# Patient Record
Sex: Male | Born: 1935
Health system: Southern US, Community
[De-identification: ages and names within clinical notes are randomized; demographics above are authoritative.]

## PROBLEM LIST (undated history)

## (undated) DIAGNOSIS — E785 Hyperlipidemia, unspecified: Secondary | ICD-10-CM

## (undated) DIAGNOSIS — Z973 Presence of spectacles and contact lenses: Secondary | ICD-10-CM

## (undated) DIAGNOSIS — M1990 Unspecified osteoarthritis, unspecified site: Secondary | ICD-10-CM

## (undated) DIAGNOSIS — C419 Malignant neoplasm of bone and articular cartilage, unspecified: Secondary | ICD-10-CM

## (undated) DIAGNOSIS — I499 Cardiac arrhythmia, unspecified: Secondary | ICD-10-CM

## (undated) DIAGNOSIS — I4891 Unspecified atrial fibrillation: Secondary | ICD-10-CM

## (undated) DIAGNOSIS — IMO0001 Reserved for inherently not codable concepts without codable children: Secondary | ICD-10-CM

## (undated) DIAGNOSIS — C801 Malignant (primary) neoplasm, unspecified: Secondary | ICD-10-CM

## (undated) DIAGNOSIS — I10 Essential (primary) hypertension: Secondary | ICD-10-CM

## (undated) HISTORY — PX: HERNIA REPAIR: SHX51

## (undated) HISTORY — PX: TONSILLECTOMY: SUR1361

## (undated) HISTORY — PX: COLONOSCOPY: SHX174

---

## 1997-07-21 ENCOUNTER — Ambulatory Visit (HOSPITAL_COMMUNITY): Admission: RE | Admit: 1997-07-21 | Discharge: 1997-07-21 | Payer: Self-pay | Admitting: Radiology

## 1997-11-23 ENCOUNTER — Ambulatory Visit (HOSPITAL_COMMUNITY): Admission: RE | Admit: 1997-11-23 | Discharge: 1997-11-23 | Payer: Self-pay | Admitting: Radiology

## 1997-11-23 ENCOUNTER — Encounter: Payer: Self-pay | Admitting: Radiology

## 1998-04-05 ENCOUNTER — Ambulatory Visit (HOSPITAL_COMMUNITY): Admission: RE | Admit: 1998-04-05 | Discharge: 1998-04-05 | Payer: Self-pay | Admitting: Radiology

## 1998-04-05 ENCOUNTER — Encounter: Payer: Self-pay | Admitting: Radiology

## 1998-08-23 ENCOUNTER — Ambulatory Visit (HOSPITAL_COMMUNITY): Admission: RE | Admit: 1998-08-23 | Discharge: 1998-08-23 | Payer: Self-pay | Admitting: Radiation Oncology

## 1998-08-23 ENCOUNTER — Encounter: Payer: Self-pay | Admitting: Radiation Oncology

## 1998-12-13 ENCOUNTER — Encounter: Payer: Self-pay | Admitting: Radiation Oncology

## 1998-12-13 ENCOUNTER — Ambulatory Visit (HOSPITAL_COMMUNITY): Admission: RE | Admit: 1998-12-13 | Discharge: 1998-12-13 | Payer: Self-pay | Admitting: Radiation Oncology

## 1999-04-18 ENCOUNTER — Ambulatory Visit (HOSPITAL_COMMUNITY): Admission: RE | Admit: 1999-04-18 | Discharge: 1999-04-18 | Payer: Self-pay | Admitting: Radiology

## 1999-04-18 ENCOUNTER — Encounter: Payer: Self-pay | Admitting: Radiation Oncology

## 1999-08-20 ENCOUNTER — Ambulatory Visit (HOSPITAL_COMMUNITY): Admission: RE | Admit: 1999-08-20 | Discharge: 1999-08-20 | Payer: Self-pay | Admitting: Radiology

## 1999-08-20 ENCOUNTER — Encounter: Payer: Self-pay | Admitting: Radiology

## 1999-12-06 ENCOUNTER — Encounter: Payer: Self-pay | Admitting: Radiation Oncology

## 1999-12-06 ENCOUNTER — Ambulatory Visit (HOSPITAL_COMMUNITY): Admission: RE | Admit: 1999-12-06 | Discharge: 1999-12-06 | Payer: Self-pay | Admitting: Radiation Oncology

## 2000-07-01 ENCOUNTER — Ambulatory Visit (HOSPITAL_COMMUNITY): Admission: RE | Admit: 2000-07-01 | Discharge: 2000-07-01 | Payer: Self-pay | Admitting: Gastroenterology

## 2000-07-16 ENCOUNTER — Ambulatory Visit (HOSPITAL_COMMUNITY): Admission: RE | Admit: 2000-07-16 | Discharge: 2000-07-16 | Payer: Self-pay | Admitting: Radiation Oncology

## 2001-02-04 ENCOUNTER — Ambulatory Visit (HOSPITAL_COMMUNITY): Admission: RE | Admit: 2001-02-04 | Discharge: 2001-02-04 | Payer: Self-pay | Admitting: Radiation Oncology

## 2001-02-04 ENCOUNTER — Encounter: Payer: Self-pay | Admitting: Radiation Oncology

## 2001-05-29 ENCOUNTER — Ambulatory Visit (HOSPITAL_COMMUNITY): Admission: RE | Admit: 2001-05-29 | Discharge: 2001-05-29 | Payer: Self-pay | Admitting: Radiation Oncology

## 2001-09-30 ENCOUNTER — Ambulatory Visit (HOSPITAL_COMMUNITY): Admission: RE | Admit: 2001-09-30 | Discharge: 2001-09-30 | Payer: Self-pay | Admitting: Radiation Oncology

## 2002-02-15 ENCOUNTER — Ambulatory Visit (HOSPITAL_COMMUNITY): Admission: RE | Admit: 2002-02-15 | Discharge: 2002-02-15 | Payer: Self-pay | Admitting: Radiation Oncology

## 2002-02-15 ENCOUNTER — Encounter: Payer: Self-pay | Admitting: Radiation Oncology

## 2002-02-17 ENCOUNTER — Encounter: Payer: Self-pay | Admitting: *Deleted

## 2002-02-17 ENCOUNTER — Encounter: Admission: RE | Admit: 2002-02-17 | Discharge: 2002-02-17 | Payer: Self-pay | Admitting: *Deleted

## 2002-02-19 ENCOUNTER — Encounter (INDEPENDENT_AMBULATORY_CARE_PROVIDER_SITE_OTHER): Payer: Self-pay | Admitting: Specialist

## 2002-02-19 ENCOUNTER — Ambulatory Visit (HOSPITAL_BASED_OUTPATIENT_CLINIC_OR_DEPARTMENT_OTHER): Admission: RE | Admit: 2002-02-19 | Discharge: 2002-02-19 | Payer: Self-pay | Admitting: *Deleted

## 2002-05-05 ENCOUNTER — Ambulatory Visit (HOSPITAL_COMMUNITY): Admission: RE | Admit: 2002-05-05 | Discharge: 2002-05-05 | Payer: Self-pay | Admitting: Radiation Oncology

## 2002-09-08 ENCOUNTER — Ambulatory Visit (HOSPITAL_COMMUNITY): Admission: RE | Admit: 2002-09-08 | Discharge: 2002-09-08 | Payer: Self-pay | Admitting: Radiation Oncology

## 2003-01-03 ENCOUNTER — Ambulatory Visit (HOSPITAL_COMMUNITY): Admission: RE | Admit: 2003-01-03 | Discharge: 2003-01-03 | Payer: Self-pay | Admitting: Radiation Oncology

## 2003-01-29 HISTORY — PX: KNEE ARTHROSCOPY: SHX127

## 2003-05-17 ENCOUNTER — Ambulatory Visit (HOSPITAL_COMMUNITY): Admission: RE | Admit: 2003-05-17 | Discharge: 2003-05-17 | Payer: Self-pay | Admitting: Radiation Oncology

## 2003-09-09 ENCOUNTER — Ambulatory Visit (HOSPITAL_COMMUNITY): Admission: RE | Admit: 2003-09-09 | Discharge: 2003-09-09 | Payer: Self-pay | Admitting: *Deleted

## 2004-01-11 ENCOUNTER — Ambulatory Visit (HOSPITAL_COMMUNITY): Admission: RE | Admit: 2004-01-11 | Discharge: 2004-01-11 | Payer: Self-pay | Admitting: Radiology

## 2004-05-15 ENCOUNTER — Ambulatory Visit (HOSPITAL_COMMUNITY): Admission: RE | Admit: 2004-05-15 | Discharge: 2004-05-15 | Payer: Self-pay | Admitting: Radiology

## 2004-09-03 ENCOUNTER — Encounter: Admission: RE | Admit: 2004-09-03 | Discharge: 2004-09-03 | Payer: Self-pay | Admitting: Orthopedic Surgery

## 2004-09-11 ENCOUNTER — Ambulatory Visit (HOSPITAL_COMMUNITY): Admission: RE | Admit: 2004-09-11 | Discharge: 2004-09-11 | Payer: Self-pay | Admitting: Radiology

## 2005-01-07 ENCOUNTER — Ambulatory Visit (HOSPITAL_COMMUNITY): Admission: RE | Admit: 2005-01-07 | Discharge: 2005-01-07 | Payer: Self-pay | Admitting: Radiology

## 2005-05-08 ENCOUNTER — Ambulatory Visit (HOSPITAL_COMMUNITY): Admission: RE | Admit: 2005-05-08 | Discharge: 2005-05-08 | Payer: Self-pay | Admitting: Radiology

## 2005-09-03 ENCOUNTER — Ambulatory Visit (HOSPITAL_COMMUNITY): Admission: RE | Admit: 2005-09-03 | Discharge: 2005-09-03 | Payer: Self-pay | Admitting: Radiology

## 2005-10-31 ENCOUNTER — Inpatient Hospital Stay (HOSPITAL_COMMUNITY): Admission: RE | Admit: 2005-10-31 | Discharge: 2005-11-01 | Payer: Self-pay | Admitting: Urology

## 2006-01-01 ENCOUNTER — Ambulatory Visit (HOSPITAL_COMMUNITY): Admission: RE | Admit: 2006-01-01 | Discharge: 2006-01-01 | Payer: Self-pay | Admitting: Radiology

## 2006-05-01 ENCOUNTER — Ambulatory Visit (HOSPITAL_COMMUNITY): Admission: RE | Admit: 2006-05-01 | Discharge: 2006-05-01 | Payer: Self-pay | Admitting: Radiology

## 2006-09-02 ENCOUNTER — Ambulatory Visit (HOSPITAL_COMMUNITY): Admission: RE | Admit: 2006-09-02 | Discharge: 2006-09-02 | Payer: Self-pay | Admitting: Radiology

## 2007-01-02 ENCOUNTER — Ambulatory Visit (HOSPITAL_COMMUNITY): Admission: RE | Admit: 2007-01-02 | Discharge: 2007-01-02 | Payer: Self-pay | Admitting: Radiology

## 2007-04-30 ENCOUNTER — Ambulatory Visit (HOSPITAL_COMMUNITY): Admission: RE | Admit: 2007-04-30 | Discharge: 2007-04-30 | Payer: Self-pay | Admitting: Internal Medicine

## 2007-05-13 ENCOUNTER — Ambulatory Visit: Payer: Self-pay | Admitting: Thoracic Surgery

## 2007-06-02 ENCOUNTER — Ambulatory Visit (HOSPITAL_COMMUNITY): Admission: RE | Admit: 2007-06-02 | Discharge: 2007-06-02 | Payer: Self-pay | Admitting: Thoracic Surgery

## 2007-06-23 ENCOUNTER — Ambulatory Visit: Payer: Self-pay | Admitting: Thoracic Surgery

## 2007-09-07 ENCOUNTER — Ambulatory Visit (HOSPITAL_COMMUNITY): Admission: RE | Admit: 2007-09-07 | Discharge: 2007-09-07 | Payer: Self-pay | Admitting: Internal Medicine

## 2007-09-16 ENCOUNTER — Ambulatory Visit: Payer: Self-pay | Admitting: Thoracic Surgery

## 2007-12-29 ENCOUNTER — Ambulatory Visit (HOSPITAL_COMMUNITY): Admission: RE | Admit: 2007-12-29 | Discharge: 2007-12-29 | Payer: Self-pay | Admitting: Internal Medicine

## 2008-05-05 ENCOUNTER — Ambulatory Visit (HOSPITAL_COMMUNITY): Admission: RE | Admit: 2008-05-05 | Discharge: 2008-05-05 | Payer: Self-pay | Admitting: Internal Medicine

## 2008-05-12 ENCOUNTER — Encounter (HOSPITAL_COMMUNITY): Admission: RE | Admit: 2008-05-12 | Discharge: 2008-08-10 | Payer: Self-pay | Admitting: Internal Medicine

## 2008-09-07 ENCOUNTER — Ambulatory Visit (HOSPITAL_COMMUNITY): Admission: RE | Admit: 2008-09-07 | Discharge: 2008-09-07 | Payer: Self-pay | Admitting: Internal Medicine

## 2009-01-03 ENCOUNTER — Ambulatory Visit (HOSPITAL_COMMUNITY): Admission: RE | Admit: 2009-01-03 | Discharge: 2009-01-03 | Payer: Self-pay | Admitting: Internal Medicine

## 2009-05-09 ENCOUNTER — Ambulatory Visit (HOSPITAL_COMMUNITY): Admission: RE | Admit: 2009-05-09 | Discharge: 2009-05-09 | Payer: Self-pay | Admitting: Internal Medicine

## 2009-09-06 ENCOUNTER — Ambulatory Visit (HOSPITAL_COMMUNITY): Admission: RE | Admit: 2009-09-06 | Discharge: 2009-09-06 | Payer: Self-pay | Admitting: Internal Medicine

## 2010-01-10 ENCOUNTER — Ambulatory Visit (HOSPITAL_COMMUNITY)
Admission: RE | Admit: 2010-01-10 | Discharge: 2010-01-10 | Payer: Self-pay | Source: Home / Self Care | Attending: Internal Medicine | Admitting: Internal Medicine

## 2010-01-10 ENCOUNTER — Encounter (HOSPITAL_COMMUNITY): Admission: RE | Admit: 2010-01-10 | Payer: Self-pay | Source: Home / Self Care | Admitting: Internal Medicine

## 2010-01-15 ENCOUNTER — Ambulatory Visit (HOSPITAL_COMMUNITY)
Admission: RE | Admit: 2010-01-15 | Discharge: 2010-01-15 | Payer: Self-pay | Source: Home / Self Care | Attending: Radiology | Admitting: Radiology

## 2010-01-28 HISTORY — PX: CYSTOSCOPY PROSTATE W/ LASER: SUR372

## 2010-02-17 ENCOUNTER — Encounter: Payer: Self-pay | Admitting: Radiology

## 2010-02-18 ENCOUNTER — Encounter: Payer: Self-pay | Admitting: Orthopedic Surgery

## 2010-02-19 ENCOUNTER — Encounter: Payer: Self-pay | Admitting: Thoracic Surgery

## 2010-04-18 ENCOUNTER — Other Ambulatory Visit (HOSPITAL_COMMUNITY): Payer: Self-pay | Admitting: Internal Medicine

## 2010-04-18 DIAGNOSIS — C61 Malignant neoplasm of prostate: Secondary | ICD-10-CM

## 2010-05-09 ENCOUNTER — Ambulatory Visit (HOSPITAL_COMMUNITY)
Admission: RE | Admit: 2010-05-09 | Discharge: 2010-05-09 | Disposition: A | Discharge status: Home / Self Care | Payer: Medicare Other | Source: Ambulatory Visit | Attending: Internal Medicine | Admitting: Internal Medicine

## 2010-05-09 ENCOUNTER — Encounter (HOSPITAL_COMMUNITY)
Admission: RE | Admit: 2010-05-09 | Discharge: 2010-05-09 | Disposition: A | Discharge status: Home / Self Care | Payer: Medicare Other | Source: Ambulatory Visit | Attending: Internal Medicine | Admitting: Internal Medicine

## 2010-05-09 ENCOUNTER — Ambulatory Visit (HOSPITAL_COMMUNITY): Payer: Self-pay

## 2010-05-09 DIAGNOSIS — C7951 Secondary malignant neoplasm of bone: Secondary | ICD-10-CM | POA: Insufficient documentation

## 2010-05-09 DIAGNOSIS — Z09 Encounter for follow-up examination after completed treatment for conditions other than malignant neoplasm: Secondary | ICD-10-CM | POA: Insufficient documentation

## 2010-05-09 DIAGNOSIS — C61 Malignant neoplasm of prostate: Secondary | ICD-10-CM | POA: Insufficient documentation

## 2010-05-09 MED ORDER — TECHNETIUM TC 99M MEDRONATE IV KIT
25.0000 | PACK | Freq: Once | INTRAVENOUS | Status: AC | PRN
  Administered 2010-05-09: 25 via INTRAVENOUS

## 2010-05-09 MED ORDER — IOHEXOL 300 MG/ML  SOLN
100.0000 mL | Freq: Once | INTRAMUSCULAR | Status: AC | PRN
  Administered 2010-05-09: 90 mL via INTRAVENOUS

## 2010-06-12 NOTE — Letter (Signed)
September 16, 2007   Hal T. Stoneking, MD  [address] [place], [place] [address]   Re:  HAMEED, KOLAR              DOB:  1929/07/27   Dear Ann Maki,   I saw the patient in the office today and reviewed her CT scan of the  chest and it is improved at least 50% on the right upper lobe lesion  indicative that this is probably inflammatory.  He has another CT scan  scheduled in December 2009 before he sees Dr. Kemper Durie.  We will review  that CT scan and if it is stable, then I will release him back to you  for long-term followup.  His blood pressure is 112/63, pulse 64,  respirations 18, sats were 94%.  I appreciate the opportunity of seeing  the patient.   Sincerely,   Ines Bloomer, M.D.  Electronically Signed   DPB/MEDQ  D:  09/16/2007  T:  09/16/2007  Job:  [id number]

## 2010-06-12 NOTE — Letter (Signed)
May 13, 2007   Hal T. Stoneking, M.D.  [address] [place], [place] [address]   Re:  Jacob, Becker              DOB:  04/09/1929   Dear Ann Maki:   I saw Jacob Becker today.  I appreciate the opportunity of seeing him.  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  Marisue Brooklyn.  He has had followup CT scans.  He does have stable bone  disease and is on hormonal therapy.  On his CT scan done in December  2008, there was approximately an 5- to 6-mm lesion in the anterior  segment of the right upper lobe.  Repeat CT scan done recently showed  that this had increased in size and density, and what was a 5- to 6-mm  nodule was now 9 x 11 x 12 mm with pleural extension and some  spiculation.  His pulmonary function test showed an FVC of 3.977 with an  FEV1 of 3.10, or 86% of predicted, and the patient is a nonsmoker,  having quit in 1980.   His past medical history is significant for hypercholesterolemia,  hypertension.  He had an angioplasty in 1988.   He has no allergies.   His medications include Fosamax 70 mg weekly, Nilandron 150 mg daily,  Maxzide 75/50 daily, selenium 200 mg daily, aspirin 81 mg daily, Flomax  0.4 mg daily, Welchol 625 mg 4 times daily, Lovaza 1000 mcg twice daily,  Toprol 50 mg daily, Crestor 40 mg daily, folic acid 400 mg daily, Altace  10 mg daily, and CQ10 two daily.   SOCIAL HISTORY:  He is married.  Works as an Pensions consultant.  Has 3 children.  Quit smoking in 1980.  Drinks maybe a glass of wine daily.   REVIEW OF SYSTEMS:  He is 6 foot, 1 inch,  and 180 pounds.  CARDIAC:  See past medical history.  No recent angina or atrial fibrillation.  PULMONARY:  No cough, bronchitis or hemoptysis.  GASTROINTESTINAL:  No  nausea, vomiting, constipation, diarrhea.  GENITOURINARY:  No kidney  disease, but see past medical history.  VASCULAR:  No claudication,  DVT,  TIAs.  NEUROLOGIC:  No dizziness, headaches, black-outs, seizure.  MUSCULOSKELETAL:  No arthralgias or joint pain, rash.  PSYCHIATRIC:  No  problems with depression or nervousness.  EYES/ENT:  No changes in  eyesight or hearing.  HEMATOLOGIC:  No problems with bleeding or  clotting disorders.   PHYSICAL EXAMINATION:  GENERAL:  He is a well-developed Caucasian male  in no acute distress.  VITAL SIGNS:  His blood pressure was 147/86, pulse 82, respirations 18,  sats were 95%.  HEENT:  Unremarkable.  NECK:  Supple without thyromegaly.  CHEST:  Clear to auscultation and percussion.  HEART:  Regular sinus rhythm.  No murmurs.  ABDOMEN:  Soft.  There is no hepatosplenomegaly.  EXTREMITIES:  Pulses are 2+.  There is no clubbing or edema.  NEUROLOGIC:  He is oriented x 3.  Sensory and motor intact.  Cranial  nerves intact.   I have discussed the situation with Jacob Becker, and I feel that he  needs to have a PET scan since this has increased in size in just 4  months.  I am very worried that this is a cancer.  It could be an  inflammatory process, but it is less  likely.  It also does not look like  metastatic prostate cancer given the irregularity.  He will be seeing  Dr. Kemper Durie in the next month or so and wants to wait until he sees her  before deciding on any possible surgery.  I did go into great detail  explaining both a VATS procedure on him and possibly a VATS lobectomy  depending on what is found.  I feel the options are either metastatic  prostate cancer, non-small cell lung cancer or possibly some type of  inflammatory condition such as atypical TB or a fungus infection.  We  will give him a call after he gets his PET scan, and then we will see  him back after he returns from [place].   Jacob Becker, M.D.  Electronically Signed   DPB/MEDQ  D:  05/13/2007  T:  05/13/2007  Job:  [id number]   cc:   Maryln Gottron, M.D.  Jonelle Sports. Frazier Richards, M.D.  Dr. Marisue Brooklyn

## 2010-06-12 NOTE — Letter (Signed)
Jun 23, 2007   Hal T. Stoneking, M.D.  [address] [place], [place] [address]   Re:  AZZAM, MEHRA              DOB:  26-Mar-1929   Dear Ann Maki:   I saw [name] back today.  He is doing well.  His blood pressure is  131/80, pulse 72, respirations 18, sats were 94%.  His prostate cancer  is stable and he saw Dr. Marisue Brooklyn at [facility].  He had a PET  scan with an SUV of 2.3 on this right upper lobe lesion which is just  very mildly metabolic.  Dr. Kemper Durie felt that this could be followed and  I agreed with this.   We will plan to see him back again in August with another CT scan.  He  does understand should this get any bigger, that we out to proceed with  a VATS wedge resection with or without seed implantation and node  sampling.  He and his wife agree with this plan.   I appreciate the opportunity to see [name].  He is really a  remarkable person.   Sincerely,   Ines Bloomer, M.D.  Electronically Signed   DPB/MEDQ  D:  06/23/2007  T:  06/23/2007  Job:  [id number]   cc:   Jonelle Sports. Frazier Richards, M.D.

## 2010-06-15 NOTE — Op Note (Signed)
NAME:  Jacob Becker, Jacob Becker                        ACCOUNT NO.:  [id number]   MEDICAL RECORD NO.:  [id number]                   PATIENT TYPE:  AMB   LOCATION:  DSC                                  FACILITY:  [facility]   PHYSICIAN:  Maisie Fus B. Samuella Cota, M.D.               DATE OF BIRTH:  08-Jun-1929   DATE OF PROCEDURE:  02/19/2002  DATE OF DISCHARGE:                                 OPERATIVE REPORT   CCS [phone].   PREOPERATIVE DIAGNOSIS:  Right inguinal hernia.   POSTOPERATIVE DIAGNOSIS:  Right inguinal hernia.   PROCEDURE:  Right inguinal herniorrhaphy with mesh.   SURGEON:  Maisie Fus B. Samuella Cota, M.D.   ANESTHESIA:  Local (1% Xylocaine without epinephrine, 0.25% Marcaine without  epinephrine, and sodium bicarbonate) with anesthesia monitoring,  anesthesiologist Janetta Hora. Frederick, M.D., and C.R.N.A.   DESCRIPTION OF PROCEDURE:  The patient was taken to the operating room and  placed on the table in supine position.  The right lower quadrant was  prepped and draped as a sterile field.  The patient had had a previous left  inguinal hernia repaired through a transverse incision.  A matching  transverse incision was outlined with the skin marker.  A local anesthetic  was used to block the area of the ilioinguinal nerve in the area for the  incision.  Once the ilioinguinal nerve was exposed, it was blocked directly.  Local anesthetic was used throughout as necessary.  The incision was made  through skin and subcutaneous tissue with subcutaneous bleeders being  cauterized with the Bovie or ligated with 3-0 Vicryl.  The external oblique  aponeurosis was divided in the line of its fibers to include the external  ring.  The ilioinguinal nerve was seen and protected laterally.  The cord  structures were isolated with a Penrose drain.  The patient was found to  have a fatty hernia sac coming off medially on the cord structures.  This  was dissected back to the internal ring, and it was coming out  above the  recurrent epigastric vessels, so it was an indirect hernia sac.  The  indirect hernia sac was opened and the fatty tissue coming off medial was  dissected from it.  The hernia sac contained no evidence of a sliding  hernia.  The excess hernia sac was excised and the peritoneum was closed  with a running suture of 0 chromic catgut with the two ends tied to each  other.  The redundant fatty tissue medial was not removed but was reduced.  The inguinal floor was then imbricated with a running suture of 0 chromic  catgut, which also snugged up the internal ring so that only a Kelly clamp  could be inserted along the cord structures at the internal ring.  A piece  of 3 x 6 inch Atrium mesh was fashioned to cover the entire inguinal floor  and extend around the cord structures  superiorly and laterally.  The mesh  was anchored inferiorly with a running suture of 2-0 Novofil with the first  suture placed in the pubic tubercle, the other sutures placed in the  shelving edge of Poupart's ligament.  The mesh was anchored superiorly and  medially with interrupted sutures of 0 Novofil.  A slit was made into the  mesh to accommodate the cord with the internal structures.  The mesh was  then sutured to itself with a 3-0 Vicryl suture.  The internal ring would  admit a Kelly clamp.  The mesh seemed to be covering the inguinal floor  nicely.  The wound was irrigated and no bleeding was noted.  The cord  structures and ilioinguinal nerve were then returned to their normal  anatomical position and the  external oblique aponeurosis was reapproximated  with interrupted sutures of 3-0 Vicryl.  The tip of the little finger could  be inserted along the cord structures at the external ring.  Subcutaneous  tissue irrigated, closed with 3-0 Vicryl, and the skin was closed with a  running subcuticular suture of 4-0 Novofil.  Benzoin and half-inch Steri-  Strips were used to reinforce the skin closure.  A dry  sterile dressing was  applied.  The patient seemed to tolerate the procedure well, was taken to  the PACU in satisfactory condition.                                               Thomas B. Samuella Cota, M.D.    TBP/MEDQ  D:  02/19/2002  T:  02/19/2002  Job:  [id number]   cc:   Lyn Records III, M.D.  [address]  [place]  [place] [address]  Fax: [phone]

## 2010-06-15 NOTE — Assessment & Plan Note (Signed)
OFFICE VISIT   LEGION, DISCHER  DOB:  [birth year]                                        Jun 03, 2007  CHART #:  [id number]   PHONE CALL:  I called Mr. Russ after his PET scan and it showed that  his right upper lobe lesion had an SUV of 2.2, which is borderline  between inflammatory and malignant.  I let him know that this was the  case.   He is going to see Dr. Sheron Nightingale at [facility] next week, and then he  will see me back for further discussion when he returns from seeing her.  My initial impression is that we should wait and see.  I will look  forward to seeing Dr. Catalina Antigua input.   Ines Bloomer, M.D.  Electronically Signed   DPB/MEDQ  D:  06/03/2007  T:  06/03/2007  Job:  [id number]

## 2010-06-15 NOTE — Op Note (Signed)
NAME:  Jacob Becker, Jacob Becker NO.:  [id number]   MEDICAL RECORD NO.:  [id number]          PATIENT TYPE:  INP   LOCATION:  1405                         FACILITY[facility]  [facility]   PHYSICIAN:  Lucrezia Starch. Earlene Plater, M.D.  DATE OF BIRTH:  [birth year]   DATE OF PROCEDURE:  10/30/2005  DATE OF DISCHARGE:                                 OPERATIVE REPORT   PREOPERATIVE DIAGNOSES:  1. Adenocarcinoma of the prostate, status post multiple therapies.  2. Urinary retention.  3. Probable neurogenic bladder.  4. Bilateral hydronephrosis.   POSTOPERATIVE DIAGNOSES:  1. Adenocarcinoma of the prostate, status post multiple therapies.  2. Urinary retention.  3. Probable neurogenic bladder.  4. Bilateral hydronephrosis.   OPERATIVE PROCEDURE:  Cysto transurethral incision of prostate.   SURGEON:  Lucrezia Starch. Earlene Plater, M.D.   ANESTHESIA:  Spinal.   ESTIMATED BLOOD LOSS:  50 mL.   TUBES:  A 24-French, 30 mL balloon, 3-way Foley.   COMPLICATIONS:  None.   INDICATIONS FOR PROCEDURE:  Canyon is a very nice 77 year old white male with  a long history of prostate cancer.  He has undergone multiple treatments.  He had radiation prostate cancer in the mid-90s.  He subsequently is found  to have metastatic disease.  He has undergone experimental protocol that he  felt that was __________.  He has been maintained on hormone ablation and is  also being treated with Lupron and Casodex; and when his PSAs are above 10  he treats with Casodex.  He has subsequently developed obstructive symptoms  despite being on Flomax; and was found on the CT scan to have bilateral  hydronephrosis.  He subsequently underwent urodynamics which revealed a  hyposensitive bladder with loss of appliance and bilateral hydronephrosis.  After understanding the risks, benefits, and alternatives he would like to  proceed with a TUIP.   PROCEDURE IN DETAIL:  The patient was placed in the supine position after  proper spinal anesthesia  and was placed in the dorsal lithotomy position and  was prepped and draped in a sterile fashion.  The urethra was calibrated to  30 [place] with [employer] sounds.  He was noted to have a very firm bladder  neck region.  The 28 French Olympus continuous resectoscope and sheath was  placed over a Timberlake obturator and inspection of the bladder revealed  that it was quite thickened a large capacity, but no specific lesions were  noted.  He did have some diffuse inflammation.  The trigone was noted to be  without significant problems.  The [employer] was placed with the 12-  degree lens and a TUIP was performed in the 12 o'clock position from the  bladder neck to the apex of the prostate, just proximal to the verumontanum;  and the lobes were noted to open well and make the passage of the scope much  easier.  Good hemostasis was obtained with Bovie coagulation cautery; and,  again, the scope was noted to go in easily and from the apex.   The lobes appeared to be well separated.  The bladder was drained.  The  panendoscope was removed.  A 24-French 30 mL balloon 3-way Foley catheter  was passed and the bladder was irrigated clear; and the patient was taken to  the recovery room stable.      Ronald L. Earlene Plater, M.D.  Electronically Signed     RLD/MEDQ  D:  10/30/2005  T:  10/31/2005  Job:  [id number]

## 2010-08-20 ENCOUNTER — Other Ambulatory Visit (HOSPITAL_COMMUNITY): Payer: Self-pay | Admitting: Internal Medicine

## 2010-08-20 DIAGNOSIS — C61 Malignant neoplasm of prostate: Secondary | ICD-10-CM

## 2010-09-17 ENCOUNTER — Ambulatory Visit (HOSPITAL_COMMUNITY)
Admission: RE | Admit: 2010-09-17 | Discharge: 2010-09-17 | Disposition: A | Discharge status: Home / Self Care | Payer: Medicare Other | Source: Ambulatory Visit | Attending: Internal Medicine | Admitting: Internal Medicine

## 2010-09-17 ENCOUNTER — Encounter (HOSPITAL_COMMUNITY): Payer: Self-pay

## 2010-09-17 ENCOUNTER — Other Ambulatory Visit (HOSPITAL_COMMUNITY): Payer: Medicare Other

## 2010-09-17 DIAGNOSIS — N62 Hypertrophy of breast: Secondary | ICD-10-CM | POA: Insufficient documentation

## 2010-09-17 DIAGNOSIS — N281 Cyst of kidney, acquired: Secondary | ICD-10-CM | POA: Insufficient documentation

## 2010-09-17 DIAGNOSIS — I700 Atherosclerosis of aorta: Secondary | ICD-10-CM | POA: Insufficient documentation

## 2010-09-17 DIAGNOSIS — J984 Other disorders of lung: Secondary | ICD-10-CM | POA: Insufficient documentation

## 2010-09-17 DIAGNOSIS — C61 Malignant neoplasm of prostate: Secondary | ICD-10-CM | POA: Insufficient documentation

## 2010-09-17 DIAGNOSIS — C7952 Secondary malignant neoplasm of bone marrow: Secondary | ICD-10-CM | POA: Insufficient documentation

## 2010-09-17 DIAGNOSIS — C7951 Secondary malignant neoplasm of bone: Secondary | ICD-10-CM | POA: Insufficient documentation

## 2010-09-17 HISTORY — DX: Malignant (primary) neoplasm, unspecified: C80.1

## 2010-09-17 MED ORDER — IOHEXOL 300 MG/ML  SOLN
100.0000 mL | Freq: Once | INTRAMUSCULAR | Status: AC | PRN
  Administered 2010-09-17: 100 mL via INTRAVENOUS

## 2010-09-17 MED ORDER — TECHNETIUM TC 99M MEDRONATE IV KIT
25.0000 | PACK | Freq: Once | INTRAVENOUS | Status: AC | PRN
  Administered 2010-09-17: 25 via INTRAVENOUS

## 2010-12-24 ENCOUNTER — Other Ambulatory Visit (HOSPITAL_COMMUNITY): Payer: Self-pay | Admitting: Internal Medicine

## 2010-12-24 DIAGNOSIS — C61 Malignant neoplasm of prostate: Secondary | ICD-10-CM

## 2011-01-03 ENCOUNTER — Ambulatory Visit (HOSPITAL_COMMUNITY)
Admission: RE | Admit: 2011-01-03 | Discharge: 2011-01-03 | Disposition: A | Discharge status: Home / Self Care | Payer: Medicare Other | Source: Ambulatory Visit | Attending: Internal Medicine | Admitting: Internal Medicine

## 2011-01-03 DIAGNOSIS — C7952 Secondary malignant neoplasm of bone marrow: Secondary | ICD-10-CM | POA: Insufficient documentation

## 2011-01-03 DIAGNOSIS — C7951 Secondary malignant neoplasm of bone: Secondary | ICD-10-CM | POA: Insufficient documentation

## 2011-01-03 DIAGNOSIS — C61 Malignant neoplasm of prostate: Secondary | ICD-10-CM

## 2011-01-03 DIAGNOSIS — J984 Other disorders of lung: Secondary | ICD-10-CM | POA: Insufficient documentation

## 2011-01-03 MED ORDER — TECHNETIUM TC 99M MEDRONATE IV KIT
25.0000 | PACK | Freq: Once | INTRAVENOUS | Status: AC | PRN
  Administered 2011-01-03: 25 via INTRAVENOUS

## 2011-01-03 MED ORDER — IOHEXOL 300 MG/ML  SOLN
100.0000 mL | Freq: Once | INTRAMUSCULAR | Status: AC | PRN
  Administered 2011-01-03: 100 mL via INTRAVENOUS

## 2011-02-12 DIAGNOSIS — Z7901 Long term (current) use of anticoagulants: Secondary | ICD-10-CM | POA: Diagnosis not present

## 2011-02-12 DIAGNOSIS — I4891 Unspecified atrial fibrillation: Secondary | ICD-10-CM | POA: Diagnosis not present

## 2011-02-12 DIAGNOSIS — I10 Essential (primary) hypertension: Secondary | ICD-10-CM | POA: Diagnosis not present

## 2011-02-12 DIAGNOSIS — I2510 Atherosclerotic heart disease of native coronary artery without angina pectoris: Secondary | ICD-10-CM | POA: Diagnosis not present

## 2011-02-20 DIAGNOSIS — C4441 Basal cell carcinoma of skin of scalp and neck: Secondary | ICD-10-CM | POA: Diagnosis not present

## 2011-03-04 DIAGNOSIS — C44320 Squamous cell carcinoma of skin of unspecified parts of face: Secondary | ICD-10-CM | POA: Diagnosis not present

## 2011-03-04 DIAGNOSIS — D0439 Carcinoma in situ of skin of other parts of face: Secondary | ICD-10-CM | POA: Diagnosis not present

## 2011-03-06 DIAGNOSIS — B351 Tinea unguium: Secondary | ICD-10-CM | POA: Diagnosis not present

## 2011-03-06 DIAGNOSIS — L84 Corns and callosities: Secondary | ICD-10-CM | POA: Diagnosis not present

## 2011-03-06 DIAGNOSIS — M79609 Pain in unspecified limb: Secondary | ICD-10-CM | POA: Diagnosis not present

## 2011-03-08 DIAGNOSIS — Z8546 Personal history of malignant neoplasm of prostate: Secondary | ICD-10-CM | POA: Diagnosis not present

## 2011-03-11 DIAGNOSIS — C7952 Secondary malignant neoplasm of bone marrow: Secondary | ICD-10-CM | POA: Diagnosis not present

## 2011-03-11 DIAGNOSIS — Z8546 Personal history of malignant neoplasm of prostate: Secondary | ICD-10-CM | POA: Diagnosis not present

## 2011-03-11 DIAGNOSIS — C61 Malignant neoplasm of prostate: Secondary | ICD-10-CM | POA: Diagnosis not present

## 2011-03-11 DIAGNOSIS — C7951 Secondary malignant neoplasm of bone: Secondary | ICD-10-CM | POA: Diagnosis not present

## 2011-03-11 DIAGNOSIS — R972 Elevated prostate specific antigen [PSA]: Secondary | ICD-10-CM | POA: Diagnosis not present

## 2011-03-20 DIAGNOSIS — C44320 Squamous cell carcinoma of skin of unspecified parts of face: Secondary | ICD-10-CM | POA: Diagnosis not present

## 2011-04-01 DIAGNOSIS — C44320 Squamous cell carcinoma of skin of unspecified parts of face: Secondary | ICD-10-CM | POA: Diagnosis not present

## 2011-04-11 ENCOUNTER — Other Ambulatory Visit (HOSPITAL_COMMUNITY): Payer: Self-pay | Admitting: Internal Medicine

## 2011-04-11 DIAGNOSIS — C61 Malignant neoplasm of prostate: Secondary | ICD-10-CM

## 2011-05-07 ENCOUNTER — Ambulatory Visit (HOSPITAL_COMMUNITY)
Admission: RE | Admit: 2011-05-07 | Discharge: 2011-05-07 | Disposition: A | Discharge status: Home / Self Care | Payer: Medicare Other | Source: Ambulatory Visit | Attending: Internal Medicine | Admitting: Internal Medicine

## 2011-05-07 ENCOUNTER — Ambulatory Visit (HOSPITAL_COMMUNITY): Payer: Medicare Other

## 2011-05-07 DIAGNOSIS — C7952 Secondary malignant neoplasm of bone marrow: Secondary | ICD-10-CM | POA: Insufficient documentation

## 2011-05-07 DIAGNOSIS — C7951 Secondary malignant neoplasm of bone: Secondary | ICD-10-CM | POA: Insufficient documentation

## 2011-05-07 DIAGNOSIS — C61 Malignant neoplasm of prostate: Secondary | ICD-10-CM | POA: Diagnosis not present

## 2011-05-07 DIAGNOSIS — R918 Other nonspecific abnormal finding of lung field: Secondary | ICD-10-CM | POA: Diagnosis not present

## 2011-05-07 DIAGNOSIS — M949 Disorder of cartilage, unspecified: Secondary | ICD-10-CM | POA: Diagnosis not present

## 2011-05-07 MED ORDER — IOHEXOL 300 MG/ML  SOLN
80.0000 mL | Freq: Once | INTRAMUSCULAR | Status: AC | PRN
  Administered 2011-05-07: 75 mL via INTRAVENOUS

## 2011-05-07 MED ORDER — TECHNETIUM TC 99M MEDRONATE IV KIT
25.0000 | PACK | Freq: Once | INTRAVENOUS | Status: AC | PRN
  Administered 2011-05-07: 6 via INTRAVENOUS

## 2011-05-10 DIAGNOSIS — Z8546 Personal history of malignant neoplasm of prostate: Secondary | ICD-10-CM | POA: Diagnosis not present

## 2011-05-13 DIAGNOSIS — C7951 Secondary malignant neoplasm of bone: Secondary | ICD-10-CM | POA: Diagnosis not present

## 2011-05-13 DIAGNOSIS — C7952 Secondary malignant neoplasm of bone marrow: Secondary | ICD-10-CM | POA: Diagnosis not present

## 2011-05-13 DIAGNOSIS — Z8546 Personal history of malignant neoplasm of prostate: Secondary | ICD-10-CM | POA: Diagnosis not present

## 2011-05-13 DIAGNOSIS — C4441 Basal cell carcinoma of skin of scalp and neck: Secondary | ICD-10-CM | POA: Diagnosis not present

## 2011-05-13 DIAGNOSIS — E7800 Pure hypercholesterolemia, unspecified: Secondary | ICD-10-CM | POA: Diagnosis not present

## 2011-05-13 DIAGNOSIS — C61 Malignant neoplasm of prostate: Secondary | ICD-10-CM | POA: Diagnosis not present

## 2011-05-13 DIAGNOSIS — N9989 Other postprocedural complications and disorders of genitourinary system: Secondary | ICD-10-CM | POA: Diagnosis not present

## 2011-05-13 DIAGNOSIS — C44319 Basal cell carcinoma of skin of other parts of face: Secondary | ICD-10-CM | POA: Diagnosis not present

## 2011-05-13 DIAGNOSIS — R32 Unspecified urinary incontinence: Secondary | ICD-10-CM | POA: Diagnosis not present

## 2011-05-13 DIAGNOSIS — I499 Cardiac arrhythmia, unspecified: Secondary | ICD-10-CM | POA: Diagnosis not present

## 2011-05-13 DIAGNOSIS — R972 Elevated prostate specific antigen [PSA]: Secondary | ICD-10-CM | POA: Diagnosis not present

## 2011-06-12 DIAGNOSIS — C61 Malignant neoplasm of prostate: Secondary | ICD-10-CM | POA: Diagnosis not present

## 2011-07-03 DIAGNOSIS — C779 Secondary and unspecified malignant neoplasm of lymph node, unspecified: Secondary | ICD-10-CM | POA: Diagnosis not present

## 2011-07-03 DIAGNOSIS — C7951 Secondary malignant neoplasm of bone: Secondary | ICD-10-CM | POA: Diagnosis not present

## 2011-07-03 DIAGNOSIS — C7952 Secondary malignant neoplasm of bone marrow: Secondary | ICD-10-CM | POA: Diagnosis not present

## 2011-07-03 DIAGNOSIS — Z8546 Personal history of malignant neoplasm of prostate: Secondary | ICD-10-CM | POA: Diagnosis not present

## 2011-07-03 DIAGNOSIS — C61 Malignant neoplasm of prostate: Secondary | ICD-10-CM | POA: Diagnosis not present

## 2011-07-03 DIAGNOSIS — R972 Elevated prostate specific antigen [PSA]: Secondary | ICD-10-CM | POA: Diagnosis not present

## 2011-08-08 DIAGNOSIS — H35369 Drusen (degenerative) of macula, unspecified eye: Secondary | ICD-10-CM | POA: Diagnosis not present

## 2011-08-08 DIAGNOSIS — H259 Unspecified age-related cataract: Secondary | ICD-10-CM | POA: Diagnosis not present

## 2011-08-08 DIAGNOSIS — H04129 Dry eye syndrome of unspecified lacrimal gland: Secondary | ICD-10-CM | POA: Diagnosis not present

## 2011-08-13 DIAGNOSIS — Z85828 Personal history of other malignant neoplasm of skin: Secondary | ICD-10-CM | POA: Diagnosis not present

## 2011-08-13 DIAGNOSIS — C44320 Squamous cell carcinoma of skin of unspecified parts of face: Secondary | ICD-10-CM | POA: Diagnosis not present

## 2011-08-13 DIAGNOSIS — L570 Actinic keratosis: Secondary | ICD-10-CM | POA: Diagnosis not present

## 2011-09-04 DIAGNOSIS — Z85828 Personal history of other malignant neoplasm of skin: Secondary | ICD-10-CM | POA: Diagnosis not present

## 2011-09-04 DIAGNOSIS — R972 Elevated prostate specific antigen [PSA]: Secondary | ICD-10-CM | POA: Diagnosis not present

## 2011-09-04 DIAGNOSIS — C61 Malignant neoplasm of prostate: Secondary | ICD-10-CM | POA: Diagnosis not present

## 2011-09-04 DIAGNOSIS — I499 Cardiac arrhythmia, unspecified: Secondary | ICD-10-CM | POA: Diagnosis not present

## 2011-09-04 DIAGNOSIS — C7951 Secondary malignant neoplasm of bone: Secondary | ICD-10-CM | POA: Diagnosis not present

## 2011-09-04 DIAGNOSIS — I10 Essential (primary) hypertension: Secondary | ICD-10-CM | POA: Diagnosis not present

## 2011-09-04 DIAGNOSIS — Z8546 Personal history of malignant neoplasm of prostate: Secondary | ICD-10-CM | POA: Diagnosis not present

## 2011-09-04 DIAGNOSIS — R32 Unspecified urinary incontinence: Secondary | ICD-10-CM | POA: Diagnosis not present

## 2011-09-04 DIAGNOSIS — E7800 Pure hypercholesterolemia, unspecified: Secondary | ICD-10-CM | POA: Diagnosis not present

## 2011-09-04 DIAGNOSIS — R351 Nocturia: Secondary | ICD-10-CM | POA: Diagnosis not present

## 2011-10-02 ENCOUNTER — Other Ambulatory Visit (HOSPITAL_COMMUNITY): Payer: Self-pay | Admitting: Internal Medicine

## 2011-10-02 DIAGNOSIS — C61 Malignant neoplasm of prostate: Secondary | ICD-10-CM

## 2011-10-09 DIAGNOSIS — R310 Gross hematuria: Secondary | ICD-10-CM | POA: Diagnosis not present

## 2011-10-09 DIAGNOSIS — Z792 Long term (current) use of antibiotics: Secondary | ICD-10-CM | POA: Diagnosis not present

## 2011-10-09 DIAGNOSIS — R339 Retention of urine, unspecified: Secondary | ICD-10-CM | POA: Insufficient documentation

## 2011-10-09 DIAGNOSIS — C61 Malignant neoplasm of prostate: Secondary | ICD-10-CM | POA: Diagnosis not present

## 2011-10-10 DIAGNOSIS — R109 Unspecified abdominal pain: Secondary | ICD-10-CM | POA: Diagnosis not present

## 2011-10-10 DIAGNOSIS — T8389XA Other specified complication of genitourinary prosthetic devices, implants and grafts, initial encounter: Secondary | ICD-10-CM | POA: Diagnosis not present

## 2011-10-10 DIAGNOSIS — T83091A Other mechanical complication of indwelling urethral catheter, initial encounter: Secondary | ICD-10-CM | POA: Diagnosis not present

## 2011-10-10 DIAGNOSIS — Z8546 Personal history of malignant neoplasm of prostate: Secondary | ICD-10-CM | POA: Diagnosis not present

## 2011-10-11 DIAGNOSIS — N323 Diverticulum of bladder: Secondary | ICD-10-CM | POA: Diagnosis not present

## 2011-10-11 DIAGNOSIS — E785 Hyperlipidemia, unspecified: Secondary | ICD-10-CM | POA: Diagnosis not present

## 2011-10-11 DIAGNOSIS — I10 Essential (primary) hypertension: Secondary | ICD-10-CM | POA: Diagnosis not present

## 2011-10-11 DIAGNOSIS — I4891 Unspecified atrial fibrillation: Secondary | ICD-10-CM | POA: Diagnosis not present

## 2011-10-11 DIAGNOSIS — R339 Retention of urine, unspecified: Secondary | ICD-10-CM | POA: Diagnosis not present

## 2011-10-11 DIAGNOSIS — C61 Malignant neoplasm of prostate: Secondary | ICD-10-CM | POA: Diagnosis not present

## 2011-10-11 DIAGNOSIS — N3289 Other specified disorders of bladder: Secondary | ICD-10-CM | POA: Diagnosis not present

## 2011-10-11 DIAGNOSIS — N35919 Unspecified urethral stricture, male, unspecified site: Secondary | ICD-10-CM | POA: Diagnosis not present

## 2011-10-17 ENCOUNTER — Ambulatory Visit (HOSPITAL_COMMUNITY): Payer: Medicare Other

## 2011-10-17 ENCOUNTER — Ambulatory Visit (HOSPITAL_COMMUNITY)
Admission: RE | Admit: 2011-10-17 | Discharge: 2011-10-17 | Disposition: A | Discharge status: Home / Self Care | Payer: Medicare Other | Source: Ambulatory Visit | Attending: Internal Medicine | Admitting: Internal Medicine

## 2011-10-17 DIAGNOSIS — R918 Other nonspecific abnormal finding of lung field: Secondary | ICD-10-CM | POA: Insufficient documentation

## 2011-10-17 DIAGNOSIS — J9819 Other pulmonary collapse: Secondary | ICD-10-CM | POA: Insufficient documentation

## 2011-10-17 DIAGNOSIS — C61 Malignant neoplasm of prostate: Secondary | ICD-10-CM | POA: Insufficient documentation

## 2011-10-17 DIAGNOSIS — N62 Hypertrophy of breast: Secondary | ICD-10-CM | POA: Diagnosis not present

## 2011-10-17 DIAGNOSIS — C7951 Secondary malignant neoplasm of bone: Secondary | ICD-10-CM | POA: Diagnosis not present

## 2011-10-17 DIAGNOSIS — K7689 Other specified diseases of liver: Secondary | ICD-10-CM | POA: Insufficient documentation

## 2011-10-17 DIAGNOSIS — M949 Disorder of cartilage, unspecified: Secondary | ICD-10-CM | POA: Diagnosis not present

## 2011-10-17 DIAGNOSIS — I2510 Atherosclerotic heart disease of native coronary artery without angina pectoris: Secondary | ICD-10-CM | POA: Diagnosis not present

## 2011-10-17 DIAGNOSIS — C7952 Secondary malignant neoplasm of bone marrow: Secondary | ICD-10-CM | POA: Diagnosis not present

## 2011-10-17 MED ORDER — IOHEXOL 300 MG/ML  SOLN
80.0000 mL | Freq: Once | INTRAMUSCULAR | Status: AC | PRN
  Administered 2011-10-17: 100 mL via INTRAVENOUS

## 2011-10-17 MED ORDER — TECHNETIUM TC 99M MEDRONATE IV KIT
25.0000 | PACK | Freq: Once | INTRAVENOUS | Status: AC | PRN
  Administered 2011-10-17: 25 via INTRAVENOUS

## 2011-10-28 DIAGNOSIS — N35014 Post-traumatic urethral stricture, male, unspecified: Secondary | ICD-10-CM | POA: Insufficient documentation

## 2011-10-28 DIAGNOSIS — N319 Neuromuscular dysfunction of bladder, unspecified: Secondary | ICD-10-CM | POA: Insufficient documentation

## 2011-10-29 DIAGNOSIS — C7951 Secondary malignant neoplasm of bone: Secondary | ICD-10-CM | POA: Diagnosis not present

## 2011-10-29 DIAGNOSIS — I10 Essential (primary) hypertension: Secondary | ICD-10-CM | POA: Diagnosis not present

## 2011-10-29 DIAGNOSIS — C7952 Secondary malignant neoplasm of bone marrow: Secondary | ICD-10-CM | POA: Diagnosis not present

## 2011-10-29 DIAGNOSIS — Z8546 Personal history of malignant neoplasm of prostate: Secondary | ICD-10-CM | POA: Diagnosis not present

## 2011-10-29 DIAGNOSIS — Z85828 Personal history of other malignant neoplasm of skin: Secondary | ICD-10-CM | POA: Diagnosis not present

## 2011-10-30 DIAGNOSIS — R972 Elevated prostate specific antigen [PSA]: Secondary | ICD-10-CM | POA: Diagnosis not present

## 2011-10-30 DIAGNOSIS — C61 Malignant neoplasm of prostate: Secondary | ICD-10-CM | POA: Diagnosis not present

## 2011-10-30 DIAGNOSIS — Z85828 Personal history of other malignant neoplasm of skin: Secondary | ICD-10-CM | POA: Diagnosis not present

## 2011-10-30 DIAGNOSIS — R5381 Other malaise: Secondary | ICD-10-CM | POA: Diagnosis not present

## 2011-10-30 DIAGNOSIS — C7952 Secondary malignant neoplasm of bone marrow: Secondary | ICD-10-CM | POA: Diagnosis not present

## 2011-10-30 DIAGNOSIS — C7951 Secondary malignant neoplasm of bone: Secondary | ICD-10-CM | POA: Diagnosis not present

## 2011-10-30 DIAGNOSIS — R5383 Other fatigue: Secondary | ICD-10-CM | POA: Diagnosis not present

## 2011-10-30 DIAGNOSIS — Z8546 Personal history of malignant neoplasm of prostate: Secondary | ICD-10-CM | POA: Diagnosis not present

## 2011-11-12 DIAGNOSIS — Z85828 Personal history of other malignant neoplasm of skin: Secondary | ICD-10-CM | POA: Diagnosis not present

## 2011-11-12 DIAGNOSIS — D485 Neoplasm of uncertain behavior of skin: Secondary | ICD-10-CM | POA: Diagnosis not present

## 2011-11-12 DIAGNOSIS — C44320 Squamous cell carcinoma of skin of unspecified parts of face: Secondary | ICD-10-CM | POA: Diagnosis not present

## 2011-11-12 DIAGNOSIS — L570 Actinic keratosis: Secondary | ICD-10-CM | POA: Diagnosis not present

## 2011-11-12 DIAGNOSIS — D044 Carcinoma in situ of skin of scalp and neck: Secondary | ICD-10-CM | POA: Diagnosis not present

## 2012-01-03 DIAGNOSIS — Z85828 Personal history of other malignant neoplasm of skin: Secondary | ICD-10-CM | POA: Diagnosis not present

## 2012-01-03 DIAGNOSIS — Z8546 Personal history of malignant neoplasm of prostate: Secondary | ICD-10-CM | POA: Diagnosis not present

## 2012-01-06 DIAGNOSIS — R5383 Other fatigue: Secondary | ICD-10-CM | POA: Diagnosis not present

## 2012-01-06 DIAGNOSIS — I10 Essential (primary) hypertension: Secondary | ICD-10-CM | POA: Diagnosis not present

## 2012-01-06 DIAGNOSIS — C7952 Secondary malignant neoplasm of bone marrow: Secondary | ICD-10-CM | POA: Diagnosis not present

## 2012-01-06 DIAGNOSIS — Z8546 Personal history of malignant neoplasm of prostate: Secondary | ICD-10-CM | POA: Diagnosis not present

## 2012-01-06 DIAGNOSIS — Z87440 Personal history of urinary (tract) infections: Secondary | ICD-10-CM | POA: Diagnosis not present

## 2012-01-06 DIAGNOSIS — R351 Nocturia: Secondary | ICD-10-CM | POA: Diagnosis not present

## 2012-01-06 DIAGNOSIS — I499 Cardiac arrhythmia, unspecified: Secondary | ICD-10-CM | POA: Diagnosis not present

## 2012-01-06 DIAGNOSIS — N39498 Other specified urinary incontinence: Secondary | ICD-10-CM | POA: Diagnosis not present

## 2012-01-06 DIAGNOSIS — R972 Elevated prostate specific antigen [PSA]: Secondary | ICD-10-CM | POA: Diagnosis not present

## 2012-01-06 DIAGNOSIS — C7951 Secondary malignant neoplasm of bone: Secondary | ICD-10-CM | POA: Diagnosis not present

## 2012-01-06 DIAGNOSIS — N529 Male erectile dysfunction, unspecified: Secondary | ICD-10-CM | POA: Diagnosis not present

## 2012-01-06 DIAGNOSIS — C779 Secondary and unspecified malignant neoplasm of lymph node, unspecified: Secondary | ICD-10-CM | POA: Diagnosis not present

## 2012-01-06 DIAGNOSIS — Z85828 Personal history of other malignant neoplasm of skin: Secondary | ICD-10-CM | POA: Diagnosis not present

## 2012-01-06 DIAGNOSIS — C61 Malignant neoplasm of prostate: Secondary | ICD-10-CM | POA: Diagnosis not present

## 2012-01-31 DIAGNOSIS — R5383 Other fatigue: Secondary | ICD-10-CM | POA: Diagnosis not present

## 2012-01-31 DIAGNOSIS — E782 Mixed hyperlipidemia: Secondary | ICD-10-CM | POA: Diagnosis not present

## 2012-01-31 DIAGNOSIS — I4891 Unspecified atrial fibrillation: Secondary | ICD-10-CM | POA: Diagnosis not present

## 2012-01-31 DIAGNOSIS — I10 Essential (primary) hypertension: Secondary | ICD-10-CM | POA: Diagnosis not present

## 2012-01-31 DIAGNOSIS — Z7901 Long term (current) use of anticoagulants: Secondary | ICD-10-CM | POA: Diagnosis not present

## 2012-01-31 DIAGNOSIS — I2510 Atherosclerotic heart disease of native coronary artery without angina pectoris: Secondary | ICD-10-CM | POA: Diagnosis not present

## 2012-02-19 DIAGNOSIS — C44320 Squamous cell carcinoma of skin of unspecified parts of face: Secondary | ICD-10-CM | POA: Diagnosis not present

## 2012-02-25 ENCOUNTER — Other Ambulatory Visit (HOSPITAL_COMMUNITY): Payer: Self-pay | Admitting: Internal Medicine

## 2012-02-25 DIAGNOSIS — C61 Malignant neoplasm of prostate: Secondary | ICD-10-CM

## 2012-02-26 DIAGNOSIS — C4442 Squamous cell carcinoma of skin of scalp and neck: Secondary | ICD-10-CM | POA: Diagnosis not present

## 2012-02-26 DIAGNOSIS — D044 Carcinoma in situ of skin of scalp and neck: Secondary | ICD-10-CM | POA: Diagnosis not present

## 2012-02-26 DIAGNOSIS — L570 Actinic keratosis: Secondary | ICD-10-CM | POA: Diagnosis not present

## 2012-03-10 ENCOUNTER — Ambulatory Visit (HOSPITAL_COMMUNITY): Payer: Medicare Other

## 2012-03-10 ENCOUNTER — Encounter (HOSPITAL_COMMUNITY): Payer: Medicare Other

## 2012-03-11 ENCOUNTER — Encounter (HOSPITAL_COMMUNITY)
Admission: RE | Admit: 2012-03-11 | Discharge: 2012-03-11 | Disposition: A | Discharge status: Home / Self Care | Payer: Medicare Other | Source: Ambulatory Visit | Attending: Internal Medicine | Admitting: Internal Medicine

## 2012-03-11 ENCOUNTER — Ambulatory Visit (HOSPITAL_COMMUNITY)
Admission: RE | Admit: 2012-03-11 | Discharge: 2012-03-11 | Disposition: A | Discharge status: Home / Self Care | Payer: Medicare Other | Source: Ambulatory Visit | Attending: Internal Medicine | Admitting: Internal Medicine

## 2012-03-11 ENCOUNTER — Encounter (HOSPITAL_COMMUNITY): Payer: Self-pay

## 2012-03-11 DIAGNOSIS — C7951 Secondary malignant neoplasm of bone: Secondary | ICD-10-CM | POA: Insufficient documentation

## 2012-03-11 DIAGNOSIS — C61 Malignant neoplasm of prostate: Secondary | ICD-10-CM

## 2012-03-11 DIAGNOSIS — J9819 Other pulmonary collapse: Secondary | ICD-10-CM | POA: Diagnosis not present

## 2012-03-11 DIAGNOSIS — C7952 Secondary malignant neoplasm of bone marrow: Secondary | ICD-10-CM | POA: Insufficient documentation

## 2012-03-11 DIAGNOSIS — R918 Other nonspecific abnormal finding of lung field: Secondary | ICD-10-CM | POA: Diagnosis not present

## 2012-03-11 DIAGNOSIS — R911 Solitary pulmonary nodule: Secondary | ICD-10-CM | POA: Diagnosis not present

## 2012-03-11 DIAGNOSIS — K7689 Other specified diseases of liver: Secondary | ICD-10-CM | POA: Diagnosis not present

## 2012-03-11 MED ORDER — IOHEXOL 300 MG/ML  SOLN
100.0000 mL | Freq: Once | INTRAMUSCULAR | Status: AC | PRN
  Administered 2012-03-11: 100 mL via INTRAVENOUS

## 2012-03-11 MED ORDER — TECHNETIUM TC 99M MEDRONATE IV KIT
25.0000 | PACK | Freq: Once | INTRAVENOUS | Status: AC | PRN
  Administered 2012-03-11: 25 via INTRAVENOUS

## 2012-03-17 DIAGNOSIS — L570 Actinic keratosis: Secondary | ICD-10-CM | POA: Diagnosis not present

## 2012-03-17 DIAGNOSIS — Z85828 Personal history of other malignant neoplasm of skin: Secondary | ICD-10-CM | POA: Diagnosis not present

## 2012-03-23 DIAGNOSIS — R972 Elevated prostate specific antigen [PSA]: Secondary | ICD-10-CM | POA: Diagnosis not present

## 2012-03-23 DIAGNOSIS — Z85828 Personal history of other malignant neoplasm of skin: Secondary | ICD-10-CM | POA: Diagnosis not present

## 2012-03-23 DIAGNOSIS — C61 Malignant neoplasm of prostate: Secondary | ICD-10-CM | POA: Diagnosis not present

## 2012-03-23 DIAGNOSIS — Z8546 Personal history of malignant neoplasm of prostate: Secondary | ICD-10-CM | POA: Diagnosis not present

## 2012-03-23 DIAGNOSIS — C779 Secondary and unspecified malignant neoplasm of lymph node, unspecified: Secondary | ICD-10-CM | POA: Diagnosis not present

## 2012-03-23 DIAGNOSIS — C7952 Secondary malignant neoplasm of bone marrow: Secondary | ICD-10-CM | POA: Diagnosis not present

## 2012-03-23 DIAGNOSIS — C7951 Secondary malignant neoplasm of bone: Secondary | ICD-10-CM | POA: Diagnosis not present

## 2012-05-18 DIAGNOSIS — C61 Malignant neoplasm of prostate: Secondary | ICD-10-CM | POA: Diagnosis not present

## 2012-05-18 DIAGNOSIS — I10 Essential (primary) hypertension: Secondary | ICD-10-CM | POA: Diagnosis not present

## 2012-05-18 DIAGNOSIS — C7951 Secondary malignant neoplasm of bone: Secondary | ICD-10-CM | POA: Diagnosis not present

## 2012-05-18 DIAGNOSIS — E7800 Pure hypercholesterolemia, unspecified: Secondary | ICD-10-CM | POA: Diagnosis not present

## 2012-05-18 DIAGNOSIS — C779 Secondary and unspecified malignant neoplasm of lymph node, unspecified: Secondary | ICD-10-CM | POA: Diagnosis not present

## 2012-05-18 DIAGNOSIS — I4891 Unspecified atrial fibrillation: Secondary | ICD-10-CM | POA: Diagnosis not present

## 2012-05-18 DIAGNOSIS — Z85828 Personal history of other malignant neoplasm of skin: Secondary | ICD-10-CM | POA: Diagnosis not present

## 2012-05-18 DIAGNOSIS — R972 Elevated prostate specific antigen [PSA]: Secondary | ICD-10-CM | POA: Diagnosis not present

## 2012-05-18 DIAGNOSIS — R351 Nocturia: Secondary | ICD-10-CM | POA: Diagnosis not present

## 2012-05-18 DIAGNOSIS — Z8546 Personal history of malignant neoplasm of prostate: Secondary | ICD-10-CM | POA: Diagnosis not present

## 2012-05-18 DIAGNOSIS — C7952 Secondary malignant neoplasm of bone marrow: Secondary | ICD-10-CM | POA: Diagnosis not present

## 2012-06-02 DIAGNOSIS — L570 Actinic keratosis: Secondary | ICD-10-CM | POA: Diagnosis not present

## 2012-06-02 DIAGNOSIS — L821 Other seborrheic keratosis: Secondary | ICD-10-CM | POA: Diagnosis not present

## 2012-06-02 DIAGNOSIS — Z85828 Personal history of other malignant neoplasm of skin: Secondary | ICD-10-CM | POA: Diagnosis not present

## 2012-06-02 DIAGNOSIS — D1801 Hemangioma of skin and subcutaneous tissue: Secondary | ICD-10-CM | POA: Diagnosis not present

## 2012-06-25 ENCOUNTER — Other Ambulatory Visit (HOSPITAL_COMMUNITY): Payer: Self-pay | Admitting: Internal Medicine

## 2012-06-25 DIAGNOSIS — C61 Malignant neoplasm of prostate: Secondary | ICD-10-CM

## 2012-07-08 ENCOUNTER — Encounter (HOSPITAL_COMMUNITY): Payer: Self-pay

## 2012-07-08 ENCOUNTER — Ambulatory Visit (HOSPITAL_COMMUNITY)
Admission: RE | Admit: 2012-07-08 | Discharge: 2012-07-08 | Disposition: A | Discharge status: Home / Self Care | Payer: Medicare Other | Source: Ambulatory Visit | Attending: Internal Medicine | Admitting: Internal Medicine

## 2012-07-08 ENCOUNTER — Encounter (HOSPITAL_COMMUNITY)
Admission: RE | Admit: 2012-07-08 | Discharge: 2012-07-08 | Disposition: A | Discharge status: Home / Self Care | Payer: Medicare Other | Source: Ambulatory Visit | Attending: Internal Medicine | Admitting: Internal Medicine

## 2012-07-08 DIAGNOSIS — R911 Solitary pulmonary nodule: Secondary | ICD-10-CM | POA: Diagnosis not present

## 2012-07-08 DIAGNOSIS — C61 Malignant neoplasm of prostate: Secondary | ICD-10-CM | POA: Diagnosis not present

## 2012-07-08 DIAGNOSIS — C7951 Secondary malignant neoplasm of bone: Secondary | ICD-10-CM | POA: Diagnosis not present

## 2012-07-08 DIAGNOSIS — K7689 Other specified diseases of liver: Secondary | ICD-10-CM | POA: Diagnosis not present

## 2012-07-08 DIAGNOSIS — C7952 Secondary malignant neoplasm of bone marrow: Secondary | ICD-10-CM | POA: Diagnosis not present

## 2012-07-08 MED ORDER — IOHEXOL 300 MG/ML  SOLN
100.0000 mL | Freq: Once | INTRAMUSCULAR | Status: AC | PRN
  Administered 2012-07-08: 100 mL via INTRAVENOUS

## 2012-07-15 DIAGNOSIS — Z85828 Personal history of other malignant neoplasm of skin: Secondary | ICD-10-CM | POA: Diagnosis not present

## 2012-07-15 DIAGNOSIS — R351 Nocturia: Secondary | ICD-10-CM | POA: Diagnosis not present

## 2012-07-15 DIAGNOSIS — R5381 Other malaise: Secondary | ICD-10-CM | POA: Diagnosis not present

## 2012-07-15 DIAGNOSIS — R5383 Other fatigue: Secondary | ICD-10-CM | POA: Diagnosis not present

## 2012-07-15 DIAGNOSIS — R972 Elevated prostate specific antigen [PSA]: Secondary | ICD-10-CM | POA: Diagnosis not present

## 2012-07-15 DIAGNOSIS — C61 Malignant neoplasm of prostate: Secondary | ICD-10-CM | POA: Diagnosis not present

## 2012-07-15 DIAGNOSIS — Z8546 Personal history of malignant neoplasm of prostate: Secondary | ICD-10-CM | POA: Diagnosis not present

## 2012-07-15 DIAGNOSIS — C7951 Secondary malignant neoplasm of bone: Secondary | ICD-10-CM | POA: Diagnosis not present

## 2012-07-15 DIAGNOSIS — C7952 Secondary malignant neoplasm of bone marrow: Secondary | ICD-10-CM | POA: Diagnosis not present

## 2012-07-21 DIAGNOSIS — Z0000 Encounter for general adult medical examination without abnormal findings: Secondary | ICD-10-CM | POA: Diagnosis not present

## 2012-07-21 DIAGNOSIS — Z1331 Encounter for screening for depression: Secondary | ICD-10-CM | POA: Diagnosis not present

## 2012-08-26 DIAGNOSIS — E782 Mixed hyperlipidemia: Secondary | ICD-10-CM | POA: Diagnosis not present

## 2012-08-26 DIAGNOSIS — Z79899 Other long term (current) drug therapy: Secondary | ICD-10-CM | POA: Diagnosis not present

## 2012-09-14 DIAGNOSIS — C61 Malignant neoplasm of prostate: Secondary | ICD-10-CM | POA: Diagnosis not present

## 2012-09-14 DIAGNOSIS — R972 Elevated prostate specific antigen [PSA]: Secondary | ICD-10-CM | POA: Diagnosis not present

## 2012-09-14 DIAGNOSIS — C779 Secondary and unspecified malignant neoplasm of lymph node, unspecified: Secondary | ICD-10-CM | POA: Diagnosis not present

## 2012-09-14 DIAGNOSIS — C7951 Secondary malignant neoplasm of bone: Secondary | ICD-10-CM | POA: Diagnosis not present

## 2012-09-14 DIAGNOSIS — Z85828 Personal history of other malignant neoplasm of skin: Secondary | ICD-10-CM | POA: Diagnosis not present

## 2012-09-14 DIAGNOSIS — R5381 Other malaise: Secondary | ICD-10-CM | POA: Diagnosis not present

## 2012-09-14 DIAGNOSIS — Z8546 Personal history of malignant neoplasm of prostate: Secondary | ICD-10-CM | POA: Diagnosis not present

## 2012-09-30 ENCOUNTER — Other Ambulatory Visit (HOSPITAL_COMMUNITY): Payer: Self-pay | Admitting: Internal Medicine

## 2012-09-30 DIAGNOSIS — N412 Abscess of prostate: Secondary | ICD-10-CM

## 2012-09-30 DIAGNOSIS — C61 Malignant neoplasm of prostate: Secondary | ICD-10-CM

## 2012-09-30 DIAGNOSIS — C801 Malignant (primary) neoplasm, unspecified: Secondary | ICD-10-CM

## 2012-10-23 DIAGNOSIS — C44320 Squamous cell carcinoma of skin of unspecified parts of face: Secondary | ICD-10-CM | POA: Diagnosis not present

## 2012-10-23 DIAGNOSIS — C44211 Basal cell carcinoma of skin of unspecified ear and external auricular canal: Secondary | ICD-10-CM | POA: Diagnosis not present

## 2012-10-23 DIAGNOSIS — C44111 Basal cell carcinoma of skin of unspecified eyelid, including canthus: Secondary | ICD-10-CM | POA: Diagnosis not present

## 2012-10-23 DIAGNOSIS — D0420 Carcinoma in situ of skin of unspecified ear and external auricular canal: Secondary | ICD-10-CM | POA: Diagnosis not present

## 2012-10-23 DIAGNOSIS — D485 Neoplasm of uncertain behavior of skin: Secondary | ICD-10-CM | POA: Diagnosis not present

## 2012-10-23 DIAGNOSIS — Z85828 Personal history of other malignant neoplasm of skin: Secondary | ICD-10-CM | POA: Diagnosis not present

## 2012-10-23 DIAGNOSIS — L570 Actinic keratosis: Secondary | ICD-10-CM | POA: Diagnosis not present

## 2012-10-27 ENCOUNTER — Encounter (HOSPITAL_COMMUNITY)
Admission: RE | Admit: 2012-10-27 | Discharge: 2012-10-27 | Disposition: A | Discharge status: Home / Self Care | Payer: Medicare Other | Source: Ambulatory Visit | Attending: Internal Medicine | Admitting: Internal Medicine

## 2012-10-27 ENCOUNTER — Ambulatory Visit (HOSPITAL_COMMUNITY)
Admission: RE | Admit: 2012-10-27 | Discharge: 2012-10-27 | Disposition: A | Discharge status: Home / Self Care | Payer: Medicare Other | Source: Ambulatory Visit | Attending: Internal Medicine | Admitting: Internal Medicine

## 2012-10-27 DIAGNOSIS — K7689 Other specified diseases of liver: Secondary | ICD-10-CM | POA: Diagnosis not present

## 2012-10-27 DIAGNOSIS — J984 Other disorders of lung: Secondary | ICD-10-CM | POA: Diagnosis not present

## 2012-10-27 DIAGNOSIS — C61 Malignant neoplasm of prostate: Secondary | ICD-10-CM | POA: Diagnosis not present

## 2012-10-27 DIAGNOSIS — C7951 Secondary malignant neoplasm of bone: Secondary | ICD-10-CM | POA: Diagnosis not present

## 2012-10-27 DIAGNOSIS — R911 Solitary pulmonary nodule: Secondary | ICD-10-CM | POA: Diagnosis not present

## 2012-10-27 DIAGNOSIS — N62 Hypertrophy of breast: Secondary | ICD-10-CM | POA: Diagnosis not present

## 2012-10-27 MED ORDER — IOHEXOL 300 MG/ML  SOLN
75.0000 mL | Freq: Once | INTRAMUSCULAR | Status: AC | PRN
  Administered 2012-10-27: 75 mL via INTRAVENOUS

## 2012-10-27 MED ORDER — TECHNETIUM TC 99M MEDRONATE IV KIT
26.0000 | PACK | Freq: Once | INTRAVENOUS | Status: AC | PRN
  Administered 2012-10-27: 26 via INTRAVENOUS

## 2012-11-02 DIAGNOSIS — E7800 Pure hypercholesterolemia, unspecified: Secondary | ICD-10-CM | POA: Diagnosis not present

## 2012-11-02 DIAGNOSIS — R32 Unspecified urinary incontinence: Secondary | ICD-10-CM | POA: Diagnosis not present

## 2012-11-02 DIAGNOSIS — R351 Nocturia: Secondary | ICD-10-CM | POA: Diagnosis not present

## 2012-11-02 DIAGNOSIS — R972 Elevated prostate specific antigen [PSA]: Secondary | ICD-10-CM | POA: Diagnosis not present

## 2012-11-02 DIAGNOSIS — I10 Essential (primary) hypertension: Secondary | ICD-10-CM | POA: Diagnosis not present

## 2012-11-02 DIAGNOSIS — Z85828 Personal history of other malignant neoplasm of skin: Secondary | ICD-10-CM | POA: Diagnosis not present

## 2012-11-02 DIAGNOSIS — Z23 Encounter for immunization: Secondary | ICD-10-CM | POA: Diagnosis not present

## 2012-11-02 DIAGNOSIS — Z8546 Personal history of malignant neoplasm of prostate: Secondary | ICD-10-CM | POA: Diagnosis not present

## 2012-11-02 DIAGNOSIS — C7951 Secondary malignant neoplasm of bone: Secondary | ICD-10-CM | POA: Diagnosis not present

## 2012-11-02 DIAGNOSIS — I499 Cardiac arrhythmia, unspecified: Secondary | ICD-10-CM | POA: Diagnosis not present

## 2012-11-02 DIAGNOSIS — C61 Malignant neoplasm of prostate: Secondary | ICD-10-CM | POA: Diagnosis not present

## 2012-11-11 ENCOUNTER — Telehealth: Payer: Self-pay

## 2012-11-11 ENCOUNTER — Other Ambulatory Visit: Payer: Self-pay | Admitting: Interventional Cardiology

## 2012-11-11 MED ORDER — NITROGLYCERIN 0.4 MG SL SUBL: 0.4000 mg | SUBLINGUAL_TABLET | SUBLINGUAL | Status: DC | PRN

## 2012-11-11 NOTE — Telephone Encounter (Signed)
done

## 2013-01-04 DIAGNOSIS — I10 Essential (primary) hypertension: Secondary | ICD-10-CM | POA: Diagnosis not present

## 2013-01-04 DIAGNOSIS — Z85828 Personal history of other malignant neoplasm of skin: Secondary | ICD-10-CM | POA: Diagnosis not present

## 2013-01-04 DIAGNOSIS — E7800 Pure hypercholesterolemia, unspecified: Secondary | ICD-10-CM | POA: Diagnosis not present

## 2013-01-04 DIAGNOSIS — R32 Unspecified urinary incontinence: Secondary | ICD-10-CM | POA: Diagnosis not present

## 2013-01-04 DIAGNOSIS — R351 Nocturia: Secondary | ICD-10-CM | POA: Diagnosis not present

## 2013-01-04 DIAGNOSIS — C61 Malignant neoplasm of prostate: Secondary | ICD-10-CM | POA: Diagnosis not present

## 2013-01-04 DIAGNOSIS — I4891 Unspecified atrial fibrillation: Secondary | ICD-10-CM | POA: Diagnosis not present

## 2013-01-04 DIAGNOSIS — I499 Cardiac arrhythmia, unspecified: Secondary | ICD-10-CM | POA: Diagnosis not present

## 2013-01-04 DIAGNOSIS — Z8546 Personal history of malignant neoplasm of prostate: Secondary | ICD-10-CM | POA: Diagnosis not present

## 2013-01-04 DIAGNOSIS — R972 Elevated prostate specific antigen [PSA]: Secondary | ICD-10-CM | POA: Diagnosis not present

## 2013-01-04 DIAGNOSIS — C7951 Secondary malignant neoplasm of bone: Secondary | ICD-10-CM | POA: Diagnosis not present

## 2013-01-05 ENCOUNTER — Telehealth: Payer: Self-pay | Admitting: Interventional Cardiology

## 2013-01-05 MED ORDER — AMLODIPINE BESYLATE 5 MG PO TABS: 5.0000 mg | ORAL_TABLET | Freq: Every day | ORAL | Status: DC

## 2013-01-05 MED ORDER — TRIAMTERENE-HCTZ 37.5-25 MG PO CAPS: ORAL_CAPSULE | ORAL | Status: DC

## 2013-01-05 NOTE — Telephone Encounter (Signed)
pt wife sts that he is not out of medication. just that there are no more refills left on medications.adv her that refills have been sent to [facility] and pt needs to schedule an appt for futher refills.she rqst I call pt directly at work [phone] for pt to schedule an appt

## 2013-01-05 NOTE — Telephone Encounter (Signed)
New problem    Pt's wife called about a letter from [employer]	[place]. To refill AMLODIPINE 5 mg 90days & TRIAMT/HCTV 35.5-25 mg 180 tabs.  Pt is out.  I you have any questions give pt a call.

## 2013-01-06 DIAGNOSIS — C44320 Squamous cell carcinoma of skin of unspecified parts of face: Secondary | ICD-10-CM | POA: Diagnosis not present

## 2013-01-06 DIAGNOSIS — Z85828 Personal history of other malignant neoplasm of skin: Secondary | ICD-10-CM | POA: Diagnosis not present

## 2013-01-06 NOTE — Telephone Encounter (Signed)
lmom for pt to return the call.pt needs to schedule an appt

## 2013-01-13 DIAGNOSIS — C44211 Basal cell carcinoma of skin of unspecified ear and external auricular canal: Secondary | ICD-10-CM | POA: Diagnosis not present

## 2013-01-13 DIAGNOSIS — Z85828 Personal history of other malignant neoplasm of skin: Secondary | ICD-10-CM | POA: Diagnosis not present

## 2013-01-14 ENCOUNTER — Ambulatory Visit (INDEPENDENT_AMBULATORY_CARE_PROVIDER_SITE_OTHER): Payer: Medicare Other | Admitting: Podiatry

## 2013-01-14 DIAGNOSIS — B351 Tinea unguium: Secondary | ICD-10-CM | POA: Diagnosis not present

## 2013-01-14 DIAGNOSIS — L84 Corns and callosities: Secondary | ICD-10-CM

## 2013-01-14 NOTE — Progress Notes (Signed)
Subjective:     Patient ID: Jacob Becker, male   DOB: 05/10/1929, 79 y.o.   MRN: [id number]  HPI patient presents with nail disease and distal keratotic lesions of the third toe   Review of Systems     Objective:   Physical Exam Neurovascular status intact with thick nails and keratotic tissue formation    Assessment:     Chronic mycotic nail infections and lesion    Plan:     Debridement of nails and lesions accomplished today

## 2013-01-18 ENCOUNTER — Other Ambulatory Visit: Payer: Self-pay | Admitting: Interventional Cardiology

## 2013-02-01 DIAGNOSIS — N319 Neuromuscular dysfunction of bladder, unspecified: Secondary | ICD-10-CM | POA: Diagnosis not present

## 2013-02-01 DIAGNOSIS — N35919 Unspecified urethral stricture, male, unspecified site: Secondary | ICD-10-CM | POA: Diagnosis not present

## 2013-02-01 DIAGNOSIS — C61 Malignant neoplasm of prostate: Secondary | ICD-10-CM | POA: Diagnosis not present

## 2013-02-05 ENCOUNTER — Other Ambulatory Visit (HOSPITAL_COMMUNITY): Payer: Self-pay | Admitting: Internal Medicine

## 2013-02-05 ENCOUNTER — Other Ambulatory Visit (HOSPITAL_COMMUNITY): Payer: Self-pay | Admitting: *Deleted

## 2013-02-05 DIAGNOSIS — C61 Malignant neoplasm of prostate: Secondary | ICD-10-CM

## 2013-02-05 DIAGNOSIS — C801 Malignant (primary) neoplasm, unspecified: Secondary | ICD-10-CM

## 2013-02-10 ENCOUNTER — Other Ambulatory Visit (HOSPITAL_COMMUNITY): Payer: Self-pay | Admitting: Internal Medicine

## 2013-02-10 DIAGNOSIS — C61 Malignant neoplasm of prostate: Secondary | ICD-10-CM

## 2013-02-11 ENCOUNTER — Ambulatory Visit (INDEPENDENT_AMBULATORY_CARE_PROVIDER_SITE_OTHER): Payer: Medicare Other | Admitting: Interventional Cardiology

## 2013-02-11 ENCOUNTER — Encounter: Payer: Self-pay | Admitting: Interventional Cardiology

## 2013-02-11 VITALS — BP 122/78 | HR 86 | Ht 72.0 in | Wt 197.0 lb

## 2013-02-11 DIAGNOSIS — I4891 Unspecified atrial fibrillation: Secondary | ICD-10-CM | POA: Diagnosis not present

## 2013-02-11 MED ORDER — AMLODIPINE BESYLATE 5 MG PO TABS: 5.0000 mg | ORAL_TABLET | Freq: Every day | ORAL | Status: DC

## 2013-02-11 MED ORDER — METOPROLOL SUCCINATE ER 100 MG PO TB24: 100.0000 mg | ORAL_TABLET | Freq: Two times a day (BID) | ORAL | Status: DC

## 2013-02-11 MED ORDER — TRIAMTERENE-HCTZ 37.5-25 MG PO CAPS: 1.0000 | ORAL_CAPSULE | Freq: Every day | ORAL | Status: DC

## 2013-02-11 MED ORDER — RAMIPRIL 10 MG PO CAPS: 10.0000 mg | ORAL_CAPSULE | Freq: Two times a day (BID) | ORAL | Status: DC

## 2013-02-11 NOTE — Patient Instructions (Signed)
Your physician recommends that you continue on your current medications as directed. Please refer to the Current Medication list given to you today.  Refills for all your cardiac related medications have been sent to your pharmacy  Your physician wants you to follow-up in: 6 months You will receive a reminder letter in the mail two months in advance. If you don't receive a letter, please call our office to schedule the follow-up appointment.

## 2013-02-11 NOTE — Progress Notes (Signed)
Patient ID: Jacob Becker, male   DOB: [birth year], 80 y.o.   MRN: [id number] Medical History: Hypertension, prostate cancer diagnosed 1994 [facility] also Dr. Lawerance Bach, coronary artery disease LAD PTCA 1988, hypercholesterolemia goal LDL less than 70, Colon polyps, renal disease stage II, atrial fibrillation-Dr. Tamala Julian, Bladder dysfunction - self cath, On fosamax since 2006, derm Dr Jarome Matin, opthal Dr Kathrin Penner, Thrombocytopenia platelet count 133,000 on 12/20 11 repeat in one month.     [facility]. [address], [place]  [address] Phone: [phone] Fax:  [phone]  Date:  02/11/2013   ID:  Jacob Becker, DOB 07/21/1929, MRN [id number]  PCP:  Mathews Argyle, MD   ASSESSMENT:  1. Atrial fibrillation, chronic with good rate control 2. History of chronic coronary artery disease but without angina. Remote history of PCI without stenting in the LAD territory 3. Hypertension, controlled 4. Chronic anticoagulation therapy on Xarelto  PLAN:  1. clinical followup in 6 months  2. No change in therapy but will refill all cardiovascular medicines 4 year  3. Continue aerobic exercise and cautioned to call if angina, dyspnea, swelling, or syncope.   SUBJECTIVE: Jacob Becker is a 80 y.o. male doing well without cardiopulmonary complaints. No palpitations, bleeding, transient neurological symptoms.   Wt Readings from Last 3 Encounters:  02/11/13 197 lb (89.359 kg)     Past Medical History  Diagnosis Date  . Cancer     Current Outpatient Prescriptions  Medication Sig Dispense Refill  . amLODipine (NORVASC) 5 MG tablet Take 1 tablet (5 mg total) by mouth daily.  90 tablet  0  . Calcium-Magnesium-Vitamin D (CALCIUM 500 PO) Take 500 mg by mouth 2 (two) times daily.      . Coenzyme Q10 (CO Q 10) 100 MG CAPS Take 100 mg by mouth daily.      . colesevelam (WELCHOL) 625 MG tablet Take 625 mg by mouth 2 (two) times daily with a meal.      . enzalutamide  (XTANDI) 40 MG capsule Take 160 mg by mouth daily.      . metoprolol succinate (TOPROL-XL) 100 MG 24 hr tablet Take 100 mg by mouth 2 (two) times daily. Take with or immediately following a meal.      . Multiple Vitamin (MULTIVITAMIN) capsule Take 1 capsule by mouth daily.      . nitroGLYCERIN (NITROSTAT) 0.4 MG SL tablet Place 1 tablet (0.4 mg total) under the tongue every 5 (five) minutes as needed for chest pain.  25 tablet  0  . Omega-3 Fatty Acids (FISH OIL PO) Take 1,000 mg by mouth daily.      . ramipril (ALTACE) 10 MG capsule Take 10 mg by mouth 2 (two) times daily.       . rosuvastatin (CRESTOR) 40 MG tablet Take 40 mg by mouth daily.      Marland Kitchen triamterene-hydrochlorothiazide (DYAZIDE) 37.5-25 MG per capsule Take 1 capsule by mouth daily. Take 2 tablets once daily      . XARELTO 20 MG TABS tablet TAKE ONE TABLET EACH DAY  30 tablet  2   No current facility-administered medications for this visit.    Allergies:   No Known Allergies  Social History:  The patient  reports that he has quit smoking. He does not have any smokeless tobacco history on file.   ROS:  Please see the history of present illness.    All other systems reviewed and negative.   OBJECTIVE: VS:  BP  122/78  Pulse 86  Ht 6' (1.829 m)  Wt 197 lb (89.359 kg)  BMI 26.71 kg/m2 Well nourished, well developed, in no acute distress, appearing younger than stated age 58: normal Neck: JVD flat. Carotid bruit absent  Cardiac:  normal S1, S2; IIRR; no murmur Lungs:  clear to auscultation bilaterally, no wheezing, rhonchi or rales Abd: soft, nontender, no hepatomegaly Ext: Edema absent. Pulses 2+  Skin: warm and dry Neuro:  CNs 2-12 intact, no focal abnormalities noted  EKG:     atrial fib with incomplete right bundle branch block and controlled ventricular response at 86 beats per minute.    Signed, Illene Labrador III, MD 02/11/2013 10:51 AM

## 2013-02-27 ENCOUNTER — Other Ambulatory Visit: Payer: Self-pay | Admitting: Interventional Cardiology

## 2013-03-01 ENCOUNTER — Ambulatory Visit (HOSPITAL_COMMUNITY)
Admission: RE | Admit: 2013-03-01 | Discharge: 2013-03-01 | Disposition: A | Discharge status: Home / Self Care | Payer: Medicare Other | Source: Ambulatory Visit | Attending: Internal Medicine | Admitting: Internal Medicine

## 2013-03-01 ENCOUNTER — Encounter (HOSPITAL_COMMUNITY)
Admission: RE | Admit: 2013-03-01 | Discharge: 2013-03-01 | Disposition: A | Discharge status: Home / Self Care | Payer: Medicare Other | Source: Ambulatory Visit | Attending: *Deleted | Admitting: *Deleted

## 2013-03-01 ENCOUNTER — Encounter (HOSPITAL_COMMUNITY)
Admission: RE | Admit: 2013-03-01 | Discharge: 2013-03-01 | Disposition: A | Discharge status: Home / Self Care | Payer: Medicare Other | Source: Ambulatory Visit | Attending: Internal Medicine | Admitting: Internal Medicine

## 2013-03-01 DIAGNOSIS — R918 Other nonspecific abnormal finding of lung field: Secondary | ICD-10-CM | POA: Diagnosis not present

## 2013-03-01 DIAGNOSIS — C61 Malignant neoplasm of prostate: Secondary | ICD-10-CM | POA: Insufficient documentation

## 2013-03-01 DIAGNOSIS — C7952 Secondary malignant neoplasm of bone marrow: Secondary | ICD-10-CM | POA: Diagnosis not present

## 2013-03-01 DIAGNOSIS — C7951 Secondary malignant neoplasm of bone: Secondary | ICD-10-CM | POA: Insufficient documentation

## 2013-03-01 MED ORDER — IOHEXOL 300 MG/ML  SOLN
80.0000 mL | Freq: Once | INTRAMUSCULAR | Status: AC | PRN
  Administered 2013-03-01: 80 mL via INTRAVENOUS

## 2013-03-01 MED ORDER — TECHNETIUM TC 99M MEDRONATE IV KIT
25.0000 | PACK | Freq: Once | INTRAVENOUS | Status: AC | PRN
  Administered 2013-03-01: 25 via INTRAVENOUS

## 2013-03-03 DIAGNOSIS — Z85828 Personal history of other malignant neoplasm of skin: Secondary | ICD-10-CM | POA: Diagnosis not present

## 2013-03-03 DIAGNOSIS — C61 Malignant neoplasm of prostate: Secondary | ICD-10-CM | POA: Diagnosis not present

## 2013-03-03 DIAGNOSIS — Z8546 Personal history of malignant neoplasm of prostate: Secondary | ICD-10-CM | POA: Diagnosis not present

## 2013-03-03 DIAGNOSIS — C7951 Secondary malignant neoplasm of bone: Secondary | ICD-10-CM | POA: Diagnosis not present

## 2013-03-03 DIAGNOSIS — R972 Elevated prostate specific antigen [PSA]: Secondary | ICD-10-CM | POA: Diagnosis not present

## 2013-04-02 DIAGNOSIS — H35369 Drusen (degenerative) of macula, unspecified eye: Secondary | ICD-10-CM | POA: Diagnosis not present

## 2013-04-02 DIAGNOSIS — H43819 Vitreous degeneration, unspecified eye: Secondary | ICD-10-CM | POA: Diagnosis not present

## 2013-04-02 DIAGNOSIS — H52209 Unspecified astigmatism, unspecified eye: Secondary | ICD-10-CM | POA: Diagnosis not present

## 2013-04-02 DIAGNOSIS — H259 Unspecified age-related cataract: Secondary | ICD-10-CM | POA: Diagnosis not present

## 2013-04-15 ENCOUNTER — Ambulatory Visit (INDEPENDENT_AMBULATORY_CARE_PROVIDER_SITE_OTHER): Payer: Medicare Other

## 2013-04-15 ENCOUNTER — Encounter: Payer: Self-pay | Admitting: Podiatry

## 2013-04-15 ENCOUNTER — Ambulatory Visit (INDEPENDENT_AMBULATORY_CARE_PROVIDER_SITE_OTHER): Payer: Medicare Other | Admitting: Podiatry

## 2013-04-15 VITALS — BP 154/110 | HR 80 | Resp 18 | Ht 72.0 in | Wt 195.0 lb

## 2013-04-15 DIAGNOSIS — B351 Tinea unguium: Secondary | ICD-10-CM

## 2013-04-15 DIAGNOSIS — M779 Enthesopathy, unspecified: Secondary | ICD-10-CM | POA: Diagnosis not present

## 2013-04-15 DIAGNOSIS — M79609 Pain in unspecified limb: Secondary | ICD-10-CM

## 2013-04-15 DIAGNOSIS — M2020 Hallux rigidus, unspecified foot: Secondary | ICD-10-CM | POA: Diagnosis not present

## 2013-04-15 DIAGNOSIS — M79606 Pain in leg, unspecified: Secondary | ICD-10-CM

## 2013-04-15 MED ORDER — TRIAMCINOLONE ACETONIDE 10 MG/ML IJ SUSP
10.0000 mg | Freq: Once | INTRAMUSCULAR | Status: AC
  Administered 2013-04-15: 10 mg

## 2013-04-15 NOTE — Progress Notes (Signed)
Subjective:     Patient ID: Jacob Becker, male   DOB: 10-15-1929, 80 y.o.   MRN: [id number]  HPI patient states he has pain around his big toe joint right with spur formation and has nail disease that he can not take care of himself but they're nonpainful both feet   Review of Systems     Objective:   Physical Exam Neurovascular status intact with no changes in health history of well oriented x3 with inflammation pain and bone spur prominence in formation around first metatarsal head right with reduced range of motion. Thick toenail disease 1-5 both feet    Assessment:     Hallux limitus with capsulitis first MPJ right along with structural bone issues and nail disease 1-5 both feet    Plan:     X-rays reviewed and injection around the first MPJ 3 mg Kenalog 5 of Xylocaine Marcaine mixture along with padding around the inflamed area and discuss possibility for surgical spur removal. Debrided nailbeds 1-5 both feet no bleeding noted

## 2013-04-15 NOTE — Progress Notes (Signed)
   Subjective:    Patient ID: Jacob Becker, male    DOB: Jun 27, 1929, 80 y.o.   MRN: [id number]  HPI N wound         L right dorsal 1st MPJ        D 1 week        O        C red, enlarged 1st MPJ with open wound        A rubbing in shoe        T Neosporin bandaid, and removing shoes    Review of Systems  Constitutional: Negative.   HENT: Negative.   Eyes: Negative.   Respiratory: Positive for cough.   Cardiovascular: Negative.   Gastrointestinal: Negative.   Endocrine: Negative.   Genitourinary: Negative.   Musculoskeletal: Negative.   Skin: Negative.   Allergic/Immunologic: Negative.   Neurological: Negative.   Hematological: Negative.   Psychiatric/Behavioral: Negative.        Objective:   Physical Exam        Assessment & Plan:

## 2013-04-16 DIAGNOSIS — Z85828 Personal history of other malignant neoplasm of skin: Secondary | ICD-10-CM | POA: Diagnosis not present

## 2013-04-16 DIAGNOSIS — L570 Actinic keratosis: Secondary | ICD-10-CM | POA: Diagnosis not present

## 2013-04-21 ENCOUNTER — Other Ambulatory Visit: Payer: Self-pay | Admitting: Interventional Cardiology

## 2013-05-17 DIAGNOSIS — C61 Malignant neoplasm of prostate: Secondary | ICD-10-CM | POA: Diagnosis not present

## 2013-05-17 DIAGNOSIS — I10 Essential (primary) hypertension: Secondary | ICD-10-CM | POA: Diagnosis not present

## 2013-05-17 DIAGNOSIS — E7800 Pure hypercholesterolemia, unspecified: Secondary | ICD-10-CM | POA: Diagnosis not present

## 2013-05-17 DIAGNOSIS — Z85828 Personal history of other malignant neoplasm of skin: Secondary | ICD-10-CM | POA: Diagnosis not present

## 2013-05-17 DIAGNOSIS — R972 Elevated prostate specific antigen [PSA]: Secondary | ICD-10-CM | POA: Diagnosis not present

## 2013-05-17 DIAGNOSIS — Z8546 Personal history of malignant neoplasm of prostate: Secondary | ICD-10-CM | POA: Diagnosis not present

## 2013-05-17 DIAGNOSIS — C7952 Secondary malignant neoplasm of bone marrow: Secondary | ICD-10-CM | POA: Diagnosis not present

## 2013-05-17 DIAGNOSIS — C7951 Secondary malignant neoplasm of bone: Secondary | ICD-10-CM | POA: Diagnosis not present

## 2013-05-17 DIAGNOSIS — C779 Secondary and unspecified malignant neoplasm of lymph node, unspecified: Secondary | ICD-10-CM | POA: Diagnosis not present

## 2013-05-17 DIAGNOSIS — I4891 Unspecified atrial fibrillation: Secondary | ICD-10-CM | POA: Diagnosis not present

## 2013-05-24 ENCOUNTER — Other Ambulatory Visit: Payer: Self-pay

## 2013-05-24 MED ORDER — RAMIPRIL 10 MG PO CAPS: 10.0000 mg | ORAL_CAPSULE | Freq: Two times a day (BID) | ORAL | Status: DC

## 2013-06-14 ENCOUNTER — Ambulatory Visit (INDEPENDENT_AMBULATORY_CARE_PROVIDER_SITE_OTHER): Payer: Medicare Other | Admitting: Podiatry

## 2013-06-14 ENCOUNTER — Encounter: Payer: Self-pay | Admitting: Podiatry

## 2013-06-14 VITALS — BP 164/92 | HR 85 | Resp 17 | Ht 72.0 in | Wt 190.0 lb

## 2013-06-14 DIAGNOSIS — M2020 Hallux rigidus, unspecified foot: Secondary | ICD-10-CM | POA: Diagnosis not present

## 2013-06-14 DIAGNOSIS — L97509 Non-pressure chronic ulcer of other part of unspecified foot with unspecified severity: Secondary | ICD-10-CM | POA: Diagnosis not present

## 2013-06-14 NOTE — Patient Instructions (Signed)
Soak in cup of epson salts in qt. Warm water each day and apply medicine to the open area with bandaid

## 2013-06-14 NOTE — Progress Notes (Signed)
   Subjective:    Patient ID: Jacob Becker, male    DOB: [birth year], 80 y.o.   MRN: [id number]  HPI Comments: Pt states his right 1st dorsal MPJ has a hole in it.  Pt states he has been dressing the area with a Neosporin bandaid and then padding area around the wound.     Review of Systems     Objective:   Physical Exam        Assessment & Plan:

## 2013-06-14 NOTE — Progress Notes (Signed)
Subjective:     Patient ID: Jacob Becker, male   DOB: 11-Jan-1930, 80 y.o.   MRN: [id number]  HPI patient presents with irritation on top of his right big toe joint were has a large bone spur admitting that he's been wearing tight hard shoes on this for the most part of his day   Review of Systems     Objective:   Physical Exam Neurovascular status was intact with no change in health history and a small 3 x 3 mm superficial ulceration noted on top of the first metatarsal secondary bone spur formation creating inflammation again shoe gear    Assessment:     ulcer right first metatarsal does localize with no proximal edema erythema or drainage noted    Plan:     Reviewed condition and he'll let me know immediately if any redness swelling or drainage were to occur and at this time applied Iodosorb to this instructed on soaks and dispense Darco shoe so there is no pressure against the first metatarsal head. Discussed hallux limitus and ultimately may need to correct this condition and what would entail

## 2013-06-17 ENCOUNTER — Telehealth: Payer: Self-pay | Admitting: *Deleted

## 2013-06-17 NOTE — Telephone Encounter (Signed)
Please call me concerning my husband.  I have a couple of questions pertaining to my husband.  She came by the office and asked the questions with Dr. Paulla Dolly.

## 2013-06-29 ENCOUNTER — Other Ambulatory Visit: Payer: Self-pay | Admitting: *Deleted

## 2013-06-29 ENCOUNTER — Telehealth: Payer: Self-pay | Admitting: *Deleted

## 2013-06-29 MED ORDER — DOXYCYCLINE HYCLATE 100 MG PO TABS: 100.0000 mg | ORAL_TABLET | Freq: Two times a day (BID) | ORAL | Status: DC

## 2013-06-29 NOTE — Telephone Encounter (Signed)
My husband spoke to Dr. Paulla Dolly last night.  He said for [place] to call today.  My husband needs a refill on his Doxycycline Hyclate 100mg .  He only had two pills left, he took one last night and the other this morning.  He was taking one capsule twice a day.  I told her I would check with Dr. Paulla Dolly this afternoon and call her back with a response.  She asked that it be called into [facility].

## 2013-06-29 NOTE — Telephone Encounter (Signed)
Patient's prescription printed up for some reason when attempted to e-scribe.  I just called the prescription in to [facility].

## 2013-06-29 NOTE — Telephone Encounter (Signed)
I called an informed her that the prescription was sent to the pharmacy.  Doxycycline 100mg , Qty. 20, take one tablet twice a day per Dr. Paulla Dolly.

## 2013-07-15 ENCOUNTER — Encounter: Payer: Self-pay | Admitting: Podiatry

## 2013-07-15 ENCOUNTER — Ambulatory Visit (INDEPENDENT_AMBULATORY_CARE_PROVIDER_SITE_OTHER): Payer: Medicare Other | Admitting: Podiatry

## 2013-07-15 ENCOUNTER — Ambulatory Visit (INDEPENDENT_AMBULATORY_CARE_PROVIDER_SITE_OTHER): Payer: Medicare Other

## 2013-07-15 VITALS — BP 160/97 | HR 66 | Resp 15 | Ht 72.0 in | Wt 195.0 lb

## 2013-07-15 DIAGNOSIS — M2020 Hallux rigidus, unspecified foot: Secondary | ICD-10-CM

## 2013-07-15 DIAGNOSIS — L97509 Non-pressure chronic ulcer of other part of unspecified foot with unspecified severity: Secondary | ICD-10-CM

## 2013-07-15 NOTE — Progress Notes (Signed)
Subjective:     Patient ID: Jacob Becker, male   DOB: 03-02-1929, 80 y.o.   MRN: [id number]  HPI patient states is doing much better on my right foot but am still concerned about wearing shoes   Review of Systems     Objective:   Physical Exam Neurovascular status intact with first metatarsal head inflammation and ulcer which is resolving with small amount of crusted tissue noted    Assessment:     Healing well from previous infection dorsum first metatarsal and is wearing his surgical shoe as instructed    Plan:     Took x-rays to rule out any bony ostial lysis and reviewed bone spur formation with him. He will try to moderate he shoe gear and if this were to recur we discussed excision of bone at one point in the future

## 2013-07-26 ENCOUNTER — Telehealth: Payer: Self-pay | Admitting: *Deleted

## 2013-07-26 MED ORDER — TRIAMTERENE-HCTZ 37.5-25 MG PO CAPS: 2.0000 | ORAL_CAPSULE | Freq: Every day | ORAL | Status: DC

## 2013-07-26 NOTE — Telephone Encounter (Signed)
Verified in [facility] pt should be taking Dyazide 37.5-25mg  2 capsule daily. Corrected Rx sent to [facility]

## 2013-07-26 NOTE — Telephone Encounter (Signed)
[facility] called needing to clarify if the patient should be taking maxide tablet or dyazide capsule. Also should this be one or two daily? Please advise. Thanks, MI

## 2013-07-28 DIAGNOSIS — Z0000 Encounter for general adult medical examination without abnormal findings: Secondary | ICD-10-CM | POA: Diagnosis not present

## 2013-07-28 DIAGNOSIS — I129 Hypertensive chronic kidney disease with stage 1 through stage 4 chronic kidney disease, or unspecified chronic kidney disease: Secondary | ICD-10-CM | POA: Diagnosis not present

## 2013-07-28 DIAGNOSIS — Z23 Encounter for immunization: Secondary | ICD-10-CM | POA: Diagnosis not present

## 2013-07-28 DIAGNOSIS — E782 Mixed hyperlipidemia: Secondary | ICD-10-CM | POA: Diagnosis not present

## 2013-07-28 DIAGNOSIS — N183 Chronic kidney disease, stage 3 unspecified: Secondary | ICD-10-CM | POA: Diagnosis not present

## 2013-07-28 DIAGNOSIS — Z1331 Encounter for screening for depression: Secondary | ICD-10-CM | POA: Diagnosis not present

## 2013-08-03 ENCOUNTER — Other Ambulatory Visit (HOSPITAL_COMMUNITY): Payer: Self-pay | Admitting: Internal Medicine

## 2013-08-03 DIAGNOSIS — C61 Malignant neoplasm of prostate: Secondary | ICD-10-CM

## 2013-08-16 ENCOUNTER — Encounter (HOSPITAL_COMMUNITY): Payer: Medicare Other

## 2013-08-16 ENCOUNTER — Ambulatory Visit (HOSPITAL_COMMUNITY)
Admission: RE | Admit: 2013-08-16 | Discharge: 2013-08-16 | Disposition: A | Discharge status: Home / Self Care | Payer: Medicare Other | Source: Ambulatory Visit | Attending: Internal Medicine | Admitting: Internal Medicine

## 2013-08-16 ENCOUNTER — Encounter (HOSPITAL_COMMUNITY)
Admission: RE | Admit: 2013-08-16 | Discharge: 2013-08-16 | Disposition: A | Discharge status: Home / Self Care | Payer: Medicare Other | Source: Ambulatory Visit | Attending: Internal Medicine | Admitting: Internal Medicine

## 2013-08-16 DIAGNOSIS — C61 Malignant neoplasm of prostate: Secondary | ICD-10-CM

## 2013-08-16 DIAGNOSIS — I2510 Atherosclerotic heart disease of native coronary artery without angina pectoris: Secondary | ICD-10-CM | POA: Diagnosis not present

## 2013-08-16 DIAGNOSIS — C7951 Secondary malignant neoplasm of bone: Secondary | ICD-10-CM | POA: Diagnosis not present

## 2013-08-16 DIAGNOSIS — C7952 Secondary malignant neoplasm of bone marrow: Secondary | ICD-10-CM

## 2013-08-16 MED ORDER — TECHNETIUM TC 99M MEDRONATE IV KIT
25.0000 | PACK | Freq: Once | INTRAVENOUS | Status: AC | PRN
  Administered 2013-08-16: 25 via INTRAVENOUS

## 2013-08-16 MED ORDER — IOHEXOL 300 MG/ML  SOLN
80.0000 mL | Freq: Once | INTRAMUSCULAR | Status: AC | PRN
  Administered 2013-08-16: 80 mL via INTRAVENOUS

## 2013-08-17 ENCOUNTER — Other Ambulatory Visit: Payer: Self-pay | Admitting: Interventional Cardiology

## 2013-08-18 NOTE — Telephone Encounter (Signed)
Sinclair Grooms, MD at 02/11/2013 10:50 AM   XARELTO 20 MG TABS tablet  TAKE ONE TABLET [place]    Your physician recommends that you continue on your current medications as directed. Please refer to the Current Medication list given to you today.

## 2013-08-18 NOTE — Telephone Encounter (Signed)
Follow up:     Pt's wife calling for the status of this refill please

## 2013-08-30 DIAGNOSIS — R5383 Other fatigue: Secondary | ICD-10-CM | POA: Diagnosis not present

## 2013-08-30 DIAGNOSIS — N529 Male erectile dysfunction, unspecified: Secondary | ICD-10-CM | POA: Diagnosis not present

## 2013-08-30 DIAGNOSIS — E7800 Pure hypercholesterolemia, unspecified: Secondary | ICD-10-CM | POA: Diagnosis not present

## 2013-08-30 DIAGNOSIS — I10 Essential (primary) hypertension: Secondary | ICD-10-CM | POA: Diagnosis not present

## 2013-08-30 DIAGNOSIS — Z79899 Other long term (current) drug therapy: Secondary | ICD-10-CM | POA: Diagnosis not present

## 2013-08-30 DIAGNOSIS — C61 Malignant neoplasm of prostate: Secondary | ICD-10-CM | POA: Diagnosis not present

## 2013-08-30 DIAGNOSIS — Z7901 Long term (current) use of anticoagulants: Secondary | ICD-10-CM | POA: Diagnosis not present

## 2013-08-30 DIAGNOSIS — M7730 Calcaneal spur, unspecified foot: Secondary | ICD-10-CM | POA: Diagnosis not present

## 2013-08-30 DIAGNOSIS — IMO0002 Reserved for concepts with insufficient information to code with codable children: Secondary | ICD-10-CM | POA: Diagnosis not present

## 2013-08-30 DIAGNOSIS — Z8546 Personal history of malignant neoplasm of prostate: Secondary | ICD-10-CM | POA: Diagnosis not present

## 2013-08-30 DIAGNOSIS — R5381 Other malaise: Secondary | ICD-10-CM | POA: Diagnosis not present

## 2013-08-30 DIAGNOSIS — C779 Secondary and unspecified malignant neoplasm of lymph node, unspecified: Secondary | ICD-10-CM | POA: Diagnosis not present

## 2013-08-30 DIAGNOSIS — I499 Cardiac arrhythmia, unspecified: Secondary | ICD-10-CM | POA: Diagnosis not present

## 2013-08-30 DIAGNOSIS — Z85828 Personal history of other malignant neoplasm of skin: Secondary | ICD-10-CM | POA: Diagnosis not present

## 2013-08-30 DIAGNOSIS — C7951 Secondary malignant neoplasm of bone: Secondary | ICD-10-CM | POA: Diagnosis not present

## 2013-08-30 DIAGNOSIS — R972 Elevated prostate specific antigen [PSA]: Secondary | ICD-10-CM | POA: Diagnosis not present

## 2013-08-30 DIAGNOSIS — C7952 Secondary malignant neoplasm of bone marrow: Secondary | ICD-10-CM | POA: Diagnosis not present

## 2013-08-30 DIAGNOSIS — R351 Nocturia: Secondary | ICD-10-CM | POA: Diagnosis not present

## 2013-08-30 DIAGNOSIS — N9989 Other postprocedural complications and disorders of genitourinary system: Secondary | ICD-10-CM | POA: Diagnosis not present

## 2013-09-03 ENCOUNTER — Ambulatory Visit (INDEPENDENT_AMBULATORY_CARE_PROVIDER_SITE_OTHER): Payer: Medicare Other | Admitting: *Deleted

## 2013-09-03 DIAGNOSIS — E7800 Pure hypercholesterolemia, unspecified: Secondary | ICD-10-CM

## 2013-09-03 LAB — ALT: ALT: 14 U/L (ref 0–53)

## 2013-09-03 LAB — LIPID PANEL
CHOL/HDL RATIO: 3
Cholesterol: 162 mg/dL (ref 0–200)
HDL: 56.1 mg/dL (ref >39.00)
LDL Cholesterol: 89 mg/dL (ref 0–99)
NONHDL: 105.9
TRIGLYCERIDES: 87 mg/dL (ref 0.0–149.0)
VLDL: 17.4 mg/dL (ref 0.0–40.0)

## 2013-09-13 DIAGNOSIS — M2020 Hallux rigidus, unspecified foot: Secondary | ICD-10-CM | POA: Diagnosis not present

## 2013-09-24 ENCOUNTER — Telehealth: Payer: Self-pay | Admitting: Interventional Cardiology

## 2013-09-24 NOTE — Telephone Encounter (Signed)
Received request from Nurse fax box, documents faxed for surgical clearance. To: [facility] [employer] Fax number: [phone] Attention: [phone]/km

## 2013-09-28 DIAGNOSIS — C44320 Squamous cell carcinoma of skin of unspecified parts of face: Secondary | ICD-10-CM | POA: Diagnosis not present

## 2013-09-28 DIAGNOSIS — Z85828 Personal history of other malignant neoplasm of skin: Secondary | ICD-10-CM | POA: Diagnosis not present

## 2013-09-28 DIAGNOSIS — D485 Neoplasm of uncertain behavior of skin: Secondary | ICD-10-CM | POA: Diagnosis not present

## 2013-09-28 DIAGNOSIS — L570 Actinic keratosis: Secondary | ICD-10-CM | POA: Diagnosis not present

## 2013-10-01 ENCOUNTER — Encounter (HOSPITAL_BASED_OUTPATIENT_CLINIC_OR_DEPARTMENT_OTHER): Payer: Self-pay | Admitting: *Deleted

## 2013-10-01 NOTE — Progress Notes (Signed)
Had recent labs sloan-k[place]ing-will bring-treated for bone mets from prostate cancer

## 2013-10-01 NOTE — Progress Notes (Signed)
10/01/13 1135  OBSTRUCTIVE SLEEP APNEA  Have you ever been diagnosed with sleep apnea through a sleep study? No  Do you snore loudly (loud enough to be heard through closed doors)?  0  Do you often feel tired, fatigued, or sleepy during the daytime? 0  Has anyone observed you stop breathing during your sleep? 0  Do you have, or are you being treated for high blood pressure? 1  BMI more than 35 kg/m2? 0  Age over 78 years old? 1  Neck circumference greater than 40 cm/16 inches? 1  Gender: 1  Obstructive Sleep Apnea Score 4  Score 4 or greater  Results sent to PCP

## 2013-10-06 ENCOUNTER — Other Ambulatory Visit: Payer: Self-pay | Admitting: Physician Assistant

## 2013-10-07 ENCOUNTER — Encounter (HOSPITAL_BASED_OUTPATIENT_CLINIC_OR_DEPARTMENT_OTHER): Payer: Medicare Other | Admitting: Anesthesiology

## 2013-10-07 ENCOUNTER — Encounter (HOSPITAL_BASED_OUTPATIENT_CLINIC_OR_DEPARTMENT_OTHER)
Admission: RE | Disposition: A | Discharge status: Home / Self Care | Payer: Self-pay | Source: Ambulatory Visit | Attending: Orthopedic Surgery

## 2013-10-07 ENCOUNTER — Ambulatory Visit (HOSPITAL_BASED_OUTPATIENT_CLINIC_OR_DEPARTMENT_OTHER): Payer: Medicare Other | Admitting: Anesthesiology

## 2013-10-07 ENCOUNTER — Encounter (HOSPITAL_BASED_OUTPATIENT_CLINIC_OR_DEPARTMENT_OTHER): Payer: Self-pay | Admitting: Certified Registered"

## 2013-10-07 ENCOUNTER — Ambulatory Visit (HOSPITAL_BASED_OUTPATIENT_CLINIC_OR_DEPARTMENT_OTHER)
Admission: RE | Admit: 2013-10-07 | Discharge: 2013-10-07 | Disposition: A | Discharge status: Home / Self Care | Payer: Medicare Other | Source: Ambulatory Visit | Attending: Orthopedic Surgery | Admitting: Orthopedic Surgery

## 2013-10-07 DIAGNOSIS — M2020 Hallux rigidus, unspecified foot: Secondary | ICD-10-CM | POA: Diagnosis not present

## 2013-10-07 DIAGNOSIS — C61 Malignant neoplasm of prostate: Secondary | ICD-10-CM | POA: Insufficient documentation

## 2013-10-07 DIAGNOSIS — Z7901 Long term (current) use of anticoagulants: Secondary | ICD-10-CM | POA: Insufficient documentation

## 2013-10-07 DIAGNOSIS — I10 Essential (primary) hypertension: Secondary | ICD-10-CM | POA: Diagnosis not present

## 2013-10-07 DIAGNOSIS — L97509 Non-pressure chronic ulcer of other part of unspecified foot with unspecified severity: Secondary | ICD-10-CM | POA: Diagnosis not present

## 2013-10-07 DIAGNOSIS — C7951 Secondary malignant neoplasm of bone: Secondary | ICD-10-CM | POA: Diagnosis not present

## 2013-10-07 DIAGNOSIS — Z9089 Acquired absence of other organs: Secondary | ICD-10-CM | POA: Insufficient documentation

## 2013-10-07 DIAGNOSIS — Z79899 Other long term (current) drug therapy: Secondary | ICD-10-CM | POA: Diagnosis not present

## 2013-10-07 DIAGNOSIS — I4891 Unspecified atrial fibrillation: Secondary | ICD-10-CM | POA: Insufficient documentation

## 2013-10-07 DIAGNOSIS — E785 Hyperlipidemia, unspecified: Secondary | ICD-10-CM | POA: Insufficient documentation

## 2013-10-07 DIAGNOSIS — C7952 Secondary malignant neoplasm of bone marrow: Secondary | ICD-10-CM

## 2013-10-07 DIAGNOSIS — M2021 Hallux rigidus, right foot: Secondary | ICD-10-CM

## 2013-10-07 HISTORY — DX: Cardiac arrhythmia, unspecified: I49.9

## 2013-10-07 HISTORY — DX: Essential (primary) hypertension: I10

## 2013-10-07 HISTORY — DX: Unspecified osteoarthritis, unspecified site: M19.90

## 2013-10-07 HISTORY — DX: Presence of spectacles and contact lenses: Z97.3

## 2013-10-07 HISTORY — DX: Malignant neoplasm of bone and articular cartilage, unspecified: C41.9

## 2013-10-07 HISTORY — DX: Unspecified atrial fibrillation: I48.91

## 2013-10-07 HISTORY — DX: Hyperlipidemia, unspecified: E78.5

## 2013-10-07 HISTORY — PX: CHEILECTOMY: SHX1336

## 2013-10-07 HISTORY — DX: Reserved for inherently not codable concepts without codable children: IMO0001

## 2013-10-07 LAB — POCT I-STAT, CHEM 8
BUN: 29 mg/dL — ABNORMAL HIGH (ref 6–23)
CREATININE: 1.2 mg/dL (ref 0.50–1.35)
Calcium, Ion: 1.14 mmol/L (ref 1.13–1.30)
Chloride: 107 mEq/L (ref 96–112)
Glucose, Bld: 136 mg/dL — ABNORMAL HIGH (ref 70–99)
HCT: 43 % (ref 39.0–52.0)
Hemoglobin: 14.6 g/dL (ref 13.0–17.0)
Potassium: 3.5 mEq/L — ABNORMAL LOW (ref 3.7–5.3)
SODIUM: 137 meq/L (ref 137–147)
TCO2: 25 mmol/L (ref 0–100)

## 2013-10-07 SURGERY — CHEILECTOMY
Anesthesia: Monitor Anesthesia Care | Site: Foot | Laterality: Right

## 2013-10-07 MED ORDER — MIDAZOLAM HCL 2 MG/2ML IJ SOLN: 1.0000 mg | INTRAMUSCULAR | Status: DC | PRN

## 2013-10-07 MED ORDER — ONDANSETRON HCL 4 MG/2ML IJ SOLN
INTRAMUSCULAR | Status: DC | PRN
  Administered 2013-10-07: 4 mg via INTRAVENOUS

## 2013-10-07 MED ORDER — BACITRACIN ZINC 500 UNIT/GM EX OINT
TOPICAL_OINTMENT | CUTANEOUS | Status: DC | PRN
  Administered 2013-10-07: 1 via TOPICAL

## 2013-10-07 MED ORDER — LIDOCAINE HCL 2 % IJ SOLN
INTRAMUSCULAR | Status: AC
  Filled 2013-10-07: qty 20

## 2013-10-07 MED ORDER — BUPIVACAINE HCL (PF) 0.5 % IJ SOLN
INTRAMUSCULAR | Status: AC
  Filled 2013-10-07: qty 30

## 2013-10-07 MED ORDER — BUPIVACAINE-EPINEPHRINE (PF) 0.5% -1:200000 IJ SOLN
INTRAMUSCULAR | Status: AC
  Filled 2013-10-07: qty 30

## 2013-10-07 MED ORDER — 0.9 % SODIUM CHLORIDE (POUR BTL) OPTIME
TOPICAL | Status: DC | PRN
  Administered 2013-10-07: 200 mL

## 2013-10-07 MED ORDER — CEFAZOLIN SODIUM-DEXTROSE 2-3 GM-% IV SOLR
INTRAVENOUS | Status: AC
  Filled 2013-10-07: qty 50

## 2013-10-07 MED ORDER — TRAMADOL HCL 50 MG PO TABS: 50.0000 mg | ORAL_TABLET | Freq: Four times a day (QID) | ORAL | Status: DC | PRN

## 2013-10-07 MED ORDER — SODIUM CHLORIDE 0.9 % IV SOLN: INTRAVENOUS | Status: DC

## 2013-10-07 MED ORDER — BUPIVACAINE-EPINEPHRINE (PF) 0.5% -1:200000 IJ SOLN
INTRAMUSCULAR | Status: DC | PRN
  Administered 2013-10-07: 30 mL via PERINEURAL

## 2013-10-07 MED ORDER — FENTANYL CITRATE 0.05 MG/ML IJ SOLN: 25.0000 ug | INTRAMUSCULAR | Status: DC | PRN

## 2013-10-07 MED ORDER — LIDOCAINE HCL (PF) 1 % IJ SOLN
INTRAMUSCULAR | Status: AC
  Filled 2013-10-07: qty 30

## 2013-10-07 MED ORDER — PROPOFOL 10 MG/ML IV EMUL
INTRAVENOUS | Status: AC
  Filled 2013-10-07: qty 50

## 2013-10-07 MED ORDER — SUCCINYLCHOLINE CHLORIDE 20 MG/ML IJ SOLN
INTRAMUSCULAR | Status: AC
  Filled 2013-10-07: qty 1

## 2013-10-07 MED ORDER — FENTANYL CITRATE 0.05 MG/ML IJ SOLN: 50.0000 ug | INTRAMUSCULAR | Status: DC | PRN

## 2013-10-07 MED ORDER — ACETAMINOPHEN 500 MG PO TABS
1000.0000 mg | ORAL_TABLET | Freq: Once | ORAL | Status: DC
Start: 2013-10-07 — End: 2013-10-07

## 2013-10-07 MED ORDER — ONDANSETRON HCL 4 MG/2ML IJ SOLN: 4.0000 mg | Freq: Once | INTRAMUSCULAR | Status: DC | PRN

## 2013-10-07 MED ORDER — CEFAZOLIN SODIUM-DEXTROSE 2-3 GM-% IV SOLR
2.0000 g | INTRAVENOUS | Status: AC
  Administered 2013-10-07: 2 g via INTRAVENOUS

## 2013-10-07 MED ORDER — FENTANYL CITRATE 0.05 MG/ML IJ SOLN
INTRAMUSCULAR | Status: AC
  Filled 2013-10-07: qty 6

## 2013-10-07 MED ORDER — CHLORHEXIDINE GLUCONATE 4 % EX LIQD: 60.0000 mL | Freq: Once | CUTANEOUS | Status: DC

## 2013-10-07 MED ORDER — LACTATED RINGERS IV SOLN
INTRAVENOUS | Status: DC
  Administered 2013-10-07: 07:00:00 via INTRAVENOUS

## 2013-10-07 MED ORDER — FENTANYL CITRATE 0.05 MG/ML IJ SOLN
INTRAMUSCULAR | Status: DC | PRN
  Administered 2013-10-07: 50 ug via INTRAVENOUS

## 2013-10-07 MED ORDER — BACITRACIN ZINC 500 UNIT/GM EX OINT
TOPICAL_OINTMENT | CUTANEOUS | Status: AC
  Filled 2013-10-07: qty 28.35

## 2013-10-07 SURGICAL SUPPLY — 70 items
BANDAGE ESMARK 6X9 LF (GAUZE/BANDAGES/DRESSINGS) IMPLANT
BLADE AVERAGE 25MMX9MM (BLADE) ×1
BLADE AVERAGE 25X9 (BLADE) ×2 IMPLANT
BLADE MINI RND TIP GREEN BEAV (BLADE) IMPLANT
BLADE OSC/SAG .038X5.5 CUT EDG (BLADE) IMPLANT
BLADE SURG 15 STRL LF DISP TIS (BLADE) ×2 IMPLANT
BLADE SURG 15 STRL SS (BLADE) ×3
BNDG CMPR 9X4 STRL LF SNTH (GAUZE/BANDAGES/DRESSINGS) ×1
BNDG CMPR 9X6 STRL LF SNTH (GAUZE/BANDAGES/DRESSINGS)
BNDG COHESIVE 4X5 TAN STRL (GAUZE/BANDAGES/DRESSINGS) ×3 IMPLANT
BNDG CONFORM 2 STRL LF (GAUZE/BANDAGES/DRESSINGS) IMPLANT
BNDG CONFORM 3 STRL LF (GAUZE/BANDAGES/DRESSINGS) ×3 IMPLANT
BNDG ESMARK 4X9 LF (GAUZE/BANDAGES/DRESSINGS) ×2 IMPLANT
BNDG ESMARK 6X9 LF (GAUZE/BANDAGES/DRESSINGS)
CHLORAPREP W/TINT 26ML (MISCELLANEOUS) ×3 IMPLANT
COVER TABLE BACK 60X90 (DRAPES) ×3 IMPLANT
CUFF TOURNIQUET SINGLE 18IN (TOURNIQUET CUFF) ×2 IMPLANT
CUFF TOURNIQUET SINGLE 34IN LL (TOURNIQUET CUFF) IMPLANT
DRAPE EXTREMITY T 121X128X90 (DRAPE) ×3 IMPLANT
DRAPE OEC MINIVIEW 54X84 (DRAPES) IMPLANT
DRAPE U-SHAPE 47X51 STRL (DRAPES) ×3 IMPLANT
DRSG EMULSION OIL 3X3 NADH (GAUZE/BANDAGES/DRESSINGS) ×3 IMPLANT
DRSG PAD ABDOMINAL 8X10 ST (GAUZE/BANDAGES/DRESSINGS) ×3 IMPLANT
ELECT REM PT RETURN 9FT ADLT (ELECTROSURGICAL) ×3
ELECTRODE REM PT RTRN 9FT ADLT (ELECTROSURGICAL) ×1 IMPLANT
GAUZE SPONGE 4X4 12PLY STRL (GAUZE/BANDAGES/DRESSINGS) ×6 IMPLANT
GLOVE BIO SURGEON STRL SZ7 (GLOVE) ×3 IMPLANT
GLOVE BIO SURGEON STRL SZ8 (GLOVE) ×3 IMPLANT
GLOVE BIOGEL PI IND STRL 7.0 (GLOVE) ×1 IMPLANT
GLOVE BIOGEL PI IND STRL 8 (GLOVE) ×1 IMPLANT
GLOVE BIOGEL PI INDICATOR 7.0 (GLOVE) ×4
GLOVE BIOGEL PI INDICATOR 8 (GLOVE) ×2
GLOVE ECLIPSE 6.5 STRL STRAW (GLOVE) ×2 IMPLANT
GLOVE EXAM NITRILE MD LF STRL (GLOVE) ×2 IMPLANT
GOWN STRL REUS W/ TWL LRG LVL3 (GOWN DISPOSABLE) ×2 IMPLANT
GOWN STRL REUS W/ TWL XL LVL3 (GOWN DISPOSABLE) ×1 IMPLANT
GOWN STRL REUS W/TWL LRG LVL3 (GOWN DISPOSABLE) ×6
GOWN STRL REUS W/TWL XL LVL3 (GOWN DISPOSABLE) ×3
NDL HYPO 25X1 1.5 SAFETY (NEEDLE) IMPLANT
NEEDLE HYPO 25X1 1.5 SAFETY (NEEDLE) ×3 IMPLANT
NS IRRIG 1000ML POUR BTL (IV SOLUTION) ×3 IMPLANT
PACK BASIN DAY SURGERY FS (CUSTOM PROCEDURE TRAY) ×3 IMPLANT
PAD CAST 4YDX4 CTTN HI CHSV (CAST SUPPLIES) ×1 IMPLANT
PADDING CAST ABS 4INX4YD NS (CAST SUPPLIES) ×2
PADDING CAST ABS COTTON 4X4 ST (CAST SUPPLIES) ×1 IMPLANT
PADDING CAST COTTON 4X4 STRL (CAST SUPPLIES) ×3
PENCIL BUTTON HOLSTER BLD 10FT (ELECTRODE) ×3 IMPLANT
SANITIZER HAND PURELL 535ML FO (MISCELLANEOUS) ×3 IMPLANT
SHEET MEDIUM DRAPE 40X70 STRL (DRAPES) ×3 IMPLANT
SLEEVE SCD COMPRESS KNEE MED (MISCELLANEOUS) ×1 IMPLANT
SPONGE LAP 18X18 X RAY DECT (DISPOSABLE) ×3 IMPLANT
STOCKINETTE 6  STRL (DRAPES) ×2
STOCKINETTE 6 STRL (DRAPES) ×1 IMPLANT
SUCTION FRAZIER TIP 10 FR DISP (SUCTIONS) IMPLANT
SUT ETHILON 3 0 PS 1 (SUTURE) ×3 IMPLANT
SUT ETHILON 4 0 PS 2 18 (SUTURE) IMPLANT
SUT MNCRL AB 3-0 PS2 18 (SUTURE) ×3 IMPLANT
SUT MNCRL AB 4-0 PS2 18 (SUTURE) IMPLANT
SUT VIC AB 2-0 SH 27 (SUTURE)
SUT VIC AB 2-0 SH 27XBRD (SUTURE) IMPLANT
SUT VIC AB 3-0 PS1 18 (SUTURE)
SUT VIC AB 3-0 PS1 18XBRD (SUTURE) IMPLANT
SYR BULB 3OZ (MISCELLANEOUS) ×3 IMPLANT
SYR CONTROL 10ML LL (SYRINGE) ×2 IMPLANT
TOWEL OR 17X24 6PK STRL BLUE (TOWEL DISPOSABLE) ×5 IMPLANT
TOWEL OR NON WOVEN STRL DISP B (DISPOSABLE) IMPLANT
TUBE CONNECTING 20'X1/4 (TUBING)
TUBE CONNECTING 20X1/4 (TUBING) IMPLANT
UNDERPAD 30X30 INCONTINENT (UNDERPADS AND DIAPERS) ×3 IMPLANT
YANKAUER SUCT BULB TIP NO VENT (SUCTIONS) IMPLANT

## 2013-10-07 NOTE — Anesthesia Preprocedure Evaluation (Addendum)
Anesthesia Evaluation  Patient identified by MRN, date of birth, ID band Patient awake    Reviewed: Allergy & Precautions, H&P , NPO status , Patient's Chart, lab work & pertinent test results  Airway Mallampati: II  Neck ROM: full    Dental   Pulmonary former smoker,  breath sounds clear to auscultation        Cardiovascular hypertension, Pt. on medications and Pt. on home beta blockers + CAD + dysrhythmias Atrial Fibrillation Rhythm:irregular Rate:Normal  S/p PTCA   Neuro/Psych    GI/Hepatic   Endo/Other    Renal/GU      Musculoskeletal  (+) Arthritis -,   Abdominal   Peds  Hematology   Anesthesia Other Findings   Reproductive/Obstetrics                          Anesthesia Physical Anesthesia Plan  ASA: III  Anesthesia Plan: MAC and Regional   Post-op Pain Management:    Induction: Intravenous  Airway Management Planned: Simple Face Mask  Additional Equipment:   Intra-op Plan:   Post-operative Plan:   Informed Consent: I have reviewed the patients History and Physical, chart, labs and discussed the procedure including the risks, benefits and alternatives for the proposed anesthesia with the patient or authorized representative who has indicated his/her understanding and acceptance.     Plan Discussed with: CRNA, Anesthesiologist and Surgeon  Anesthesia Plan Comments:         Anesthesia Quick Evaluation

## 2013-10-07 NOTE — H&P (Signed)
Jacob Becker is an 80 y.o. male.   Chief Complaint: right foot pain HPI: 80 y/o male with right hallux rigidus and ulcer on the dorsum of the medial forefoot.  He presents now for right hallux MPJ cheilectomy.  Past Medical History  Diagnosis Date  . Wears glasses   . Cancer     prostate  . Metastatic bone cancer     from prostate  . Dysrhythmia     af  . AF (atrial fibrillation)   . Hypertension   . Hyperlipemia   . Arthritis     Past Surgical History  Procedure Laterality Date  . Hernia repair      rt and lt ing  . Cystoscopy prostate w/ laser  2012    turp  . Tonsillectomy    . Knee arthroscopy  2005    right  . Colonoscopy      Family History  Problem Relation Age of Onset  . Heart failure Father    Social History:  reports that he quit smoking about 42 years ago. He does not have any smokeless tobacco history on file. He reports that he drinks alcohol. He reports that he does not use illicit drugs.  Allergies: No Known Allergies  Medications Prior to Admission  Medication Sig Dispense Refill  . amLODipine (NORVASC) 5 MG tablet Take 1 tablet (5 mg total) by mouth daily.  90 tablet  3  . Calcium-Magnesium-Vitamin D (CALCIUM 500 PO) Take 500 mg by mouth 2 (two) times daily.      . Coenzyme Q10 (CO Q 10) 100 MG CAPS Take 100 mg by mouth daily.      . colesevelam (WELCHOL) 625 MG tablet Take 625 mg by mouth 2 (two) times daily with a meal.      . doxycycline (VIBRA-TABS) 100 MG tablet Take 1 tablet (100 mg total) by mouth 2 (two) times daily. Patient requests that you let him know when the prescription is ready to be picked up.  20 tablet  0  . enzalutamide (XTANDI) 40 MG capsule Take 160 mg by mouth daily.      . metoprolol succinate (TOPROL-XL) 100 MG 24 hr tablet Take 1 tablet (100 mg total) by mouth 2 (two) times daily. Take with or immediately following a meal.  180 tablet  3  . Multiple Vitamin (MULTIVITAMIN) capsule Take 1 capsule by mouth daily.      Marland Kitchen  NITROSTAT 0.4 MG SL tablet ONE TABLET UNDER TONGUE EVERY 5 MINUTES AS NEEDED FOR CHEST PAIN  25 tablet  3  . Omega-3 Fatty Acids (FISH OIL PO) Take 1,000 mg by mouth daily.      . ramipril (ALTACE) 10 MG capsule Take 1 capsule (10 mg total) by mouth 2 (two) times daily.  10 capsule  0  . rosuvastatin (CRESTOR) 40 MG tablet Take 40 mg by mouth daily.      Marland Kitchen triamterene-hydrochlorothiazide (DYAZIDE) 37.5-25 MG per capsule Take 2 each (2 capsules total) by mouth daily.  180 capsule  3  . XARELTO 20 MG TABS tablet TAKE ONE TABLET EACH DAY  30 tablet  6    No results found for this or any previous visit (from the past 48 hour(s)). No results found.  ROS  No recent f/c/n/v/wt loss  Blood pressure 184/106, pulse 90, temperature 97.8 F (36.6 C), temperature source Oral, resp. rate 20, height 6' (1.829 m), weight 90.538 kg (199 lb 9.6 oz), SpO2 100.00%. Physical Exam  wn wd  male in nad.  A and O x 4.  Mood and affect normal.  EOMI.  Resp unlabored.  R foot with healthy skin except small ulcer over MPJ.  1+ dp and pt pulses.  Feels LT in the forefoot normally.  5/5 strength in PF ad DF of the ankle and toes.  No lymphadenopathy.  Assessment/Plan R hallux rigidus.  To OR for right hallux metatarsophalangeal joint cheilectomy.  The risks and benefits of the alternative treatment options have been discussed in detail.  The patient wishes to proceed with surgery and specifically understands risks of bleeding, infection, nerve damage, blood clots, need for additional surgery, amputation and death.   [name] 10/08/13, 7:22 AM

## 2013-10-07 NOTE — Discharge Instructions (Signed)
Jacob Simmer, MD [place]  Please read the following information regarding your care after surgery.  When your block wears off you may begin moving your big toe.  Flex and extend it at least 10 times twice a day.  Medications  You only need a prescription for the narcotic pain medicine (ex. oxycodone, Percocet, Norco).  All of the other medicines listed below are available over the counter. X acetominophen (Tylenol) 650 mg every 4-6 hours as you need for minor pain X tramadol as prescribed for more sever pain   Narcotic pain medicine (ex. oxycodone, Percocet, Vicodin) will cause constipation.  To prevent this problem, take the following medicines while you are taking any pain medicine. X docusate sodium (Colace) 100 mg twice a day X senna (Senokot) 2 tablets twice a day  You may resume Xarelto tomorrow.  Weight Bearing X Bear weight when you are able on your operated leg or foot in the post-op shoe.  Cast / Splint / Dressing X Keep your dressing clean and dry.  Dont put anything (coat hanger, pencil, etc) down inside of it.  If it gets damp, use a hair dryer on the cool setting to dry it.  If it gets soaked, call the office to schedule an appointment for a cast change.  After your dressing, cast or splint is removed; you may shower, but do not soak or scrub the wound.  Allow the water to run over it, and then gently pat it dry.  Swelling It is normal for you to have swelling where you had surgery.  To reduce swelling and pain, keep your toes above your nose for at least 3 days after surgery.  It may be necessary to keep your foot or leg elevated for several weeks.  If it hurts, it should be elevated.  Follow Up Call my office at [phone] when you are discharged from the hospital or surgery center to schedule an appointment to be seen two weeks after surgery.  Call my office at [phone] if you develop a fever >101.5 F, nausea, vomiting, bleeding from the surgical  site or severe pain.     Regional Anesthesia Blocks  1. Numbness or the inability to move the "blocked" extremity may last from 3-48 hours after placement. The length of time depends on the medication injected and your individual response to the medication. If the numbness is not going away after 48 hours, call your surgeon.  2. The extremity that is blocked will need to be protected until the numbness is gone and the  Strength has returned. Because you cannot feel it, you will need to take extra care to avoid injury. Because it may be weak, you may have difficulty moving it or using it. You may not know what position it is in without looking at it while the block is in effect.  3. For blocks in the legs and feet, returning to weight bearing and walking needs to be done carefully. You will need to wait until the numbness is entirely gone and the strength has returned. You should be able to move your leg and foot normally before you try and bear weight or walk. You will need someone to be with you when you first try to ensure you do not fall and possibly risk injury.  4. Bruising and tenderness at the needle site are common side effects and will resolve in a few days.  5. Persistent numbness or new problems with movement should be communicated to the  surgeon or the [facility] [phone] [place] [phone]).   Post Anesthesia Home Care Instructions  Activity: Get plenty of rest for the remainder of the day. A responsible adult should stay with you for 24 hours following the procedure.  For the next 24 hours, DO NOT: -Drive a car -Paediatric nurse -Drink alcoholic beverages -Take any medication unless instructed by your physician -Make any legal decisions or sign important papers.  Meals: Start with liquid foods such as gelatin or soup. Progress to regular foods as tolerated. Avoid greasy, spicy, heavy foods. If nausea and/or vomiting occur, drink only  clear liquids until the nausea and/or vomiting subsides. Call your physician if vomiting continues.  Special Instructions/Symptoms: Your throat may feel dry or sore from the anesthesia or the breathing tube placed in your throat during surgery. If this causes discomfort, gargle with warm salt water. The discomfort should disappear within 24 hours.

## 2013-10-07 NOTE — Brief Op Note (Signed)
10/07/2013  8:18 AM  PATIENT:  Jacob Becker  80 y.o. male  PRE-OPERATIVE DIAGNOSIS:  RIGHT HALLUX RIGIDUS  POST-OPERATIVE DIAGNOSIS:  RIGHT HALLUX RIGIDUS  Procedure(s): RIGHT HALLUX metatarsophalangeal joint CHEILECTOMY  SURGEON:  [name], MD  ASSISTANT:  Lorin Mercy, PA-C  ANESTHESIA:   Popliteal block  EBL:  minimal   TOURNIQUET:   Total Tourniquet Time Documented: Calf (Right) - 24 minutes Total: Calf (Right) - 24 minutes   COMPLICATIONS:  None apparent  DISPOSITION:  Extubated, awake and stable to recovery.  DICTATION ID:  [id number]

## 2013-10-07 NOTE — Progress Notes (Signed)
Popliteal block by Dr Marcie Bal, 43mcg Fentanyl given IVP by CRNA for comfort and charted on OP record

## 2013-10-07 NOTE — Transfer of Care (Signed)
Immediate Anesthesia Transfer of Care Note  Patient: Jacob Becker  Procedure(s) Performed: Procedure(s): RIGHT HALLUX CHEILECTOMY (Right)  Patient Location: PACU  Anesthesia Type:MAC  Level of Consciousness: awake, alert , oriented and patient cooperative  Airway & Oxygen Therapy: Patient Spontanous Breathing  Post-op Assessment: Report given to PACU RN and Post -op Vital signs reviewed and stable  Post vital signs: Reviewed and stable  Complications: No apparent anesthesia complications

## 2013-10-07 NOTE — Op Note (Signed)
NAME:  MEHKI, KLUMPP NO.:  [id number]  MEDICAL RECORD NO.:  [id number]  LOCATION:                                 FACILITY:  PHYSICIAN:  [name], MD        DATE OF BIRTH:  [birth year]  DATE OF PROCEDURE:  10/07/2013 DATE OF DISCHARGE:                              OPERATIVE REPORT   PREOPERATIVE DIAGNOSIS:  Right hallux rigidus.  POSTOPERATIVE DIAGNOSIS:  Right hallux rigidus.  PROCEDURE:  Right hallux metatarsophalangeal joint condylectomy.  SURGEON:  [name], MD.  ASSISTANT:  Lorin Mercy, PA-C.  ANESTHESIA:  Popliteal block.  ESTIMATED BLOOD LOSS:  Minimal.  TOURNIQUET TIME:  24 minutes at 250 mmHg.  COMPLICATIONS:  None apparent.  DISPOSITION:  Extubated, awake, and stable to recovery.  INDICATIONS FOR PROCEDURE:  The patient is an 80 year old male with past medical history significant for atrial fibrillation on Xarelto.  He complains of a recurrent ulcer at the dorsal aspect of his forefoot. This has had significant difficulty healing due to underlying osteophytes.  He has significant hallux rigidus changes on x-rays with a large dorsal osteophytes.  He presents now for operative treatment of this painful condition.  He understands the risks and benefits, the alternative treatment options, and elects surgical treatment.  He specifically understands risks of bleeding, infection, nerve damage, blood clots, need for additional surgery, continued pain, recurrent ulcer, amputation, and death.  PROCEDURE IN DETAIL:  After preoperative consent was obtained, the correct operative site was identified.  The patient was brought to the operating room and placed supine on the operating table.  Popliteal block had been administered by Dr. Marcie Bal.  The right lower extremity was prepped and draped in standard sterile fashion with tourniquet around the calf.  The extremity was exsanguinated and the tourniquet was inflated to 250 mmHg.  A  longitudinal incision was made over the MTP joint and the dorsal ulcer was excised in its entirety.  Sharp dissection was carried down through the skin and subcutaneous tissue. The joint capsule was incised longitudinally and elevated medially and laterally.  A rongeur was then used to remove osteophytes from the dorsal aspect of the proximal phalanx base.  There was a large fragment of bone there that was loose.  This was excised in its entirety.  The joint was noted to have full-thickness cartilage loss on both sides. The dorsal osteophytes at the head of the metatarsal were resected with an oscillating saw.  The cut surfaces of bone were smoothed with a rongeur.  The wound was irrigated copiously.  A Joker elevator was passed into the articulation between the sesamoid to the metatarsal head.  Dorsiflexion at that point was 75 degrees and plantar flexion was about 45 degrees.  The wound was again irrigated copiously.  The dorsal joint capsule was repaired with simple sutures of 3-0 Monocryl. Subcutaneous tissue was approximated with inverted simple sutures of 3-0 Monocryl.  Running 3-0 nylon was used to close the skin incision. Sterile dressings were applied followed by compression wrap.  Tourniquet was released at 24 minutes.  The patient was then transported to the recovery room in stable condition.  Lorin Mercy, PA-C, was  present, scrubbed for the duration of the case. Her assistance was essentially gaining and maintaining exposure, performing the operation, closing and dressing the wounds.     [name], MD     JH/MEDQ  D:  10/07/2013  T:  10/07/2013  Job:  [id number]

## 2013-10-07 NOTE — Anesthesia Postprocedure Evaluation (Signed)
  Anesthesia Post-op Note  Patient: Jacob Becker  Procedure(s) Performed: Procedure(s): RIGHT HALLUX CHEILECTOMY (Right)  Patient Location: PACU  Anesthesia Type: MAC and block  Level of Consciousness: awake and alert   Airway and Oxygen Therapy: Patient Spontanous Breathing  Post-op Pain: none  Post-op Assessment: Post-op Vital signs reviewed, Patient's Cardiovascular Status Stable and Respiratory Function Stable  Post-op Vital Signs: Reviewed  Filed Vitals:   10/07/13 0900  BP: 145/58  Pulse: 76  Temp: 37 C  Resp: 18    Complications: No apparent anesthesia complications

## 2013-10-07 NOTE — Progress Notes (Signed)
Assisted Dr. Hodierne with right, ultrasound guided, popliteal block. Side rails up, monitors on throughout procedure. See vital signs in flow sheet. Tolerated Procedure well. 

## 2013-10-07 NOTE — Anesthesia Procedure Notes (Addendum)
Anesthesia Regional Block:  Popliteal block  Pre-Anesthetic Checklist: ,, timeout performed, Correct Patient, Correct Site, Correct Laterality, Correct Procedure, Correct Position, site marked, Risks and benefits discussed,  Surgical consent,  Pre-op evaluation,  At surgeon's request and post-op pain management  Laterality: Right  Prep: chloraprep       Needles:  Injection technique: Single-shot  Needle Type: Echogenic Stimulator Needle     Needle Length: 9cm 9 cm Needle Gauge: 21 and 21 G    Additional Needles:  Procedures: ultrasound guided (picture in chart) and nerve stimulator Popliteal block  Nerve Stimulator or Paresthesia:  Response: plantar flexion of foot, 0.45 mA,   Additional Responses:   Narrative:  Start time: 10/07/2013 7:12 AM End time: 10/07/2013 7:27 AM Injection made incrementally with aspirations every 5 mL.  Performed by: Personally  Anesthesiologist: Dr Marcie Bal  Additional Notes: Functioning IV was confirmed and monitors were applied.  A 5mm 21ga Arrow echogenic stimulator needle was used. Sterile prep and drape,hand hygiene and sterile gloves were used.  Negative aspiration and negative test dose prior to incremental administration of local anesthetic. The patient tolerated the procedure well.  Ultrasound guidance: relevent anatomy identified, needle position confirmed, local anesthetic spread visualized around nerve(s), vascular puncture avoided.  Image printed for medical record.    Procedure Name: MAC Date/Time: 10/07/2013 7:40 AM Performed by: Jonita Hirota Pre-anesthesia Checklist: Patient identified, Emergency Drugs available, Suction available, Patient being monitored and Timeout performed Patient Re-evaluated:Patient Re-evaluated prior to inductionOxygen Delivery Method: Simple face mask

## 2013-10-08 ENCOUNTER — Encounter (HOSPITAL_BASED_OUTPATIENT_CLINIC_OR_DEPARTMENT_OTHER): Payer: Self-pay | Admitting: Orthopedic Surgery

## 2013-10-12 DIAGNOSIS — Z23 Encounter for immunization: Secondary | ICD-10-CM | POA: Diagnosis not present

## 2013-11-01 DIAGNOSIS — Z8546 Personal history of malignant neoplasm of prostate: Secondary | ICD-10-CM | POA: Diagnosis not present

## 2013-11-01 DIAGNOSIS — R5383 Other fatigue: Secondary | ICD-10-CM | POA: Diagnosis not present

## 2013-11-01 DIAGNOSIS — N529 Male erectile dysfunction, unspecified: Secondary | ICD-10-CM | POA: Diagnosis not present

## 2013-11-01 DIAGNOSIS — R972 Elevated prostate specific antigen [PSA]: Secondary | ICD-10-CM | POA: Diagnosis not present

## 2013-11-01 DIAGNOSIS — C7951 Secondary malignant neoplasm of bone: Secondary | ICD-10-CM | POA: Diagnosis not present

## 2013-11-01 DIAGNOSIS — Z936 Other artificial openings of urinary tract status: Secondary | ICD-10-CM | POA: Diagnosis not present

## 2013-11-01 DIAGNOSIS — R351 Nocturia: Secondary | ICD-10-CM | POA: Diagnosis not present

## 2013-11-01 DIAGNOSIS — Z85828 Personal history of other malignant neoplasm of skin: Secondary | ICD-10-CM | POA: Diagnosis not present

## 2013-11-01 DIAGNOSIS — C61 Malignant neoplasm of prostate: Secondary | ICD-10-CM | POA: Diagnosis not present

## 2013-11-01 DIAGNOSIS — M7730 Calcaneal spur, unspecified foot: Secondary | ICD-10-CM | POA: Diagnosis not present

## 2013-12-06 ENCOUNTER — Telehealth: Payer: Self-pay | Admitting: Interventional Cardiology

## 2013-12-06 NOTE — Telephone Encounter (Signed)
New problem   Pt is having tooth extraction on 12/29/13\, pt need to know if he should stop taking Xarelto and if so how far in advance.  Please leave message if no one is there, Please advise.

## 2013-12-06 NOTE — Telephone Encounter (Signed)
Will route to Dr.Smith for adv

## 2013-12-06 NOTE — Telephone Encounter (Signed)
Stops her Xarelto 48 hours prior to extraction resume, approximately  48 hours later

## 2013-12-07 NOTE — Telephone Encounter (Signed)
   Pt wife given Dr.Smith instructions. Stops her Xarelto 48 hours prior to extraction resume, approximately 48 hours later    Pt wife verbalized understanding

## 2013-12-07 NOTE — Telephone Encounter (Signed)
called to give pt Dr.Smith recommendations on holding Xarelto.lmtcb

## 2013-12-07 NOTE — Telephone Encounter (Signed)
Follow up     Returning Lisa's call.  Please call after 4pm

## 2013-12-22 ENCOUNTER — Other Ambulatory Visit (HOSPITAL_COMMUNITY): Payer: Self-pay | Admitting: Internal Medicine

## 2013-12-22 DIAGNOSIS — C61 Malignant neoplasm of prostate: Secondary | ICD-10-CM

## 2014-01-03 ENCOUNTER — Encounter (HOSPITAL_COMMUNITY)
Admission: RE | Admit: 2014-01-03 | Discharge: 2014-01-03 | Disposition: A | Discharge status: Home / Self Care | Payer: Medicare Other | Source: Ambulatory Visit | Attending: Internal Medicine | Admitting: Internal Medicine

## 2014-01-03 ENCOUNTER — Ambulatory Visit (HOSPITAL_COMMUNITY)
Admission: RE | Admit: 2014-01-03 | Discharge: 2014-01-03 | Disposition: A | Discharge status: Home / Self Care | Payer: Medicare Other | Source: Ambulatory Visit | Attending: Internal Medicine | Admitting: Internal Medicine

## 2014-01-03 DIAGNOSIS — C61 Malignant neoplasm of prostate: Secondary | ICD-10-CM | POA: Diagnosis not present

## 2014-01-03 DIAGNOSIS — C7951 Secondary malignant neoplasm of bone: Secondary | ICD-10-CM | POA: Insufficient documentation

## 2014-01-03 MED ORDER — IOHEXOL 300 MG/ML  SOLN
80.0000 mL | Freq: Once | INTRAMUSCULAR | Status: AC | PRN
  Administered 2014-01-03: 80 mL via INTRAVENOUS

## 2014-01-03 MED ORDER — TECHNETIUM TC 99M MEDRONATE IV KIT
25.0000 | PACK | Freq: Once | INTRAVENOUS | Status: AC | PRN
  Administered 2014-01-03: 25 via INTRAVENOUS

## 2014-01-06 DIAGNOSIS — C44321 Squamous cell carcinoma of skin of nose: Secondary | ICD-10-CM | POA: Diagnosis not present

## 2014-01-06 DIAGNOSIS — C44329 Squamous cell carcinoma of skin of other parts of face: Secondary | ICD-10-CM | POA: Diagnosis not present

## 2014-01-06 DIAGNOSIS — D485 Neoplasm of uncertain behavior of skin: Secondary | ICD-10-CM | POA: Diagnosis not present

## 2014-01-06 DIAGNOSIS — Z85828 Personal history of other malignant neoplasm of skin: Secondary | ICD-10-CM | POA: Diagnosis not present

## 2014-01-06 DIAGNOSIS — L570 Actinic keratosis: Secondary | ICD-10-CM | POA: Diagnosis not present

## 2014-01-10 DIAGNOSIS — N529 Male erectile dysfunction, unspecified: Secondary | ICD-10-CM | POA: Diagnosis not present

## 2014-01-10 DIAGNOSIS — R972 Elevated prostate specific antigen [PSA]: Secondary | ICD-10-CM | POA: Diagnosis not present

## 2014-01-10 DIAGNOSIS — R5383 Other fatigue: Secondary | ICD-10-CM | POA: Diagnosis not present

## 2014-01-10 DIAGNOSIS — Z8546 Personal history of malignant neoplasm of prostate: Secondary | ICD-10-CM | POA: Diagnosis not present

## 2014-01-10 DIAGNOSIS — R351 Nocturia: Secondary | ICD-10-CM | POA: Diagnosis not present

## 2014-01-10 DIAGNOSIS — Z85828 Personal history of other malignant neoplasm of skin: Secondary | ICD-10-CM | POA: Diagnosis not present

## 2014-01-10 DIAGNOSIS — C7951 Secondary malignant neoplasm of bone: Secondary | ICD-10-CM | POA: Diagnosis not present

## 2014-01-10 DIAGNOSIS — Z936 Other artificial openings of urinary tract status: Secondary | ICD-10-CM | POA: Diagnosis not present

## 2014-01-10 DIAGNOSIS — I4891 Unspecified atrial fibrillation: Secondary | ICD-10-CM | POA: Diagnosis not present

## 2014-01-10 DIAGNOSIS — C61 Malignant neoplasm of prostate: Secondary | ICD-10-CM | POA: Diagnosis not present

## 2014-02-14 ENCOUNTER — Other Ambulatory Visit: Payer: Self-pay | Admitting: Interventional Cardiology

## 2014-03-04 DIAGNOSIS — Z85828 Personal history of other malignant neoplasm of skin: Secondary | ICD-10-CM | POA: Diagnosis not present

## 2014-03-04 DIAGNOSIS — C7951 Secondary malignant neoplasm of bone: Secondary | ICD-10-CM | POA: Diagnosis not present

## 2014-03-04 DIAGNOSIS — Z8546 Personal history of malignant neoplasm of prostate: Secondary | ICD-10-CM | POA: Diagnosis not present

## 2014-03-07 DIAGNOSIS — I4891 Unspecified atrial fibrillation: Secondary | ICD-10-CM | POA: Diagnosis not present

## 2014-03-07 DIAGNOSIS — Z936 Other artificial openings of urinary tract status: Secondary | ICD-10-CM | POA: Diagnosis not present

## 2014-03-07 DIAGNOSIS — R5383 Other fatigue: Secondary | ICD-10-CM | POA: Diagnosis not present

## 2014-03-07 DIAGNOSIS — Z85828 Personal history of other malignant neoplasm of skin: Secondary | ICD-10-CM | POA: Diagnosis not present

## 2014-03-07 DIAGNOSIS — C7951 Secondary malignant neoplasm of bone: Secondary | ICD-10-CM | POA: Diagnosis not present

## 2014-03-07 DIAGNOSIS — C61 Malignant neoplasm of prostate: Secondary | ICD-10-CM | POA: Diagnosis not present

## 2014-03-07 DIAGNOSIS — Z8546 Personal history of malignant neoplasm of prostate: Secondary | ICD-10-CM | POA: Diagnosis not present

## 2014-03-07 DIAGNOSIS — M545 Low back pain: Secondary | ICD-10-CM | POA: Diagnosis not present

## 2014-03-07 DIAGNOSIS — Z79818 Long term (current) use of other agents affecting estrogen receptors and estrogen levels: Secondary | ICD-10-CM | POA: Diagnosis not present

## 2014-03-07 DIAGNOSIS — N529 Male erectile dysfunction, unspecified: Secondary | ICD-10-CM | POA: Diagnosis not present

## 2014-03-07 DIAGNOSIS — R972 Elevated prostate specific antigen [PSA]: Secondary | ICD-10-CM | POA: Diagnosis not present

## 2014-03-07 DIAGNOSIS — R351 Nocturia: Secondary | ICD-10-CM | POA: Diagnosis not present

## 2014-03-21 ENCOUNTER — Other Ambulatory Visit: Payer: Self-pay | Admitting: Interventional Cardiology

## 2014-03-21 ENCOUNTER — Other Ambulatory Visit: Payer: Self-pay

## 2014-03-21 MED ORDER — RIVAROXABAN 20 MG PO TABS: ORAL_TABLET | ORAL | Status: DC

## 2014-04-04 ENCOUNTER — Other Ambulatory Visit (HOSPITAL_COMMUNITY): Payer: Self-pay | Admitting: Internal Medicine

## 2014-04-04 DIAGNOSIS — C61 Malignant neoplasm of prostate: Secondary | ICD-10-CM

## 2014-04-06 DIAGNOSIS — C44329 Squamous cell carcinoma of skin of other parts of face: Secondary | ICD-10-CM | POA: Diagnosis not present

## 2014-04-06 DIAGNOSIS — Z85828 Personal history of other malignant neoplasm of skin: Secondary | ICD-10-CM | POA: Diagnosis not present

## 2014-04-15 ENCOUNTER — Other Ambulatory Visit: Payer: Self-pay | Admitting: Interventional Cardiology

## 2014-04-25 ENCOUNTER — Other Ambulatory Visit: Payer: Self-pay

## 2014-04-25 ENCOUNTER — Other Ambulatory Visit: Payer: Self-pay | Admitting: Interventional Cardiology

## 2014-04-25 MED ORDER — TRIAMTERENE-HCTZ 37.5-25 MG PO CAPS: 2.0000 | ORAL_CAPSULE | Freq: Every day | ORAL | Status: DC

## 2014-04-25 MED ORDER — RAMIPRIL 10 MG PO CAPS: ORAL_CAPSULE | ORAL | Status: DC

## 2014-05-03 ENCOUNTER — Encounter (HOSPITAL_COMMUNITY): Payer: Medicare Other

## 2014-05-03 ENCOUNTER — Ambulatory Visit (HOSPITAL_COMMUNITY)
Admission: RE | Admit: 2014-05-03 | Discharge: 2014-05-03 | Disposition: A | Discharge status: Home / Self Care | Payer: Medicare Other | Source: Ambulatory Visit | Attending: Internal Medicine | Admitting: Internal Medicine

## 2014-05-03 ENCOUNTER — Encounter (HOSPITAL_COMMUNITY)
Admission: RE | Admit: 2014-05-03 | Discharge: 2014-05-03 | Disposition: A | Discharge status: Home / Self Care | Payer: Medicare Other | Source: Ambulatory Visit | Attending: Internal Medicine | Admitting: Internal Medicine

## 2014-05-03 ENCOUNTER — Encounter (HOSPITAL_COMMUNITY): Payer: Self-pay

## 2014-05-03 DIAGNOSIS — C61 Malignant neoplasm of prostate: Secondary | ICD-10-CM

## 2014-05-03 DIAGNOSIS — C7951 Secondary malignant neoplasm of bone: Secondary | ICD-10-CM | POA: Diagnosis not present

## 2014-05-03 MED ORDER — TECHNETIUM TC 99M MEDRONATE IV KIT
25.0000 | PACK | Freq: Once | INTRAVENOUS | Status: AC | PRN
  Administered 2014-05-03: 25 via INTRAVENOUS

## 2014-05-03 MED ORDER — IOHEXOL 300 MG/ML  SOLN
80.0000 mL | Freq: Once | INTRAMUSCULAR | Status: AC | PRN
  Administered 2014-05-03: 80 mL via INTRAVENOUS

## 2014-05-05 ENCOUNTER — Encounter (HOSPITAL_COMMUNITY): Payer: Medicare Other

## 2014-05-05 ENCOUNTER — Ambulatory Visit (HOSPITAL_COMMUNITY): Payer: Medicare Other

## 2014-05-13 DIAGNOSIS — Z8546 Personal history of malignant neoplasm of prostate: Secondary | ICD-10-CM | POA: Diagnosis not present

## 2014-05-13 DIAGNOSIS — C7951 Secondary malignant neoplasm of bone: Secondary | ICD-10-CM | POA: Diagnosis not present

## 2014-05-13 DIAGNOSIS — Z85828 Personal history of other malignant neoplasm of skin: Secondary | ICD-10-CM | POA: Diagnosis not present

## 2014-05-16 DIAGNOSIS — Z8546 Personal history of malignant neoplasm of prostate: Secondary | ICD-10-CM | POA: Diagnosis not present

## 2014-05-16 DIAGNOSIS — Z79818 Long term (current) use of other agents affecting estrogen receptors and estrogen levels: Secondary | ICD-10-CM | POA: Diagnosis not present

## 2014-05-16 DIAGNOSIS — M545 Low back pain: Secondary | ICD-10-CM | POA: Diagnosis not present

## 2014-05-16 DIAGNOSIS — I4891 Unspecified atrial fibrillation: Secondary | ICD-10-CM | POA: Diagnosis not present

## 2014-05-16 DIAGNOSIS — C7951 Secondary malignant neoplasm of bone: Secondary | ICD-10-CM | POA: Diagnosis not present

## 2014-05-16 DIAGNOSIS — Z936 Other artificial openings of urinary tract status: Secondary | ICD-10-CM | POA: Diagnosis not present

## 2014-05-16 DIAGNOSIS — R5383 Other fatigue: Secondary | ICD-10-CM | POA: Diagnosis not present

## 2014-05-16 DIAGNOSIS — R972 Elevated prostate specific antigen [PSA]: Secondary | ICD-10-CM | POA: Diagnosis not present

## 2014-05-16 DIAGNOSIS — Z85828 Personal history of other malignant neoplasm of skin: Secondary | ICD-10-CM | POA: Diagnosis not present

## 2014-05-16 DIAGNOSIS — C61 Malignant neoplasm of prostate: Secondary | ICD-10-CM | POA: Diagnosis not present

## 2014-05-16 DIAGNOSIS — N529 Male erectile dysfunction, unspecified: Secondary | ICD-10-CM | POA: Diagnosis not present

## 2014-05-16 DIAGNOSIS — R351 Nocturia: Secondary | ICD-10-CM | POA: Diagnosis not present

## 2014-05-23 DIAGNOSIS — H524 Presbyopia: Secondary | ICD-10-CM | POA: Diagnosis not present

## 2014-05-23 DIAGNOSIS — H01001 Unspecified blepharitis right upper eyelid: Secondary | ICD-10-CM | POA: Diagnosis not present

## 2014-05-23 DIAGNOSIS — H25813 Combined forms of age-related cataract, bilateral: Secondary | ICD-10-CM | POA: Diagnosis not present

## 2014-05-23 DIAGNOSIS — H01004 Unspecified blepharitis left upper eyelid: Secondary | ICD-10-CM | POA: Diagnosis not present

## 2014-06-02 DIAGNOSIS — L570 Actinic keratosis: Secondary | ICD-10-CM | POA: Diagnosis not present

## 2014-06-02 DIAGNOSIS — Z85828 Personal history of other malignant neoplasm of skin: Secondary | ICD-10-CM | POA: Diagnosis not present

## 2014-06-07 DIAGNOSIS — H2181 Floppy iris syndrome: Secondary | ICD-10-CM | POA: Diagnosis not present

## 2014-06-07 DIAGNOSIS — H2511 Age-related nuclear cataract, right eye: Secondary | ICD-10-CM | POA: Diagnosis not present

## 2014-06-07 DIAGNOSIS — H25041 Posterior subcapsular polar age-related cataract, right eye: Secondary | ICD-10-CM | POA: Diagnosis not present

## 2014-06-07 DIAGNOSIS — H52202 Unspecified astigmatism, left eye: Secondary | ICD-10-CM | POA: Diagnosis not present

## 2014-06-07 DIAGNOSIS — H25011 Cortical age-related cataract, right eye: Secondary | ICD-10-CM | POA: Diagnosis not present

## 2014-06-07 DIAGNOSIS — H25812 Combined forms of age-related cataract, left eye: Secondary | ICD-10-CM | POA: Diagnosis not present

## 2014-06-21 DIAGNOSIS — H25011 Cortical age-related cataract, right eye: Secondary | ICD-10-CM | POA: Diagnosis not present

## 2014-06-21 DIAGNOSIS — Z79899 Other long term (current) drug therapy: Secondary | ICD-10-CM | POA: Diagnosis not present

## 2014-06-21 DIAGNOSIS — H25811 Combined forms of age-related cataract, right eye: Secondary | ICD-10-CM | POA: Diagnosis not present

## 2014-06-21 DIAGNOSIS — H25041 Posterior subcapsular polar age-related cataract, right eye: Secondary | ICD-10-CM | POA: Diagnosis not present

## 2014-06-21 DIAGNOSIS — H2511 Age-related nuclear cataract, right eye: Secondary | ICD-10-CM | POA: Diagnosis not present

## 2014-07-07 DIAGNOSIS — I10 Essential (primary) hypertension: Secondary | ICD-10-CM | POA: Insufficient documentation

## 2014-07-07 DIAGNOSIS — C61 Malignant neoplasm of prostate: Secondary | ICD-10-CM | POA: Insufficient documentation

## 2014-07-07 DIAGNOSIS — Z7901 Long term (current) use of anticoagulants: Secondary | ICD-10-CM | POA: Insufficient documentation

## 2014-07-07 DIAGNOSIS — E785 Hyperlipidemia, unspecified: Secondary | ICD-10-CM | POA: Insufficient documentation

## 2014-07-07 DIAGNOSIS — I4891 Unspecified atrial fibrillation: Secondary | ICD-10-CM | POA: Insufficient documentation

## 2014-07-07 DIAGNOSIS — I2510 Atherosclerotic heart disease of native coronary artery without angina pectoris: Secondary | ICD-10-CM | POA: Insufficient documentation

## 2014-07-08 ENCOUNTER — Encounter: Payer: Self-pay | Admitting: Interventional Cardiology

## 2014-07-08 ENCOUNTER — Ambulatory Visit (INDEPENDENT_AMBULATORY_CARE_PROVIDER_SITE_OTHER): Payer: Medicare Other | Admitting: Interventional Cardiology

## 2014-07-08 VITALS — BP 152/78 | HR 83 | Ht 72.0 in | Wt 199.0 lb

## 2014-07-08 DIAGNOSIS — I10 Essential (primary) hypertension: Secondary | ICD-10-CM | POA: Diagnosis not present

## 2014-07-08 DIAGNOSIS — I2510 Atherosclerotic heart disease of native coronary artery without angina pectoris: Secondary | ICD-10-CM

## 2014-07-08 DIAGNOSIS — I480 Paroxysmal atrial fibrillation: Secondary | ICD-10-CM | POA: Diagnosis not present

## 2014-07-08 DIAGNOSIS — Z7901 Long term (current) use of anticoagulants: Secondary | ICD-10-CM | POA: Diagnosis not present

## 2014-07-08 DIAGNOSIS — E785 Hyperlipidemia, unspecified: Secondary | ICD-10-CM

## 2014-07-08 DIAGNOSIS — I4891 Unspecified atrial fibrillation: Secondary | ICD-10-CM | POA: Diagnosis not present

## 2014-07-08 DIAGNOSIS — C61 Malignant neoplasm of prostate: Secondary | ICD-10-CM

## 2014-07-08 MED ORDER — NITROGLYCERIN 0.4 MG SL SUBL: SUBLINGUAL_TABLET | SUBLINGUAL | Status: DC

## 2014-07-08 NOTE — Progress Notes (Signed)
Cardiology Office Note   Date:  07/08/2014   ID:  Jacob Becker, DOB 1929-08-28, MRN [id number]  PCP:  Mathews Argyle, MD  Cardiologist:  Sinclair Grooms, MD   Chief Complaint  Patient presents with  . Coronary Artery Disease      History of Present Illness: Jacob Becker is a 81 y.o. male who presents for essential hypertension, hyperlipidemia, coronary artery disease, chronic atrial fibrillation and chronic anticoagulation therapy.  History is doing well. He has remained active. 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 He denies orthopnea, PND, angina, syncope, and edema. He is somewhat concerned about his blood pressure because he has had some recordings above 150.  He denies edema. There's been no blood in urine or stool.    Past Medical History  Diagnosis Date  . Wears glasses   . Cancer     prostate  . Metastatic bone cancer     from prostate  . Dysrhythmia     af  . AF (atrial fibrillation)   . Hypertension   . Hyperlipemia   . Arthritis     Past Surgical History  Procedure Laterality Date  . Hernia repair      rt and lt ing  . Cystoscopy prostate w/ laser  2012    turp  . Tonsillectomy    . Knee arthroscopy  2005    right  . Colonoscopy    . Cheilectomy Right 10/07/2013    Procedure: RIGHT HALLUX CHEILECTOMY;  Surgeon: [name], MD;  Location: [place];  Service: Orthopedics;  Laterality: Right;     Current Outpatient Prescriptions  Medication Sig Dispense Refill  . amLODipine (NORVASC) 5 MG tablet TAKE 1 BY MOUTH DAILY 90 tablet 0  . Calcium-Magnesium-Vitamin D (CALCIUM 500 PO) Take 500 mg by mouth 2 (two) times daily.    . Coenzyme Q10 (CO Q 10) 100 MG CAPS Take 100 mg by mouth daily.    . colesevelam (WELCHOL) 625 MG tablet Take 625 mg by mouth 2 (two) times daily with a meal.    . enzalutamide (XTANDI) 40 MG capsule Take 160 mg by mouth daily.    . metoprolol  succinate (TOPROL-XL) 100 MG 24 hr tablet TAKE 1 BY MOUTH TWICE DAILY WITH OR IMMEDIATELY FOLLOWING A MEAL (PT NEEDS TO CONTACT OFFICE TO SCHEDULE APPOINTMENT) 60 tablet 0  . Multiple Vitamin (MULTIVITAMIN) capsule Take 1 capsule by mouth daily.    . nitroGLYCERIN (NITROSTAT) 0.4 MG SL tablet ONE TABLET UNDER TONGUE EVERY 5 MINUTES AS NEEDED FOR CHEST PAIN 25 tablet 3  . Omega-3 Fatty Acids (FISH OIL PO) Take 1,000 mg by mouth daily.    . ramipril (ALTACE) 10 MG capsule TAKE 1 BY MOUTH TWICE DAILY 180 capsule 1  . rivaroxaban (XARELTO) 20 MG TABS tablet TAKE ONE TABLET EACH DAY 30 tablet 6  . rosuvastatin (CRESTOR) 40 MG tablet Take 40 mg by mouth daily.    . traMADol (ULTRAM) 50 MG tablet Take 1 tablet (50 mg total) by mouth every 6 (six) hours as needed. 30 tablet 0  . triamterene-hydrochlorothiazide (DYAZIDE) 37.5-25 MG per capsule Take 2 each (2 capsules total) by mouth daily. 180 capsule 1   No current facility-administered medications for this visit.    Allergies:   Review of patient's allergies indicates no known allergies.    Social History:  The patient  reports that he quit smoking about 42 years  ago. He does not have any smokeless tobacco history on file. He reports that he drinks alcohol. He reports that he does not use illicit drugs.   Family History:  The patient's family history includes Heart failure in his father.    ROS:  Please see the history of present illness.   Otherwise, review of systems are positive for status post bilateral cataracts which is markedly improved his vision. He has occasional palpitations..   All other systems are reviewed and negative.    PHYSICAL EXAM: VS:  BP 152/78 mmHg  Pulse 83  Ht 6' (1.829 m)  Wt 90.266 kg (199 lb)  BMI 26.98 kg/m2 , BMI Body mass index is 26.98 kg/(m^2). GEN: Well nourished, well developed, in no acute distress HEENT: normal Neck: no JVD, carotid bruits, or masses Cardiac: IIRR; no murmurs, rubs, or gallops,no edema    Respiratory:  clear to auscultation bilaterally, normal work of breathing GI: soft, nontender, nondistended, + BS MS: no deformity or atrophy Skin: warm and dry, no rash Neuro:  Strength and sensation are intact Psych: euthymic mood, full affect   EKG:  EKG is ordered today. The ekg ordered today demonstrates atrial fibrillation with incomplete right bundle branch block and controlled ventricular response.   Recent Labs: 09/03/2013: ALT 14 10/07/2013: BUN 29*; Creatinine, Ser 1.20; Hemoglobin 14.6; Potassium 3.5*; Sodium 137    Lipid Panel    Component Value Date/Time   CHOL 162 09/03/2013 0749   TRIG 87.0 09/03/2013 0749   HDL 56.10 09/03/2013 0749   CHOLHDL 3 09/03/2013 0749   VLDL 17.4 09/03/2013 0749   LDLCALC 89 09/03/2013 0749      Wt Readings from Last 3 Encounters:  07/08/14 90.266 kg (199 lb)  10/07/13 90.538 kg (199 lb 9.6 oz)  07/15/13 88.451 kg (195 lb)      Other studies Reviewed: Additional studies/ records that were reviewed today include: Reviewed laboratory data obtained at [facility] and metabolic panel reveals a BUN of 34 creatinine of 1.3 PSA of 0.47 hemoglobin of 12.7. Review of the above records demonstrates: Noted above   ASSESSMENT AND PLAN:  Paroxysmal atrial fibrillation - there is controlled rate. EKG documents atrial fib   Coronary artery disease involving native coronary artery of native heart without angina pectoris - asymptomatic   Essential hypertension - controlled  Chronic anticoagulation-no bleeding complications  Hyperlipidemia  Prostate cancer-managed at [facility] with a PSA now less than 0.5     Current medicines are reviewed at length with the patient today.  The patient  has no concerns regarding medicines.  The following changes have been made:  no change  Labs/ tests ordered today include:   Orders Placed This Encounter  Procedures  . EKG 12-Lead     Disposition:   FU with HS in  1 year  Signed, Sinclair Grooms, MD  07/08/2014 8:28 AM    [facility] [place], [place], [place]  [address] Phone: [phone]; Fax: [phone]

## 2014-07-08 NOTE — Patient Instructions (Signed)

## 2014-07-18 ENCOUNTER — Other Ambulatory Visit: Payer: Self-pay

## 2014-07-18 MED ORDER — METOPROLOL SUCCINATE ER 100 MG PO TB24: 100.0000 mg | ORAL_TABLET | Freq: Every day | ORAL | Status: DC

## 2014-07-19 ENCOUNTER — Telehealth: Payer: Self-pay | Admitting: Interventional Cardiology

## 2014-07-19 MED ORDER — METOPROLOL SUCCINATE ER 100 MG PO TB24: 100.0000 mg | ORAL_TABLET | Freq: Two times a day (BID) | ORAL | Status: DC

## 2014-07-19 NOTE — Telephone Encounter (Signed)
New message      Need clarification on metoprolol succinate 100mg ---take 1 tablet daily was what was sent in but wife said 1 tablet bid

## 2014-07-19 NOTE — Telephone Encounter (Signed)
If the prior and standard therapy was Metoprolol Succ 100 mg BID, we should continue that. We must have made a refill mistake. Prior notes confirm the wife's contention.

## 2014-07-19 NOTE — Telephone Encounter (Signed)
Need clarification on metoprolol succinate 100mg -- Script prior to 6/10/ OV was for metoprolol succinate twice a day.  RX re-order after 6/10 OV was for once a day.  No change reason documented.

## 2014-07-19 NOTE — Telephone Encounter (Signed)
I called wife and let her know that Dr. Tamala Julian concurs and can return to taking metoprolol Succ 100 mg twice a day

## 2014-07-22 DIAGNOSIS — Z85828 Personal history of other malignant neoplasm of skin: Secondary | ICD-10-CM | POA: Diagnosis not present

## 2014-07-22 DIAGNOSIS — Z8546 Personal history of malignant neoplasm of prostate: Secondary | ICD-10-CM | POA: Diagnosis not present

## 2014-07-22 DIAGNOSIS — C7951 Secondary malignant neoplasm of bone: Secondary | ICD-10-CM | POA: Diagnosis not present

## 2014-07-25 DIAGNOSIS — R5383 Other fatigue: Secondary | ICD-10-CM | POA: Diagnosis not present

## 2014-07-25 DIAGNOSIS — C7951 Secondary malignant neoplasm of bone: Secondary | ICD-10-CM | POA: Diagnosis not present

## 2014-07-25 DIAGNOSIS — C61 Malignant neoplasm of prostate: Secondary | ICD-10-CM | POA: Diagnosis not present

## 2014-07-25 DIAGNOSIS — I4891 Unspecified atrial fibrillation: Secondary | ICD-10-CM | POA: Diagnosis not present

## 2014-07-25 DIAGNOSIS — Z85828 Personal history of other malignant neoplasm of skin: Secondary | ICD-10-CM | POA: Diagnosis not present

## 2014-07-25 DIAGNOSIS — N529 Male erectile dysfunction, unspecified: Secondary | ICD-10-CM | POA: Diagnosis not present

## 2014-07-25 DIAGNOSIS — R351 Nocturia: Secondary | ICD-10-CM | POA: Diagnosis not present

## 2014-07-25 DIAGNOSIS — M545 Low back pain: Secondary | ICD-10-CM | POA: Diagnosis not present

## 2014-07-25 DIAGNOSIS — R972 Elevated prostate specific antigen [PSA]: Secondary | ICD-10-CM | POA: Diagnosis not present

## 2014-07-25 DIAGNOSIS — Z8546 Personal history of malignant neoplasm of prostate: Secondary | ICD-10-CM | POA: Diagnosis not present

## 2014-07-25 DIAGNOSIS — Z936 Other artificial openings of urinary tract status: Secondary | ICD-10-CM | POA: Diagnosis not present

## 2014-07-25 DIAGNOSIS — Z79818 Long term (current) use of other agents affecting estrogen receptors and estrogen levels: Secondary | ICD-10-CM | POA: Diagnosis not present

## 2014-08-02 ENCOUNTER — Other Ambulatory Visit: Payer: Self-pay | Admitting: Interventional Cardiology

## 2014-08-02 ENCOUNTER — Other Ambulatory Visit: Payer: Self-pay | Admitting: *Deleted

## 2014-08-02 MED ORDER — AMLODIPINE BESYLATE 5 MG PO TABS: ORAL_TABLET | ORAL | Status: DC

## 2014-09-01 DIAGNOSIS — Z85828 Personal history of other malignant neoplasm of skin: Secondary | ICD-10-CM | POA: Diagnosis not present

## 2014-09-01 DIAGNOSIS — L570 Actinic keratosis: Secondary | ICD-10-CM | POA: Diagnosis not present

## 2014-09-28 ENCOUNTER — Other Ambulatory Visit (HOSPITAL_COMMUNITY): Payer: Self-pay | Admitting: Internal Medicine

## 2014-09-28 DIAGNOSIS — Z0000 Encounter for general adult medical examination without abnormal findings: Secondary | ICD-10-CM | POA: Diagnosis not present

## 2014-09-28 DIAGNOSIS — Z79899 Other long term (current) drug therapy: Secondary | ICD-10-CM | POA: Diagnosis not present

## 2014-09-28 DIAGNOSIS — C61 Malignant neoplasm of prostate: Secondary | ICD-10-CM

## 2014-09-28 DIAGNOSIS — I129 Hypertensive chronic kidney disease with stage 1 through stage 4 chronic kidney disease, or unspecified chronic kidney disease: Secondary | ICD-10-CM | POA: Diagnosis not present

## 2014-09-28 DIAGNOSIS — I482 Chronic atrial fibrillation: Secondary | ICD-10-CM | POA: Diagnosis not present

## 2014-09-28 DIAGNOSIS — D696 Thrombocytopenia, unspecified: Secondary | ICD-10-CM | POA: Diagnosis not present

## 2014-09-28 DIAGNOSIS — N183 Chronic kidney disease, stage 3 (moderate): Secondary | ICD-10-CM | POA: Diagnosis not present

## 2014-09-28 DIAGNOSIS — Z1389 Encounter for screening for other disorder: Secondary | ICD-10-CM | POA: Diagnosis not present

## 2014-09-29 DIAGNOSIS — Z79899 Other long term (current) drug therapy: Secondary | ICD-10-CM | POA: Diagnosis not present

## 2014-09-29 DIAGNOSIS — I129 Hypertensive chronic kidney disease with stage 1 through stage 4 chronic kidney disease, or unspecified chronic kidney disease: Secondary | ICD-10-CM | POA: Diagnosis not present

## 2014-10-10 DIAGNOSIS — L570 Actinic keratosis: Secondary | ICD-10-CM | POA: Diagnosis not present

## 2014-10-10 DIAGNOSIS — Z85828 Personal history of other malignant neoplasm of skin: Secondary | ICD-10-CM | POA: Diagnosis not present

## 2014-10-11 ENCOUNTER — Encounter (HOSPITAL_COMMUNITY): Payer: Medicare Other

## 2014-10-11 ENCOUNTER — Encounter (HOSPITAL_COMMUNITY)
Admission: RE | Admit: 2014-10-11 | Discharge: 2014-10-11 | Disposition: A | Discharge status: Home / Self Care | Payer: Medicare Other | Source: Ambulatory Visit | Attending: Internal Medicine | Admitting: Internal Medicine

## 2014-10-11 ENCOUNTER — Ambulatory Visit (HOSPITAL_COMMUNITY): Payer: Medicare Other

## 2014-10-11 ENCOUNTER — Ambulatory Visit (HOSPITAL_COMMUNITY)
Admission: RE | Admit: 2014-10-11 | Discharge: 2014-10-11 | Disposition: A | Discharge status: Home / Self Care | Payer: Medicare Other | Source: Ambulatory Visit | Attending: Internal Medicine | Admitting: Internal Medicine

## 2014-10-11 DIAGNOSIS — I700 Atherosclerosis of aorta: Secondary | ICD-10-CM | POA: Diagnosis not present

## 2014-10-11 DIAGNOSIS — C7951 Secondary malignant neoplasm of bone: Secondary | ICD-10-CM | POA: Diagnosis not present

## 2014-10-11 DIAGNOSIS — C61 Malignant neoplasm of prostate: Secondary | ICD-10-CM

## 2014-10-11 DIAGNOSIS — N62 Hypertrophy of breast: Secondary | ICD-10-CM | POA: Insufficient documentation

## 2014-10-11 DIAGNOSIS — I2510 Atherosclerotic heart disease of native coronary artery without angina pectoris: Secondary | ICD-10-CM | POA: Diagnosis not present

## 2014-10-11 DIAGNOSIS — K7689 Other specified diseases of liver: Secondary | ICD-10-CM | POA: Diagnosis not present

## 2014-10-11 DIAGNOSIS — R918 Other nonspecific abnormal finding of lung field: Secondary | ICD-10-CM | POA: Diagnosis not present

## 2014-10-11 DIAGNOSIS — Z8546 Personal history of malignant neoplasm of prostate: Secondary | ICD-10-CM | POA: Diagnosis not present

## 2014-10-11 MED ORDER — TECHNETIUM TC 99M MEDRONATE IV KIT
25.0000 | PACK | Freq: Once | INTRAVENOUS | Status: AC | PRN
  Administered 2014-10-11: 25 via INTRAVENOUS

## 2014-10-11 MED ORDER — IOHEXOL 300 MG/ML  SOLN
75.0000 mL | Freq: Once | INTRAMUSCULAR | Status: AC | PRN
  Administered 2014-10-11: 75 mL via INTRAVENOUS

## 2014-10-12 DIAGNOSIS — C44329 Squamous cell carcinoma of skin of other parts of face: Secondary | ICD-10-CM | POA: Diagnosis not present

## 2014-10-12 DIAGNOSIS — Z85828 Personal history of other malignant neoplasm of skin: Secondary | ICD-10-CM | POA: Diagnosis not present

## 2014-10-17 ENCOUNTER — Other Ambulatory Visit: Payer: Self-pay | Admitting: Interventional Cardiology

## 2014-11-01 ENCOUNTER — Other Ambulatory Visit: Payer: Self-pay

## 2014-11-01 MED ORDER — AMLODIPINE BESYLATE 5 MG PO TABS: ORAL_TABLET | ORAL | Status: DC

## 2014-11-04 DIAGNOSIS — Z85828 Personal history of other malignant neoplasm of skin: Secondary | ICD-10-CM | POA: Diagnosis not present

## 2014-11-04 DIAGNOSIS — Z8546 Personal history of malignant neoplasm of prostate: Secondary | ICD-10-CM | POA: Diagnosis not present

## 2014-11-04 DIAGNOSIS — C7951 Secondary malignant neoplasm of bone: Secondary | ICD-10-CM | POA: Diagnosis not present

## 2014-11-07 DIAGNOSIS — N3944 Nocturnal enuresis: Secondary | ICD-10-CM | POA: Diagnosis not present

## 2014-11-07 DIAGNOSIS — Z936 Other artificial openings of urinary tract status: Secondary | ICD-10-CM | POA: Diagnosis not present

## 2014-11-07 DIAGNOSIS — M545 Low back pain: Secondary | ICD-10-CM | POA: Diagnosis not present

## 2014-11-07 DIAGNOSIS — Z7901 Long term (current) use of anticoagulants: Secondary | ICD-10-CM | POA: Diagnosis not present

## 2014-11-07 DIAGNOSIS — C61 Malignant neoplasm of prostate: Secondary | ICD-10-CM | POA: Diagnosis not present

## 2014-11-07 DIAGNOSIS — Z79899 Other long term (current) drug therapy: Secondary | ICD-10-CM | POA: Diagnosis not present

## 2014-11-07 DIAGNOSIS — Z8546 Personal history of malignant neoplasm of prostate: Secondary | ICD-10-CM | POA: Diagnosis not present

## 2014-11-07 DIAGNOSIS — Z23 Encounter for immunization: Secondary | ICD-10-CM | POA: Diagnosis not present

## 2014-11-07 DIAGNOSIS — R351 Nocturia: Secondary | ICD-10-CM | POA: Diagnosis not present

## 2014-11-07 DIAGNOSIS — E7800 Pure hypercholesterolemia, unspecified: Secondary | ICD-10-CM | POA: Diagnosis not present

## 2014-11-07 DIAGNOSIS — C779 Secondary and unspecified malignant neoplasm of lymph node, unspecified: Secondary | ICD-10-CM | POA: Diagnosis not present

## 2014-11-07 DIAGNOSIS — C7951 Secondary malignant neoplasm of bone: Secondary | ICD-10-CM | POA: Diagnosis not present

## 2014-11-07 DIAGNOSIS — Z85828 Personal history of other malignant neoplasm of skin: Secondary | ICD-10-CM | POA: Diagnosis not present

## 2014-11-07 DIAGNOSIS — Z87440 Personal history of urinary (tract) infections: Secondary | ICD-10-CM | POA: Diagnosis not present

## 2014-11-07 DIAGNOSIS — I4891 Unspecified atrial fibrillation: Secondary | ICD-10-CM | POA: Diagnosis not present

## 2014-11-07 DIAGNOSIS — R972 Elevated prostate specific antigen [PSA]: Secondary | ICD-10-CM | POA: Diagnosis not present

## 2014-11-07 DIAGNOSIS — I10 Essential (primary) hypertension: Secondary | ICD-10-CM | POA: Diagnosis not present

## 2014-11-07 DIAGNOSIS — M5430 Sciatica, unspecified side: Secondary | ICD-10-CM | POA: Diagnosis not present

## 2014-11-07 DIAGNOSIS — Z79818 Long term (current) use of other agents affecting estrogen receptors and estrogen levels: Secondary | ICD-10-CM | POA: Diagnosis not present

## 2014-11-07 DIAGNOSIS — N529 Male erectile dysfunction, unspecified: Secondary | ICD-10-CM | POA: Diagnosis not present

## 2014-11-07 DIAGNOSIS — R5383 Other fatigue: Secondary | ICD-10-CM | POA: Diagnosis not present

## 2014-11-11 ENCOUNTER — Other Ambulatory Visit: Payer: Self-pay | Admitting: Interventional Cardiology

## 2014-11-11 ENCOUNTER — Telehealth: Payer: Self-pay | Admitting: Interventional Cardiology

## 2014-11-11 NOTE — Telephone Encounter (Signed)
   STAT if patient is at the pharmacy , call can be transferred to refill team.   1. Which medications need to be refilled? Ramipril   2. Which pharmacy/location is medication to be sent to? [facility]- [phone]  3. Do they need a 30 day or 90 day supply? [place]

## 2014-11-11 NOTE — Telephone Encounter (Signed)
Already sent in per e-rx request from pharmacy

## 2014-12-09 ENCOUNTER — Other Ambulatory Visit: Payer: Self-pay | Admitting: Interventional Cardiology

## 2015-01-17 DIAGNOSIS — Z85828 Personal history of other malignant neoplasm of skin: Secondary | ICD-10-CM | POA: Diagnosis not present

## 2015-01-17 DIAGNOSIS — L570 Actinic keratosis: Secondary | ICD-10-CM | POA: Diagnosis not present

## 2015-02-09 ENCOUNTER — Other Ambulatory Visit (HOSPITAL_COMMUNITY): Payer: Self-pay | Admitting: Internal Medicine

## 2015-02-09 DIAGNOSIS — C61 Malignant neoplasm of prostate: Secondary | ICD-10-CM

## 2015-02-22 ENCOUNTER — Ambulatory Visit (HOSPITAL_COMMUNITY)
Admission: RE | Admit: 2015-02-22 | Discharge: 2015-02-22 | Disposition: A | Discharge status: Home / Self Care | Payer: Medicare Other | Source: Ambulatory Visit | Attending: Internal Medicine | Admitting: Internal Medicine

## 2015-02-22 ENCOUNTER — Encounter (HOSPITAL_COMMUNITY)
Admission: RE | Admit: 2015-02-22 | Discharge: 2015-02-22 | Disposition: A | Discharge status: Home / Self Care | Payer: Medicare Other | Source: Ambulatory Visit | Attending: Internal Medicine | Admitting: Internal Medicine

## 2015-02-22 DIAGNOSIS — N62 Hypertrophy of breast: Secondary | ICD-10-CM | POA: Insufficient documentation

## 2015-02-22 DIAGNOSIS — R918 Other nonspecific abnormal finding of lung field: Secondary | ICD-10-CM | POA: Diagnosis not present

## 2015-02-22 DIAGNOSIS — I2510 Atherosclerotic heart disease of native coronary artery without angina pectoris: Secondary | ICD-10-CM | POA: Diagnosis not present

## 2015-02-22 DIAGNOSIS — C61 Malignant neoplasm of prostate: Secondary | ICD-10-CM | POA: Insufficient documentation

## 2015-02-22 DIAGNOSIS — K7689 Other specified diseases of liver: Secondary | ICD-10-CM | POA: Insufficient documentation

## 2015-02-22 DIAGNOSIS — I700 Atherosclerosis of aorta: Secondary | ICD-10-CM | POA: Diagnosis not present

## 2015-02-22 DIAGNOSIS — C7951 Secondary malignant neoplasm of bone: Secondary | ICD-10-CM | POA: Insufficient documentation

## 2015-02-22 MED ORDER — TECHNETIUM TC 99M MEDRONATE IV KIT
25.0000 | PACK | Freq: Once | INTRAVENOUS | Status: AC | PRN
  Administered 2015-02-22: 25 via INTRAVENOUS

## 2015-02-22 MED ORDER — IOHEXOL 300 MG/ML  SOLN
75.0000 mL | Freq: Once | INTRAMUSCULAR | Status: AC | PRN
  Administered 2015-02-22: 75 mL via INTRAVENOUS

## 2015-03-10 DIAGNOSIS — C779 Secondary and unspecified malignant neoplasm of lymph node, unspecified: Secondary | ICD-10-CM | POA: Diagnosis not present

## 2015-03-10 DIAGNOSIS — C7951 Secondary malignant neoplasm of bone: Secondary | ICD-10-CM | POA: Diagnosis not present

## 2015-03-10 DIAGNOSIS — Z8546 Personal history of malignant neoplasm of prostate: Secondary | ICD-10-CM | POA: Diagnosis not present

## 2015-03-10 DIAGNOSIS — Z85828 Personal history of other malignant neoplasm of skin: Secondary | ICD-10-CM | POA: Diagnosis not present

## 2015-03-13 DIAGNOSIS — R5383 Other fatigue: Secondary | ICD-10-CM | POA: Diagnosis not present

## 2015-03-13 DIAGNOSIS — Z79818 Long term (current) use of other agents affecting estrogen receptors and estrogen levels: Secondary | ICD-10-CM | POA: Diagnosis not present

## 2015-03-13 DIAGNOSIS — Z8546 Personal history of malignant neoplasm of prostate: Secondary | ICD-10-CM | POA: Diagnosis not present

## 2015-03-13 DIAGNOSIS — R351 Nocturia: Secondary | ICD-10-CM | POA: Diagnosis not present

## 2015-03-13 DIAGNOSIS — C61 Malignant neoplasm of prostate: Secondary | ICD-10-CM | POA: Diagnosis not present

## 2015-03-13 DIAGNOSIS — N529 Male erectile dysfunction, unspecified: Secondary | ICD-10-CM | POA: Diagnosis not present

## 2015-03-13 DIAGNOSIS — I4891 Unspecified atrial fibrillation: Secondary | ICD-10-CM | POA: Diagnosis not present

## 2015-03-13 DIAGNOSIS — Z85828 Personal history of other malignant neoplasm of skin: Secondary | ICD-10-CM | POA: Diagnosis not present

## 2015-03-13 DIAGNOSIS — M545 Low back pain: Secondary | ICD-10-CM | POA: Diagnosis not present

## 2015-03-13 DIAGNOSIS — Z789 Other specified health status: Secondary | ICD-10-CM | POA: Diagnosis not present

## 2015-03-13 DIAGNOSIS — R972 Elevated prostate specific antigen [PSA]: Secondary | ICD-10-CM | POA: Diagnosis not present

## 2015-03-13 DIAGNOSIS — C779 Secondary and unspecified malignant neoplasm of lymph node, unspecified: Secondary | ICD-10-CM | POA: Diagnosis not present

## 2015-03-13 DIAGNOSIS — C7951 Secondary malignant neoplasm of bone: Secondary | ICD-10-CM | POA: Diagnosis not present

## 2015-03-13 DIAGNOSIS — Z923 Personal history of irradiation: Secondary | ICD-10-CM | POA: Diagnosis not present

## 2015-03-16 ENCOUNTER — Other Ambulatory Visit: Payer: Self-pay | Admitting: Interventional Cardiology

## 2015-04-28 DIAGNOSIS — D485 Neoplasm of uncertain behavior of skin: Secondary | ICD-10-CM | POA: Diagnosis not present

## 2015-04-28 DIAGNOSIS — L570 Actinic keratosis: Secondary | ICD-10-CM | POA: Diagnosis not present

## 2015-04-28 DIAGNOSIS — Z85828 Personal history of other malignant neoplasm of skin: Secondary | ICD-10-CM | POA: Diagnosis not present

## 2015-05-02 DIAGNOSIS — D485 Neoplasm of uncertain behavior of skin: Secondary | ICD-10-CM | POA: Diagnosis not present

## 2015-05-02 DIAGNOSIS — D044 Carcinoma in situ of skin of scalp and neck: Secondary | ICD-10-CM | POA: Diagnosis not present

## 2015-05-02 DIAGNOSIS — L859 Epidermal thickening, unspecified: Secondary | ICD-10-CM | POA: Diagnosis not present

## 2015-05-02 DIAGNOSIS — Z85828 Personal history of other malignant neoplasm of skin: Secondary | ICD-10-CM | POA: Diagnosis not present

## 2015-05-24 ENCOUNTER — Other Ambulatory Visit (HOSPITAL_COMMUNITY): Payer: Self-pay | Admitting: Internal Medicine

## 2015-05-24 DIAGNOSIS — C61 Malignant neoplasm of prostate: Secondary | ICD-10-CM

## 2015-05-25 ENCOUNTER — Other Ambulatory Visit (HOSPITAL_COMMUNITY): Payer: Self-pay | Admitting: Internal Medicine

## 2015-05-25 DIAGNOSIS — C61 Malignant neoplasm of prostate: Secondary | ICD-10-CM

## 2015-06-13 ENCOUNTER — Ambulatory Visit (HOSPITAL_COMMUNITY)
Admission: RE | Admit: 2015-06-13 | Discharge: 2015-06-13 | Disposition: A | Discharge status: Home / Self Care | Payer: Medicare Other | Source: Ambulatory Visit | Attending: Internal Medicine | Admitting: Internal Medicine

## 2015-06-13 ENCOUNTER — Ambulatory Visit (HOSPITAL_COMMUNITY): Payer: Medicare Other

## 2015-06-13 ENCOUNTER — Other Ambulatory Visit (HOSPITAL_COMMUNITY): Payer: Medicare Other

## 2015-06-13 DIAGNOSIS — C61 Malignant neoplasm of prostate: Secondary | ICD-10-CM

## 2015-06-13 DIAGNOSIS — C7951 Secondary malignant neoplasm of bone: Secondary | ICD-10-CM | POA: Insufficient documentation

## 2015-06-13 DIAGNOSIS — I4891 Unspecified atrial fibrillation: Secondary | ICD-10-CM | POA: Diagnosis not present

## 2015-06-13 MED ORDER — IOPAMIDOL (ISOVUE-300) INJECTION 61%
INTRAVENOUS | Status: AC
  Administered 2015-06-13: 100 mL via INTRAVENOUS
  Filled 2015-06-13: qty 100

## 2015-06-13 MED ORDER — TECHNETIUM TC 99M MEDRONATE IV KIT
25.8000 | PACK | Freq: Once | INTRAVENOUS | Status: AC | PRN
  Administered 2015-06-13: 25.8 via INTRAVENOUS

## 2015-06-20 DIAGNOSIS — Z85828 Personal history of other malignant neoplasm of skin: Secondary | ICD-10-CM | POA: Diagnosis not present

## 2015-06-20 DIAGNOSIS — C779 Secondary and unspecified malignant neoplasm of lymph node, unspecified: Secondary | ICD-10-CM | POA: Diagnosis not present

## 2015-06-20 DIAGNOSIS — C7951 Secondary malignant neoplasm of bone: Secondary | ICD-10-CM | POA: Diagnosis not present

## 2015-06-20 DIAGNOSIS — Z8546 Personal history of malignant neoplasm of prostate: Secondary | ICD-10-CM | POA: Diagnosis not present

## 2015-06-21 DIAGNOSIS — R5383 Other fatigue: Secondary | ICD-10-CM | POA: Diagnosis not present

## 2015-06-21 DIAGNOSIS — I4891 Unspecified atrial fibrillation: Secondary | ICD-10-CM | POA: Diagnosis not present

## 2015-06-21 DIAGNOSIS — R351 Nocturia: Secondary | ICD-10-CM | POA: Diagnosis not present

## 2015-06-21 DIAGNOSIS — M545 Low back pain: Secondary | ICD-10-CM | POA: Diagnosis not present

## 2015-06-21 DIAGNOSIS — C7951 Secondary malignant neoplasm of bone: Secondary | ICD-10-CM | POA: Diagnosis not present

## 2015-06-21 DIAGNOSIS — Z79818 Long term (current) use of other agents affecting estrogen receptors and estrogen levels: Secondary | ICD-10-CM | POA: Diagnosis not present

## 2015-06-21 DIAGNOSIS — R972 Elevated prostate specific antigen [PSA]: Secondary | ICD-10-CM | POA: Diagnosis not present

## 2015-06-21 DIAGNOSIS — N529 Male erectile dysfunction, unspecified: Secondary | ICD-10-CM | POA: Diagnosis not present

## 2015-06-21 DIAGNOSIS — Z789 Other specified health status: Secondary | ICD-10-CM | POA: Diagnosis not present

## 2015-06-21 DIAGNOSIS — Z85828 Personal history of other malignant neoplasm of skin: Secondary | ICD-10-CM | POA: Diagnosis not present

## 2015-06-21 DIAGNOSIS — Z923 Personal history of irradiation: Secondary | ICD-10-CM | POA: Diagnosis not present

## 2015-06-21 DIAGNOSIS — C61 Malignant neoplasm of prostate: Secondary | ICD-10-CM | POA: Diagnosis not present

## 2015-06-21 DIAGNOSIS — Z8546 Personal history of malignant neoplasm of prostate: Secondary | ICD-10-CM | POA: Diagnosis not present

## 2015-06-21 DIAGNOSIS — C779 Secondary and unspecified malignant neoplasm of lymph node, unspecified: Secondary | ICD-10-CM | POA: Diagnosis not present

## 2015-07-06 DIAGNOSIS — L858 Other specified epidermal thickening: Secondary | ICD-10-CM | POA: Diagnosis not present

## 2015-07-06 DIAGNOSIS — L570 Actinic keratosis: Secondary | ICD-10-CM | POA: Diagnosis not present

## 2015-07-06 DIAGNOSIS — Z85828 Personal history of other malignant neoplasm of skin: Secondary | ICD-10-CM | POA: Diagnosis not present

## 2015-07-06 DIAGNOSIS — L929 Granulomatous disorder of the skin and subcutaneous tissue, unspecified: Secondary | ICD-10-CM | POA: Diagnosis not present

## 2015-07-06 DIAGNOSIS — D485 Neoplasm of uncertain behavior of skin: Secondary | ICD-10-CM | POA: Diagnosis not present

## 2015-07-17 ENCOUNTER — Other Ambulatory Visit: Payer: Self-pay | Admitting: Interventional Cardiology

## 2015-07-20 ENCOUNTER — Ambulatory Visit (INDEPENDENT_AMBULATORY_CARE_PROVIDER_SITE_OTHER): Payer: Medicare Other | Admitting: Podiatry

## 2015-07-20 ENCOUNTER — Encounter: Payer: Self-pay | Admitting: Podiatry

## 2015-07-20 VITALS — BP 138/88 | HR 69 | Resp 12

## 2015-07-20 DIAGNOSIS — M79674 Pain in right toe(s): Secondary | ICD-10-CM

## 2015-07-20 DIAGNOSIS — M79675 Pain in left toe(s): Secondary | ICD-10-CM | POA: Diagnosis not present

## 2015-07-20 DIAGNOSIS — M79605 Pain in left leg: Secondary | ICD-10-CM

## 2015-07-20 DIAGNOSIS — B351 Tinea unguium: Secondary | ICD-10-CM

## 2015-07-20 DIAGNOSIS — M79604 Pain in right leg: Secondary | ICD-10-CM

## 2015-07-21 NOTE — Progress Notes (Signed)
Subjective:     Patient ID: Jacob Becker, male   DOB: [birth year], 82 y.o.   MRN: [id number]  HPI patient's found to have pain in the nailbeds 1-5 both feet with thick yellow brittle debris   Review of Systems     Objective:   Physical Exam Neurovascular status unchanged with thick yellow brittle nailbeds 1-5 both feet    Assessment:     Mycotic nail infection with pain 1-5 both feet    Plan:     Debride nailbeds 1-5 both feet with no iatrogenic bleeding noted

## 2015-07-25 ENCOUNTER — Other Ambulatory Visit: Payer: Self-pay | Admitting: *Deleted

## 2015-07-25 MED ORDER — TRIAMTERENE-HCTZ 37.5-25 MG PO CAPS: 2.0000 | ORAL_CAPSULE | Freq: Every day | ORAL | Status: DC

## 2015-08-11 DIAGNOSIS — H5213 Myopia, bilateral: Secondary | ICD-10-CM | POA: Diagnosis not present

## 2015-08-11 DIAGNOSIS — H43813 Vitreous degeneration, bilateral: Secondary | ICD-10-CM | POA: Diagnosis not present

## 2015-08-11 DIAGNOSIS — H35361 Drusen (degenerative) of macula, right eye: Secondary | ICD-10-CM | POA: Diagnosis not present

## 2015-08-11 DIAGNOSIS — Z961 Presence of intraocular lens: Secondary | ICD-10-CM | POA: Diagnosis not present

## 2015-08-14 ENCOUNTER — Other Ambulatory Visit: Payer: Self-pay | Admitting: Interventional Cardiology

## 2015-08-22 DIAGNOSIS — L858 Other specified epidermal thickening: Secondary | ICD-10-CM | POA: Diagnosis not present

## 2015-08-22 DIAGNOSIS — L570 Actinic keratosis: Secondary | ICD-10-CM | POA: Diagnosis not present

## 2015-08-22 DIAGNOSIS — Z85828 Personal history of other malignant neoplasm of skin: Secondary | ICD-10-CM | POA: Diagnosis not present

## 2015-08-28 ENCOUNTER — Other Ambulatory Visit: Payer: Self-pay | Admitting: Interventional Cardiology

## 2015-08-28 NOTE — Telephone Encounter (Signed)
metoprolol succinate (TOPROL-XL) 100 MG 24 hr tablet  Medication  Date: 08/15/2015 Department: [facility] Office Ordering/Authorizing: Belva Crome, MD  Order Providers   Prescribing Provider Encounter Provider  Belva Crome, MD Belva Crome, MD  Medication Detail    Disp Refills Start End   metoprolol succinate (TOPROL-XL) 100 MG 24 hr tablet 30 tablet 0 08/15/2015    Sig: TAKE ONE TABLET TWICE DAILY   Notes to Pharmacy: Please call our office to schedule an yearly appointment before anymore refills. [phone]. Thank you 2nd attempt   E-Prescribing Status: Receipt confirmed by pharmacy (08/15/2015 11:13 AM EDT)   Pharmacy   [facility], [place] - [address]

## 2015-08-29 ENCOUNTER — Other Ambulatory Visit: Payer: Self-pay | Admitting: Interventional Cardiology

## 2015-08-29 ENCOUNTER — Telehealth: Payer: Self-pay | Admitting: Interventional Cardiology

## 2015-08-29 MED ORDER — METOPROLOL SUCCINATE ER 100 MG PO TB24: 100.0000 mg | ORAL_TABLET | Freq: Two times a day (BID) | ORAL | 2 refills | Status: DC

## 2015-08-29 NOTE — Telephone Encounter (Signed)
New message    Pt wife is on the phone stating that the pt has an appt the week of 09/11/15. She is adamant the her husband scheduled an appt b/c it is on his calendar. She refuses to see a PA. Can you work her in that week? Please call.

## 2015-08-29 NOTE — Telephone Encounter (Signed)
Got refill request for Metoprolol from [facility] stating patient made appointment. I saw no appt made and called pharmacy and spoke with pharmacist and explained pt needs appt before refill for 30 day supply can be made. Patient was approved for 15 day supply 08/28/2015 so he is not out of medication right now. Pharmacy will let pt know to call and schedule follow up appointment. Gave phone number to make sure they had correct number.

## 2015-08-29 NOTE — Telephone Encounter (Signed)
Error

## 2015-09-01 NOTE — Telephone Encounter (Signed)
Spoke with pt's wife. Pt is requesting an earlier appt with Dr.Smith. appt scheduled for 9/8 @ 9am. Pt's wife voiced appreciation for the call back.

## 2015-09-01 NOTE — Telephone Encounter (Signed)
Returned pt's wife call. lmtcb if assistance is still needed.

## 2015-09-11 ENCOUNTER — Telehealth: Payer: Self-pay | Admitting: Interventional Cardiology

## 2015-09-11 NOTE — Telephone Encounter (Signed)
New message        What dental office are you calling from? Dr Essie Hart 1. What is your office phone and fax number? Fax [phone]  2. What type of procedure is the patient having performed? extraction  3. What date is procedure scheduled? 09-27-15  4. What is your question (ex. Antibiotics prior to procedure, holding medication-we need to know how long dentist wants pt to hold med)? How many days prior to hold xarelto and when to restart medication

## 2015-09-11 NOTE — Telephone Encounter (Signed)
Clearance request forwarded to Dr.Smith to advise

## 2015-09-12 NOTE — Telephone Encounter (Signed)
To Whom It May Concern,  Mr. Jacob Becker is cleared for the upcoming dental extraction. Xarelto should be held for at least 48 hours prior to extraction. The medication can be resumed 24 hours after the procedure. Antibiotics are not required for his particular cardiac problem.

## 2015-09-13 NOTE — Telephone Encounter (Signed)
Cardiac clearance placed in MR nurse fax box to be faxed

## 2015-09-22 DIAGNOSIS — C7951 Secondary malignant neoplasm of bone: Secondary | ICD-10-CM | POA: Diagnosis not present

## 2015-09-22 DIAGNOSIS — C779 Secondary and unspecified malignant neoplasm of lymph node, unspecified: Secondary | ICD-10-CM | POA: Diagnosis not present

## 2015-09-22 DIAGNOSIS — Z85828 Personal history of other malignant neoplasm of skin: Secondary | ICD-10-CM | POA: Diagnosis not present

## 2015-09-22 DIAGNOSIS — Z8546 Personal history of malignant neoplasm of prostate: Secondary | ICD-10-CM | POA: Diagnosis not present

## 2015-09-25 DIAGNOSIS — Z85828 Personal history of other malignant neoplasm of skin: Secondary | ICD-10-CM | POA: Diagnosis not present

## 2015-09-25 DIAGNOSIS — C61 Malignant neoplasm of prostate: Secondary | ICD-10-CM | POA: Diagnosis not present

## 2015-09-25 DIAGNOSIS — Z8546 Personal history of malignant neoplasm of prostate: Secondary | ICD-10-CM | POA: Diagnosis not present

## 2015-09-25 DIAGNOSIS — C7951 Secondary malignant neoplasm of bone: Secondary | ICD-10-CM | POA: Diagnosis not present

## 2015-09-25 DIAGNOSIS — C779 Secondary and unspecified malignant neoplasm of lymph node, unspecified: Secondary | ICD-10-CM | POA: Diagnosis not present

## 2015-10-06 ENCOUNTER — Ambulatory Visit: Payer: Medicare Other | Admitting: Interventional Cardiology

## 2015-10-09 ENCOUNTER — Other Ambulatory Visit (HOSPITAL_COMMUNITY): Payer: Self-pay | Admitting: Internal Medicine

## 2015-10-09 DIAGNOSIS — C61 Malignant neoplasm of prostate: Secondary | ICD-10-CM

## 2015-10-10 ENCOUNTER — Other Ambulatory Visit: Payer: Self-pay | Admitting: *Deleted

## 2015-10-10 MED ORDER — RAMIPRIL 10 MG PO CAPS: 10.0000 mg | ORAL_CAPSULE | Freq: Two times a day (BID) | ORAL | 0 refills | Status: DC

## 2015-10-10 MED ORDER — TRIAMTERENE-HCTZ 37.5-25 MG PO CAPS: ORAL_CAPSULE | ORAL | 0 refills | Status: DC

## 2015-10-11 ENCOUNTER — Encounter: Payer: Self-pay | Admitting: Interventional Cardiology

## 2015-10-11 DIAGNOSIS — Z1389 Encounter for screening for other disorder: Secondary | ICD-10-CM | POA: Diagnosis not present

## 2015-10-11 DIAGNOSIS — Z79899 Other long term (current) drug therapy: Secondary | ICD-10-CM | POA: Diagnosis not present

## 2015-10-11 DIAGNOSIS — I482 Chronic atrial fibrillation: Secondary | ICD-10-CM | POA: Diagnosis not present

## 2015-10-11 DIAGNOSIS — Z23 Encounter for immunization: Secondary | ICD-10-CM | POA: Diagnosis not present

## 2015-10-11 DIAGNOSIS — C61 Malignant neoplasm of prostate: Secondary | ICD-10-CM | POA: Diagnosis not present

## 2015-10-11 DIAGNOSIS — D649 Anemia, unspecified: Secondary | ICD-10-CM | POA: Diagnosis not present

## 2015-10-11 DIAGNOSIS — I129 Hypertensive chronic kidney disease with stage 1 through stage 4 chronic kidney disease, or unspecified chronic kidney disease: Secondary | ICD-10-CM | POA: Diagnosis not present

## 2015-10-11 DIAGNOSIS — D696 Thrombocytopenia, unspecified: Secondary | ICD-10-CM | POA: Diagnosis not present

## 2015-10-11 DIAGNOSIS — E7800 Pure hypercholesterolemia, unspecified: Secondary | ICD-10-CM | POA: Diagnosis not present

## 2015-10-11 DIAGNOSIS — Z0000 Encounter for general adult medical examination without abnormal findings: Secondary | ICD-10-CM | POA: Diagnosis not present

## 2015-10-11 DIAGNOSIS — N183 Chronic kidney disease, stage 3 (moderate): Secondary | ICD-10-CM | POA: Diagnosis not present

## 2015-10-19 ENCOUNTER — Ambulatory Visit: Payer: Medicare Other | Admitting: Podiatry

## 2015-10-23 ENCOUNTER — Encounter (HOSPITAL_COMMUNITY)
Admission: RE | Admit: 2015-10-23 | Discharge: 2015-10-23 | Disposition: A | Discharge status: Home / Self Care | Payer: Medicare Other | Source: Ambulatory Visit | Attending: Internal Medicine | Admitting: Internal Medicine

## 2015-10-23 ENCOUNTER — Ambulatory Visit (HOSPITAL_COMMUNITY): Payer: Medicare Other

## 2015-10-23 ENCOUNTER — Ambulatory Visit (HOSPITAL_COMMUNITY)
Admission: RE | Admit: 2015-10-23 | Discharge: 2015-10-23 | Disposition: A | Discharge status: Home / Self Care | Payer: Medicare Other | Source: Ambulatory Visit | Attending: Internal Medicine | Admitting: Internal Medicine

## 2015-10-23 DIAGNOSIS — C7951 Secondary malignant neoplasm of bone: Secondary | ICD-10-CM | POA: Insufficient documentation

## 2015-10-23 DIAGNOSIS — C61 Malignant neoplasm of prostate: Secondary | ICD-10-CM

## 2015-10-23 MED ORDER — TECHNETIUM TC 99M MEDRONATE IV KIT
25.9000 | PACK | Freq: Once | INTRAVENOUS | Status: AC | PRN
  Administered 2015-10-23: 25.9 via INTRAVENOUS

## 2015-10-23 MED ORDER — IOPAMIDOL (ISOVUE-300) INJECTION 61%
INTRAVENOUS | Status: AC
  Administered 2015-10-23: 75 mL via INTRAVENOUS
  Filled 2015-10-23: qty 75

## 2015-10-27 ENCOUNTER — Ambulatory Visit (INDEPENDENT_AMBULATORY_CARE_PROVIDER_SITE_OTHER): Payer: Medicare Other | Admitting: Podiatry

## 2015-10-27 ENCOUNTER — Encounter: Payer: Self-pay | Admitting: Podiatry

## 2015-10-27 DIAGNOSIS — B351 Tinea unguium: Secondary | ICD-10-CM

## 2015-10-27 DIAGNOSIS — M79604 Pain in right leg: Secondary | ICD-10-CM

## 2015-10-27 DIAGNOSIS — M79605 Pain in left leg: Secondary | ICD-10-CM

## 2015-10-27 DIAGNOSIS — M79675 Pain in left toe(s): Secondary | ICD-10-CM

## 2015-10-27 DIAGNOSIS — M79674 Pain in right toe(s): Secondary | ICD-10-CM

## 2015-10-27 NOTE — Progress Notes (Signed)
Subjective:     Patient ID: Jacob Becker, male   DOB: 17-Aug-1929, 82 y.o.   MRN: [id number]  HPI patient presents stating he has thick incurvated nailbeds 1-5 both feet that are painful   Review of Systems     Objective:   Physical Exam Neurovascular unchanged mycotic nail infection with pain 1-5 both feet    Assessment:     Chronic nail disease with mycosis 1-5 both feet    Plan:    debris painful nailbeds 1-5 both feet with no iatrogenic bleeding noted

## 2015-11-16 ENCOUNTER — Ambulatory Visit: Payer: Medicare Other | Admitting: Interventional Cardiology

## 2015-11-27 ENCOUNTER — Other Ambulatory Visit: Payer: Self-pay | Admitting: Interventional Cardiology

## 2015-12-25 ENCOUNTER — Encounter: Payer: Self-pay | Admitting: Interventional Cardiology

## 2015-12-25 ENCOUNTER — Ambulatory Visit (INDEPENDENT_AMBULATORY_CARE_PROVIDER_SITE_OTHER): Payer: Medicare Other | Admitting: Interventional Cardiology

## 2015-12-25 VITALS — BP 144/86 | HR 81 | Ht 72.0 in | Wt 190.2 lb

## 2015-12-25 DIAGNOSIS — E784 Other hyperlipidemia: Secondary | ICD-10-CM

## 2015-12-25 DIAGNOSIS — I482 Chronic atrial fibrillation, unspecified: Secondary | ICD-10-CM

## 2015-12-25 DIAGNOSIS — I10 Essential (primary) hypertension: Secondary | ICD-10-CM | POA: Diagnosis not present

## 2015-12-25 DIAGNOSIS — E7849 Other hyperlipidemia: Secondary | ICD-10-CM

## 2015-12-25 DIAGNOSIS — I2510 Atherosclerotic heart disease of native coronary artery without angina pectoris: Secondary | ICD-10-CM | POA: Diagnosis not present

## 2015-12-25 DIAGNOSIS — Z7901 Long term (current) use of anticoagulants: Secondary | ICD-10-CM

## 2015-12-25 MED ORDER — NITROGLYCERIN 0.4 MG SL SUBL: SUBLINGUAL_TABLET | SUBLINGUAL | 3 refills | Status: DC

## 2015-12-25 NOTE — Patient Instructions (Signed)

## 2015-12-25 NOTE — Progress Notes (Signed)
Cardiology Office Note    Date:  12/25/2015   ID:  Jacob Becker, DOB 06/03/1929, MRN [id number]  PCP:  Mathews Argyle, MD  Cardiologist: Sinclair Grooms, MD   Chief Complaint  Patient presents with  . Coronary Artery Disease  . Atrial Fibrillation    History of Present Illness:  Jacob Becker is a 82 y.o. male who presents for essential hypertension, hyperlipidemia, coronary artery disease, chronic atrial fibrillation and chronic anticoagulation therapy  There are no cardiac complaints. In her is not able to walk as briskly as the ones could but still able to walk 2 flights of stairs without difficulty. He exercises regularly. He is still working about 8 hours each day. He denies angina. No medication side effects.    Past Medical History:  Diagnosis Date  . AF (atrial fibrillation) ([place])   . Arthritis   . Cancer [facility])    prostate  . Dysrhythmia    af  . Hyperlipemia   . Hypertension   . Metastatic bone cancer ([facility])    from prostate  . Wears glasses     Past Surgical History:  Procedure Laterality Date  . CHEILECTOMY Right 10/07/2013   Procedure: RIGHT HALLUX CHEILECTOMY;  Surgeon: [name], MD;  Location: [place];  Service: Orthopedics;  Laterality: Right;  . COLONOSCOPY    . CYSTOSCOPY PROSTATE W/ LASER  2012   turp  . HERNIA REPAIR     rt and lt ing  . KNEE ARTHROSCOPY  2005   right  . TONSILLECTOMY      Current Medications: Outpatient Medications Prior to Visit  Medication Sig Dispense Refill  . amLODipine (NORVASC) 5 MG tablet TAKE 1 BY MOUTH DAILY 90 tablet 2  . Calcium-Magnesium-Vitamin D (CALCIUM 500 PO) Take 500 mg by mouth 2 (two) times daily.    . Coenzyme Q10 (CO Q 10) 100 MG CAPS Take 100 mg by mouth daily.    . colesevelam (WELCHOL) 625 MG tablet Take 625 mg by mouth 2 (two) times daily with a meal.    . enzalutamide (XTANDI) 40 MG capsule Take 160 mg by mouth daily.    . metoprolol succinate (TOPROL-XL)  100 MG 24 hr tablet Take 1 tablet (100 mg total) by mouth 2 (two) times daily. Must keep follow up in November 60 tablet 1  . Multiple Vitamin (MULTIVITAMIN) capsule Take 1 capsule by mouth daily.    . Omega-3 Fatty Acids (FISH OIL PO) Take 1,000 mg by mouth daily.    . ramipril (ALTACE) 10 MG capsule Take 1 capsule (10 mg total) by mouth 2 (two) times daily. Please keep 12/25/15 appointment for further refills 180 capsule 0  . rosuvastatin (CRESTOR) 40 MG tablet Take 40 mg by mouth daily.    Marland Kitchen triamterene-hydrochlorothiazide (DYAZIDE) 37.5-25 MG capsule Take two capsules by mouth daily.Please keep 12/25/15 appointment for further refills 180 capsule 0  . XARELTO 20 MG TABS tablet TAKE ONE TABLET EACH DAY 30 tablet 6  . nitroGLYCERIN (NITROSTAT) 0.4 MG SL tablet ONE TABLET UNDER TONGUE EVERY 5 MINUTES AS NEEDED FOR CHEST PAIN 25 tablet 3  . traMADol (ULTRAM) 50 MG tablet Take 1 tablet (50 mg total) by mouth every 6 (six) hours as needed. 30 tablet 0   No facility-administered medications prior to visit.      Allergies:   Patient has no known allergies.   Social History   Social History  . Marital status: Married  Spouse name: N/A  . Number of children: N/A  . Years of education: N/A   Social History Main Topics  . Smoking status: Former Smoker    Quit date: 10/02/1971  . Smokeless tobacco: Never Used  . Alcohol use Yes     Comment: daily  . Drug use: No  . Sexual activity: Not on file   Other Topics Concern  . Not on file   Social History Narrative  . No narrative on file     Family History:  The patient's family history includes Heart failure in his father.   ROS:   Please see the history of present illness.    The prostate situation is being followed at [facility] and is under good control. Lipids have been under excellent control with Crestor. No side effects. All other systems reviewed and are negative.   PHYSICAL EXAM:   VS:  BP (!) 144/86 (BP Location: Left  Arm)   Pulse 81   Ht 6' (1.829 m)   Wt 190 lb 3.2 oz (86.3 kg)   BMI 25.80 kg/m    GEN: Well nourished, well developed, in no acute distress  HEENT: normal  Neck: no JVD, carotid bruits, or masses Cardiac: IIRR; no murmurs, rubs, or gallops,no edema  Respiratory:  clear to auscultation bilaterally, normal work of breathing GI: soft, nontender, nondistended, + BS MS: no deformity or atrophy  Skin: warm and dry, no rash Neuro:  Alert and Oriented x 3, Strength and sensation are intact Psych: euthymic mood, full affect  Wt Readings from Last 3 Encounters:  12/25/15 190 lb 3.2 oz (86.3 kg)  07/08/14 199 lb (90.3 kg)  10/07/13 199 lb 9.6 oz (90.5 kg)      Studies/Labs Reviewed:   EKG:  EKG  Atrial fibrillation with controlled rate at 81 bpm. Incomplete right bundle branch block.  Recent Labs: No results found for requested labs within last 8760 hours.   Lipid Panel    Component Value Date/Time   CHOL 162 09/03/2013 0749   TRIG 87.0 09/03/2013 0749   HDL 56.10 09/03/2013 0749   CHOLHDL 3 09/03/2013 0749   VLDL 17.4 09/03/2013 0749   LDLCALC 89 09/03/2013 0749    Additional studies/ records that were reviewed today include:  Data from [place] and primary care Dr. Lajean Manes. This included laboratory data most significant of which is a BUN and creatinine in the 30 and 1.3 range. A mild anemia with hemoglobin of 11.3    ASSESSMENT:    1. Chronic atrial fibrillation ([place])   2. Coronary artery disease involving native coronary artery of native heart without angina pectoris   3. Essential hypertension   4. Other hyperlipidemia   5. Chronic anticoagulation      PLAN:  In order of problems listed above:  1. Controlled rate on the current medical regimen. 2. Stable without angina pectoris 3. Blood pressure is well controlled, however due to note of the medication regimen which suggests that he is on triamterene HCTZ 37.5 mg 2 tablets daily. His BUN and  creatinine are slightly increased. We will likely decrease this to one tablet per day but he will verify with his wife that he is actually using 2 tablets daily. If we decreased the dose he will need to come back for blood pressure follow-up to make sure we are not allowing the pressure to be uncontrolled. 4. Excellent lipid panel recently performed by Dr. Felipa Eth, with LDL cholesterol around 90. 5. No bleeding on Xarelto.  Medication Adjustments/Labs and Tests Ordered: Current medicines are reviewed at length with the patient today.  Concerns regarding medicines are outlined above.  Medication changes, Labs and Tests ordered today are listed in the Patient Instructions below. Patient Instructions  Medication Instructions:  None  Labwork: None  Testing/Procedures: None  Follow-Up: Your physician wants you to follow-up in: 1 year with Dr. Tamala Julian. You will receive a reminder letter in the mail two months in advance. If you don't receive a letter, please call our office to schedule the follow-up appointment.   Any Other Special Instructions Will Be Listed Below (If Applicable).     If you need a refill on your cardiac medications before your next appointment, please call your pharmacy.      Signed, Sinclair Grooms, MD  12/25/2015 2:59 PM    [facility] [address], [place], [place]  [address] Phone: [phone]; Fax: [phone]

## 2015-12-29 DIAGNOSIS — C779 Secondary and unspecified malignant neoplasm of lymph node, unspecified: Secondary | ICD-10-CM | POA: Diagnosis not present

## 2015-12-29 DIAGNOSIS — Z8546 Personal history of malignant neoplasm of prostate: Secondary | ICD-10-CM | POA: Diagnosis not present

## 2015-12-29 DIAGNOSIS — C7951 Secondary malignant neoplasm of bone: Secondary | ICD-10-CM | POA: Diagnosis not present

## 2015-12-29 DIAGNOSIS — Z85828 Personal history of other malignant neoplasm of skin: Secondary | ICD-10-CM | POA: Diagnosis not present

## 2016-01-01 DIAGNOSIS — N529 Male erectile dysfunction, unspecified: Secondary | ICD-10-CM | POA: Diagnosis not present

## 2016-01-01 DIAGNOSIS — R972 Elevated prostate specific antigen [PSA]: Secondary | ICD-10-CM | POA: Diagnosis not present

## 2016-01-01 DIAGNOSIS — C61 Malignant neoplasm of prostate: Secondary | ICD-10-CM | POA: Diagnosis not present

## 2016-01-01 DIAGNOSIS — Z79818 Long term (current) use of other agents affecting estrogen receptors and estrogen levels: Secondary | ICD-10-CM | POA: Diagnosis not present

## 2016-01-01 DIAGNOSIS — Z789 Other specified health status: Secondary | ICD-10-CM | POA: Diagnosis not present

## 2016-01-01 DIAGNOSIS — R351 Nocturia: Secondary | ICD-10-CM | POA: Diagnosis not present

## 2016-01-01 DIAGNOSIS — M545 Low back pain: Secondary | ICD-10-CM | POA: Diagnosis not present

## 2016-01-01 DIAGNOSIS — Z85828 Personal history of other malignant neoplasm of skin: Secondary | ICD-10-CM | POA: Diagnosis not present

## 2016-01-01 DIAGNOSIS — I4891 Unspecified atrial fibrillation: Secondary | ICD-10-CM | POA: Diagnosis not present

## 2016-01-01 DIAGNOSIS — R5383 Other fatigue: Secondary | ICD-10-CM | POA: Diagnosis not present

## 2016-01-01 DIAGNOSIS — Z923 Personal history of irradiation: Secondary | ICD-10-CM | POA: Diagnosis not present

## 2016-01-01 DIAGNOSIS — C779 Secondary and unspecified malignant neoplasm of lymph node, unspecified: Secondary | ICD-10-CM | POA: Diagnosis not present

## 2016-01-01 DIAGNOSIS — Z8546 Personal history of malignant neoplasm of prostate: Secondary | ICD-10-CM | POA: Diagnosis not present

## 2016-01-01 DIAGNOSIS — C7951 Secondary malignant neoplasm of bone: Secondary | ICD-10-CM | POA: Diagnosis not present

## 2016-01-10 DIAGNOSIS — D649 Anemia, unspecified: Secondary | ICD-10-CM | POA: Diagnosis not present

## 2016-01-23 ENCOUNTER — Other Ambulatory Visit: Payer: Self-pay

## 2016-01-23 MED ORDER — TRIAMTERENE-HCTZ 37.5-25 MG PO CAPS: ORAL_CAPSULE | ORAL | 0 refills | Status: DC

## 2016-01-24 ENCOUNTER — Telehealth: Payer: Self-pay | Admitting: *Deleted

## 2016-01-24 ENCOUNTER — Other Ambulatory Visit: Payer: Self-pay | Admitting: *Deleted

## 2016-01-24 MED ORDER — RAMIPRIL 10 MG PO CAPS: 10.0000 mg | ORAL_CAPSULE | Freq: Two times a day (BID) | ORAL | 3 refills | Status: DC

## 2016-01-24 MED ORDER — AMLODIPINE BESYLATE 5 MG PO TABS: 5.0000 mg | ORAL_TABLET | Freq: Every day | ORAL | 3 refills | Status: DC

## 2016-01-24 MED ORDER — TRIAMTERENE-HCTZ 37.5-25 MG PO CAPS: ORAL_CAPSULE | ORAL | 3 refills | Status: DC

## 2016-01-24 NOTE — Telephone Encounter (Signed)
SWITCHED TO MAIL SERVICES PER PT REQUEST.

## 2016-01-24 NOTE — Telephone Encounter (Signed)
CALLED AND SPOKE WITH BOB, HE WILL FILL 14 DAY SUPPLY FOR PT UNTIL MAIL SERVICE COMES IN. PT AWARE.

## 2016-01-30 ENCOUNTER — Other Ambulatory Visit: Payer: Self-pay | Admitting: Interventional Cardiology

## 2016-02-09 ENCOUNTER — Ambulatory Visit: Payer: Medicare Other

## 2016-02-12 ENCOUNTER — Ambulatory Visit (INDEPENDENT_AMBULATORY_CARE_PROVIDER_SITE_OTHER): Payer: Medicare Other | Admitting: Podiatry

## 2016-02-12 ENCOUNTER — Encounter: Payer: Self-pay | Admitting: Podiatry

## 2016-02-12 DIAGNOSIS — M79605 Pain in left leg: Secondary | ICD-10-CM

## 2016-02-12 DIAGNOSIS — M79604 Pain in right leg: Secondary | ICD-10-CM

## 2016-02-12 DIAGNOSIS — B351 Tinea unguium: Secondary | ICD-10-CM | POA: Diagnosis not present

## 2016-02-18 NOTE — Progress Notes (Signed)
Subjective:     Patient ID: Jacob Becker, male   DOB: 15-Apr-1929, 83 y.o.   MRN: [id number]  HPI patient presents with nail disease 1-5 both feet that are thick yellow brittle and he cannot cut and lesion distal third right that's painful   Review of Systems     Objective:   Physical Exam Neurovascular status intact with thick yellow brittle nailbeds 1-5 both feet that are painful and lesion distal third that's painful and he cannot take care of    Assessment:     Mycotic nail infection with pain and distal lesion right    Plan:     Debride lesions right foot and debridement nailbeds 1-5 both feet with no iatrogenic bleeding noted

## 2016-03-12 DIAGNOSIS — L570 Actinic keratosis: Secondary | ICD-10-CM | POA: Diagnosis not present

## 2016-03-12 DIAGNOSIS — Z85828 Personal history of other malignant neoplasm of skin: Secondary | ICD-10-CM | POA: Diagnosis not present

## 2016-03-12 DIAGNOSIS — D485 Neoplasm of uncertain behavior of skin: Secondary | ICD-10-CM | POA: Diagnosis not present

## 2016-03-13 ENCOUNTER — Other Ambulatory Visit (HOSPITAL_COMMUNITY): Payer: Self-pay | Admitting: Internal Medicine

## 2016-03-13 DIAGNOSIS — C61 Malignant neoplasm of prostate: Secondary | ICD-10-CM

## 2016-04-04 ENCOUNTER — Encounter (HOSPITAL_COMMUNITY): Payer: Medicare Other

## 2016-04-04 ENCOUNTER — Ambulatory Visit (HOSPITAL_COMMUNITY): Payer: Medicare Other

## 2016-04-12 ENCOUNTER — Encounter (HOSPITAL_COMMUNITY)
Admission: RE | Admit: 2016-04-12 | Discharge: 2016-04-12 | Disposition: A | Discharge status: Home / Self Care | Payer: Medicare Other | Source: Ambulatory Visit | Attending: Internal Medicine | Admitting: Internal Medicine

## 2016-04-12 DIAGNOSIS — R911 Solitary pulmonary nodule: Secondary | ICD-10-CM | POA: Diagnosis not present

## 2016-04-12 DIAGNOSIS — C7951 Secondary malignant neoplasm of bone: Secondary | ICD-10-CM | POA: Insufficient documentation

## 2016-04-12 DIAGNOSIS — C61 Malignant neoplasm of prostate: Secondary | ICD-10-CM

## 2016-04-12 MED ORDER — TECHNETIUM TC 99M MEDRONATE IV KIT
25.0000 | PACK | Freq: Once | INTRAVENOUS | Status: AC | PRN
  Administered 2016-04-12: 25 via INTRAVENOUS

## 2016-04-12 MED ORDER — IOPAMIDOL (ISOVUE-300) INJECTION 61%
INTRAVENOUS | Status: AC
  Administered 2016-04-12: 75 mL via INTRAVENOUS
  Filled 2016-04-12: qty 75

## 2016-04-16 DIAGNOSIS — L988 Other specified disorders of the skin and subcutaneous tissue: Secondary | ICD-10-CM | POA: Diagnosis not present

## 2016-04-16 DIAGNOSIS — Z85828 Personal history of other malignant neoplasm of skin: Secondary | ICD-10-CM | POA: Diagnosis not present

## 2016-04-16 DIAGNOSIS — L570 Actinic keratosis: Secondary | ICD-10-CM | POA: Diagnosis not present

## 2016-04-16 DIAGNOSIS — C44529 Squamous cell carcinoma of skin of other part of trunk: Secondary | ICD-10-CM | POA: Diagnosis not present

## 2016-04-16 DIAGNOSIS — D485 Neoplasm of uncertain behavior of skin: Secondary | ICD-10-CM | POA: Diagnosis not present

## 2016-04-18 DIAGNOSIS — Z85828 Personal history of other malignant neoplasm of skin: Secondary | ICD-10-CM | POA: Diagnosis not present

## 2016-04-19 DIAGNOSIS — C7951 Secondary malignant neoplasm of bone: Secondary | ICD-10-CM | POA: Diagnosis not present

## 2016-04-19 DIAGNOSIS — C779 Secondary and unspecified malignant neoplasm of lymph node, unspecified: Secondary | ICD-10-CM | POA: Diagnosis not present

## 2016-04-19 DIAGNOSIS — C61 Malignant neoplasm of prostate: Secondary | ICD-10-CM | POA: Diagnosis not present

## 2016-04-19 DIAGNOSIS — Z8546 Personal history of malignant neoplasm of prostate: Secondary | ICD-10-CM | POA: Diagnosis not present

## 2016-04-19 DIAGNOSIS — I4891 Unspecified atrial fibrillation: Secondary | ICD-10-CM | POA: Diagnosis not present

## 2016-04-19 DIAGNOSIS — M5430 Sciatica, unspecified side: Secondary | ICD-10-CM | POA: Diagnosis not present

## 2016-04-19 DIAGNOSIS — R972 Elevated prostate specific antigen [PSA]: Secondary | ICD-10-CM | POA: Diagnosis not present

## 2016-04-19 DIAGNOSIS — Z85828 Personal history of other malignant neoplasm of skin: Secondary | ICD-10-CM | POA: Diagnosis not present

## 2016-04-19 DIAGNOSIS — Z936 Other artificial openings of urinary tract status: Secondary | ICD-10-CM | POA: Diagnosis not present

## 2016-04-19 DIAGNOSIS — Z923 Personal history of irradiation: Secondary | ICD-10-CM | POA: Diagnosis not present

## 2016-04-19 DIAGNOSIS — R5383 Other fatigue: Secondary | ICD-10-CM | POA: Diagnosis not present

## 2016-04-22 DIAGNOSIS — M5430 Sciatica, unspecified side: Secondary | ICD-10-CM | POA: Diagnosis not present

## 2016-04-22 DIAGNOSIS — C7951 Secondary malignant neoplasm of bone: Secondary | ICD-10-CM | POA: Diagnosis not present

## 2016-04-22 DIAGNOSIS — M545 Low back pain: Secondary | ICD-10-CM | POA: Diagnosis not present

## 2016-04-22 DIAGNOSIS — R351 Nocturia: Secondary | ICD-10-CM | POA: Diagnosis not present

## 2016-04-22 DIAGNOSIS — N529 Male erectile dysfunction, unspecified: Secondary | ICD-10-CM | POA: Diagnosis not present

## 2016-04-22 DIAGNOSIS — Z789 Other specified health status: Secondary | ICD-10-CM | POA: Diagnosis not present

## 2016-04-22 DIAGNOSIS — Z923 Personal history of irradiation: Secondary | ICD-10-CM | POA: Diagnosis not present

## 2016-04-22 DIAGNOSIS — Z85828 Personal history of other malignant neoplasm of skin: Secondary | ICD-10-CM | POA: Diagnosis not present

## 2016-04-22 DIAGNOSIS — Z79818 Long term (current) use of other agents affecting estrogen receptors and estrogen levels: Secondary | ICD-10-CM | POA: Diagnosis not present

## 2016-04-22 DIAGNOSIS — R972 Elevated prostate specific antigen [PSA]: Secondary | ICD-10-CM | POA: Diagnosis not present

## 2016-04-22 DIAGNOSIS — C779 Secondary and unspecified malignant neoplasm of lymph node, unspecified: Secondary | ICD-10-CM | POA: Diagnosis not present

## 2016-04-22 DIAGNOSIS — R5383 Other fatigue: Secondary | ICD-10-CM | POA: Diagnosis not present

## 2016-04-22 DIAGNOSIS — Z8546 Personal history of malignant neoplasm of prostate: Secondary | ICD-10-CM | POA: Diagnosis not present

## 2016-04-22 DIAGNOSIS — I4891 Unspecified atrial fibrillation: Secondary | ICD-10-CM | POA: Diagnosis not present

## 2016-04-22 DIAGNOSIS — C61 Malignant neoplasm of prostate: Secondary | ICD-10-CM | POA: Diagnosis not present

## 2016-05-15 ENCOUNTER — Ambulatory Visit: Payer: Medicare Other | Admitting: Podiatry

## 2016-06-05 ENCOUNTER — Ambulatory Visit: Payer: Medicare Other | Admitting: Podiatry

## 2016-06-19 ENCOUNTER — Ambulatory Visit (INDEPENDENT_AMBULATORY_CARE_PROVIDER_SITE_OTHER): Payer: Medicare Other | Admitting: Podiatry

## 2016-06-19 ENCOUNTER — Encounter: Payer: Self-pay | Admitting: Podiatry

## 2016-06-19 DIAGNOSIS — B351 Tinea unguium: Secondary | ICD-10-CM | POA: Diagnosis not present

## 2016-06-19 DIAGNOSIS — M79604 Pain in right leg: Secondary | ICD-10-CM

## 2016-06-19 DIAGNOSIS — M79605 Pain in left leg: Secondary | ICD-10-CM

## 2016-06-19 DIAGNOSIS — M79676 Pain in unspecified toe(s): Secondary | ICD-10-CM

## 2016-06-21 NOTE — Progress Notes (Signed)
Subjective:    Patient ID: Jacob Becker, male   DOB: 83 y.o.   MRN: [id number]   HPI patient presents stating my nails have been bothering me and are getting thick and I cannot cut them and they're sore with shoes    ROS      Objective:  Physical Exam Neurovascular status intact with thick yellow brittle nailbeds 1-5 both feet with pain    Assessment:    Mycotic nail infection with pain 1-5 both feet     Plan:     Debridement of painful nailbeds, no iatrogenic bleeding noted

## 2016-07-24 ENCOUNTER — Other Ambulatory Visit (HOSPITAL_COMMUNITY): Payer: Self-pay | Admitting: Internal Medicine

## 2016-07-24 DIAGNOSIS — C61 Malignant neoplasm of prostate: Secondary | ICD-10-CM

## 2016-08-06 DIAGNOSIS — L570 Actinic keratosis: Secondary | ICD-10-CM | POA: Diagnosis not present

## 2016-08-06 DIAGNOSIS — D1801 Hemangioma of skin and subcutaneous tissue: Secondary | ICD-10-CM | POA: Diagnosis not present

## 2016-08-06 DIAGNOSIS — Z85828 Personal history of other malignant neoplasm of skin: Secondary | ICD-10-CM | POA: Diagnosis not present

## 2016-08-07 ENCOUNTER — Ambulatory Visit (HOSPITAL_COMMUNITY)
Admission: RE | Admit: 2016-08-07 | Discharge: 2016-08-07 | Disposition: A | Discharge status: Home / Self Care | Payer: Medicare Other | Source: Ambulatory Visit | Attending: Internal Medicine | Admitting: Internal Medicine

## 2016-08-07 ENCOUNTER — Encounter (HOSPITAL_COMMUNITY)
Admission: RE | Admit: 2016-08-07 | Discharge: 2016-08-07 | Disposition: A | Discharge status: Home / Self Care | Payer: Medicare Other | Source: Ambulatory Visit | Attending: Internal Medicine | Admitting: Internal Medicine

## 2016-08-07 DIAGNOSIS — R918 Other nonspecific abnormal finding of lung field: Secondary | ICD-10-CM | POA: Insufficient documentation

## 2016-08-07 DIAGNOSIS — C7951 Secondary malignant neoplasm of bone: Secondary | ICD-10-CM | POA: Diagnosis not present

## 2016-08-07 DIAGNOSIS — C61 Malignant neoplasm of prostate: Secondary | ICD-10-CM

## 2016-08-07 DIAGNOSIS — I722 Aneurysm of renal artery: Secondary | ICD-10-CM | POA: Diagnosis not present

## 2016-08-07 DIAGNOSIS — K7689 Other specified diseases of liver: Secondary | ICD-10-CM | POA: Diagnosis not present

## 2016-08-07 MED ORDER — TECHNETIUM TC 99M MEDRONATE IV KIT
21.7000 | PACK | Freq: Once | INTRAVENOUS | Status: AC | PRN
  Administered 2016-08-07: 21.7 via INTRAVENOUS

## 2016-08-07 MED ORDER — IOPAMIDOL (ISOVUE-300) INJECTION 61%
INTRAVENOUS | Status: AC
  Filled 2016-08-07: qty 100

## 2016-08-07 NOTE — Progress Notes (Signed)
Called to eval pt for IV contrast extravasation of the (R)UE Pt for CT C/A/P Per tech report, approximately 20-25 mL contrast extrav at (R)AC PIV site. IV removed and warm compress applied immediately and arm elevated.  Site evaluated, small amount of focal swelling, NT No ecchymosis or skin discoloration. Neurovascular intact distal to site.  Reviewed instructions and what to look for, but expect complete resolution without sequelae in the next 24 hrs. Will follow up with pt tomorrow.  Ascencion Dike PA-C Interventional Radiology 08/07/2016 8:35 AM

## 2016-08-08 MED ORDER — IOPAMIDOL (ISOVUE-300) INJECTION 61%
75.0000 mL | Freq: Once | INTRAVENOUS | Status: AC | PRN
  Administered 2016-08-08: 75 mL via INTRAVENOUS

## 2016-08-09 NOTE — Progress Notes (Signed)
I spoke to Jacob Becker this morning at 9:00am. He told me that his arm swelling has resolved, and he denied any bruising, pain, or discoloration of skin.I instructed him to call us if needed.

## 2016-08-16 DIAGNOSIS — R5383 Other fatigue: Secondary | ICD-10-CM | POA: Diagnosis not present

## 2016-08-16 DIAGNOSIS — Z789 Other specified health status: Secondary | ICD-10-CM | POA: Diagnosis not present

## 2016-08-16 DIAGNOSIS — Z923 Personal history of irradiation: Secondary | ICD-10-CM | POA: Diagnosis not present

## 2016-08-16 DIAGNOSIS — Z85828 Personal history of other malignant neoplasm of skin: Secondary | ICD-10-CM | POA: Diagnosis not present

## 2016-08-16 DIAGNOSIS — Z8546 Personal history of malignant neoplasm of prostate: Secondary | ICD-10-CM | POA: Diagnosis not present

## 2016-08-16 DIAGNOSIS — R972 Elevated prostate specific antigen [PSA]: Secondary | ICD-10-CM | POA: Diagnosis not present

## 2016-08-16 DIAGNOSIS — C61 Malignant neoplasm of prostate: Secondary | ICD-10-CM | POA: Diagnosis not present

## 2016-08-19 DIAGNOSIS — R351 Nocturia: Secondary | ICD-10-CM | POA: Diagnosis not present

## 2016-08-19 DIAGNOSIS — Z79818 Long term (current) use of other agents affecting estrogen receptors and estrogen levels: Secondary | ICD-10-CM | POA: Diagnosis not present

## 2016-08-19 DIAGNOSIS — Z8546 Personal history of malignant neoplasm of prostate: Secondary | ICD-10-CM | POA: Diagnosis not present

## 2016-08-19 DIAGNOSIS — Z923 Personal history of irradiation: Secondary | ICD-10-CM | POA: Diagnosis not present

## 2016-08-19 DIAGNOSIS — R5383 Other fatigue: Secondary | ICD-10-CM | POA: Diagnosis not present

## 2016-08-19 DIAGNOSIS — N529 Male erectile dysfunction, unspecified: Secondary | ICD-10-CM | POA: Diagnosis not present

## 2016-08-19 DIAGNOSIS — Z789 Other specified health status: Secondary | ICD-10-CM | POA: Diagnosis not present

## 2016-08-19 DIAGNOSIS — R972 Elevated prostate specific antigen [PSA]: Secondary | ICD-10-CM | POA: Diagnosis not present

## 2016-08-19 DIAGNOSIS — Z85828 Personal history of other malignant neoplasm of skin: Secondary | ICD-10-CM | POA: Diagnosis not present

## 2016-08-19 DIAGNOSIS — C61 Malignant neoplasm of prostate: Secondary | ICD-10-CM | POA: Diagnosis not present

## 2016-08-19 DIAGNOSIS — I4891 Unspecified atrial fibrillation: Secondary | ICD-10-CM | POA: Diagnosis not present

## 2016-08-19 DIAGNOSIS — M545 Low back pain: Secondary | ICD-10-CM | POA: Diagnosis not present

## 2016-10-09 DIAGNOSIS — H52203 Unspecified astigmatism, bilateral: Secondary | ICD-10-CM | POA: Diagnosis not present

## 2016-10-09 DIAGNOSIS — H35363 Drusen (degenerative) of macula, bilateral: Secondary | ICD-10-CM | POA: Diagnosis not present

## 2016-10-09 DIAGNOSIS — H04123 Dry eye syndrome of bilateral lacrimal glands: Secondary | ICD-10-CM | POA: Diagnosis not present

## 2016-10-23 ENCOUNTER — Ambulatory Visit: Payer: Medicare Other | Admitting: Podiatry

## 2016-10-24 ENCOUNTER — Ambulatory Visit (INDEPENDENT_AMBULATORY_CARE_PROVIDER_SITE_OTHER): Payer: Medicare Other | Admitting: Podiatry

## 2016-10-24 ENCOUNTER — Encounter: Payer: Self-pay | Admitting: Podiatry

## 2016-10-24 DIAGNOSIS — B351 Tinea unguium: Secondary | ICD-10-CM | POA: Diagnosis not present

## 2016-10-24 DIAGNOSIS — M79674 Pain in right toe(s): Secondary | ICD-10-CM

## 2016-10-24 DIAGNOSIS — M79675 Pain in left toe(s): Secondary | ICD-10-CM | POA: Diagnosis not present

## 2016-10-24 NOTE — Progress Notes (Signed)
Subjective:    Patient ID: Jacob Becker, male   DOB: 83 y.o.   MRN: [id number]   HPI patient presents with elongated thick painful nailbeds 1-5 both feet that he cannot cut    ROS      Objective:  Physical Exam neurovascular status intact with yellow brittle nailbeds 1-5 both feet that are painful     Assessment:    Chronic mycotic nail infected 1-5 nails bilateral that are painful when palpated     Plan:    Reviewed and debrided nailbeds 1-5 both feet with mechanical smoothing occurring with no iatrogenic bleeding noted

## 2016-10-30 DIAGNOSIS — Z23 Encounter for immunization: Secondary | ICD-10-CM | POA: Diagnosis not present

## 2016-11-15 DIAGNOSIS — Z789 Other specified health status: Secondary | ICD-10-CM | POA: Diagnosis not present

## 2016-11-15 DIAGNOSIS — Z8546 Personal history of malignant neoplasm of prostate: Secondary | ICD-10-CM | POA: Diagnosis not present

## 2016-11-15 DIAGNOSIS — C61 Malignant neoplasm of prostate: Secondary | ICD-10-CM | POA: Diagnosis not present

## 2016-11-15 DIAGNOSIS — R5383 Other fatigue: Secondary | ICD-10-CM | POA: Diagnosis not present

## 2016-11-15 DIAGNOSIS — Z923 Personal history of irradiation: Secondary | ICD-10-CM | POA: Diagnosis not present

## 2016-11-15 DIAGNOSIS — Z85828 Personal history of other malignant neoplasm of skin: Secondary | ICD-10-CM | POA: Diagnosis not present

## 2016-11-15 DIAGNOSIS — R972 Elevated prostate specific antigen [PSA]: Secondary | ICD-10-CM | POA: Diagnosis not present

## 2016-11-18 DIAGNOSIS — R5383 Other fatigue: Secondary | ICD-10-CM | POA: Diagnosis not present

## 2016-11-18 DIAGNOSIS — M545 Low back pain: Secondary | ICD-10-CM | POA: Diagnosis not present

## 2016-11-18 DIAGNOSIS — R972 Elevated prostate specific antigen [PSA]: Secondary | ICD-10-CM | POA: Diagnosis not present

## 2016-11-18 DIAGNOSIS — Z85828 Personal history of other malignant neoplasm of skin: Secondary | ICD-10-CM | POA: Diagnosis not present

## 2016-11-18 DIAGNOSIS — C61 Malignant neoplasm of prostate: Secondary | ICD-10-CM | POA: Diagnosis not present

## 2016-11-18 DIAGNOSIS — Z789 Other specified health status: Secondary | ICD-10-CM | POA: Diagnosis not present

## 2016-11-18 DIAGNOSIS — N529 Male erectile dysfunction, unspecified: Secondary | ICD-10-CM | POA: Diagnosis not present

## 2016-11-18 DIAGNOSIS — I4891 Unspecified atrial fibrillation: Secondary | ICD-10-CM | POA: Diagnosis not present

## 2016-11-18 DIAGNOSIS — Z8546 Personal history of malignant neoplasm of prostate: Secondary | ICD-10-CM | POA: Diagnosis not present

## 2016-11-18 DIAGNOSIS — Z79818 Long term (current) use of other agents affecting estrogen receptors and estrogen levels: Secondary | ICD-10-CM | POA: Diagnosis not present

## 2016-11-18 DIAGNOSIS — R351 Nocturia: Secondary | ICD-10-CM | POA: Diagnosis not present

## 2016-11-18 DIAGNOSIS — Z923 Personal history of irradiation: Secondary | ICD-10-CM | POA: Diagnosis not present

## 2016-12-02 DIAGNOSIS — E7800 Pure hypercholesterolemia, unspecified: Secondary | ICD-10-CM | POA: Diagnosis not present

## 2016-12-02 DIAGNOSIS — Z23 Encounter for immunization: Secondary | ICD-10-CM | POA: Diagnosis not present

## 2016-12-02 DIAGNOSIS — I10 Essential (primary) hypertension: Secondary | ICD-10-CM | POA: Diagnosis not present

## 2016-12-02 DIAGNOSIS — Z1389 Encounter for screening for other disorder: Secondary | ICD-10-CM | POA: Diagnosis not present

## 2016-12-02 DIAGNOSIS — C61 Malignant neoplasm of prostate: Secondary | ICD-10-CM | POA: Diagnosis not present

## 2016-12-02 DIAGNOSIS — Z0000 Encounter for general adult medical examination without abnormal findings: Secondary | ICD-10-CM | POA: Diagnosis not present

## 2016-12-02 DIAGNOSIS — I482 Chronic atrial fibrillation: Secondary | ICD-10-CM | POA: Diagnosis not present

## 2016-12-30 IMAGING — NM NM BONE WHOLE BODY
4 series · 4 of 4 positions shown · non-contrast
Comparison: Bone scan 06/13/2015, CT 10/23/2015

CLINICAL DATA: Prostate carcinoma with stable skeletal metastasis.

EXAM:
NUCLEAR MEDICINE WHOLE BODY BONE SCAN
TECHNIQUE: Whole body anterior and posterior images were obtained approximately
3 hours after intravenous injection of radiopharmaceutical.
RADIOPHARMACEUTICALS:  25.9 mCi 9echnetium-TTm MDP IV

[Series 1: whole body · 2.66mm/px · 1 of 1 slices shown (1 of 2)]
[im 1/1]
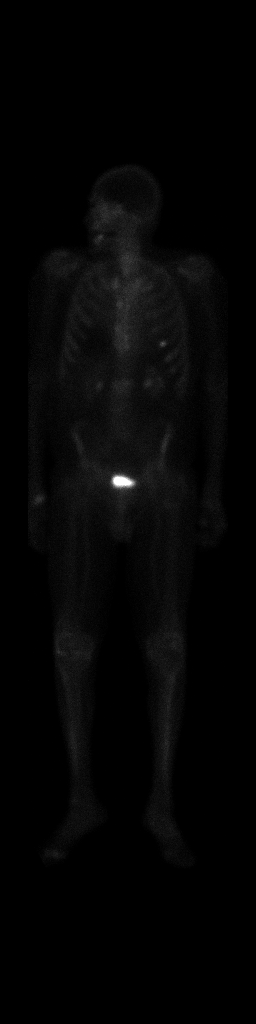

[Series 1: wbr_bone_60 whole body · 2.66mm/px · 1 of 1 slices shown (1 of 2)]
[im 1/1]
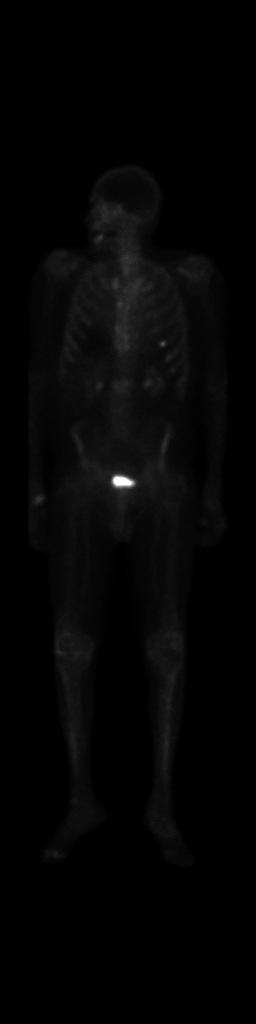

[Series 1: whole body · 2.66mm/px · 1 of 1 slices shown (2 of 2)]
[im 1/1]
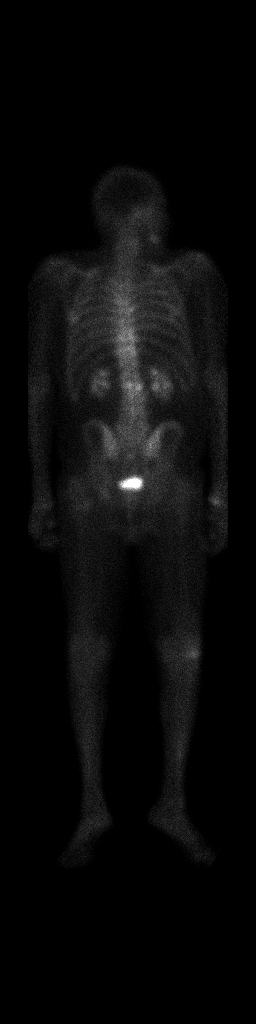

[Series 1: wbr_bone_60 whole body · 2.66mm/px · 1 of 1 slices shown (2 of 2)]
[im 1/1]
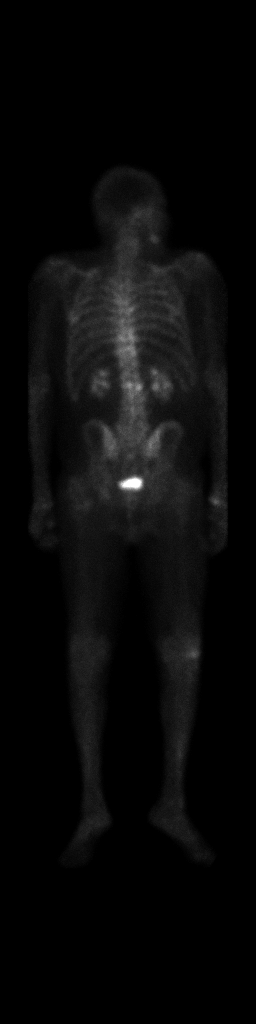

[4 of 4 positions shown; findings below may reference images not displayed]

FINDINGS: Skeletal metastasis again noted within the pelvis, sternum, and
ribs. No new or increased sites of radiotracer accumulation are
evident. Odontogenic uptake noted in the RIGHT mandible.

The number of abnormal foci of radiotracer accumulation is less than
the number of sclerotic lesion seen on comparison CT scan.
IMPRESSION: Stable skeletal metastasis within the spine, pelvis, sternum and
ribs.

## 2017-01-16 ENCOUNTER — Other Ambulatory Visit (HOSPITAL_COMMUNITY): Payer: Self-pay | Admitting: Internal Medicine

## 2017-01-16 DIAGNOSIS — C61 Malignant neoplasm of prostate: Secondary | ICD-10-CM

## 2017-01-22 ENCOUNTER — Other Ambulatory Visit: Payer: Self-pay | Admitting: Interventional Cardiology

## 2017-02-05 ENCOUNTER — Encounter (HOSPITAL_COMMUNITY)
Admission: RE | Admit: 2017-02-05 | Discharge: 2017-02-05 | Disposition: A | Discharge status: Home / Self Care | Payer: Medicare Other | Source: Ambulatory Visit | Attending: Internal Medicine | Admitting: Internal Medicine

## 2017-02-05 ENCOUNTER — Ambulatory Visit (HOSPITAL_COMMUNITY): Payer: Medicare Other

## 2017-02-05 ENCOUNTER — Ambulatory Visit (HOSPITAL_COMMUNITY)
Admission: RE | Admit: 2017-02-05 | Discharge: 2017-02-05 | Disposition: A | Discharge status: Home / Self Care | Payer: Medicare Other | Source: Ambulatory Visit | Attending: Internal Medicine | Admitting: Internal Medicine

## 2017-02-05 ENCOUNTER — Encounter (HOSPITAL_COMMUNITY): Payer: Medicare Other

## 2017-02-05 ENCOUNTER — Encounter (HOSPITAL_COMMUNITY): Admission: RE | Admit: 2017-02-05 | Payer: Medicare Other | Source: Ambulatory Visit

## 2017-02-05 ENCOUNTER — Encounter (HOSPITAL_COMMUNITY): Payer: Self-pay

## 2017-02-05 DIAGNOSIS — C7951 Secondary malignant neoplasm of bone: Secondary | ICD-10-CM | POA: Diagnosis not present

## 2017-02-05 DIAGNOSIS — C61 Malignant neoplasm of prostate: Secondary | ICD-10-CM | POA: Diagnosis not present

## 2017-02-05 DIAGNOSIS — I2510 Atherosclerotic heart disease of native coronary artery without angina pectoris: Secondary | ICD-10-CM | POA: Diagnosis not present

## 2017-02-05 DIAGNOSIS — K7689 Other specified diseases of liver: Secondary | ICD-10-CM | POA: Insufficient documentation

## 2017-02-05 MED ORDER — TECHNETIUM TC 99M MEDRONATE IV KIT
21.8000 | PACK | Freq: Once | INTRAVENOUS | Status: AC | PRN
  Administered 2017-02-05: 21.8 via INTRAVENOUS

## 2017-02-05 MED ORDER — TECHNETIUM TC 99M MEDRONATE IV KIT
21.7000 | PACK | Freq: Once | INTRAVENOUS | Status: AC | PRN
  Administered 2017-02-05: 21.7 via INTRAVENOUS

## 2017-02-05 MED ORDER — IOPAMIDOL (ISOVUE-300) INJECTION 61%
INTRAVENOUS | Status: AC
  Administered 2017-02-05: 75 mL via INTRAVENOUS
  Filled 2017-02-05: qty 75

## 2017-02-06 ENCOUNTER — Other Ambulatory Visit (HOSPITAL_COMMUNITY): Payer: Medicare Other

## 2017-02-07 DIAGNOSIS — Z85828 Personal history of other malignant neoplasm of skin: Secondary | ICD-10-CM | POA: Diagnosis not present

## 2017-02-07 DIAGNOSIS — Z8546 Personal history of malignant neoplasm of prostate: Secondary | ICD-10-CM | POA: Diagnosis not present

## 2017-02-07 DIAGNOSIS — R972 Elevated prostate specific antigen [PSA]: Secondary | ICD-10-CM | POA: Diagnosis not present

## 2017-02-07 DIAGNOSIS — R5383 Other fatigue: Secondary | ICD-10-CM | POA: Diagnosis not present

## 2017-02-07 DIAGNOSIS — C61 Malignant neoplasm of prostate: Secondary | ICD-10-CM | POA: Diagnosis not present

## 2017-02-07 DIAGNOSIS — Z789 Other specified health status: Secondary | ICD-10-CM | POA: Diagnosis not present

## 2017-02-07 DIAGNOSIS — Z923 Personal history of irradiation: Secondary | ICD-10-CM | POA: Diagnosis not present

## 2017-02-10 DIAGNOSIS — M545 Low back pain: Secondary | ICD-10-CM | POA: Diagnosis not present

## 2017-02-10 DIAGNOSIS — Z79818 Long term (current) use of other agents affecting estrogen receptors and estrogen levels: Secondary | ICD-10-CM | POA: Diagnosis not present

## 2017-02-10 DIAGNOSIS — R5383 Other fatigue: Secondary | ICD-10-CM | POA: Diagnosis not present

## 2017-02-10 DIAGNOSIS — R351 Nocturia: Secondary | ICD-10-CM | POA: Diagnosis not present

## 2017-02-10 DIAGNOSIS — Z8546 Personal history of malignant neoplasm of prostate: Secondary | ICD-10-CM | POA: Diagnosis not present

## 2017-02-10 DIAGNOSIS — I4891 Unspecified atrial fibrillation: Secondary | ICD-10-CM | POA: Diagnosis not present

## 2017-02-10 DIAGNOSIS — R972 Elevated prostate specific antigen [PSA]: Secondary | ICD-10-CM | POA: Diagnosis not present

## 2017-02-10 DIAGNOSIS — Z789 Other specified health status: Secondary | ICD-10-CM | POA: Diagnosis not present

## 2017-02-10 DIAGNOSIS — Z923 Personal history of irradiation: Secondary | ICD-10-CM | POA: Diagnosis not present

## 2017-02-10 DIAGNOSIS — N529 Male erectile dysfunction, unspecified: Secondary | ICD-10-CM | POA: Diagnosis not present

## 2017-02-10 DIAGNOSIS — C61 Malignant neoplasm of prostate: Secondary | ICD-10-CM | POA: Diagnosis not present

## 2017-02-10 DIAGNOSIS — Z85828 Personal history of other malignant neoplasm of skin: Secondary | ICD-10-CM | POA: Diagnosis not present

## 2017-02-18 ENCOUNTER — Other Ambulatory Visit: Payer: Self-pay | Admitting: Interventional Cardiology

## 2017-02-21 DIAGNOSIS — D1801 Hemangioma of skin and subcutaneous tissue: Secondary | ICD-10-CM | POA: Diagnosis not present

## 2017-02-21 DIAGNOSIS — D0422 Carcinoma in situ of skin of left ear and external auricular canal: Secondary | ICD-10-CM | POA: Diagnosis not present

## 2017-02-21 DIAGNOSIS — D485 Neoplasm of uncertain behavior of skin: Secondary | ICD-10-CM | POA: Diagnosis not present

## 2017-02-21 DIAGNOSIS — C4402 Squamous cell carcinoma of skin of lip: Secondary | ICD-10-CM | POA: Diagnosis not present

## 2017-02-21 DIAGNOSIS — L570 Actinic keratosis: Secondary | ICD-10-CM | POA: Diagnosis not present

## 2017-02-21 DIAGNOSIS — Z85828 Personal history of other malignant neoplasm of skin: Secondary | ICD-10-CM | POA: Diagnosis not present

## 2017-02-21 DIAGNOSIS — C44329 Squamous cell carcinoma of skin of other parts of face: Secondary | ICD-10-CM | POA: Diagnosis not present

## 2017-03-17 DIAGNOSIS — N319 Neuromuscular dysfunction of bladder, unspecified: Secondary | ICD-10-CM | POA: Diagnosis not present

## 2017-03-17 DIAGNOSIS — R339 Retention of urine, unspecified: Secondary | ICD-10-CM | POA: Diagnosis not present

## 2017-03-17 DIAGNOSIS — C61 Malignant neoplasm of prostate: Secondary | ICD-10-CM | POA: Diagnosis not present

## 2017-03-17 DIAGNOSIS — N35014 Post-traumatic urethral stricture, male, unspecified: Secondary | ICD-10-CM | POA: Diagnosis not present

## 2017-03-17 DIAGNOSIS — I482 Chronic atrial fibrillation: Secondary | ICD-10-CM | POA: Diagnosis not present

## 2017-03-20 ENCOUNTER — Encounter: Payer: Self-pay | Admitting: Interventional Cardiology

## 2017-03-27 ENCOUNTER — Ambulatory Visit (INDEPENDENT_AMBULATORY_CARE_PROVIDER_SITE_OTHER): Payer: Medicare Other | Admitting: Podiatry

## 2017-03-27 ENCOUNTER — Ambulatory Visit: Payer: Medicare Other | Admitting: Interventional Cardiology

## 2017-03-27 ENCOUNTER — Encounter: Payer: Self-pay | Admitting: Podiatry

## 2017-03-27 DIAGNOSIS — B351 Tinea unguium: Secondary | ICD-10-CM | POA: Diagnosis not present

## 2017-03-27 DIAGNOSIS — M79675 Pain in left toe(s): Secondary | ICD-10-CM | POA: Diagnosis not present

## 2017-03-27 DIAGNOSIS — M79674 Pain in right toe(s): Secondary | ICD-10-CM | POA: Diagnosis not present

## 2017-03-27 NOTE — Progress Notes (Signed)
Subjective:   Patient ID: Jacob Becker, male   DOB: 84 y.o.   MRN: [id number]   HPI Patient presents with thick yellow brittle nailbeds 1-5 both feet that are painful   ROS      Objective:  Physical Exam  Neurovascular status intact with thick yellow brittle nailbeds 1-5 both feet that are painful when palpated     Assessment:  Mycotic nail infection 1-5 both feet with pain     Plan:  Debride painful nailbeds 1-5 both feet with no iatrogenic bleeding noted

## 2017-03-28 ENCOUNTER — Other Ambulatory Visit: Payer: Self-pay | Admitting: Interventional Cardiology

## 2017-04-03 ENCOUNTER — Other Ambulatory Visit: Payer: Self-pay | Admitting: Interventional Cardiology

## 2017-04-03 MED ORDER — TRIAMTERENE-HCTZ 37.5-25 MG PO CAPS: ORAL_CAPSULE | ORAL | 2 refills | Status: DC

## 2017-04-03 NOTE — Addendum Note (Signed)
Addended by: Derl Barrow on: 04/03/2017 10:34 AM   Modules accepted: Orders

## 2017-04-03 NOTE — Telephone Encounter (Signed)
Pt's medication was resent to pharmacy correct pharmacy as requested. Confirmation received.

## 2017-04-21 DIAGNOSIS — C4402 Squamous cell carcinoma of skin of lip: Secondary | ICD-10-CM | POA: Diagnosis not present

## 2017-04-21 DIAGNOSIS — Z85828 Personal history of other malignant neoplasm of skin: Secondary | ICD-10-CM | POA: Diagnosis not present

## 2017-04-25 ENCOUNTER — Other Ambulatory Visit: Payer: Self-pay | Admitting: Interventional Cardiology

## 2017-04-28 DIAGNOSIS — L570 Actinic keratosis: Secondary | ICD-10-CM | POA: Diagnosis not present

## 2017-05-29 DIAGNOSIS — Z85828 Personal history of other malignant neoplasm of skin: Secondary | ICD-10-CM | POA: Diagnosis not present

## 2017-05-29 DIAGNOSIS — D485 Neoplasm of uncertain behavior of skin: Secondary | ICD-10-CM | POA: Diagnosis not present

## 2017-05-29 DIAGNOSIS — D044 Carcinoma in situ of skin of scalp and neck: Secondary | ICD-10-CM | POA: Diagnosis not present

## 2017-05-30 DIAGNOSIS — Z85828 Personal history of other malignant neoplasm of skin: Secondary | ICD-10-CM | POA: Diagnosis not present

## 2017-05-30 DIAGNOSIS — Z8546 Personal history of malignant neoplasm of prostate: Secondary | ICD-10-CM | POA: Diagnosis not present

## 2017-05-30 DIAGNOSIS — R5383 Other fatigue: Secondary | ICD-10-CM | POA: Diagnosis not present

## 2017-05-30 DIAGNOSIS — R972 Elevated prostate specific antigen [PSA]: Secondary | ICD-10-CM | POA: Diagnosis not present

## 2017-05-30 DIAGNOSIS — C61 Malignant neoplasm of prostate: Secondary | ICD-10-CM | POA: Diagnosis not present

## 2017-05-30 DIAGNOSIS — Z923 Personal history of irradiation: Secondary | ICD-10-CM | POA: Diagnosis not present

## 2017-05-30 DIAGNOSIS — Z789 Other specified health status: Secondary | ICD-10-CM | POA: Diagnosis not present

## 2017-06-02 DIAGNOSIS — I4891 Unspecified atrial fibrillation: Secondary | ICD-10-CM | POA: Diagnosis not present

## 2017-06-02 DIAGNOSIS — C61 Malignant neoplasm of prostate: Secondary | ICD-10-CM | POA: Diagnosis not present

## 2017-06-02 DIAGNOSIS — R351 Nocturia: Secondary | ICD-10-CM | POA: Diagnosis not present

## 2017-06-02 DIAGNOSIS — Z85828 Personal history of other malignant neoplasm of skin: Secondary | ICD-10-CM | POA: Diagnosis not present

## 2017-06-02 DIAGNOSIS — N529 Male erectile dysfunction, unspecified: Secondary | ICD-10-CM | POA: Diagnosis not present

## 2017-06-02 DIAGNOSIS — Z789 Other specified health status: Secondary | ICD-10-CM | POA: Diagnosis not present

## 2017-06-02 DIAGNOSIS — Z79818 Long term (current) use of other agents affecting estrogen receptors and estrogen levels: Secondary | ICD-10-CM | POA: Diagnosis not present

## 2017-06-02 DIAGNOSIS — D649 Anemia, unspecified: Secondary | ICD-10-CM | POA: Diagnosis not present

## 2017-06-02 DIAGNOSIS — Z8546 Personal history of malignant neoplasm of prostate: Secondary | ICD-10-CM | POA: Diagnosis not present

## 2017-06-02 DIAGNOSIS — R630 Anorexia: Secondary | ICD-10-CM | POA: Diagnosis not present

## 2017-06-02 DIAGNOSIS — R5383 Other fatigue: Secondary | ICD-10-CM | POA: Diagnosis not present

## 2017-06-02 DIAGNOSIS — Z923 Personal history of irradiation: Secondary | ICD-10-CM | POA: Diagnosis not present

## 2017-06-02 DIAGNOSIS — M545 Low back pain: Secondary | ICD-10-CM | POA: Diagnosis not present

## 2017-06-02 DIAGNOSIS — R972 Elevated prostate specific antigen [PSA]: Secondary | ICD-10-CM | POA: Diagnosis not present

## 2017-06-20 DIAGNOSIS — D485 Neoplasm of uncertain behavior of skin: Secondary | ICD-10-CM | POA: Diagnosis not present

## 2017-06-20 DIAGNOSIS — Z85828 Personal history of other malignant neoplasm of skin: Secondary | ICD-10-CM | POA: Diagnosis not present

## 2017-06-20 DIAGNOSIS — I788 Other diseases of capillaries: Secondary | ICD-10-CM | POA: Diagnosis not present

## 2017-06-23 NOTE — Progress Notes (Signed)
Cardiology Office Note    Date:  06/24/2017   ID:  Jacob Becker, DOB 14-Oct-1929, MRN [id number]  PCP:  Jacob Manes, MD  Cardiologist: Jacob Grooms, MD   Chief Complaint  Patient presents with  . Atrial Fibrillation  . Coronary Artery Disease    History of Present Illness:  Jacob Becker is a 84 y.o. male who presents for essential hypertension, hyperlipidemia, coronary artery disease, chronic atrial fibrillation and chronic anticoagulation therapy  Overall, Jacob Becker is doing quite well.  Now 84 years of age, still arises at 56 AM and is in his office by 7:30 AM each morning.  States his wife is having some memory issues.  He denies syncope, palpitations, and angina.  He has not noticed edema in his lower extremities or orthopnea.  Still goes to [facility] 3 times per year for management of prostate cancer.  PSA is less than 1.0.   Past Medical History:  Diagnosis Date  . AF (atrial fibrillation) ([employer])   . Arthritis   . Cancer [facility])    prostate  . Dysrhythmia    af  . Hyperlipemia   . Hypertension   . Metastatic bone cancer (HCC)    from prostate  . Wears glasses     Past Surgical History:  Procedure Laterality Date  . CHEILECTOMY Right 10/07/2013   Procedure: RIGHT HALLUX CHEILECTOMY;  Surgeon: Jacob Simmer, MD;  Location: [place];  Service: Orthopedics;  Laterality: Right;  . COLONOSCOPY    . CYSTOSCOPY PROSTATE W/ LASER  2012   turp  . HERNIA REPAIR     rt and lt ing  . KNEE ARTHROSCOPY  2005   right  . TONSILLECTOMY      Current Medications: Outpatient Medications Prior to Visit  Medication Sig Dispense Refill  . amLODipine (NORVASC) 5 MG tablet TAKE ONE TABLET DAILY 30 tablet 2  . Calcium-Magnesium-Vitamin D (CALCIUM 500 PO) Take 500 mg by mouth 2 (two) times daily.    . Coenzyme Q10 (CO Q 10) 100 MG CAPS Take 100 mg by mouth daily.    . colesevelam (WELCHOL) 625 MG tablet Take 625 mg by mouth 2 (two) times daily with a meal.     . enzalutamide (XTANDI) 40 MG capsule Take 160 mg by mouth daily.    . metoprolol succinate (TOPROL-XL) 100 MG 24 hr tablet TAKE ONE TABLET TWICE DAILY 60 tablet 2  . Multiple Vitamin (MULTIVITAMIN) capsule Take 1 capsule by mouth daily.    . nitroGLYCERIN (NITROSTAT) 0.4 MG SL tablet ONE TABLET UNDER TONGUE EVERY 5 MINUTES AS NEEDED FOR CHEST PAIN 25 tablet 3  . Omega-3 Fatty Acids (FISH OIL PO) Take 1,000 mg by mouth daily.    . ramipril (ALTACE) 10 MG capsule TAKE ONE CAPSULE TWICE A DAY 60 capsule 2  . rosuvastatin (CRESTOR) 40 MG tablet Take 40 mg by mouth daily.    Jacob Becker 20 MG TABS tablet TAKE ONE TABLET EACH DAY 30 tablet 6   No facility-administered medications prior to visit.      Allergies:   Patient has no known allergies.   Social History   Socioeconomic History  . Marital status: Married    Spouse name: Not on file  . Number of children: Not on file  . Years of education: Not on file  . Highest education level: Not on file  Occupational History  . Not on file  Social Needs  . Financial resource strain:  Not on file  . Food insecurity:    Worry: Not on file    Inability: Not on file  . Transportation needs:    Medical: Not on file    Non-medical: Not on file  Tobacco Use  . Smoking status: Former Smoker    Last attempt to quit: 10/02/1971    Years since quitting: 45.7  . Smokeless tobacco: Never Used  Substance and Sexual Activity  . Alcohol use: Yes    Comment: daily  . Drug use: No  . Sexual activity: Not on file  Lifestyle  . Physical activity:    Days per week: Not on file    Minutes per session: Not on file  . Stress: Not on file  Relationships  . Social connections:    Talks on phone: Not on file    Gets together: Not on file    Attends religious service: Not on file    Active member of club or organization: Not on file    Attends meetings of clubs or organizations: Not on file    Relationship status: Not on file  Other Topics Concern  .  Not on file  Social History Narrative  . Not on file     Family History:  The patient's family history includes Heart failure in his father.   ROS:   Please see the history of present illness.    He has occasional hematuria related to self catheterization.  He has fallen 2 or 3 times but not bumped his head.  These falls have been related to tripping on objects that he did not see. All other systems reviewed and are negative.   PHYSICAL EXAM:   VS:  BP (!) 148/85   Pulse 79   Ht 6\' 2"  (1.88 m)   Wt 184 lb (83.5 kg)   SpO2 95%   BMI 23.62 kg/m    GEN: Well nourished, well developed, in no acute distress  HEENT: normal  Neck: no JVD, carotid bruits, or masses Cardiac: IIRR; no murmurs, rubs, or gallops,no edema  Respiratory:  clear to auscultation bilaterally, normal work of breathing GI: soft, nontender, nondistended, + BS MS: no deformity or atrophy  Skin: warm and dry, no rash Neuro:  Alert and Oriented x 3, Strength and sensation are intact Psych: euthymic mood, full affect  Wt Readings from Last 3 Encounters:  06/24/17 184 lb (83.5 kg)  12/25/15 190 lb 3.2 oz (86.3 kg)  07/08/14 199 lb (90.3 kg)      Studies/Labs Reviewed:   EKG:  EKG atrial fibrillation, incomplete right bundle branch block, controlled rate.  Nonspecific T wave flattening.  Recent Labs: No results found for requested labs within last 8760 hours.   Lipid Panel    Component Value Date/Time   CHOL 162 09/03/2013 0749   TRIG 87.0 09/03/2013 0749   HDL 56.10 09/03/2013 0749   CHOLHDL 3 09/03/2013 0749   VLDL 17.4 09/03/2013 0749   LDLCALC 89 09/03/2013 0749    Additional studies/ records that were reviewed today include:  PSA less than 1.0 hemoglobin recently documented to be 10.9.  Hematocrit 34.8 platelet count 173,000 BUN and creatinine 26 and 1.1 LDL cholesterol 88 total cholesterol 156    ASSESSMENT:    1. Essential hypertension   2. Other hyperlipidemia   3. Prostate cancer ([place])    4. Coronary artery disease involving native coronary artery of native heart without angina pectoris   5. Chronic anticoagulation   6. Chronic atrial fibrillation (  [place])   7. Iron deficiency anemia due to chronic blood loss      PLAN:  In order of problems listed above:  1. Preferred blood pressure 140/80 mmHg.  Target 130/80.  Encouraged decreasing sodium intake. 2. Currently taking high intensity statin therapy.  No change in intensity of medication regimen. 3. PSA less than 1.0. 4. Asymptomatic with good quality of life and activity levels. 5. Occasional urinary bleeding related to self-catheterization. 6. Control rate without complications. 7. Suspected.  Low-dose iron is started today.  Iron is started because of low hemoglobin as noted above. Clinical follow-up in 1 year.  Call if head trauma of any magnitude.   Medication Adjustments/Labs and Tests Ordered: Current medicines are reviewed at length with the patient today.  Concerns regarding medicines are outlined above.  Medication changes, Labs and Tests ordered today are listed in the Patient Instructions below. Patient Instructions  Medication Instructions:  1) START Iron (Ferrous Sulfate) 325mg  once daily  Labwork: None  Testing/Procedures: None  Follow-Up: Your physician wants you to follow-up in: 1 year with Dr. Tamala Julian.  You will receive a reminder letter in the mail two months in advance. If you don't receive a letter, please call our office to schedule the follow-up appointment.   Any Other Special Instructions Will Be Listed Below (If Applicable).     If you need a refill on your cardiac medications before your next appointment, please call your pharmacy.      Signed, Jacob Grooms, MD  06/24/2017 8:42 AM    [facility] [address], [place], [place]  [address] Phone: [phone]; Fax: [phone]

## 2017-06-24 ENCOUNTER — Encounter: Payer: Self-pay | Admitting: Interventional Cardiology

## 2017-06-24 ENCOUNTER — Encounter

## 2017-06-24 ENCOUNTER — Ambulatory Visit (INDEPENDENT_AMBULATORY_CARE_PROVIDER_SITE_OTHER): Payer: Medicare Other | Admitting: Interventional Cardiology

## 2017-06-24 VITALS — BP 148/85 | HR 79 | Ht 74.0 in | Wt 184.0 lb

## 2017-06-24 DIAGNOSIS — I10 Essential (primary) hypertension: Secondary | ICD-10-CM | POA: Diagnosis not present

## 2017-06-24 DIAGNOSIS — I482 Chronic atrial fibrillation, unspecified: Secondary | ICD-10-CM

## 2017-06-24 DIAGNOSIS — D500 Iron deficiency anemia secondary to blood loss (chronic): Secondary | ICD-10-CM

## 2017-06-24 DIAGNOSIS — E7849 Other hyperlipidemia: Secondary | ICD-10-CM

## 2017-06-24 DIAGNOSIS — Z7901 Long term (current) use of anticoagulants: Secondary | ICD-10-CM

## 2017-06-24 DIAGNOSIS — I2510 Atherosclerotic heart disease of native coronary artery without angina pectoris: Secondary | ICD-10-CM | POA: Diagnosis not present

## 2017-06-24 DIAGNOSIS — D509 Iron deficiency anemia, unspecified: Secondary | ICD-10-CM | POA: Insufficient documentation

## 2017-06-24 DIAGNOSIS — C61 Malignant neoplasm of prostate: Secondary | ICD-10-CM

## 2017-06-24 MED ORDER — FERROUS SULFATE 325 (65 FE) MG PO TABS: 325.0000 mg | ORAL_TABLET | Freq: Every day | ORAL | 3 refills | Status: DC

## 2017-06-24 NOTE — Addendum Note (Signed)
Addended by: Loren Racer on: 06/24/2017 09:44 AM   Modules accepted: Orders

## 2017-06-24 NOTE — Patient Instructions (Signed)
Medication Instructions:  1) START Iron (Ferrous Sulfate) 325mg  once daily  Labwork: None  Testing/Procedures: None  Follow-Up: Your physician wants you to follow-up in: 1 year with Dr. Tamala Julian.  You will receive a reminder letter in the mail two months in advance. If you don't receive a letter, please call our office to schedule the follow-up appointment.   Any Other Special Instructions Will Be Listed Below (If Applicable).     If you need a refill on your cardiac medications before your next appointment, please call your pharmacy.

## 2017-06-25 ENCOUNTER — Encounter: Payer: Self-pay | Admitting: Podiatry

## 2017-06-25 ENCOUNTER — Ambulatory Visit (INDEPENDENT_AMBULATORY_CARE_PROVIDER_SITE_OTHER): Payer: Medicare Other | Admitting: Podiatry

## 2017-06-25 DIAGNOSIS — M79674 Pain in right toe(s): Secondary | ICD-10-CM | POA: Diagnosis not present

## 2017-06-25 DIAGNOSIS — B351 Tinea unguium: Secondary | ICD-10-CM

## 2017-06-25 DIAGNOSIS — M79675 Pain in left toe(s): Secondary | ICD-10-CM | POA: Diagnosis not present

## 2017-06-25 NOTE — Progress Notes (Signed)
Subjective:   Patient ID: Jacob Becker, male   DOB: 84 y.o.   MRN: [id number]   HPI Patient presents with thick yellow brittle nailbeds 1-5 both feet that are painful and he cannot cut   ROS      Objective:  Physical Exam  Neurovascular status intact with thick yellow brittle nailbeds 1-5 both feet that are painful     Assessment:  Mycotic nail infection with pain 1-5 both feet     Plan:  Debride painful nailbeds 1-5 both feet with no iatrogenic bleeding noted

## 2017-07-03 ENCOUNTER — Other Ambulatory Visit (HOSPITAL_COMMUNITY): Payer: Self-pay | Admitting: Internal Medicine

## 2017-07-03 DIAGNOSIS — C61 Malignant neoplasm of prostate: Secondary | ICD-10-CM

## 2017-07-17 ENCOUNTER — Encounter (HOSPITAL_COMMUNITY)
Admission: RE | Admit: 2017-07-17 | Discharge: 2017-07-17 | Disposition: A | Discharge status: Home / Self Care | Payer: Medicare Other | Source: Ambulatory Visit | Attending: Internal Medicine | Admitting: Internal Medicine

## 2017-07-17 ENCOUNTER — Ambulatory Visit (HOSPITAL_COMMUNITY)
Admission: RE | Admit: 2017-07-17 | Discharge: 2017-07-17 | Disposition: A | Discharge status: Home / Self Care | Payer: Medicare Other | Source: Ambulatory Visit | Attending: Internal Medicine | Admitting: Internal Medicine

## 2017-07-17 DIAGNOSIS — K7689 Other specified diseases of liver: Secondary | ICD-10-CM | POA: Diagnosis not present

## 2017-07-17 DIAGNOSIS — C7951 Secondary malignant neoplasm of bone: Secondary | ICD-10-CM | POA: Diagnosis not present

## 2017-07-17 DIAGNOSIS — C61 Malignant neoplasm of prostate: Secondary | ICD-10-CM | POA: Insufficient documentation

## 2017-07-17 MED ORDER — TECHNETIUM TC 99M MEDRONATE IV KIT
21.7000 | PACK | Freq: Once | INTRAVENOUS | Status: AC | PRN
  Administered 2017-07-17: 21.7 via INTRAVENOUS

## 2017-07-17 MED ORDER — IOHEXOL 300 MG/ML  SOLN
80.0000 mL | Freq: Once | INTRAMUSCULAR | Status: AC | PRN
  Administered 2017-07-17: 80 mL via INTRAVENOUS

## 2017-07-21 ENCOUNTER — Other Ambulatory Visit: Payer: Self-pay | Admitting: Interventional Cardiology

## 2017-07-24 DIAGNOSIS — Z85828 Personal history of other malignant neoplasm of skin: Secondary | ICD-10-CM | POA: Diagnosis not present

## 2017-07-24 DIAGNOSIS — L570 Actinic keratosis: Secondary | ICD-10-CM | POA: Diagnosis not present

## 2017-07-24 DIAGNOSIS — L858 Other specified epidermal thickening: Secondary | ICD-10-CM | POA: Diagnosis not present

## 2017-07-24 DIAGNOSIS — D0461 Carcinoma in situ of skin of right upper limb, including shoulder: Secondary | ICD-10-CM | POA: Diagnosis not present

## 2017-07-24 DIAGNOSIS — D485 Neoplasm of uncertain behavior of skin: Secondary | ICD-10-CM | POA: Diagnosis not present

## 2017-07-28 ENCOUNTER — Other Ambulatory Visit: Payer: Self-pay | Admitting: Interventional Cardiology

## 2017-07-28 DIAGNOSIS — N319 Neuromuscular dysfunction of bladder, unspecified: Secondary | ICD-10-CM | POA: Diagnosis not present

## 2017-07-28 DIAGNOSIS — N35014 Post-traumatic urethral stricture, male, unspecified: Secondary | ICD-10-CM | POA: Diagnosis not present

## 2017-07-28 DIAGNOSIS — R339 Retention of urine, unspecified: Secondary | ICD-10-CM | POA: Diagnosis not present

## 2017-07-28 DIAGNOSIS — R319 Hematuria, unspecified: Secondary | ICD-10-CM | POA: Diagnosis not present

## 2017-07-29 DIAGNOSIS — R339 Retention of urine, unspecified: Secondary | ICD-10-CM | POA: Diagnosis not present

## 2017-07-29 DIAGNOSIS — Z466 Encounter for fitting and adjustment of urinary device: Secondary | ICD-10-CM | POA: Diagnosis not present

## 2017-08-10 DIAGNOSIS — C7951 Secondary malignant neoplasm of bone: Secondary | ICD-10-CM | POA: Diagnosis not present

## 2017-08-10 DIAGNOSIS — C61 Malignant neoplasm of prostate: Secondary | ICD-10-CM | POA: Diagnosis not present

## 2017-08-25 DIAGNOSIS — Z85828 Personal history of other malignant neoplasm of skin: Secondary | ICD-10-CM | POA: Diagnosis not present

## 2017-08-25 DIAGNOSIS — L858 Other specified epidermal thickening: Secondary | ICD-10-CM | POA: Diagnosis not present

## 2017-08-29 ENCOUNTER — Other Ambulatory Visit: Payer: Self-pay | Admitting: Interventional Cardiology

## 2017-08-29 DIAGNOSIS — C61 Malignant neoplasm of prostate: Secondary | ICD-10-CM | POA: Diagnosis not present

## 2017-08-29 NOTE — Telephone Encounter (Signed)
Received a refill request from pharmacy for this medication.  Medication is not listed in patient's med list however I do not see where this was discontinued.  Is this ok to fill?

## 2017-09-08 NOTE — Telephone Encounter (Signed)
Called pt and clarified he has been taking this medication since prior to seeing [place] in May.  Advised I will send in prescription.  Pt appreciative for call.

## 2017-09-12 DIAGNOSIS — N529 Male erectile dysfunction, unspecified: Secondary | ICD-10-CM | POA: Diagnosis not present

## 2017-09-12 DIAGNOSIS — Z79818 Long term (current) use of other agents affecting estrogen receptors and estrogen levels: Secondary | ICD-10-CM | POA: Diagnosis not present

## 2017-09-12 DIAGNOSIS — R630 Anorexia: Secondary | ICD-10-CM | POA: Diagnosis not present

## 2017-09-12 DIAGNOSIS — Z923 Personal history of irradiation: Secondary | ICD-10-CM | POA: Diagnosis not present

## 2017-09-12 DIAGNOSIS — R972 Elevated prostate specific antigen [PSA]: Secondary | ICD-10-CM | POA: Diagnosis not present

## 2017-09-12 DIAGNOSIS — R5383 Other fatigue: Secondary | ICD-10-CM | POA: Diagnosis not present

## 2017-09-12 DIAGNOSIS — C61 Malignant neoplasm of prostate: Secondary | ICD-10-CM | POA: Diagnosis not present

## 2017-09-12 DIAGNOSIS — D649 Anemia, unspecified: Secondary | ICD-10-CM | POA: Diagnosis not present

## 2017-09-12 DIAGNOSIS — Z85828 Personal history of other malignant neoplasm of skin: Secondary | ICD-10-CM | POA: Diagnosis not present

## 2017-09-12 DIAGNOSIS — Z789 Other specified health status: Secondary | ICD-10-CM | POA: Diagnosis not present

## 2017-09-12 DIAGNOSIS — R351 Nocturia: Secondary | ICD-10-CM | POA: Diagnosis not present

## 2017-09-12 DIAGNOSIS — M545 Low back pain: Secondary | ICD-10-CM | POA: Diagnosis not present

## 2017-09-12 DIAGNOSIS — Z8546 Personal history of malignant neoplasm of prostate: Secondary | ICD-10-CM | POA: Diagnosis not present

## 2017-09-12 DIAGNOSIS — I4891 Unspecified atrial fibrillation: Secondary | ICD-10-CM | POA: Diagnosis not present

## 2017-09-18 DIAGNOSIS — L570 Actinic keratosis: Secondary | ICD-10-CM | POA: Diagnosis not present

## 2017-09-18 DIAGNOSIS — L858 Other specified epidermal thickening: Secondary | ICD-10-CM | POA: Diagnosis not present

## 2017-09-18 DIAGNOSIS — L281 Prurigo nodularis: Secondary | ICD-10-CM | POA: Diagnosis not present

## 2017-09-18 DIAGNOSIS — Z85828 Personal history of other malignant neoplasm of skin: Secondary | ICD-10-CM | POA: Diagnosis not present

## 2017-10-13 DIAGNOSIS — H43813 Vitreous degeneration, bilateral: Secondary | ICD-10-CM | POA: Diagnosis not present

## 2017-10-13 DIAGNOSIS — H52203 Unspecified astigmatism, bilateral: Secondary | ICD-10-CM | POA: Diagnosis not present

## 2017-10-13 DIAGNOSIS — Z961 Presence of intraocular lens: Secondary | ICD-10-CM | POA: Diagnosis not present

## 2017-10-13 DIAGNOSIS — H04123 Dry eye syndrome of bilateral lacrimal glands: Secondary | ICD-10-CM | POA: Diagnosis not present

## 2017-10-23 DIAGNOSIS — Z23 Encounter for immunization: Secondary | ICD-10-CM | POA: Diagnosis not present

## 2017-10-24 DIAGNOSIS — L570 Actinic keratosis: Secondary | ICD-10-CM | POA: Diagnosis not present

## 2017-10-24 DIAGNOSIS — Z85828 Personal history of other malignant neoplasm of skin: Secondary | ICD-10-CM | POA: Diagnosis not present

## 2017-10-24 DIAGNOSIS — D485 Neoplasm of uncertain behavior of skin: Secondary | ICD-10-CM | POA: Diagnosis not present

## 2017-10-24 DIAGNOSIS — C4442 Squamous cell carcinoma of skin of scalp and neck: Secondary | ICD-10-CM | POA: Diagnosis not present

## 2017-11-10 DIAGNOSIS — C4442 Squamous cell carcinoma of skin of scalp and neck: Secondary | ICD-10-CM | POA: Diagnosis not present

## 2017-11-10 DIAGNOSIS — Z85828 Personal history of other malignant neoplasm of skin: Secondary | ICD-10-CM | POA: Diagnosis not present

## 2017-11-14 DIAGNOSIS — C61 Malignant neoplasm of prostate: Secondary | ICD-10-CM | POA: Diagnosis not present

## 2017-11-14 DIAGNOSIS — Z789 Other specified health status: Secondary | ICD-10-CM | POA: Diagnosis not present

## 2017-11-14 DIAGNOSIS — R972 Elevated prostate specific antigen [PSA]: Secondary | ICD-10-CM | POA: Diagnosis not present

## 2017-11-14 DIAGNOSIS — R5383 Other fatigue: Secondary | ICD-10-CM | POA: Diagnosis not present

## 2017-11-14 DIAGNOSIS — Z8546 Personal history of malignant neoplasm of prostate: Secondary | ICD-10-CM | POA: Diagnosis not present

## 2017-11-14 DIAGNOSIS — Z85828 Personal history of other malignant neoplasm of skin: Secondary | ICD-10-CM | POA: Diagnosis not present

## 2017-11-14 DIAGNOSIS — Z923 Personal history of irradiation: Secondary | ICD-10-CM | POA: Diagnosis not present

## 2017-11-17 DIAGNOSIS — D649 Anemia, unspecified: Secondary | ICD-10-CM | POA: Diagnosis not present

## 2017-11-17 DIAGNOSIS — C61 Malignant neoplasm of prostate: Secondary | ICD-10-CM | POA: Diagnosis not present

## 2017-11-17 DIAGNOSIS — R972 Elevated prostate specific antigen [PSA]: Secondary | ICD-10-CM | POA: Diagnosis not present

## 2017-11-17 DIAGNOSIS — I4891 Unspecified atrial fibrillation: Secondary | ICD-10-CM | POA: Diagnosis not present

## 2017-11-17 DIAGNOSIS — R351 Nocturia: Secondary | ICD-10-CM | POA: Diagnosis not present

## 2017-11-17 DIAGNOSIS — Z789 Other specified health status: Secondary | ICD-10-CM | POA: Diagnosis not present

## 2017-11-17 DIAGNOSIS — Z8546 Personal history of malignant neoplasm of prostate: Secondary | ICD-10-CM | POA: Diagnosis not present

## 2017-11-17 DIAGNOSIS — Z79818 Long term (current) use of other agents affecting estrogen receptors and estrogen levels: Secondary | ICD-10-CM | POA: Diagnosis not present

## 2017-11-17 DIAGNOSIS — Z923 Personal history of irradiation: Secondary | ICD-10-CM | POA: Diagnosis not present

## 2017-11-17 DIAGNOSIS — Z85828 Personal history of other malignant neoplasm of skin: Secondary | ICD-10-CM | POA: Diagnosis not present

## 2017-11-17 DIAGNOSIS — M545 Low back pain: Secondary | ICD-10-CM | POA: Diagnosis not present

## 2017-11-17 DIAGNOSIS — R630 Anorexia: Secondary | ICD-10-CM | POA: Diagnosis not present

## 2017-11-17 DIAGNOSIS — N529 Male erectile dysfunction, unspecified: Secondary | ICD-10-CM | POA: Diagnosis not present

## 2017-11-17 DIAGNOSIS — R5383 Other fatigue: Secondary | ICD-10-CM | POA: Diagnosis not present

## 2017-11-18 DIAGNOSIS — Z85828 Personal history of other malignant neoplasm of skin: Secondary | ICD-10-CM | POA: Diagnosis not present

## 2017-11-18 DIAGNOSIS — L570 Actinic keratosis: Secondary | ICD-10-CM | POA: Diagnosis not present

## 2017-11-18 DIAGNOSIS — C4442 Squamous cell carcinoma of skin of scalp and neck: Secondary | ICD-10-CM | POA: Diagnosis not present

## 2017-12-03 DIAGNOSIS — Z1389 Encounter for screening for other disorder: Secondary | ICD-10-CM | POA: Diagnosis not present

## 2017-12-03 DIAGNOSIS — C7951 Secondary malignant neoplasm of bone: Secondary | ICD-10-CM | POA: Diagnosis not present

## 2017-12-03 DIAGNOSIS — D509 Iron deficiency anemia, unspecified: Secondary | ICD-10-CM | POA: Diagnosis not present

## 2017-12-03 DIAGNOSIS — I4821 Permanent atrial fibrillation: Secondary | ICD-10-CM | POA: Diagnosis not present

## 2017-12-03 DIAGNOSIS — Z0000 Encounter for general adult medical examination without abnormal findings: Secondary | ICD-10-CM | POA: Diagnosis not present

## 2017-12-03 DIAGNOSIS — N183 Chronic kidney disease, stage 3 (moderate): Secondary | ICD-10-CM | POA: Diagnosis not present

## 2017-12-03 DIAGNOSIS — E7800 Pure hypercholesterolemia, unspecified: Secondary | ICD-10-CM | POA: Diagnosis not present

## 2017-12-03 DIAGNOSIS — C61 Malignant neoplasm of prostate: Secondary | ICD-10-CM | POA: Diagnosis not present

## 2017-12-03 DIAGNOSIS — I129 Hypertensive chronic kidney disease with stage 1 through stage 4 chronic kidney disease, or unspecified chronic kidney disease: Secondary | ICD-10-CM | POA: Diagnosis not present

## 2017-12-03 DIAGNOSIS — Z789 Other specified health status: Secondary | ICD-10-CM | POA: Diagnosis not present

## 2017-12-19 DIAGNOSIS — C4442 Squamous cell carcinoma of skin of scalp and neck: Secondary | ICD-10-CM | POA: Diagnosis not present

## 2017-12-19 DIAGNOSIS — Z85828 Personal history of other malignant neoplasm of skin: Secondary | ICD-10-CM | POA: Diagnosis not present

## 2017-12-29 ENCOUNTER — Other Ambulatory Visit (HOSPITAL_COMMUNITY): Payer: Self-pay | Admitting: Internal Medicine

## 2017-12-29 DIAGNOSIS — C61 Malignant neoplasm of prostate: Secondary | ICD-10-CM

## 2018-01-05 ENCOUNTER — Ambulatory Visit: Payer: Medicare Other | Admitting: Podiatry

## 2018-01-07 ENCOUNTER — Ambulatory Visit (HOSPITAL_COMMUNITY)
Admission: RE | Admit: 2018-01-07 | Discharge: 2018-01-07 | Disposition: A | Discharge status: Home / Self Care | Payer: Medicare Other | Source: Ambulatory Visit | Attending: Internal Medicine | Admitting: Internal Medicine

## 2018-01-07 DIAGNOSIS — C61 Malignant neoplasm of prostate: Secondary | ICD-10-CM | POA: Diagnosis not present

## 2018-01-07 MED ORDER — IOPAMIDOL (ISOVUE-300) INJECTION 61%
100.0000 mL | Freq: Once | INTRAVENOUS | Status: AC | PRN
  Administered 2018-01-07: 100 mL via INTRAVENOUS

## 2018-01-07 MED ORDER — TECHNETIUM TC 99M MEDRONATE IV KIT
20.0000 | PACK | Freq: Once | INTRAVENOUS | Status: AC | PRN
  Administered 2018-01-07: 20 via INTRAVENOUS

## 2018-01-08 ENCOUNTER — Encounter: Payer: Self-pay | Admitting: Podiatry

## 2018-01-08 ENCOUNTER — Ambulatory Visit (INDEPENDENT_AMBULATORY_CARE_PROVIDER_SITE_OTHER): Payer: Medicare Other | Admitting: Podiatry

## 2018-01-08 DIAGNOSIS — M79674 Pain in right toe(s): Secondary | ICD-10-CM | POA: Diagnosis not present

## 2018-01-08 DIAGNOSIS — M79675 Pain in left toe(s): Secondary | ICD-10-CM

## 2018-01-08 DIAGNOSIS — B351 Tinea unguium: Secondary | ICD-10-CM | POA: Diagnosis not present

## 2018-01-09 NOTE — Progress Notes (Signed)
Subjective:   Patient ID: Jacob Becker, male   DOB: 84 y.o.   MRN: [id number]   HPI Patient presents with elongated thickened yellow brittle nailbeds 1-5 both feet that are painful   ROS      Objective:  Physical Exam  Neurovascular status intact with thick yellow brittle nailbeds 1-5 both feet with pain     Assessment:  Mycotic nail infection with pain 1-5 both feet     Plan:  Debride painful nailbeds 1-5 both feet with no iatrogenic bleeding noted

## 2018-01-13 DIAGNOSIS — R972 Elevated prostate specific antigen [PSA]: Secondary | ICD-10-CM | POA: Diagnosis not present

## 2018-01-13 DIAGNOSIS — Z8546 Personal history of malignant neoplasm of prostate: Secondary | ICD-10-CM | POA: Diagnosis not present

## 2018-01-13 DIAGNOSIS — C61 Malignant neoplasm of prostate: Secondary | ICD-10-CM | POA: Diagnosis not present

## 2018-01-13 DIAGNOSIS — Z789 Other specified health status: Secondary | ICD-10-CM | POA: Diagnosis not present

## 2018-01-13 DIAGNOSIS — R5383 Other fatigue: Secondary | ICD-10-CM | POA: Diagnosis not present

## 2018-01-13 DIAGNOSIS — Z85828 Personal history of other malignant neoplasm of skin: Secondary | ICD-10-CM | POA: Diagnosis not present

## 2018-01-13 DIAGNOSIS — Z923 Personal history of irradiation: Secondary | ICD-10-CM | POA: Diagnosis not present

## 2018-01-14 DIAGNOSIS — R972 Elevated prostate specific antigen [PSA]: Secondary | ICD-10-CM | POA: Diagnosis not present

## 2018-01-14 DIAGNOSIS — R5383 Other fatigue: Secondary | ICD-10-CM | POA: Diagnosis not present

## 2018-01-14 DIAGNOSIS — R351 Nocturia: Secondary | ICD-10-CM | POA: Diagnosis not present

## 2018-01-14 DIAGNOSIS — M545 Low back pain: Secondary | ICD-10-CM | POA: Diagnosis not present

## 2018-01-14 DIAGNOSIS — Z79818 Long term (current) use of other agents affecting estrogen receptors and estrogen levels: Secondary | ICD-10-CM | POA: Diagnosis not present

## 2018-01-14 DIAGNOSIS — C61 Malignant neoplasm of prostate: Secondary | ICD-10-CM | POA: Diagnosis not present

## 2018-01-14 DIAGNOSIS — I4891 Unspecified atrial fibrillation: Secondary | ICD-10-CM | POA: Diagnosis not present

## 2018-01-14 DIAGNOSIS — R630 Anorexia: Secondary | ICD-10-CM | POA: Diagnosis not present

## 2018-01-14 DIAGNOSIS — Z789 Other specified health status: Secondary | ICD-10-CM | POA: Diagnosis not present

## 2018-01-14 DIAGNOSIS — Z8546 Personal history of malignant neoplasm of prostate: Secondary | ICD-10-CM | POA: Diagnosis not present

## 2018-01-14 DIAGNOSIS — D649 Anemia, unspecified: Secondary | ICD-10-CM | POA: Diagnosis not present

## 2018-01-14 DIAGNOSIS — N529 Male erectile dysfunction, unspecified: Secondary | ICD-10-CM | POA: Diagnosis not present

## 2018-01-14 DIAGNOSIS — Z85828 Personal history of other malignant neoplasm of skin: Secondary | ICD-10-CM | POA: Diagnosis not present

## 2018-01-14 DIAGNOSIS — Z923 Personal history of irradiation: Secondary | ICD-10-CM | POA: Diagnosis not present

## 2018-01-23 DIAGNOSIS — C7951 Secondary malignant neoplasm of bone: Secondary | ICD-10-CM | POA: Diagnosis not present

## 2018-01-23 DIAGNOSIS — C61 Malignant neoplasm of prostate: Secondary | ICD-10-CM | POA: Diagnosis not present

## 2018-02-03 ENCOUNTER — Other Ambulatory Visit: Payer: Self-pay | Admitting: Interventional Cardiology

## 2018-02-25 DIAGNOSIS — C7951 Secondary malignant neoplasm of bone: Secondary | ICD-10-CM | POA: Diagnosis not present

## 2018-02-25 DIAGNOSIS — I10 Essential (primary) hypertension: Secondary | ICD-10-CM | POA: Diagnosis not present

## 2018-02-25 DIAGNOSIS — I482 Chronic atrial fibrillation, unspecified: Secondary | ICD-10-CM | POA: Diagnosis not present

## 2018-02-25 DIAGNOSIS — C61 Malignant neoplasm of prostate: Secondary | ICD-10-CM | POA: Diagnosis not present

## 2018-02-25 DIAGNOSIS — D696 Thrombocytopenia, unspecified: Secondary | ICD-10-CM | POA: Diagnosis not present

## 2018-03-05 DIAGNOSIS — Z85828 Personal history of other malignant neoplasm of skin: Secondary | ICD-10-CM | POA: Diagnosis not present

## 2018-03-05 DIAGNOSIS — L570 Actinic keratosis: Secondary | ICD-10-CM | POA: Diagnosis not present

## 2018-04-14 ENCOUNTER — Ambulatory Visit: Payer: Medicare Other | Admitting: Podiatry

## 2018-04-23 DIAGNOSIS — C61 Malignant neoplasm of prostate: Secondary | ICD-10-CM | POA: Diagnosis not present

## 2018-07-07 ENCOUNTER — Other Ambulatory Visit: Payer: Self-pay | Admitting: Internal Medicine

## 2018-07-07 ENCOUNTER — Other Ambulatory Visit (HOSPITAL_COMMUNITY): Payer: Self-pay | Admitting: Internal Medicine

## 2018-07-07 DIAGNOSIS — C61 Malignant neoplasm of prostate: Secondary | ICD-10-CM

## 2018-07-14 ENCOUNTER — Ambulatory Visit (HOSPITAL_COMMUNITY)
Admission: RE | Admit: 2018-07-14 | Discharge: 2018-07-14 | Disposition: A | Discharge status: Home / Self Care | Payer: Medicare Other | Source: Ambulatory Visit | Attending: Internal Medicine | Admitting: Internal Medicine

## 2018-07-14 ENCOUNTER — Other Ambulatory Visit: Payer: Self-pay

## 2018-07-14 DIAGNOSIS — C7951 Secondary malignant neoplasm of bone: Secondary | ICD-10-CM | POA: Diagnosis not present

## 2018-07-14 DIAGNOSIS — C61 Malignant neoplasm of prostate: Secondary | ICD-10-CM

## 2018-07-14 LAB — POCT I-STAT CREATININE: Creatinine, Ser: 1.2 mg/dL (ref 0.61–1.24)

## 2018-07-14 MED ORDER — IOPAMIDOL (ISOVUE-300) INJECTION 61%
100.0000 mL | Freq: Once | INTRAVENOUS | Status: AC | PRN
  Administered 2018-07-14: 100 mL via INTRAVENOUS

## 2018-07-14 MED ORDER — TECHNETIUM TC 99M MEDRONATE IV KIT
20.0000 | PACK | Freq: Once | INTRAVENOUS | Status: AC | PRN
  Administered 2018-07-14: 20 via INTRAVENOUS

## 2018-07-23 DIAGNOSIS — Z85828 Personal history of other malignant neoplasm of skin: Secondary | ICD-10-CM | POA: Diagnosis not present

## 2018-07-23 DIAGNOSIS — L570 Actinic keratosis: Secondary | ICD-10-CM | POA: Diagnosis not present

## 2018-07-24 ENCOUNTER — Other Ambulatory Visit: Payer: Self-pay | Admitting: Interventional Cardiology

## 2018-08-04 ENCOUNTER — Other Ambulatory Visit: Payer: Self-pay | Admitting: Interventional Cardiology

## 2018-08-05 NOTE — Progress Notes (Signed)
Cardiology Office Note:    Date:  08/07/2018   ID:  Jacob Becker, DOB Jan 04, 1930, MRN [id number]  PCP:  Lajean Manes, MD  Cardiologist:  No primary care provider on file.   Referring MD: Lajean Manes, MD   Chief Complaint  Patient presents with  . Coronary Artery Disease  . Atrial Fibrillation    History of Present Illness:    Jacob Becker is a 85 y.o. male with a hx of essential hypertension, hyperlipidemia, coronary artery disease, chronic atrial fibrillation and chronic anticoagulation therapy.  [name] is here today.  He is independent and still working but less than 4 hours/day..  No specific cardiac events have occurred since our last visit.  He has been sequestering because of the COVID-19 pandemic.  He has interrupted therapy for metastatic prostate cancer at [facility] because of the [place] pandemic.  He is now getting Lupron from Dr. Rosana Hoes at [facility].  He has had no recent blood work.  No episodes of syncope, hematemesis, melena, hematuria, edema, orthopnea, PND, chest pain, or palpitations.   Past Medical History:  Diagnosis Date  . AF (atrial fibrillation) ([place])   . Arthritis   . Cancer [facility])    prostate  . Dysrhythmia    af  . Hyperlipemia   . Hypertension   . Metastatic bone cancer (HCC)    from prostate  . Wears glasses     Past Surgical History:  Procedure Laterality Date  . CHEILECTOMY Right 10/07/2013   Procedure: RIGHT HALLUX CHEILECTOMY;  Surgeon: [name], MD;  Location: [place];  Service: Orthopedics;  Laterality: Right;  . COLONOSCOPY    . CYSTOSCOPY PROSTATE W/ LASER  2012   turp  . HERNIA REPAIR     rt and lt ing  . KNEE ARTHROSCOPY  2005   right  . TONSILLECTOMY      Current Medications: Current Meds  Medication Sig  . amLODipine (NORVASC) 5 MG tablet Take 1 tablet (5 mg total) by mouth daily.  . Calcium-Magnesium-Vitamin D (CALCIUM 500 PO) Take 500 mg by mouth 2 (two) times daily.   . Coenzyme Q10 (CO Q 10) 100 MG CAPS Take 100 mg by mouth daily.  . colesevelam (WELCHOL) 625 MG tablet Take 625 mg by mouth 2 (two) times daily with a meal.  . enzalutamide (XTANDI) 40 MG capsule Take 160 mg by mouth daily.  . ferrous sulfate 325 (65 FE) MG tablet Take 1 tablet (325 mg total) by mouth daily with breakfast.  . metoprolol succinate (TOPROL-XL) 100 MG 24 hr tablet TAKE ONE TABLET TWICE DAILY  . Multiple Vitamin (MULTIVITAMIN) capsule Take 1 capsule by mouth daily.  . nitroGLYCERIN (NITROSTAT) 0.4 MG SL tablet ONE TABLET UNDER TONGUE EVERY 5 MINUTES AS NEEDED FOR CHEST PAIN  . Omega-3 Fatty Acids (FISH OIL PO) Take 1,000 mg by mouth daily.  . ramipril (ALTACE) 10 MG capsule Take 1 capsule (10 mg total) by mouth 2 (two) times daily.  . rosuvastatin (CRESTOR) 40 MG tablet Take 40 mg by mouth daily.  Marland Kitchen triamterene-hydrochlorothiazide (DYAZIDE) 37.5-25 MG capsule TAKE TWO CAPSULES EVERY DAY  . XARELTO 20 MG TABS tablet TAKE ONE TABLET EACH DAY     Allergies:   Patient has no known allergies.   Social History   Socioeconomic History  . Marital status: Married    Spouse name: Not on file  . Number of children: Not on file  . Years of education: Not on file  .  Highest education level: Not on file  Occupational History  . Not on file  Social Needs  . Financial resource strain: Not on file  . Food insecurity    Worry: Not on file    Inability: Not on file  . Transportation needs    Medical: Not on file    Non-medical: Not on file  Tobacco Use  . Smoking status: Former Smoker    Quit date: 10/02/1971    Years since quitting: 46.8  . Smokeless tobacco: Never Used  Substance and Sexual Activity  . Alcohol use: Yes    Comment: daily  . Drug use: No  . Sexual activity: Not on file  Lifestyle  . Physical activity    Days per week: Not on file    Minutes per session: Not on file  . Stress: Not on file  Relationships  . Social Herbalist on phone: Not on file     Gets together: Not on file    Attends religious service: Not on file    Active member of club or organization: Not on file    Attends meetings of clubs or organizations: Not on file    Relationship status: Not on file  Other Topics Concern  . Not on file  Social History Narrative  . Not on file     Family History: The patient's family history includes Heart failure in his father.  ROS:   Please see the history of present illness.    No flecks of blood in his urine after he self does because of obstruction from prostate cancer.  Appetite is not as good and he has lost some weight.  All other systems reviewed and are negative.  EKGs/Labs/Other Studies Reviewed:    The following studies were reviewed today: New or recent laboratory data in the: Health system.  Last lipid panel was from November 2018.  EKG:  EKG atrial fibrillation with controlled ventricular response.  Nonspecific T wave flattening.  Incomplete right bundle branch block.  When compared to prior from May 2019, no changes occurred.  Recent Labs: 07/14/2018: Creatinine, Ser 1.20  Recent Lipid Panel    Component Value Date/Time   CHOL 162 09/03/2013 0749   TRIG 87.0 09/03/2013 0749   HDL 56.10 09/03/2013 0749   CHOLHDL 3 09/03/2013 0749   VLDL 17.4 09/03/2013 0749   LDLCALC 89 09/03/2013 0749    Physical Exam:    VS:  Ht 6\' 2"  (1.88 m)   BMI 23.62 kg/m     Wt Readings from Last 3 Encounters:  06/24/17 184 lb (83.5 kg)  12/25/15 190 lb 3.2 oz (86.3 kg)  07/08/14 199 lb (90.3 kg)     GEN: Elderly and starting to appear somewhat frail.. No acute distress HEENT: Normal NECK: No JVD. LYMPHATICS: No lymphadenopathy CARDIAC: Irregularly irregular RR with soft systolic murmur, and no gallop, or edema. VASCULAR:  Normal Pulses. No bruits. RESPIRATORY:  Clear to auscultation without rales, wheezing or rhonchi  ABDOMEN: Soft, non-tender, non-distended, No pulsatile mass, MUSCULOSKELETAL: No deformity  SKIN:  Warm and dry NEUROLOGIC:  Alert and oriented x 3 PSYCHIATRIC:  Normal affect   ASSESSMENT:    1. Coronary artery disease involving native coronary artery of native heart without angina pectoris   2. Chronic atrial fibrillation   3. Chronic anticoagulation   4. Other hyperlipidemia   5. Essential hypertension   6. Prostate cancer [facility])    PLAN:    In order of problems listed  above:  1. Secondary prevention discussed.  2. Present and with good rate control. 3. Continue Xarelto at 20 mg/day.  Reassess dose once kidney function is known. 4. LDL last evaluated was 90.  He needs a lipid panel and liver panel.  The most recent value that I have available is from November 2018. 5. New target 140/80 mmHg.  Overall education and awareness concerning primary/secondary risk prevention was discussed in detail: LDL less than 70, hemoglobin A1c less than 7, blood pressure target less than 130/80 mmHg, >150 minutes of moderate aerobic activity per week becomes less appropriate at his age where he needs to take care to avoid musculoskeletal injury and falls, avoidance of smoking, weight control (via diet and exercise), and continued surveillance/management of/for obstructive sleep apnea.    Medication Adjustments/Labs and Tests Ordered: Current medicines are reviewed at length with the patient today.  Concerns regarding medicines are outlined above.  Orders Placed This Encounter  Procedures  . EKG 12-Lead   No orders of the defined types were placed in this encounter.   There are no Patient Instructions on file for this visit.   Signed, Sinclair Grooms, MD  08/07/2018 8:13 AM    [place]

## 2018-08-06 ENCOUNTER — Telehealth: Payer: Self-pay | Admitting: Interventional Cardiology

## 2018-08-06 NOTE — Telephone Encounter (Signed)

## 2018-08-07 ENCOUNTER — Other Ambulatory Visit: Payer: Self-pay

## 2018-08-07 ENCOUNTER — Encounter: Payer: Self-pay | Admitting: Interventional Cardiology

## 2018-08-07 ENCOUNTER — Ambulatory Visit (INDEPENDENT_AMBULATORY_CARE_PROVIDER_SITE_OTHER): Payer: Medicare Other | Admitting: Interventional Cardiology

## 2018-08-07 VITALS — BP 142/86 | HR 80 | Ht 74.0 in | Wt 183.0 lb

## 2018-08-07 DIAGNOSIS — C61 Malignant neoplasm of prostate: Secondary | ICD-10-CM | POA: Diagnosis not present

## 2018-08-07 DIAGNOSIS — Z7901 Long term (current) use of anticoagulants: Secondary | ICD-10-CM

## 2018-08-07 DIAGNOSIS — E7849 Other hyperlipidemia: Secondary | ICD-10-CM

## 2018-08-07 DIAGNOSIS — I482 Chronic atrial fibrillation, unspecified: Secondary | ICD-10-CM | POA: Diagnosis not present

## 2018-08-07 DIAGNOSIS — I10 Essential (primary) hypertension: Secondary | ICD-10-CM

## 2018-08-07 DIAGNOSIS — I2510 Atherosclerotic heart disease of native coronary artery without angina pectoris: Secondary | ICD-10-CM | POA: Diagnosis not present

## 2018-08-07 MED ORDER — TRIAMTERENE-HCTZ 37.5-25 MG PO CAPS: 1.0000 | ORAL_CAPSULE | Freq: Every day | ORAL | 3 refills | Status: DC

## 2018-08-07 NOTE — Patient Instructions (Signed)
Medication Instructions:  Your physician recommends that you continue on your current medications as directed. Please refer to the Current Medication list given to you today.  If you need a refill on your cardiac medications before your next appointment, please call your pharmacy.   Lab work: Liver, Lipid, and BMET when you return for labs.  If you have labs (blood work) drawn today and your tests are completely normal, you will receive your results only by: Marland Kitchen MyChart Message (if you have MyChart) OR . A paper copy in the mail If you have any lab test that is abnormal or we need to change your treatment, we will call you to review the results.  Testing/Procedures: None  Follow-Up: At [facility] you and your health needs are our priority.  As part of our continuing mission to provide you with exceptional heart care, we have created designated Provider Care Teams.  These Care Teams include your primary Cardiologist (physician) and Advanced Practice Providers (APPs -  Physician Assistants and Nurse Practitioners) who all work together to provide you with the care you need, when you need it. You will need a follow up appointment in 12 months.  Please call our office 2 months in advance to schedule this appointment.  You may see Dr. Tamala Julian or one of the following Advanced Practice Providers on your designated Care Team:   Truitt Merle, NP Cecilie Kicks, NP . Kathyrn Drown, NP  Any Other Special Instructions Will Be Listed Below (If Applicable).  Please call us and let us know which labs are needed from your other physician and we will get your lab appointment set up.

## 2018-08-10 ENCOUNTER — Other Ambulatory Visit: Payer: Self-pay | Admitting: Interventional Cardiology

## 2018-09-01 DIAGNOSIS — C61 Malignant neoplasm of prostate: Secondary | ICD-10-CM | POA: Diagnosis not present

## 2018-09-08 ENCOUNTER — Other Ambulatory Visit: Payer: Self-pay

## 2018-09-08 ENCOUNTER — Ambulatory Visit (INDEPENDENT_AMBULATORY_CARE_PROVIDER_SITE_OTHER): Payer: Medicare Other | Admitting: Podiatry

## 2018-09-08 ENCOUNTER — Encounter: Payer: Self-pay | Admitting: Podiatry

## 2018-09-08 VITALS — Temp 97.5°F

## 2018-09-08 DIAGNOSIS — M79674 Pain in right toe(s): Secondary | ICD-10-CM

## 2018-09-08 DIAGNOSIS — B351 Tinea unguium: Secondary | ICD-10-CM

## 2018-09-08 DIAGNOSIS — M79675 Pain in left toe(s): Secondary | ICD-10-CM

## 2018-09-08 NOTE — Patient Instructions (Signed)

## 2018-09-15 NOTE — Progress Notes (Signed)
Subjective:  Jacob Becker presents to clinic today with cc of  painful, thick, discolored, elongated toenails 1-5 b/l that become tender and cannot cut because of thickness.  Pain is aggravated when wearing enclosed shoe gear.  He is interested in discussing treatment options for his toenails stating he would like to go to the beach and have his feet out, but feels conscious because of appearance of toenails.  Lajean Manes, MD    No Known Allergies   Objective: Vitals:   09/08/18 0809  Temp: (!) 97.5 F (36.4 C)    Physical Examination:  Vascular Examination: Capillary refill time immediate x 10 digits.  Palpable DP/PT pulses b/l.  Digital hair sparse b/l.  No edema noted b/l.  Skin temperature gradient WNL b/l.  Dermatological Examination: Skin with normal turgor, texture and tone b/l.  No open wounds b/l.  No interdigital macerations noted b/l.  Elongated, thick, discolored brittle toenails with subungual debris and pain on dorsal palpation of nailbeds 1-5 b/l.  Musculoskeletal Examination: Muscle strength 5/5 to all muscle groups b/l  No pain, crepitus or joint discomfort with active/passive ROM.  Neurological Examination: Sensation intact 5/5 b/l with 10 gram monofilament.  Vibratory sensation intact b/l.  Proprioceptive sensation intact b/l.  Assessment: Mycotic nail infection with pain 1-5 b/l  Plan: 1. Toenails 1-5 b/l were debrided in length and girth without iatrogenic laceration.  We discussed topical treatment options, oral treatment options and laser therapy. I do not recommend oral therapy given his age and statin use. We will try combination topical antifungal and laser therapy. Rx written for nonformulary compounding topical antifungal: [facility]: Antifungal cream - Terbinafine 3%, Fluconazole 2%, Tea Tree Oil 5%, Urea 10%, Ibuprofen 2% in DMSO Suspension #39ml. Apply to the affected nail(s) at bedtime.  He will schedule laser  therapy sessions with our RN per his convenience. I emphasized he must do 5 consecutive monthly visits to see best results. I explained it may take up to a year to see significant improvement. He related understanding. 2.  Continue soft, supportive shoe gear daily. 3.  Report any pedal injuries to medical professional. 4.  Follow up 3 months. 5.  Patient/POA to call should there be a question/concern in there interim.

## 2018-10-07 DIAGNOSIS — Z23 Encounter for immunization: Secondary | ICD-10-CM | POA: Diagnosis not present

## 2018-10-08 NOTE — Addendum Note (Signed)
Addended by: Loren Racer on: 10/08/2018 03:47 PM   Modules accepted: Orders

## 2018-10-19 DIAGNOSIS — H52203 Unspecified astigmatism, bilateral: Secondary | ICD-10-CM | POA: Diagnosis not present

## 2018-10-19 DIAGNOSIS — H43813 Vitreous degeneration, bilateral: Secondary | ICD-10-CM | POA: Diagnosis not present

## 2018-10-19 DIAGNOSIS — H26493 Other secondary cataract, bilateral: Secondary | ICD-10-CM | POA: Diagnosis not present

## 2018-10-19 DIAGNOSIS — H35363 Drusen (degenerative) of macula, bilateral: Secondary | ICD-10-CM | POA: Diagnosis not present

## 2018-10-23 ENCOUNTER — Other Ambulatory Visit: Payer: Self-pay | Admitting: Interventional Cardiology

## 2018-10-29 ENCOUNTER — Telehealth: Payer: Self-pay | Admitting: Interventional Cardiology

## 2018-10-29 NOTE — Telephone Encounter (Signed)
Been trying to contact pt since appt in July to set him up for fasting labs.  Needed Lipid, Liver and BMET.  Pt was to find out what labs he needs for prostate and call us.  Finally reached pt and he had prostate labs and CMP at [facility].  He asked that I call him back on Monday afternoon to schedule labs for cholesterol as he is out of town right now.

## 2018-11-04 ENCOUNTER — Other Ambulatory Visit: Payer: Medicare Other

## 2018-11-04 NOTE — Telephone Encounter (Signed)
Spoke with pt yesterday and scheduled labs for 10/7

## 2018-11-05 ENCOUNTER — Other Ambulatory Visit: Payer: Self-pay

## 2018-11-05 ENCOUNTER — Other Ambulatory Visit: Payer: Medicare Other | Admitting: *Deleted

## 2018-11-05 ENCOUNTER — Encounter (INDEPENDENT_AMBULATORY_CARE_PROVIDER_SITE_OTHER): Payer: Self-pay

## 2018-11-05 DIAGNOSIS — I2510 Atherosclerotic heart disease of native coronary artery without angina pectoris: Secondary | ICD-10-CM | POA: Diagnosis not present

## 2018-11-05 DIAGNOSIS — I10 Essential (primary) hypertension: Secondary | ICD-10-CM | POA: Diagnosis not present

## 2018-11-05 DIAGNOSIS — E7849 Other hyperlipidemia: Secondary | ICD-10-CM

## 2018-11-05 LAB — HEPATIC FUNCTION PANEL
ALT: 10 IU/L (ref 0–44)
AST: 20 IU/L (ref 0–40)
Albumin: 4.1 g/dL (ref 3.6–4.6)
Alkaline Phosphatase: 65 IU/L (ref 39–117)
Bilirubin Total: 0.8 mg/dL (ref 0.0–1.2)
Bilirubin, Direct: 0.25 mg/dL (ref 0.00–0.40)
Total Protein: 7.1 g/dL (ref 6.0–8.5)

## 2018-11-05 LAB — BASIC METABOLIC PANEL
BUN/Creatinine Ratio: 25 — ABNORMAL HIGH (ref 10–24)
BUN: 29 mg/dL — ABNORMAL HIGH (ref 8–27)
CO2: 23 mmol/L (ref 20–29)
Calcium: 9.6 mg/dL (ref 8.6–10.2)
Chloride: 102 mmol/L (ref 96–106)
Creatinine, Ser: 1.15 mg/dL (ref 0.76–1.27)
GFR calc Af Amer: 66 mL/min/{1.73_m2} (ref >59)
GFR calc non Af Amer: 57 mL/min/{1.73_m2} — ABNORMAL LOW (ref >59)
Glucose: 118 mg/dL — ABNORMAL HIGH (ref 65–99)
Potassium: 4.7 mmol/L (ref 3.5–5.2)
Sodium: 138 mmol/L (ref 134–144)

## 2018-11-05 LAB — LIPID PANEL
Chol/HDL Ratio: 2.4 ratio (ref 0.0–5.0)
Cholesterol, Total: 152 mg/dL (ref 100–199)
HDL: 63 mg/dL (ref >39)
LDL Chol Calc (NIH): 75 mg/dL (ref 0–99)
Triglycerides: 72 mg/dL (ref 0–149)
VLDL Cholesterol Cal: 14 mg/dL (ref 5–40)

## 2018-11-06 ENCOUNTER — Telehealth: Payer: Self-pay | Admitting: *Deleted

## 2018-11-06 NOTE — Telephone Encounter (Signed)
-----   Message from Belva Crome, MD sent at 11/05/2018  8:52 PM EDT ----- Let the patient know the labs are okay. A copy will be sent to Lajean Manes, MD

## 2018-11-06 NOTE — Telephone Encounter (Signed)
Pt has been notified of lab results by phone with verbal understanding. Pt wanted me to let Dr. Tamala Julian know that he said thank you to Dr. Tamala Julian for taking great care of him. I assured the pt I will pass message along. I will forward results to PCP Dr. Lajean Manes. Pt thanked me for the call. The patient has been notified of the result and verbalized understanding.  All questions (if any) were answered. Julaine Hua, CMA 11/06/2018 11:19 AM

## 2018-11-17 DIAGNOSIS — D485 Neoplasm of uncertain behavior of skin: Secondary | ICD-10-CM | POA: Diagnosis not present

## 2018-11-17 DIAGNOSIS — Z85828 Personal history of other malignant neoplasm of skin: Secondary | ICD-10-CM | POA: Diagnosis not present

## 2018-11-17 DIAGNOSIS — D0462 Carcinoma in situ of skin of left upper limb, including shoulder: Secondary | ICD-10-CM | POA: Diagnosis not present

## 2018-11-17 DIAGNOSIS — L570 Actinic keratosis: Secondary | ICD-10-CM | POA: Diagnosis not present

## 2018-11-17 DIAGNOSIS — C4442 Squamous cell carcinoma of skin of scalp and neck: Secondary | ICD-10-CM | POA: Diagnosis not present

## 2018-12-08 DIAGNOSIS — Z85828 Personal history of other malignant neoplasm of skin: Secondary | ICD-10-CM | POA: Diagnosis not present

## 2018-12-08 DIAGNOSIS — L570 Actinic keratosis: Secondary | ICD-10-CM | POA: Diagnosis not present

## 2018-12-11 ENCOUNTER — Other Ambulatory Visit: Payer: Self-pay | Admitting: Interventional Cardiology

## 2018-12-14 ENCOUNTER — Ambulatory Visit: Payer: Medicare Other | Admitting: Podiatry

## 2019-01-28 DIAGNOSIS — C7951 Secondary malignant neoplasm of bone: Secondary | ICD-10-CM | POA: Diagnosis not present

## 2019-01-28 DIAGNOSIS — Z23 Encounter for immunization: Secondary | ICD-10-CM | POA: Diagnosis not present

## 2019-01-28 DIAGNOSIS — I10 Essential (primary) hypertension: Secondary | ICD-10-CM | POA: Diagnosis not present

## 2019-01-28 DIAGNOSIS — Z1389 Encounter for screening for other disorder: Secondary | ICD-10-CM | POA: Diagnosis not present

## 2019-01-28 DIAGNOSIS — E7800 Pure hypercholesterolemia, unspecified: Secondary | ICD-10-CM | POA: Diagnosis not present

## 2019-01-28 DIAGNOSIS — Z79899 Other long term (current) drug therapy: Secondary | ICD-10-CM | POA: Diagnosis not present

## 2019-01-28 DIAGNOSIS — D509 Iron deficiency anemia, unspecified: Secondary | ICD-10-CM | POA: Diagnosis not present

## 2019-01-28 DIAGNOSIS — C61 Malignant neoplasm of prostate: Secondary | ICD-10-CM | POA: Diagnosis not present

## 2019-01-28 DIAGNOSIS — Z0000 Encounter for general adult medical examination without abnormal findings: Secondary | ICD-10-CM | POA: Diagnosis not present

## 2019-01-28 DIAGNOSIS — F439 Reaction to severe stress, unspecified: Secondary | ICD-10-CM | POA: Diagnosis not present

## 2019-01-28 DIAGNOSIS — D696 Thrombocytopenia, unspecified: Secondary | ICD-10-CM | POA: Diagnosis not present

## 2019-01-28 DIAGNOSIS — I482 Chronic atrial fibrillation, unspecified: Secondary | ICD-10-CM | POA: Diagnosis not present

## 2019-02-16 DIAGNOSIS — D044 Carcinoma in situ of skin of scalp and neck: Secondary | ICD-10-CM | POA: Diagnosis not present

## 2019-02-16 DIAGNOSIS — L570 Actinic keratosis: Secondary | ICD-10-CM | POA: Diagnosis not present

## 2019-02-16 DIAGNOSIS — Z85828 Personal history of other malignant neoplasm of skin: Secondary | ICD-10-CM | POA: Diagnosis not present

## 2019-02-16 DIAGNOSIS — D0462 Carcinoma in situ of skin of left upper limb, including shoulder: Secondary | ICD-10-CM | POA: Diagnosis not present

## 2019-02-16 DIAGNOSIS — D485 Neoplasm of uncertain behavior of skin: Secondary | ICD-10-CM | POA: Diagnosis not present

## 2019-02-16 DIAGNOSIS — C44629 Squamous cell carcinoma of skin of left upper limb, including shoulder: Secondary | ICD-10-CM | POA: Diagnosis not present

## 2019-02-18 ENCOUNTER — Other Ambulatory Visit: Payer: Self-pay | Admitting: Internal Medicine

## 2019-02-18 DIAGNOSIS — C61 Malignant neoplasm of prostate: Secondary | ICD-10-CM

## 2019-02-25 ENCOUNTER — Ambulatory Visit (HOSPITAL_COMMUNITY)
Admission: RE | Admit: 2019-02-25 | Discharge: 2019-02-25 | Disposition: A | Discharge status: Home / Self Care | Payer: Medicare Other | Source: Ambulatory Visit | Attending: Internal Medicine | Admitting: Internal Medicine

## 2019-02-25 ENCOUNTER — Other Ambulatory Visit: Payer: Self-pay

## 2019-02-25 DIAGNOSIS — C61 Malignant neoplasm of prostate: Secondary | ICD-10-CM | POA: Diagnosis not present

## 2019-02-25 DIAGNOSIS — C7951 Secondary malignant neoplasm of bone: Secondary | ICD-10-CM | POA: Diagnosis not present

## 2019-02-25 MED ORDER — TECHNETIUM TC 99M MEDRONATE IV KIT
20.0000 | PACK | Freq: Once | INTRAVENOUS | Status: AC | PRN
  Administered 2019-02-25: 20 via INTRAVENOUS

## 2019-02-25 MED ORDER — IOHEXOL 300 MG/ML  SOLN
100.0000 mL | Freq: Once | INTRAMUSCULAR | Status: AC | PRN
  Administered 2019-02-25: 100 mL via INTRAVENOUS

## 2019-02-26 ENCOUNTER — Ambulatory Visit: Payer: Medicare Other

## 2019-03-06 ENCOUNTER — Ambulatory Visit: Payer: Medicare Other | Attending: Internal Medicine

## 2019-03-06 DIAGNOSIS — Z23 Encounter for immunization: Secondary | ICD-10-CM | POA: Insufficient documentation

## 2019-03-06 NOTE — Progress Notes (Signed)
   Covid-19 Vaccination Clinic  Name:  Jacob Becker    MRN: [id number] DOB: 07/03/1929  03/06/2019  [name] was observed post Covid-19 immunization for 15 minutes without incidence. He was provided with Vaccine Information Sheet and instruction to access the V-Safe system.   [name] was instructed to call 911 with any severe reactions post vaccine: Marland Kitchen Difficulty breathing  . Swelling of your face and throat  . A fast heartbeat  . A bad rash all over your body  . Dizziness and weakness    Immunizations Administered    Name Date Dose VIS Date Route   Pfizer COVID-19 Vaccine 03/06/2019  3:12 PM 0.3 mL 01/08/2019 Intramuscular   Manufacturer: [place]   Lot: [id number]   [place]: [id number]

## 2019-03-30 DIAGNOSIS — D044 Carcinoma in situ of skin of scalp and neck: Secondary | ICD-10-CM | POA: Diagnosis not present

## 2019-03-30 DIAGNOSIS — Z85828 Personal history of other malignant neoplasm of skin: Secondary | ICD-10-CM | POA: Diagnosis not present

## 2019-03-31 ENCOUNTER — Ambulatory Visit: Payer: Medicare Other | Attending: Internal Medicine

## 2019-03-31 DIAGNOSIS — Z23 Encounter for immunization: Secondary | ICD-10-CM

## 2019-03-31 NOTE — Progress Notes (Signed)
   Covid-19 Vaccination Clinic  Name:  Jacob Becker    MRN: [id number] DOB: 10/30/1929  03/31/2019  [name] was observed post Covid-19 immunization for 15 minutes without incident. He was provided with Vaccine Information Sheet and instruction to access the V-Safe system.   [name] was instructed to call 911 with any severe reactions post vaccine: Marland Kitchen Difficulty breathing  . Swelling of face and throat  . A fast heartbeat  . A bad rash all over body  . Dizziness and weakness   Immunizations Administered    Name Date Dose VIS Date Route   Pfizer COVID-19 Vaccine 03/31/2019 10:52 AM 0.3 mL 01/08/2019 Intramuscular   Manufacturer: [place]   Lot: [id number]   [place]: [id number]

## 2019-04-16 ENCOUNTER — Encounter: Payer: Self-pay | Admitting: Podiatry

## 2019-04-16 ENCOUNTER — Ambulatory Visit (INDEPENDENT_AMBULATORY_CARE_PROVIDER_SITE_OTHER): Payer: Medicare Other | Admitting: Podiatry

## 2019-04-16 ENCOUNTER — Other Ambulatory Visit: Payer: Self-pay

## 2019-04-16 VITALS — Temp 96.2°F

## 2019-04-16 DIAGNOSIS — B351 Tinea unguium: Secondary | ICD-10-CM | POA: Diagnosis not present

## 2019-04-16 DIAGNOSIS — M79674 Pain in right toe(s): Secondary | ICD-10-CM | POA: Diagnosis not present

## 2019-04-16 DIAGNOSIS — M79675 Pain in left toe(s): Secondary | ICD-10-CM | POA: Diagnosis not present

## 2019-04-16 NOTE — Patient Instructions (Addendum)
[address] Patient Line [place], [place]): [phone] I have prescribed a topical antifungal drops for your toenails. It will be mailed to your home. If you do not hear from them in one week regarding toenail solution prescription, please give them a call.  Moisturize feet once daily; do not apply between toes: 1. Vaseline Intensive Care Lotion 2. Lubriderm Lotion 3. Gold Bond Diabetic Foot Lotion 4. Eucerin Intensive Repair Moisturizing Lotion  If you have problems reaching your feet: 1. Aquaphor Advanced Therapy Ointment Body Spray 2. Vaseline Intensive Care Spray Lotion Advanced Repair   Onychomycosis/Fungal Toenails  WHAT IS IT? An infection that lies within the keratin of your nail plate that is caused by a fungus.  WHY ME? Fungal infections affect all ages, sexes, races, and creeds.  There may be many factors that predispose you to a fungal infection such as age, coexisting medical conditions such as diabetes, or an autoimmune disease; stress, medications, fatigue, genetics, etc.  Bottom line: fungus thrives in a warm, moist environment and your shoes offer such a location.  IS IT CONTAGIOUS? Theoretically, yes.  You do not want to share shoes, nail clippers or files with someone who has fungal toenails.  Walking around barefoot in the same room or sleeping in the same bed is unlikely to transfer the organism.  It is important to realize, however, that fungus can spread easily from one nail to the next on the same foot.  HOW DO WE TREAT THIS?  There are several ways to treat this condition.  Treatment may depend on many factors such as age, medications, pregnancy, liver and kidney conditions, etc.  It is best to ask your doctor which options are available to you.  1. No treatment.   Unlike many other medical concerns, you can live with this condition.  However for many people this can be a painful condition and may lead to ingrown toenails or a bacterial infection.  It is  recommended that you keep the nails cut short to help reduce the amount of fungal nail. 2. Topical treatment.  These range from herbal remedies to prescription strength nail lacquers.  About 40-50% effective, topicals require twice daily application for approximately 9 to 12 months or until an entirely new nail has grown out.  The most effective topicals are medical grade medications available through physicians offices. 3. Oral antifungal medications.  With an 80-90% cure rate, the most common oral medication requires 3 to 4 months of therapy and stays in your system for a year as the new nail grows out.  Oral antifungal medications do require blood work to make sure it is a safe drug for you.  A liver function panel will be performed prior to starting the medication and after the first month of treatment.  It is important to have the blood work performed to avoid any harmful side effects.  In general, this medication safe but blood work is required. 4. Laser Therapy.  This treatment is performed by applying a specialized laser to the affected nail plate.  This therapy is noninvasive, fast, and non-painful.  It is not covered by insurance and is therefore, out of pocket.  The results have been very good with a 80-95% cure rate.  The [facility] is the only practice in the area to offer this therapy. 5. Permanent Nail Avulsion.  Removing the entire nail so that a new nail will not grow back.

## 2019-04-20 DIAGNOSIS — C61 Malignant neoplasm of prostate: Secondary | ICD-10-CM | POA: Diagnosis not present

## 2019-04-22 NOTE — Progress Notes (Signed)
Subjective: Jacob Becker presents today for follow up of painful mycotic nails b/l that are difficult to trim. Pain interferes with ambulation. Aggravating factors include wearing enclosed shoe gear. Pain is relieved with periodic professional debridement.   He would like to discuss treatments available for his fungal toenails.  No Known Allergies   Objective: Vitals:   04/16/19 0852  Temp: (!) 96.2 F (35.7 C)    Pt 86 y.o. year old Caucasian male WD, WN in NAD. AAO x 3.   Vascular Examination:  Capillary refill time to digits immediate b/l. Palpable DP pulses b/l. Palpable PT pulses b/l. Pedal hair sparse b/l. Skin temperature gradient within normal limits b/l.  Dermatological Examination: Pedal skin with normal turgor, texture and tone bilaterally. No open wounds bilaterally. No interdigital macerations bilaterally. Toenails 1-5 b/l elongated, dystrophic, thickened, crumbly with subungual debris and tenderness to dorsal palpation.  Musculoskeletal: Normal muscle strength 5/5 to all lower extremity muscle groups bilaterally, no gross bony deformities bilaterally and no pain crepitus or joint limitation noted with ROM b/l  Neurological: Protective sensation intact 5/5 intact bilaterally with 10g monofilament b/l Vibratory sensation intact b/l  Assessment: 1. Pain due to onychomycosis of toenails of both feet    Plan: -Discussed topical, laser and oral medication. Patient opted for topical treatment with compounded medication. Rx written for nonformulary compounding topical antifungal: [facility]: Antifungal cream - Terbinafine 3%, Fluconazole 2%, Tea Tree Oil 5%, Urea 10%, Ibuprofen 2% in DMSO Suspension #51ml. Apply to the affected nail(s) at bedtime. -Toenails 1-5 b/l were debrided in length and girth with sterile nail nippers and dremel without iatrogenic bleeding.  -Patient to continue soft, supportive shoe gear daily. -Patient to report any pedal injuries to  medical professional immediately. -Patient/POA to call should there be question/concern in the interim.  Return in about 3 months (around 07/17/2019).

## 2019-05-12 DIAGNOSIS — Z85828 Personal history of other malignant neoplasm of skin: Secondary | ICD-10-CM | POA: Diagnosis not present

## 2019-05-12 DIAGNOSIS — L570 Actinic keratosis: Secondary | ICD-10-CM | POA: Diagnosis not present

## 2019-07-13 DIAGNOSIS — Z23 Encounter for immunization: Secondary | ICD-10-CM | POA: Diagnosis not present

## 2019-07-13 DIAGNOSIS — I482 Chronic atrial fibrillation, unspecified: Secondary | ICD-10-CM | POA: Diagnosis not present

## 2019-07-13 DIAGNOSIS — Z79899 Other long term (current) drug therapy: Secondary | ICD-10-CM | POA: Diagnosis not present

## 2019-07-13 DIAGNOSIS — C7951 Secondary malignant neoplasm of bone: Secondary | ICD-10-CM | POA: Diagnosis not present

## 2019-07-13 DIAGNOSIS — E7800 Pure hypercholesterolemia, unspecified: Secondary | ICD-10-CM | POA: Diagnosis not present

## 2019-07-13 DIAGNOSIS — C61 Malignant neoplasm of prostate: Secondary | ICD-10-CM | POA: Diagnosis not present

## 2019-07-13 DIAGNOSIS — I10 Essential (primary) hypertension: Secondary | ICD-10-CM | POA: Diagnosis not present

## 2019-07-14 ENCOUNTER — Other Ambulatory Visit: Payer: Self-pay

## 2019-07-14 ENCOUNTER — Ambulatory Visit (INDEPENDENT_AMBULATORY_CARE_PROVIDER_SITE_OTHER): Payer: Medicare Other | Admitting: Podiatry

## 2019-07-14 ENCOUNTER — Other Ambulatory Visit: Payer: Self-pay | Admitting: Interventional Cardiology

## 2019-07-14 ENCOUNTER — Encounter: Payer: Self-pay | Admitting: Podiatry

## 2019-07-14 DIAGNOSIS — M79675 Pain in left toe(s): Secondary | ICD-10-CM

## 2019-07-14 DIAGNOSIS — B351 Tinea unguium: Secondary | ICD-10-CM | POA: Diagnosis not present

## 2019-07-14 DIAGNOSIS — M2142 Flat foot [pes planus] (acquired), left foot: Secondary | ICD-10-CM

## 2019-07-14 DIAGNOSIS — M79674 Pain in right toe(s): Secondary | ICD-10-CM

## 2019-07-14 DIAGNOSIS — M2041 Other hammer toe(s) (acquired), right foot: Secondary | ICD-10-CM

## 2019-07-14 DIAGNOSIS — M2141 Flat foot [pes planus] (acquired), right foot: Secondary | ICD-10-CM

## 2019-07-14 DIAGNOSIS — M2042 Other hammer toe(s) (acquired), left foot: Secondary | ICD-10-CM

## 2019-07-15 ENCOUNTER — Other Ambulatory Visit: Payer: Self-pay

## 2019-07-15 MED ORDER — TRIAMTERENE-HCTZ 37.5-25 MG PO CAPS: 1.0000 | ORAL_CAPSULE | Freq: Every day | ORAL | 0 refills | Status: DC

## 2019-07-16 ENCOUNTER — Other Ambulatory Visit: Payer: Self-pay | Admitting: Interventional Cardiology

## 2019-07-22 NOTE — Progress Notes (Signed)
Subjective: Jacob Becker is a pleasant 86 y.o. male patient seen today painful mycotic nails b/l that are difficult to trim. Pain interferes with ambulation. Aggravating factors include wearing enclosed shoe gear. Pain is relieved with periodic professional debridement.  Past Medical History:  Diagnosis Date  . AF (atrial fibrillation) ([place])   . Arthritis   . Cancer [facility])    prostate  . Dysrhythmia    af  . Hyperlipemia   . Hypertension   . Metastatic bone cancer (HCC)    from prostate  . Wears glasses     Patient Active Problem List   Diagnosis Date Noted  . Anemia, iron deficiency 06/24/2017  . AF (atrial fibrillation) ([place]) 07/07/2014  . Coronary artery disease involving native heart 07/07/2014  . Essential hypertension 07/07/2014  . Chronic anticoagulation 07/07/2014  . Hyperlipidemia 07/07/2014  . Prostate cancer ([place]) 07/07/2014  . Neurogenic bladder disorder 10/28/2011  . Post-traumatic male urethral stricture 10/28/2011  . Retention of urine 10/09/2011    Current Outpatient Medications on File Prior to Visit  Medication Sig Dispense Refill  . amLODipine (NORVASC) 10 MG tablet     . amLODipine (NORVASC) 5 MG tablet Take 1 tablet (5 mg total) by mouth daily. 90 tablet 3  . Calcium-Magnesium-Vitamin D (CALCIUM 500 PO) Take 500 mg by mouth 2 (two) times daily.    . Coenzyme Q10 (CO Q 10) 100 MG CAPS Take 100 mg by mouth daily.    . colesevelam (WELCHOL) 625 MG tablet Take 625 mg by mouth 2 (two) times daily with a meal.    . diphenhydramine-acetaminophen (TYLENOL PM) 25-500 MG TABS tablet Take 2 tablets by mouth at bedtime as needed.    . enzalutamide (XTANDI) 40 MG capsule Take 160 mg by mouth daily.    . ferrous sulfate 325 (65 FE) MG tablet Take 1 tablet (325 mg total) by mouth daily with breakfast. 90 tablet 3  . imiquimod (ALDARA) 5 % cream APPLY TO SCALP AND EAR NIGHTLY MONDAY THRU FRIDAY FOR 4 WEEKS    . Multiple Vitamin (MULTIVITAMIN) capsule Take 1 capsule  by mouth daily.    . nitroGLYCERIN (NITROSTAT) 0.4 MG SL tablet ONE TABLET UNDER TONGUE EVERY 5 MINUTES AS NEEDED FOR CHEST PAIN 25 tablet 3  . NON FORMULARY Antifungal toenail solution from [place], order faxed 09/08/2018 [facility]    . Omega-3 Fatty Acids (FISH OIL PO) Take 1,000 mg by mouth daily.    . rosuvastatin (CRESTOR) 40 MG tablet Take 40 mg by mouth daily.    Alveda Reasons 20 MG TABS tablet TAKE ONE TABLET EACH DAY 30 tablet 6   No current facility-administered medications on file prior to visit.    No Known Allergies  Objective: Physical Exam  General: Jacob Becker is a pleasant 86 y.o. Caucasian male, WD, WN in NAD. AAO x 3.   Vascular:  Neurovascular status unchanged b/l lower extremities. Capillary refill time to digits immediate b/l. Palpable pedal pulses b/l LE. Pedal hair sparse. Lower extremity skin temperature gradient within normal limits. No edema noted b/l lower extremities.  Dermatological:  Pedal skin with normal turgor, texture and tone bilaterally. No open wounds bilaterally. No interdigital macerations bilaterally. Toenails 1-5 b/l elongated, discolored, dystrophic, thickened, crumbly with subungual debris and tenderness to dorsal palpation.   Noted pressure spots on dorsal PIPJs of b/l 3rd digits and left 4th toe from pressure of dress shoes.  Musculoskeletal:  Normal muscle strength 5/5 to all lower extremity muscle groups  bilaterally. No pain crepitus or joint limitation noted with ROM b/l. Hammertoes noted to the 2-5 bilaterally. Pes planus deformity noted b/l.  Patient ambulates independent of any assistive aids.   Wearing dress shoes b/l.   Neurological:  Protective sensation intact 5/5 intact bilaterally with 10g monofilament b/l. Vibratory sensation intact b/l. Proprioception intact bilaterally. Clonus negative b/l.  Assessment and Plan:  1. Pain due to onychomycosis of toenails of both feet   2. Pes planus of both feet   3. Acquired  hammertoes of both feet    -Examined patient. -No new findings. No new orders. -Toenails 1-5 b/l were debrided in length and girth with sterile nail nippers and dremel without iatrogenic bleeding.  -Continue to use compounded topical antifungal solution from [facility] daily on toenails. -I did discuss benefits of orthotic therapy with him for his flat feet. 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 He related understanding. -Patient to report any pedal injuries to medical professional immediately. -Patient to continue soft, supportive shoe gear daily. -Patient/POA to call should there be question/concern in the interim.  Return in about 3 months (around 10/14/2019) for nail trim/ Xarelto.  Marzetta Board, DPM

## 2019-08-04 ENCOUNTER — Other Ambulatory Visit: Payer: Self-pay | Admitting: Interventional Cardiology

## 2019-08-30 ENCOUNTER — Other Ambulatory Visit: Payer: Self-pay | Admitting: Interventional Cardiology

## 2019-09-07 DIAGNOSIS — L821 Other seborrheic keratosis: Secondary | ICD-10-CM | POA: Diagnosis not present

## 2019-09-07 DIAGNOSIS — D485 Neoplasm of uncertain behavior of skin: Secondary | ICD-10-CM | POA: Diagnosis not present

## 2019-09-07 DIAGNOSIS — Z85828 Personal history of other malignant neoplasm of skin: Secondary | ICD-10-CM | POA: Diagnosis not present

## 2019-09-07 DIAGNOSIS — L570 Actinic keratosis: Secondary | ICD-10-CM | POA: Diagnosis not present

## 2019-09-24 NOTE — Progress Notes (Deleted)
Cardiology Office Note:    Date:  09/24/2019   ID:  Jacob Becker, DOB [birth year], MRN [id number]  PCP:  Lajean Manes, MD  Cardiologist:  No primary care provider on file.   Referring MD: Lajean Manes, MD   No chief complaint on file.   History of Present Illness:    Jacob Becker is a 86 y.o. male with a hx of essential hypertension, hyperlipidemia, coronary artery disease, chronic atrial fibrillation and chronic anticoagulation therapy.  ***  Past Medical History:  Diagnosis Date  . AF (atrial fibrillation) ([place])   . Arthritis   . Cancer [facility])    prostate  . Dysrhythmia    af  . Hyperlipemia   . Hypertension   . Metastatic bone cancer (HCC)    from prostate  . Wears glasses     Past Surgical History:  Procedure Laterality Date  . CHEILECTOMY Right 10/07/2013   Procedure: RIGHT HALLUX CHEILECTOMY;  Surgeon: [name], MD;  Location: [facility];  Service: Orthopedics;  Laterality: Right;  . COLONOSCOPY    . CYSTOSCOPY PROSTATE W/ LASER  2012   turp  . HERNIA REPAIR     rt and lt ing  . KNEE ARTHROSCOPY  2005   right  . TONSILLECTOMY      Current Medications: No outpatient medications have been marked as taking for the 09/29/19 encounter (Appointment) with Belva Crome, MD.     Allergies:   Patient has no known allergies.   Social History   Socioeconomic History  . Marital status: Married    Spouse name: Not on file  . Number of children: Not on file  . Years of education: Not on file  . Highest education level: Not on file  Occupational History  . Not on file  Tobacco Use  . Smoking status: Former Smoker    Quit date: 10/02/1971    Years since quitting: 48.0  . Smokeless tobacco: Never Used  Vaping Use  . Vaping Use: Never used  Substance and Sexual Activity  . Alcohol use: Yes    Comment: daily  . Drug use: No  . Sexual activity: Not on file  Other Topics Concern  . Not on file  Social History Narrative  . Not on  file   Social Determinants of Health   Financial Resource Strain:   . Difficulty of Paying Living Expenses: Not on file  Food Insecurity:   . Worried About Charity fundraiser in the Last Year: Not on file  . Ran Out of Food in the Last Year: Not on file  Transportation Needs:   . Lack of Transportation (Medical): Not on file  . Lack of Transportation (Non-Medical): Not on file  Physical Activity:   . Days of Exercise per Week: Not on file  . Minutes of Exercise per Session: Not on file  Stress:   . Feeling of Stress : Not on file  Social Connections:   . Frequency of Communication with Friends and Family: Not on file  . Frequency of Social Gatherings with Friends and Family: Not on file  . Attends Religious Services: Not on file  . Active Member of Clubs or Organizations: Not on file  . Attends Archivist Meetings: Not on file  . Marital Status: Not on file     Family History: The patient's family history includes Heart failure in his father.  ROS:   Please see the history of present illness.    ***  All other systems reviewed and are negative.  EKGs/Labs/Other Studies Reviewed:    The following studies were reviewed today: ***  EKG:  EKG ***  Recent Labs: 11/05/2018: ALT 10; BUN 29; Creatinine, Ser 1.15; Potassium 4.7; Sodium 138  Recent Lipid Panel    Component Value Date/Time   CHOL 152 11/05/2018 0739   TRIG 72 11/05/2018 0739   HDL 63 11/05/2018 0739   CHOLHDL 2.4 11/05/2018 0739   CHOLHDL 3 09/03/2013 0749   VLDL 17.4 09/03/2013 0749   LDLCALC 75 11/05/2018 0739    Physical Exam:    VS:  There were no vitals taken for this visit.    Wt Readings from Last 3 Encounters:  08/07/18 183 lb (83 kg)  06/24/17 184 lb (83.5 kg)  12/25/15 190 lb 3.2 oz (86.3 kg)     GEN: ***. No acute distress HEENT: Normal NECK: No JVD. LYMPHATICS: No lymphadenopathy CARDIAC: *** RRR without murmur, gallop, or edema. VASCULAR: *** Normal Pulses. No  bruits. RESPIRATORY:  Clear to auscultation without rales, wheezing or rhonchi  ABDOMEN: Soft, non-tender, non-distended, No pulsatile mass, MUSCULOSKELETAL: No deformity  SKIN: Warm and dry NEUROLOGIC:  Alert and oriented x 3 PSYCHIATRIC:  Normal affect   ASSESSMENT:    1. Coronary artery disease involving native coronary artery of native heart without angina pectoris   2. Chronic atrial fibrillation (HCC)   3. Chronic anticoagulation   4. Other hyperlipidemia   5. Essential hypertension   6. Educated about COVID-19 virus infection    PLAN:    In order of problems listed above:  1. ***   Medication Adjustments/Labs and Tests Ordered: Current medicines are reviewed at length with the patient today.  Concerns regarding medicines are outlined above.  No orders of the defined types were placed in this encounter.  No orders of the defined types were placed in this encounter.   There are no Patient Instructions on file for this visit.   Signed, Sinclair Grooms, MD  09/24/2019 5:55 PM    [place]

## 2019-09-27 ENCOUNTER — Telehealth: Payer: Self-pay | Admitting: Interventional Cardiology

## 2019-09-27 NOTE — Telephone Encounter (Signed)
New message:     Patient had to cancel and would like to know if he can take Friday at 2:40.

## 2019-09-27 NOTE — Telephone Encounter (Signed)
Made scheduler aware that it is ok to use that spot for pt.

## 2019-09-29 ENCOUNTER — Ambulatory Visit: Payer: Medicare Other | Admitting: Interventional Cardiology

## 2019-09-29 ENCOUNTER — Other Ambulatory Visit: Payer: Self-pay | Admitting: Interventional Cardiology

## 2019-10-01 ENCOUNTER — Other Ambulatory Visit: Payer: Self-pay

## 2019-10-01 ENCOUNTER — Encounter: Payer: Self-pay | Admitting: Interventional Cardiology

## 2019-10-01 ENCOUNTER — Ambulatory Visit (INDEPENDENT_AMBULATORY_CARE_PROVIDER_SITE_OTHER): Payer: Medicare Other | Admitting: Interventional Cardiology

## 2019-10-01 VITALS — BP 148/82 | HR 53 | Ht 74.0 in | Wt 170.8 lb

## 2019-10-01 DIAGNOSIS — E7849 Other hyperlipidemia: Secondary | ICD-10-CM

## 2019-10-01 DIAGNOSIS — I482 Chronic atrial fibrillation, unspecified: Secondary | ICD-10-CM | POA: Diagnosis not present

## 2019-10-01 DIAGNOSIS — C61 Malignant neoplasm of prostate: Secondary | ICD-10-CM

## 2019-10-01 DIAGNOSIS — Z7189 Other specified counseling: Secondary | ICD-10-CM | POA: Diagnosis not present

## 2019-10-01 DIAGNOSIS — I10 Essential (primary) hypertension: Secondary | ICD-10-CM | POA: Diagnosis not present

## 2019-10-01 DIAGNOSIS — Z7901 Long term (current) use of anticoagulants: Secondary | ICD-10-CM | POA: Diagnosis not present

## 2019-10-01 DIAGNOSIS — I2510 Atherosclerotic heart disease of native coronary artery without angina pectoris: Secondary | ICD-10-CM | POA: Diagnosis not present

## 2019-10-01 NOTE — Patient Instructions (Signed)
Medication Instructions:  Your physician recommends that you continue on your current medications as directed. Please refer to the Current Medication list given to you today.  *If you need a refill on your cardiac medications before your next appointment, please call your pharmacy*   Lab Work: None If you have labs (blood work) drawn today and your tests are completely normal, you will receive your results only by: . MyChart Message (if you have MyChart) OR . A paper copy in the mail If you have any lab test that is abnormal or we need to change your treatment, we will call you to review the results.   Testing/Procedures: None   Follow-Up: At [facility], you and your health needs are our priority.  As part of our continuing mission to provide you with exceptional heart care, we have created designated Provider Care Teams.  These Care Teams include your primary Cardiologist (physician) and Advanced Practice Providers (APPs -  Physician Assistants and Nurse Practitioners) who all work together to provide you with the care you need, when you need it.  We recommend signing up for the patient portal called "MyChart".  Sign up information is provided on this After Visit Summary.  MyChart is used to connect with patients for Virtual Visits (Telemedicine).  Patients are able to view lab/test results, encounter notes, upcoming appointments, etc.  Non-urgent messages can be sent to your provider as well.   To learn more about what you can do with MyChart, go to [email or web address].com.    Your next appointment:   6-9 month(s)  The format for your next appointment:   In Person  Provider:   You may see Nakota W Smith III, MD or one of the following Advanced Practice Providers on your designated Care Team:    Lori Gerhardt, NP  Laura Ingold, NP  Jill McDaniel, NP    Other Instructions   

## 2019-10-01 NOTE — Progress Notes (Signed)
Cardiology Office Note:    Date:  10/01/2019   ID:  Jacob Becker, DOB [birth year], MRN [id number]  PCP:  Lajean Manes, MD  Cardiologist:  Sinclair Grooms, MD   Referring MD: Lajean Manes, MD   Chief Complaint  Patient presents with  . Coronary Artery Disease    History of Present Illness:    Jacob Becker is a 86 y.o. male with a hx of  a hx of essential hypertension, hyperlipidemia, coronary artery disease, chronic atrial fibrillation and chronic anticoagulation therapy.  [name] is under tremendous stress due to the recent fall with hip fracture suffered by Jacob Becker, his wife.  She is now back at home.  The fall occurred when they were visiting [place].  She fell in the bathtub.  She did not immediately go to the hospital however the next day, she was taken to a nearby hospital where it was discovered she had fractured the hip.  She was then transferred back to [facility] and underwent hip surgery by Dr. Adriana Mccallum.  She is now at home undergoing rehab therapy.  She has cognitive impairment which is been relatively stable.  Damani is not sleeping well and overall concerned about getting her physically back to par but concerned about progression of cognitive impairment.  After she fell he physically got her from the bathtub into her bed.  Since that time he has been doing much more physical activity taking care of her at home.  Despite the increased workload and stress, he has not voiced any complaints of angina, excessive dyspnea, palpitations, or noticed swelling/orthopnea.  He has not had syncope.:   Past Medical History:  Diagnosis Date  . AF (atrial fibrillation) ([place])   . Arthritis   . Cancer [facility])    prostate  . Dysrhythmia    af  . Hyperlipemia   . Hypertension   . Metastatic bone cancer (HCC)    from prostate  . Wears glasses     Past Surgical History:  Procedure Laterality Date  . CHEILECTOMY Right 10/07/2013   Procedure: RIGHT HALLUX  CHEILECTOMY;  Surgeon: [name], MD;  Location: [facility];  Service: Orthopedics;  Laterality: Right;  . COLONOSCOPY    . CYSTOSCOPY PROSTATE W/ LASER  2012   turp  . HERNIA REPAIR     rt and lt ing  . KNEE ARTHROSCOPY  2005   right  . TONSILLECTOMY      Current Medications: Current Meds  Medication Sig  . amLODipine (NORVASC) 10 MG tablet   . Calcium-Magnesium-Vitamin D (CALCIUM 500 PO) Take 500 mg by mouth 2 (two) times daily.  . Coenzyme Q10 (CO Q 10) 100 MG CAPS Take 100 mg by mouth daily.  . colesevelam (WELCHOL) 625 MG tablet Take 625 mg by mouth 2 (two) times daily with a meal.  . diphenhydramine-acetaminophen (TYLENOL PM) 25-500 MG TABS tablet Take 2 tablets by mouth at bedtime as needed.  . enzalutamide (XTANDI) 40 MG capsule Take 160 mg by mouth daily.  . ferrous sulfate 325 (65 FE) MG tablet Take 1 tablet (325 mg total) by mouth daily with breakfast.  . imiquimod (ALDARA) 5 % cream APPLY TO SCALP AND EAR NIGHTLY MONDAY THRU FRIDAY FOR 4 WEEKS  . metoprolol succinate (TOPROL-XL) 100 MG 24 hr tablet TAKE 1 TABLET BY MOUTH TWICE DAILY.  . Multiple Vitamin (MULTIVITAMIN) capsule Take 1 capsule by mouth daily.  . nitroGLYCERIN (NITROSTAT) 0.4 MG SL tablet ONE TABLET  UNDER TONGUE EVERY 5 MINUTES AS NEEDED FOR CHEST PAIN  . NON FORMULARY Antifungal toenail solution from [place], order faxed 09/08/2018 [facility]  . Omega-3 Fatty Acids (FISH OIL PO) Take 1,000 mg by mouth daily.  . ramipril (ALTACE) 10 MG capsule Take 1 capsule (10 mg total) by mouth 2 (two) times daily. Please keep upcoming appt in September with Dr. Tamala Julian for future refills. Thank you  . rosuvastatin (CRESTOR) 40 MG tablet Take 40 mg by mouth daily.  Marland Kitchen triamterene-hydrochlorothiazide (DYAZIDE) 37.5-25 MG capsule Take 1 each (1 capsule total) by mouth daily. Please keep upcoming appt with Dr. Tamala Julian in September for future refills. Thank you  . XARELTO 20 MG TABS tablet TAKE ONE TABLET EACH  DAY  . [DISCONTINUED] amLODipine (NORVASC) 5 MG tablet Take 1 tablet (5 mg total) by mouth daily.     Allergies:   Patient has no known allergies.   Social History   Socioeconomic History  . Marital status: Married    Spouse name: Not on file  . Number of children: Not on file  . Years of education: Not on file  . Highest education level: Not on file  Occupational History  . Not on file  Tobacco Use  . Smoking status: Former Smoker    Quit date: 10/02/1971    Years since quitting: 48.0  . Smokeless tobacco: Never Used  Vaping Use  . Vaping Use: Never used  Substance and Sexual Activity  . Alcohol use: Yes    Comment: daily  . Drug use: No  . Sexual activity: Not on file  Other Topics Concern  . Not on file  Social History Narrative  . Not on file   Social Determinants of Health   Financial Resource Strain:   . Difficulty of Paying Living Expenses: Not on file  Food Insecurity:   . Worried About Charity fundraiser in the Last Year: Not on file  . Ran Out of Food in the Last Year: Not on file  Transportation Needs:   . Lack of Transportation (Medical): Not on file  . Lack of Transportation (Non-Medical): Not on file  Physical Activity:   . Days of Exercise per Week: Not on file  . Minutes of Exercise per Session: Not on file  Stress:   . Feeling of Stress : Not on file  Social Connections:   . Frequency of Communication with Friends and Family: Not on file  . Frequency of Social Gatherings with Friends and Family: Not on file  . Attends Religious Services: Not on file  . Active Member of Clubs or Organizations: Not on file  . Attends Archivist Meetings: Not on file  . Marital Status: Not on file     Family History: The patient's family history includes Heart failure in his father.  ROS:   Please see the history of present illness.    Concerned about his wife.  Not sleeping well.  He is getting a new medication "Eligard" from [employer].  All  other systems reviewed and are negative.  EKGs/Labs/Other Studies Reviewed:    The following studies were reviewed today: No new cardiac data  EKG:  EKG with atrial fibrillation, slow ventricular response of 53 bpm.  Recent Labs: 11/05/2018: ALT 10; BUN 29; Creatinine, Ser 1.15; Potassium 4.7; Sodium 138  Recent Lipid Panel    Component Value Date/Time   CHOL 152 11/05/2018 0739   TRIG 72 11/05/2018 0739   HDL 63 11/05/2018 0739  CHOLHDL 2.4 11/05/2018 0739   CHOLHDL 3 09/03/2013 0749   VLDL 17.4 09/03/2013 0749   LDLCALC 75 11/05/2018 0739    Physical Exam:    VS:  BP (!) 148/82   Pulse (!) 53   Ht 6\' 2"  (1.88 m)   Wt 170 lb 12.8 oz (77.5 kg)   SpO2 96%   BMI 21.93 kg/m     Wt Readings from Last 3 Encounters:  10/01/19 170 lb 12.8 oz (77.5 kg)  08/07/18 183 lb (83 kg)  06/24/17 184 lb (83.5 kg)     GEN: Appears pale.  Compatible with age. No acute distress HEENT: Normal NECK: No JVD. LYMPHATICS: No lymphadenopathy CARDIAC: Irregularly irregular RR without murmur, gallop, or edema. VASCULAR:  Normal Pulses. No bruits. RESPIRATORY:  Clear to auscultation without rales, wheezing or rhonchi  ABDOMEN: Soft, non-tender, non-distended, No pulsatile mass, MUSCULOSKELETAL: No deformity  SKIN: Warm and dry NEUROLOGIC:  Alert and oriented x 3 PSYCHIATRIC:  Normal affect   ASSESSMENT:    1. Coronary artery disease involving native coronary artery of native heart without angina pectoris   2. Chronic atrial fibrillation (HCC)   3. Chronic anticoagulation   4. Other hyperlipidemia   5. Essential hypertension   6. Prostate cancer ([place])   7. Educated about COVID-19 virus infection    PLAN:    In order of problems listed above:  1. Secondary prevention discussed.  Encouraged continued efforts to maintain aerobic activity greater than 150 minutes/week. 2. Good heart rate control.  With relative bradycardia perhaps within the next 6 months I will decrease the intensity  of Toprol-XL from 100 mg twice daily to 150 mg total per day. 3. Continue Xarelto 20 mg/day.  Recent creatinine was 1.13 in June.  Hemoglobin was 12.2 in December. 4. Continue Crestor 40 mg/day.  Last LDL was 84 in December 5. Blood pressure is well controlled for age.  No change.  Can continue metoprolol, Norvasc, and Altace. 6. Nathaniel Man, all been managed at [facility] in [place]. 7. He has been vaccinated and is practicing social distancing   Medication Adjustments/Labs and Tests Ordered: Current medicines are reviewed at length with the patient today.  Concerns regarding medicines are outlined above.  Orders Placed This Encounter  Procedures  . EKG 12-Lead   No orders of the defined types were placed in this encounter.   Patient Instructions  Medication Instructions:  Your physician recommends that you continue on your current medications as directed. Please refer to the Current Medication list given to you today.  *If you need a refill on your cardiac medications before your next appointment, please call your pharmacy*   Lab Work: None  If you have labs (blood work) drawn today and your tests are completely normal, you will receive your results only by: Marland Kitchen MyChart Message (if you have MyChart) OR . A paper copy in the mail If you have any lab test that is abnormal or we need to change your treatment, we will call you to review the results.   Testing/Procedures: None   Follow-Up: At [facility], you and your health needs are our priority.  As part of our continuing mission to provide you with exceptional heart care, we have created designated Provider Care Teams.  These Care Teams include your primary Cardiologist (physician) and Advanced Practice Providers (APPs -  Physician Assistants and Nurse Practitioners) who all work together to provide you with the care you need, when you need it.  We recommend  signing up for the patient portal called "MyChart".  Sign  up information is provided on this After Visit Summary.  MyChart is used to connect with patients for Virtual Visits (Telemedicine).  Patients are able to view lab/test results, encounter notes, upcoming appointments, etc.  Non-urgent messages can be sent to your provider as well.   To learn more about what you can do with MyChart, go to [email or web address].    Your next appointment:   6-9 month(s)  The format for your next appointment:   In Person  Provider:   You may see Sinclair Grooms, MD or one of the following Advanced Practice Providers on your designated Care Team:    Truitt Merle, NP  Cecilie Kicks, NP  Kathyrn Drown, NP    Other Instructions      Signed, Sinclair Grooms, MD  10/01/2019 3:57 PM    [place]

## 2019-10-18 ENCOUNTER — Ambulatory Visit: Payer: Medicare Other | Admitting: Podiatry

## 2019-10-21 ENCOUNTER — Telehealth: Payer: Self-pay | Admitting: Interventional Cardiology

## 2019-10-21 NOTE — Telephone Encounter (Signed)
Received MyChart message from pt's daughter through pt's wife's MyChart.  Daughter states pt lost his covid vaccine card and they were trying to see how to get a replacement or records.  Called pt and left detailed VM (ok per DPR) that we do not have a way of replacing the card and to contact PCP to obtain vaccine records.  Advised to call back if any questions.

## 2019-10-25 ENCOUNTER — Other Ambulatory Visit: Payer: Self-pay | Admitting: Interventional Cardiology

## 2019-11-05 DIAGNOSIS — Z23 Encounter for immunization: Secondary | ICD-10-CM | POA: Diagnosis not present

## 2019-11-22 ENCOUNTER — Other Ambulatory Visit: Payer: Self-pay | Admitting: Interventional Cardiology

## 2019-11-24 DIAGNOSIS — Z23 Encounter for immunization: Secondary | ICD-10-CM | POA: Diagnosis not present

## 2020-01-06 DIAGNOSIS — D0421 Carcinoma in situ of skin of right ear and external auricular canal: Secondary | ICD-10-CM | POA: Diagnosis not present

## 2020-01-06 DIAGNOSIS — Z85828 Personal history of other malignant neoplasm of skin: Secondary | ICD-10-CM | POA: Diagnosis not present

## 2020-01-06 DIAGNOSIS — C44622 Squamous cell carcinoma of skin of right upper limb, including shoulder: Secondary | ICD-10-CM | POA: Diagnosis not present

## 2020-01-06 DIAGNOSIS — D0422 Carcinoma in situ of skin of left ear and external auricular canal: Secondary | ICD-10-CM | POA: Diagnosis not present

## 2020-01-06 DIAGNOSIS — D485 Neoplasm of uncertain behavior of skin: Secondary | ICD-10-CM | POA: Diagnosis not present

## 2020-01-06 DIAGNOSIS — C44629 Squamous cell carcinoma of skin of left upper limb, including shoulder: Secondary | ICD-10-CM | POA: Diagnosis not present

## 2020-01-11 ENCOUNTER — Ambulatory Visit (INDEPENDENT_AMBULATORY_CARE_PROVIDER_SITE_OTHER): Payer: Medicare Other | Admitting: Podiatry

## 2020-01-11 ENCOUNTER — Other Ambulatory Visit: Payer: Self-pay

## 2020-01-11 ENCOUNTER — Encounter: Payer: Self-pay | Admitting: Podiatry

## 2020-01-11 DIAGNOSIS — M2042 Other hammer toe(s) (acquired), left foot: Secondary | ICD-10-CM

## 2020-01-11 DIAGNOSIS — M79675 Pain in left toe(s): Secondary | ICD-10-CM | POA: Diagnosis not present

## 2020-01-11 DIAGNOSIS — M2142 Flat foot [pes planus] (acquired), left foot: Secondary | ICD-10-CM

## 2020-01-11 DIAGNOSIS — B351 Tinea unguium: Secondary | ICD-10-CM | POA: Diagnosis not present

## 2020-01-11 DIAGNOSIS — M79674 Pain in right toe(s): Secondary | ICD-10-CM

## 2020-01-11 DIAGNOSIS — M2141 Flat foot [pes planus] (acquired), right foot: Secondary | ICD-10-CM

## 2020-01-11 DIAGNOSIS — M2041 Other hammer toe(s) (acquired), right foot: Secondary | ICD-10-CM

## 2020-01-16 NOTE — Progress Notes (Signed)
Subjective: Jacob Becker is a pleasant 86 y.o. male patient seen today painful mycotic nails b/l that are difficult to trim. Pain interferes with ambulation. Aggravating factors include wearing enclosed shoe gear. Pain is relieved with periodic professional debridement.  He is accompanied by his wife on today's visit. He would like to get an appointment scheduled for her as well. He voices no new pedal problems on today's visit.  Past Medical History:  Diagnosis Date  . AF (atrial fibrillation) ([place])   . Arthritis   . Cancer [facility])    prostate  . Dysrhythmia    af  . Hyperlipemia   . Hypertension   . Metastatic bone cancer (HCC)    from prostate  . Wears glasses     Patient Active Problem List   Diagnosis Date Noted  . Anemia, iron deficiency 06/24/2017  . AF (atrial fibrillation) ([place]) 07/07/2014  . Coronary artery disease involving native heart 07/07/2014  . Essential hypertension 07/07/2014  . Chronic anticoagulation 07/07/2014  . Hyperlipidemia 07/07/2014  . Prostate cancer ([place]) 07/07/2014  . Neurogenic bladder disorder 10/28/2011  . Post-traumatic male urethral stricture 10/28/2011  . Retention of urine 10/09/2011    Current Outpatient Medications on File Prior to Visit  Medication Sig Dispense Refill  . amLODipine (NORVASC) 10 MG tablet     . Calcium-Magnesium-Vitamin D (CALCIUM 500 PO) Take 500 mg by mouth 2 (two) times daily.    . Coenzyme Q10 (CO Q 10) 100 MG CAPS Take 100 mg by mouth daily.    . colesevelam (WELCHOL) 625 MG tablet Take 625 mg by mouth 2 (two) times daily with a meal.    . diphenhydramine-acetaminophen (TYLENOL PM) 25-500 MG TABS tablet Take 2 tablets by mouth at bedtime as needed.    . enzalutamide (XTANDI) 40 MG capsule Take 160 mg by mouth daily.    . ferrous sulfate 325 (65 FE) MG tablet Take 1 tablet (325 mg total) by mouth daily with breakfast. 90 tablet 3  . imiquimod (ALDARA) 5 % cream APPLY TO SCALP AND EAR NIGHTLY MONDAY THRU FRIDAY  FOR 4 WEEKS    . metoprolol succinate (TOPROL-XL) 100 MG 24 hr tablet TAKE 1 TABLET BY MOUTH TWICE DAILY. 180 tablet 3  . Multiple Vitamin (MULTIVITAMIN) capsule Take 1 capsule by mouth daily.    . nitroGLYCERIN (NITROSTAT) 0.4 MG SL tablet ONE TABLET UNDER TONGUE EVERY 5 MINUTES AS NEEDED FOR CHEST PAIN 25 tablet 3  . NON FORMULARY Antifungal toenail solution from [place], order faxed 09/08/2018 [facility]    . Omega-3 Fatty Acids (FISH OIL PO) Take 1,000 mg by mouth daily.    . ramipril (ALTACE) 10 MG capsule TAKE (1) CAPSULE TWICE DAILY. 180 capsule 3  . rosuvastatin (CRESTOR) 40 MG tablet Take 40 mg by mouth daily.    Marland Kitchen triamterene-hydrochlorothiazide (DYAZIDE) 37.5-25 MG capsule TAKE 1 CAPSULE EVERY DAY. 90 capsule 3  . XARELTO 20 MG TABS tablet TAKE ONE TABLET EACH DAY 30 tablet 6   No current facility-administered medications on file prior to visit.    No Known Allergies  Objective: Physical Exam  General: Jacob Becker is a pleasant 86 y.o. Caucasian male, WD, WN in NAD. AAO x 3.   Vascular:  Neurovascular status unchanged b/l lower extremities. Capillary refill time to digits immediate b/l. Palpable pedal pulses b/l LE. Pedal hair sparse. Lower extremity skin temperature gradient within normal limits. No edema noted b/l lower extremities.  Dermatological:  Pedal skin with normal turgor,  texture and tone bilaterally. No open wounds bilaterally. No interdigital macerations bilaterally. Toenails 1-5 b/l elongated, discolored, dystrophic, thickened, crumbly with subungual debris and tenderness to dorsal palpation.   Musculoskeletal:  Normal muscle strength 5/5 to all lower extremity muscle groups bilaterally. No pain crepitus or joint limitation noted with ROM b/l. Hammertoes noted to the 2-5 bilaterally. Pes planus deformity noted b/l.  Patient ambulates independent of any assistive aids.   Wearing dress shoes b/l.   Neurological:  Protective sensation intact 5/5  intact bilaterally with 10g monofilament b/l. Vibratory sensation intact b/l. Proprioception intact bilaterally. Clonus negative b/l.  Assessment and Plan:  1. Pain due to onychomycosis of toenails of both feet   2. Pes planus of both feet   3. Acquired hammertoes of both feet     -Examined patient. -No new findings. No new orders. -Toenails 1-5 b/l were debrided in length and girth with sterile nail nippers and dremel without iatrogenic bleeding.  -Continue to use compounded topical antifungal solution from [facility] daily on toenails. -Patient to report any pedal injuries to medical professional immediately. -Patient to continue soft, supportive shoe gear daily. -Patient/POA to call should there be question/concern in the interim.  Return in about 3 months (around 04/10/2020) for painful mycotic toenails.  Marzetta Board, DPM

## 2020-02-08 ENCOUNTER — Other Ambulatory Visit: Payer: Self-pay | Admitting: Internal Medicine

## 2020-02-08 DIAGNOSIS — C61 Malignant neoplasm of prostate: Secondary | ICD-10-CM

## 2020-02-10 DIAGNOSIS — R739 Hyperglycemia, unspecified: Secondary | ICD-10-CM | POA: Diagnosis not present

## 2020-02-10 DIAGNOSIS — D509 Iron deficiency anemia, unspecified: Secondary | ICD-10-CM | POA: Diagnosis not present

## 2020-02-10 DIAGNOSIS — C61 Malignant neoplasm of prostate: Secondary | ICD-10-CM | POA: Diagnosis not present

## 2020-02-10 DIAGNOSIS — I482 Chronic atrial fibrillation, unspecified: Secondary | ICD-10-CM | POA: Diagnosis not present

## 2020-02-10 DIAGNOSIS — D696 Thrombocytopenia, unspecified: Secondary | ICD-10-CM | POA: Diagnosis not present

## 2020-02-10 DIAGNOSIS — Z79899 Other long term (current) drug therapy: Secondary | ICD-10-CM | POA: Diagnosis not present

## 2020-02-10 DIAGNOSIS — C7951 Secondary malignant neoplasm of bone: Secondary | ICD-10-CM | POA: Diagnosis not present

## 2020-02-10 DIAGNOSIS — R634 Abnormal weight loss: Secondary | ICD-10-CM | POA: Diagnosis not present

## 2020-02-10 DIAGNOSIS — D6869 Other thrombophilia: Secondary | ICD-10-CM | POA: Diagnosis not present

## 2020-02-10 DIAGNOSIS — I10 Essential (primary) hypertension: Secondary | ICD-10-CM | POA: Diagnosis not present

## 2020-02-10 DIAGNOSIS — E7800 Pure hypercholesterolemia, unspecified: Secondary | ICD-10-CM | POA: Diagnosis not present

## 2020-02-10 DIAGNOSIS — Z1389 Encounter for screening for other disorder: Secondary | ICD-10-CM | POA: Diagnosis not present

## 2020-02-10 DIAGNOSIS — Z0000 Encounter for general adult medical examination without abnormal findings: Secondary | ICD-10-CM | POA: Diagnosis not present

## 2020-02-18 ENCOUNTER — Ambulatory Visit (HOSPITAL_COMMUNITY): Payer: Medicare Other

## 2020-02-21 ENCOUNTER — Encounter (HOSPITAL_COMMUNITY)
Admission: RE | Admit: 2020-02-21 | Discharge: 2020-02-21 | Disposition: A | Discharge status: Home / Self Care | Payer: Medicare Other | Source: Ambulatory Visit | Attending: Internal Medicine | Admitting: Internal Medicine

## 2020-02-21 ENCOUNTER — Other Ambulatory Visit: Payer: Self-pay

## 2020-02-21 DIAGNOSIS — K7689 Other specified diseases of liver: Secondary | ICD-10-CM | POA: Diagnosis not present

## 2020-02-21 DIAGNOSIS — C7951 Secondary malignant neoplasm of bone: Secondary | ICD-10-CM | POA: Diagnosis not present

## 2020-02-21 DIAGNOSIS — I2510 Atherosclerotic heart disease of native coronary artery without angina pectoris: Secondary | ICD-10-CM | POA: Diagnosis not present

## 2020-02-21 DIAGNOSIS — C61 Malignant neoplasm of prostate: Secondary | ICD-10-CM | POA: Diagnosis not present

## 2020-02-21 DIAGNOSIS — K862 Cyst of pancreas: Secondary | ICD-10-CM | POA: Diagnosis not present

## 2020-02-21 DIAGNOSIS — M19011 Primary osteoarthritis, right shoulder: Secondary | ICD-10-CM | POA: Diagnosis not present

## 2020-02-21 DIAGNOSIS — M170 Bilateral primary osteoarthritis of knee: Secondary | ICD-10-CM | POA: Diagnosis not present

## 2020-02-21 DIAGNOSIS — I700 Atherosclerosis of aorta: Secondary | ICD-10-CM | POA: Diagnosis not present

## 2020-02-21 DIAGNOSIS — N62 Hypertrophy of breast: Secondary | ICD-10-CM | POA: Diagnosis not present

## 2020-02-21 LAB — POCT I-STAT CREATININE: Creatinine, Ser: 1.1 mg/dL (ref 0.61–1.24)

## 2020-02-21 MED ORDER — TECHNETIUM TC 99M MEDRONATE IV KIT
20.0000 | PACK | Freq: Once | INTRAVENOUS | Status: AC | PRN
  Administered 2020-02-21: 20 via INTRAVENOUS

## 2020-02-21 MED ORDER — IOHEXOL 300 MG/ML  SOLN
100.0000 mL | Freq: Once | INTRAMUSCULAR | Status: AC | PRN
  Administered 2020-02-21: 100 mL via INTRAVENOUS

## 2020-03-02 DIAGNOSIS — Z85828 Personal history of other malignant neoplasm of skin: Secondary | ICD-10-CM | POA: Diagnosis not present

## 2020-03-02 DIAGNOSIS — C44629 Squamous cell carcinoma of skin of left upper limb, including shoulder: Secondary | ICD-10-CM | POA: Diagnosis not present

## 2020-03-02 DIAGNOSIS — C44222 Squamous cell carcinoma of skin of right ear and external auricular canal: Secondary | ICD-10-CM | POA: Diagnosis not present

## 2020-03-09 DIAGNOSIS — Z4802 Encounter for removal of sutures: Secondary | ICD-10-CM | POA: Diagnosis not present

## 2020-04-10 DIAGNOSIS — C61 Malignant neoplasm of prostate: Secondary | ICD-10-CM | POA: Diagnosis not present

## 2020-04-18 ENCOUNTER — Ambulatory Visit (INDEPENDENT_AMBULATORY_CARE_PROVIDER_SITE_OTHER): Payer: Medicare Other | Admitting: Podiatry

## 2020-04-18 ENCOUNTER — Encounter: Payer: Self-pay | Admitting: Podiatry

## 2020-04-18 ENCOUNTER — Other Ambulatory Visit: Payer: Self-pay

## 2020-04-18 DIAGNOSIS — B351 Tinea unguium: Secondary | ICD-10-CM

## 2020-04-18 DIAGNOSIS — M2041 Other hammer toe(s) (acquired), right foot: Secondary | ICD-10-CM

## 2020-04-18 DIAGNOSIS — M2141 Flat foot [pes planus] (acquired), right foot: Secondary | ICD-10-CM

## 2020-04-18 DIAGNOSIS — M79674 Pain in right toe(s): Secondary | ICD-10-CM | POA: Diagnosis not present

## 2020-04-18 DIAGNOSIS — M2142 Flat foot [pes planus] (acquired), left foot: Secondary | ICD-10-CM

## 2020-04-18 DIAGNOSIS — M79675 Pain in left toe(s): Secondary | ICD-10-CM | POA: Diagnosis not present

## 2020-04-18 DIAGNOSIS — M2042 Other hammer toe(s) (acquired), left foot: Secondary | ICD-10-CM

## 2020-04-23 NOTE — Progress Notes (Signed)
Subjective: Jacob Becker is a pleasant 87 y.o. male patient seen today painful mycotic nails b/l that are difficult to trim. Pain interferes with ambulation. Aggravating factors include wearing enclosed shoe gear. Pain is relieved with periodic professional debridement.  He is accompanied by his wife on today's visit. He voices no new pedal problems on today's visit.  No Known Allergies  Objective: Physical Exam  General: Jacob Becker is a pleasant 87 y.o. Caucasian male, WD, WN in NAD. AAO x 3.   Vascular:  Neurovascular status unchanged b/l lower extremities. Capillary refill time to digits immediate b/l. Palpable pedal pulses b/l LE. Pedal hair sparse. Lower extremity skin temperature gradient within normal limits. No edema noted b/l lower extremities.  Dermatological:  Pedal skin with normal turgor, texture and tone bilaterally. No open wounds bilaterally. No interdigital macerations bilaterally. Toenails 1-5 b/l elongated, discolored, dystrophic, thickened, crumbly with subungual debris and tenderness to dorsal palpation.   Musculoskeletal:  Normal muscle strength 5/5 to all lower extremity muscle groups bilaterally. No pain crepitus or joint limitation noted with ROM b/l. Hammertoes noted to the 2-5 bilaterally. Pes planus deformity noted b/l.  Patient ambulates independent of any assistive aids.   Wearing leather dress shoes b/l.   Neurological:  Protective sensation intact 5/5 intact bilaterally with 10g monofilament b/l. Vibratory sensation intact b/l. Proprioception intact bilaterally. Clonus negative b/l.  Assessment and Plan:  1. Pain due to onychomycosis of toenails of both feet   2. Acquired hammertoes of both feet   3. Pes planus of both feet    -Examined patient. -No new findings. No new orders. -Toenails 1-5 b/l were debrided in length and girth with sterile nail nippers and dremel without iatrogenic bleeding.  -Continue to use compounded topical antifungal  solution from [facility] daily on toenails. -Patient to report any pedal injuries to medical professional immediately. -Patient to continue soft, supportive shoe gear daily. -Patient/POA to call should there be question/concern in the interim.  Return in about 3 months (around 07/19/2020).  Marzetta Board, DPM

## 2020-05-06 DIAGNOSIS — Z23 Encounter for immunization: Secondary | ICD-10-CM | POA: Diagnosis not present

## 2020-05-09 DIAGNOSIS — C61 Malignant neoplasm of prostate: Secondary | ICD-10-CM | POA: Diagnosis not present

## 2020-07-25 DIAGNOSIS — C4402 Squamous cell carcinoma of skin of lip: Secondary | ICD-10-CM | POA: Diagnosis not present

## 2020-07-25 DIAGNOSIS — D485 Neoplasm of uncertain behavior of skin: Secondary | ICD-10-CM | POA: Diagnosis not present

## 2020-07-25 DIAGNOSIS — L570 Actinic keratosis: Secondary | ICD-10-CM | POA: Diagnosis not present

## 2020-07-25 DIAGNOSIS — Z85828 Personal history of other malignant neoplasm of skin: Secondary | ICD-10-CM | POA: Diagnosis not present

## 2020-07-25 DIAGNOSIS — C44329 Squamous cell carcinoma of skin of other parts of face: Secondary | ICD-10-CM | POA: Diagnosis not present

## 2020-08-02 ENCOUNTER — Ambulatory Visit: Payer: Medicare Other | Admitting: Podiatry

## 2020-08-06 DIAGNOSIS — Z20822 Contact with and (suspected) exposure to covid-19: Secondary | ICD-10-CM | POA: Diagnosis not present

## 2020-08-30 ENCOUNTER — Encounter: Payer: Self-pay | Admitting: Podiatry

## 2020-08-30 ENCOUNTER — Other Ambulatory Visit: Payer: Self-pay

## 2020-08-30 ENCOUNTER — Ambulatory Visit (INDEPENDENT_AMBULATORY_CARE_PROVIDER_SITE_OTHER): Payer: Medicare Other | Admitting: Podiatry

## 2020-08-30 DIAGNOSIS — B351 Tinea unguium: Secondary | ICD-10-CM | POA: Diagnosis not present

## 2020-08-30 DIAGNOSIS — M79674 Pain in right toe(s): Secondary | ICD-10-CM

## 2020-08-30 DIAGNOSIS — M79675 Pain in left toe(s): Secondary | ICD-10-CM | POA: Diagnosis not present

## 2020-09-03 ENCOUNTER — Encounter: Payer: Self-pay | Admitting: Podiatry

## 2020-09-03 NOTE — Progress Notes (Signed)
Subjective: Jacob Becker is a pleasant 87 y.o. male patient seen today painful thick toenails that are difficult to trim. Pain interferes with ambulation. Aggravating factors include wearing enclosed shoe gear. Pain is relieved with periodic professional debridement.  He voices no new pedal problems on today's visit. He is accompanied by his wife and is her primary caregiver.  PCP is Lajean Manes, MD. Last visit was: 11/24/2019.  No Known Allergies  Objective: Physical Exam  General: Jacob Becker is a pleasant 87 y.o. Caucasian male, WD, WN in NAD. AAO x 3.   Vascular:  Capillary refill time to digits immediate b/l. Palpable DP pulse(s) b/l lower extremities Palpable PT pulse(s) b/l lower extremities Pedal hair sparse. Lower extremity skin temperature gradient within normal limits. No edema noted b/l lower extremities.  Dermatological:  Pedal skin is thin shiny, atrophic b/l lower extremities. No open wounds b/l lower extremities. No interdigital macerations b/l lower extremities. Toenails 1-5 b/l elongated, discolored, dystrophic, thickened, crumbly with subungual debris and tenderness to dorsal palpation. He does have pressure spots on dorsal aspect of contracted hammertoes indicating pressure sustained from shoe gear. No underlying ulcerations.  Musculoskeletal:  Normal muscle strength 5/5 to all lower extremity muscle groups bilaterally. Severe hammertoe deformity noted 1-5 bilaterally. Wearing ill fitting leather dress shoes today. Uses walking stick for ambulation assistance.  Neurological:  Protective sensation intact 5/5 intact bilaterally with 10g monofilament b/l. Vibratory sensation intact b/l.  Assessment and Plan:  1. Pain due to onychomycosis of toenails of both feet   -Examined patient. -Discussed need for shoe gear change. Recommended shoes with mesh or stretchable uppers to accommodate his hammertoe deformity. Patient educated his current pair of shoes are  leather and are causing pressure to his toes and could potentially cause a wound. He related understanding. -Toenails 1-5 b/l were debrided in length and girth with sterile nail nippers and dremel without iatrogenic bleeding.  -Patient to report any pedal injuries to medical professional immediately. -Patient/POA to call should there be question/concern in the interim.  Return in about 3 months (around 11/30/2020).  Marzetta Board, DPM

## 2020-09-06 ENCOUNTER — Other Ambulatory Visit: Payer: Self-pay | Admitting: Interventional Cardiology

## 2020-10-17 DIAGNOSIS — Z23 Encounter for immunization: Secondary | ICD-10-CM | POA: Diagnosis not present

## 2020-10-26 DIAGNOSIS — Z1152 Encounter for screening for COVID-19: Secondary | ICD-10-CM | POA: Diagnosis not present

## 2020-11-03 ENCOUNTER — Other Ambulatory Visit: Payer: Self-pay | Admitting: Interventional Cardiology

## 2020-11-08 DIAGNOSIS — C61 Malignant neoplasm of prostate: Secondary | ICD-10-CM | POA: Diagnosis not present

## 2020-11-21 DIAGNOSIS — Z1152 Encounter for screening for COVID-19: Secondary | ICD-10-CM | POA: Diagnosis not present

## 2020-12-01 ENCOUNTER — Other Ambulatory Visit: Payer: Self-pay | Admitting: Interventional Cardiology

## 2020-12-04 DIAGNOSIS — E785 Hyperlipidemia, unspecified: Secondary | ICD-10-CM | POA: Diagnosis not present

## 2020-12-04 DIAGNOSIS — C61 Malignant neoplasm of prostate: Secondary | ICD-10-CM | POA: Diagnosis not present

## 2020-12-04 DIAGNOSIS — Z85828 Personal history of other malignant neoplasm of skin: Secondary | ICD-10-CM | POA: Diagnosis not present

## 2020-12-04 DIAGNOSIS — R630 Anorexia: Secondary | ICD-10-CM | POA: Diagnosis not present

## 2020-12-04 DIAGNOSIS — Z87898 Personal history of other specified conditions: Secondary | ICD-10-CM | POA: Diagnosis not present

## 2020-12-04 DIAGNOSIS — Z87440 Personal history of urinary (tract) infections: Secondary | ICD-10-CM | POA: Diagnosis not present

## 2020-12-04 DIAGNOSIS — R351 Nocturia: Secondary | ICD-10-CM | POA: Diagnosis not present

## 2020-12-04 DIAGNOSIS — Z8546 Personal history of malignant neoplasm of prostate: Secondary | ICD-10-CM | POA: Diagnosis not present

## 2020-12-04 DIAGNOSIS — Z923 Personal history of irradiation: Secondary | ICD-10-CM | POA: Diagnosis not present

## 2020-12-04 DIAGNOSIS — R197 Diarrhea, unspecified: Secondary | ICD-10-CM | POA: Diagnosis not present

## 2020-12-04 DIAGNOSIS — G3184 Mild cognitive impairment, so stated: Secondary | ICD-10-CM | POA: Diagnosis not present

## 2020-12-04 DIAGNOSIS — Z789 Other specified health status: Secondary | ICD-10-CM | POA: Diagnosis not present

## 2020-12-04 DIAGNOSIS — I10 Essential (primary) hypertension: Secondary | ICD-10-CM | POA: Diagnosis not present

## 2020-12-04 DIAGNOSIS — K5900 Constipation, unspecified: Secondary | ICD-10-CM | POA: Diagnosis not present

## 2020-12-05 DIAGNOSIS — Z923 Personal history of irradiation: Secondary | ICD-10-CM | POA: Insufficient documentation

## 2020-12-05 DIAGNOSIS — R351 Nocturia: Secondary | ICD-10-CM | POA: Insufficient documentation

## 2020-12-05 DIAGNOSIS — R197 Diarrhea, unspecified: Secondary | ICD-10-CM | POA: Insufficient documentation

## 2020-12-05 DIAGNOSIS — Z87898 Personal history of other specified conditions: Secondary | ICD-10-CM | POA: Insufficient documentation

## 2020-12-05 DIAGNOSIS — Z789 Other specified health status: Secondary | ICD-10-CM | POA: Insufficient documentation

## 2020-12-05 DIAGNOSIS — G3184 Mild cognitive impairment, so stated: Secondary | ICD-10-CM | POA: Insufficient documentation

## 2020-12-05 DIAGNOSIS — R630 Anorexia: Secondary | ICD-10-CM | POA: Insufficient documentation

## 2020-12-06 ENCOUNTER — Encounter: Payer: Self-pay | Admitting: Podiatry

## 2020-12-06 ENCOUNTER — Telehealth (INDEPENDENT_AMBULATORY_CARE_PROVIDER_SITE_OTHER): Payer: Medicare Other | Admitting: Podiatry

## 2020-12-06 ENCOUNTER — Ambulatory Visit (INDEPENDENT_AMBULATORY_CARE_PROVIDER_SITE_OTHER): Payer: Medicare Other | Admitting: Podiatry

## 2020-12-06 ENCOUNTER — Other Ambulatory Visit: Payer: Self-pay

## 2020-12-06 DIAGNOSIS — L89510 Pressure ulcer of right ankle, unstageable: Secondary | ICD-10-CM

## 2020-12-06 DIAGNOSIS — M2042 Other hammer toe(s) (acquired), left foot: Secondary | ICD-10-CM

## 2020-12-06 DIAGNOSIS — L89520 Pressure ulcer of left ankle, unstageable: Secondary | ICD-10-CM

## 2020-12-06 DIAGNOSIS — M2041 Other hammer toe(s) (acquired), right foot: Secondary | ICD-10-CM

## 2020-12-06 DIAGNOSIS — M79674 Pain in right toe(s): Secondary | ICD-10-CM

## 2020-12-06 DIAGNOSIS — B351 Tinea unguium: Secondary | ICD-10-CM

## 2020-12-06 NOTE — Telephone Encounter (Signed)
Phoned patient's daughter, Mrs. Shelbie Ammons and informed her of pressure areas on inside of Dad's ankles. Also informed her about need for change in shoe gear to shoes with stretchable uppers to prevent wounds on toes. Assisting her with signing him up for MyChart so she will have real-time access to his records. Also will send her Dad's next appointment date/time via email so she is aware.

## 2020-12-06 NOTE — Patient Instructions (Signed)
Place pillow between ankles when in bed sleeping to avoid pressure.  Recommend Skechers Loafers with stretchable uppers and memory foam insoles. They can be purchased at [employer], [employer] or [employer]. Also on [email or web address].    Pressure Injury A pressure injury is damage to the skin and underlying tissue that results from pressure being applied to an area of the body. It often affects people who must spend a long time in a bed or chair because of a medical condition. Pressure injuries usually occur: Over bony parts of the body, such as the tailbone, shoulders, elbows, hips, heels, spine, ankles, and back of the head. Under medical devices that make contact with the body, such as respiratory equipment, stockings, tubes, and splints. Pressure injuries start as reddened areas on the skin and can lead to pain and an open wound. What are the causes? This condition is caused by frequent or constant pressure to an area of the body. Decreased blood flow to the skin can eventually cause the skin tissue to die and break down, causing a wound. What increases the risk? You are more likely to develop this condition if you: Are in the hospital or an extended care facility. Are bedridden or in a wheelchair. Have an injury or disease that keeps you from: Moving normally. Feeling pain or pressure. Have a condition that: Makes you sleepy or less alert. Causes poor blood flow. Need to wear a medical device. Have poor control of your bladder or bowel functions (incontinence). Have poor nutrition (malnutrition). If you are at risk for pressure injuries, your health care provider may recommend certain types of mattresses, mattress covers, pillows, cushions, or boots to help prevent them. These may include products filled with air, foam, gel, or sand. What are the signs or symptoms? Symptoms of this condition depend on the severity of the injury. Symptoms may include: Red or dark areas of the skin. Pain, warmth,  or a change of skin texture. Blisters. An open wound. How is this diagnosed? This condition is diagnosed with a medical history and physical exam. You may also have tests, such as: Blood tests. Imaging tests. Blood flow tests. Your pressure injury will be staged based on its severity. Staging is based on: The depth of the tissue injury, including whether there is exposure of muscle, bone, or tendon. The cause of the pressure injury. How is this treated? This condition may be treated by: Relieving or redistributing pressure on your skin. This includes: Frequently changing your position. Avoiding positions that caused the wound or that can make the wound worse. Using specific bed mattresses, chair cushions, or protective boots. Moving medical devices from an area of pressure, or placing padding between the skin and the device. Using foams, creams, or powders to prevent rubbing (friction) on the skin. Keeping your skin clean and dry. This may include using a skin cleanser or skin barrier as told by your health care provider. Cleaning your injury and removing any dead tissue from the wound (debridement). Placing a bandage (dressing) over your injury. Using medicines for pain or to prevent or treat infection. Surgery may be needed if other treatments are not working or if your injury is very deep. Follow these instructions at home: Wound care Follow instructions from your health care provider about how to take care of your wound. Make sure you: Wash your hands with soap and water before and after you change your bandage (dressing). If soap and water are not available, use hand sanitizer. Change your dressing as  told by your health care provider.  Check your wound every day for signs of infection. Have a caregiver do this for you if you are not able. Check for: Redness, swelling, or increased pain. More fluid or blood. Warmth. Pus or a bad smell. Skin care Keep your skin clean and dry.  Gently pat your skin dry. Do not rub or massage your skin. You or a caregiver should check your skin every day for any changes in color or any new blisters or sores (ulcers). Medicines Take over-the-counter and prescription medicines only as told by your health care provider. If you were prescribed an antibiotic medicine, take or apply it as told by your health care provider. Do not stop using the antibiotic even if your condition improves. Reducing and redistributing pressure Do not lie or sit in one position for a long time. Move or change position every 1-2 hours, or as told by your health care provider. Use pillows or cushions to reduce pressure. Ask your health care provider to recommend cushions or pads for you. General instructions  Eat a healthy diet that includes lots of protein. Drink enough fluid to keep your urine pale yellow. Be as active as you can every day. Ask your health care provider to suggest safe exercises or activities. Do not abuse drugs or alcohol. Do not use any products that contain nicotine or tobacco, such as cigarettes, e-cigarettes, and chewing tobacco. If you need help quitting, ask your health care provider. Keep all follow-up visits as told by your health care provider. This is important. Contact a health care provider if: You have: A fever or chills. Pain that is not helped by medicine. Any changes in skin color. New blisters or sores. Pus or a bad smell coming from your wound. Redness, swelling, or pain around your wound. More fluid or blood coming from your wound. Your wound does not improve after 1-2 weeks of treatment. Summary A pressure injury is damage to the skin and underlying tissue that results from pressure being applied to an area of the body. Do not lie or sit in one position for a long time. Your health care provider may advise you to move or change position every 1-2 hours. Follow instructions from your health care provider about how to  take care of your wound. Keep all follow-up visits as told by your health care provider. This is important. This information is not intended to replace advice given to you by your health care provider. Make sure you discuss any questions you have with your health care provider. Document Revised: 08/13/2017 Document Reviewed: 08/13/2017 Elsevier Patient Education  [place].

## 2020-12-07 DIAGNOSIS — Z85828 Personal history of other malignant neoplasm of skin: Secondary | ICD-10-CM | POA: Diagnosis not present

## 2020-12-07 DIAGNOSIS — L570 Actinic keratosis: Secondary | ICD-10-CM | POA: Diagnosis not present

## 2020-12-07 DIAGNOSIS — D485 Neoplasm of uncertain behavior of skin: Secondary | ICD-10-CM | POA: Diagnosis not present

## 2020-12-07 NOTE — Progress Notes (Signed)
  Subjective:  Patient ID: Jacob Becker, male    DOB: 1930-01-15,  MRN: [id number]  Jacob Becker presents to clinic today for painful thick toenails that are difficult to trim. Pain interferes with ambulation. Aggravating factors include wearing enclosed shoe gear. Pain is relieved with periodic professional debridement.  Patient presents with new concern of spots on inside of both ankles. Has been present for the past couple of weeks.Marland Kitchen He denies any drainage or swelling. Denies any fever, chills, night sweats, nausea or vomiting.   Left ankle (medial)     Right ankle (medial)  PCP is Lajean Manes, MD , and last visit was 02/10/2020.  He states he will be retiring at the end of this year.  No Known Allergies  Review of Systems: Negative except as noted in the HPI. Objective:   Constitutional Jacob Becker is a pleasant 87 y.o. Caucasian male, WD, WN in NAD. AAO x 3.   Vascular CFT <3 seconds b/l LE. Palpable DP/PT pulses b/l LE. Digital hair absent b/l. Skin temperature gradient WNL b/l. No pain with calf compression b/l. No edema noted b/l. Lower extremity skin temperature gradient within normal limits. No cyanosis or clubbing noted.  Neurologic Normal speech. Oriented to person, place, and time. Protective sensation intact 5/5 intact bilaterally with 10g monofilament b/l. Vibratory sensation intact b/l.  Dermatologic Pedal skin thin, shiny and atrophic b/l LE. Small eschar areas noted medial aspect of both ankles. No edema, no fluctuance, no blistering, no warmth, no abnormal coolness. +Blanchable erythema noted medial aspect right ankle. No interdigital macerations noted b/l LE. Toenails 1-5 b/l elongated, discolored, dystrophic, thickened, crumbly with subungual debris and tenderness to dorsal palpation. No hyperkeratotic nor porokeratotic lesions present on today's visit.  Orthopedic: Normal muscle strength 5/5 to all lower extremity muscle groups bilaterally. Severe  hammertoe deformity noted 1-5 bilaterally. Pes planus deformity noted b/l lower extremities. Wearing ill fitting shoe gear.   Radiographs: None Assessment:   1. Pain due to onychomycosis of toenails of both feet   2. Pressure sore on ankle, left, unstageable ([place])   3. Pressure sore on ankle, right, unstageable ([place])   4. Hammertoes of both feet    Plan:  Patient was evaluated and treated and all questions answered. Consent given for treatment as described below: -Examined patient. Offered heel protectors which we have here in the office and he declines on today stating he will place a pillow between his ankles at night. Patient given printed information with instructions to place pillows between his ankles when in bed.  I cleansed areas with alcohol and applied band-aids to both ankles. He has given me permission to contact his daughter, Shelbie Ammons, regarding findings on today's visit. -Toenails 1-5 b/l were debrided in length and girth with sterile nail nippers and dremel without iatrogenic bleeding.  -For his severe hammertoe deformity, I recommended shoes with stretchable uppers and memory foam insoles such as Skechers. -Phoned daughter regarding findings on today's visit and recommended shoe gear change for his hammertoe deformity to prevent skin breakdown on toes. She will assist with this, but states Dad will most likely agree to sneakers as opposed to [employer].  I will have Mr. Montrose follow up with Dr. Jacqualyn Posey in 2 weeks for follow up of pressure sores of ankles. -Patient/POA to call should there be question/concern in the interim. -Return in about 2 weeks for re-evaluation by Dr. Jacqualyn Posey.  Marzetta Board, DPM

## 2020-12-26 ENCOUNTER — Ambulatory Visit: Payer: Medicare Other | Admitting: Podiatry

## 2020-12-27 DIAGNOSIS — Z1152 Encounter for screening for COVID-19: Secondary | ICD-10-CM | POA: Diagnosis not present

## 2020-12-29 ENCOUNTER — Other Ambulatory Visit: Payer: Self-pay | Admitting: Interventional Cardiology

## 2021-01-03 DIAGNOSIS — Z85828 Personal history of other malignant neoplasm of skin: Secondary | ICD-10-CM | POA: Diagnosis not present

## 2021-01-03 DIAGNOSIS — C4442 Squamous cell carcinoma of skin of scalp and neck: Secondary | ICD-10-CM | POA: Diagnosis not present

## 2021-01-04 DIAGNOSIS — Z20822 Contact with and (suspected) exposure to covid-19: Secondary | ICD-10-CM | POA: Diagnosis not present

## 2021-01-10 ENCOUNTER — Telehealth: Payer: Self-pay | Admitting: Interventional Cardiology

## 2021-01-10 MED ORDER — RAMIPRIL 10 MG PO CAPS: 10.0000 mg | ORAL_CAPSULE | Freq: Two times a day (BID) | ORAL | 1 refills | Status: DC

## 2021-01-10 MED ORDER — TRIAMTERENE-HCTZ 37.5-25 MG PO CAPS: 1.0000 | ORAL_CAPSULE | Freq: Every day | ORAL | 1 refills | Status: DC

## 2021-01-10 NOTE — Telephone Encounter (Signed)
°*  STAT* If patient is at the pharmacy, call can be transferred to refill team.   1. Which medications need to be refilled? (please list name of each medication and dose if known)  ramipril (ALTACE) 10 MG capsule triamterene-hydrochlorothiazide (DYAZIDE) 37.5-25 MG capsule   2. Which pharmacy/location (including street and city if local pharmacy) is medication to be sent to? [place], [address]  3. Do they need a 30 day or 90 day supply?  30 day supply  Pt has an upcoming appt with Dr. Tamala Julian

## 2021-01-10 NOTE — Telephone Encounter (Signed)
Pt's medication was sent to pt's pharmacy as requested. Confirmation received.  °

## 2021-01-12 ENCOUNTER — Other Ambulatory Visit (HOSPITAL_COMMUNITY): Payer: Self-pay | Admitting: Internal Medicine

## 2021-01-12 ENCOUNTER — Other Ambulatory Visit: Payer: Self-pay | Admitting: Internal Medicine

## 2021-01-12 DIAGNOSIS — C61 Malignant neoplasm of prostate: Secondary | ICD-10-CM

## 2021-01-12 DIAGNOSIS — Z23 Encounter for immunization: Secondary | ICD-10-CM | POA: Diagnosis not present

## 2021-01-19 ENCOUNTER — Other Ambulatory Visit: Payer: Self-pay

## 2021-01-19 ENCOUNTER — Ambulatory Visit (HOSPITAL_COMMUNITY)
Admission: RE | Admit: 2021-01-19 | Discharge: 2021-01-19 | Disposition: A | Discharge status: Home / Self Care | Payer: Medicare Other | Source: Ambulatory Visit | Attending: Internal Medicine | Admitting: Internal Medicine

## 2021-01-19 ENCOUNTER — Encounter (HOSPITAL_COMMUNITY)
Admission: RE | Admit: 2021-01-19 | Discharge: 2021-01-19 | Disposition: A | Discharge status: Home / Self Care | Payer: Medicare Other | Source: Ambulatory Visit | Attending: Internal Medicine | Admitting: Internal Medicine

## 2021-01-19 DIAGNOSIS — C61 Malignant neoplasm of prostate: Secondary | ICD-10-CM

## 2021-01-19 DIAGNOSIS — I700 Atherosclerosis of aorta: Secondary | ICD-10-CM | POA: Diagnosis not present

## 2021-01-19 DIAGNOSIS — I2510 Atherosclerotic heart disease of native coronary artery without angina pectoris: Secondary | ICD-10-CM | POA: Diagnosis not present

## 2021-01-19 DIAGNOSIS — I722 Aneurysm of renal artery: Secondary | ICD-10-CM | POA: Diagnosis not present

## 2021-01-19 DIAGNOSIS — K862 Cyst of pancreas: Secondary | ICD-10-CM | POA: Diagnosis not present

## 2021-01-19 DIAGNOSIS — C7951 Secondary malignant neoplasm of bone: Secondary | ICD-10-CM | POA: Diagnosis not present

## 2021-01-19 MED ORDER — TECHNETIUM TC 99M MEDRONATE IV KIT
20.0000 | PACK | Freq: Once | INTRAVENOUS | Status: AC | PRN
  Administered 2021-01-19: 12:00:00 20.2 via INTRAVENOUS

## 2021-01-19 MED ORDER — IOHEXOL 300 MG/ML  SOLN
100.0000 mL | Freq: Once | INTRAMUSCULAR | Status: AC | PRN
  Administered 2021-01-19: 10:00:00 75 mL via INTRAVENOUS

## 2021-01-25 DIAGNOSIS — R972 Elevated prostate specific antigen [PSA]: Secondary | ICD-10-CM | POA: Diagnosis not present

## 2021-02-06 DIAGNOSIS — C7951 Secondary malignant neoplasm of bone: Secondary | ICD-10-CM | POA: Diagnosis not present

## 2021-02-06 DIAGNOSIS — C61 Malignant neoplasm of prostate: Secondary | ICD-10-CM | POA: Diagnosis not present

## 2021-03-08 ENCOUNTER — Other Ambulatory Visit: Payer: Self-pay | Admitting: Interventional Cardiology

## 2021-03-20 ENCOUNTER — Ambulatory Visit: Payer: Medicare Other | Admitting: Podiatry

## 2021-03-26 DIAGNOSIS — Z20822 Contact with and (suspected) exposure to covid-19: Secondary | ICD-10-CM | POA: Diagnosis not present

## 2021-03-26 DIAGNOSIS — D485 Neoplasm of uncertain behavior of skin: Secondary | ICD-10-CM | POA: Diagnosis not present

## 2021-03-26 DIAGNOSIS — L570 Actinic keratosis: Secondary | ICD-10-CM | POA: Diagnosis not present

## 2021-03-26 DIAGNOSIS — Z85828 Personal history of other malignant neoplasm of skin: Secondary | ICD-10-CM | POA: Diagnosis not present

## 2021-03-26 DIAGNOSIS — D045 Carcinoma in situ of skin of trunk: Secondary | ICD-10-CM | POA: Diagnosis not present

## 2021-04-05 DIAGNOSIS — Z20828 Contact with and (suspected) exposure to other viral communicable diseases: Secondary | ICD-10-CM | POA: Diagnosis not present

## 2021-04-10 DIAGNOSIS — I482 Chronic atrial fibrillation, unspecified: Secondary | ICD-10-CM | POA: Diagnosis not present

## 2021-04-10 DIAGNOSIS — G3184 Mild cognitive impairment, so stated: Secondary | ICD-10-CM | POA: Diagnosis not present

## 2021-04-10 DIAGNOSIS — D649 Anemia, unspecified: Secondary | ICD-10-CM | POA: Diagnosis not present

## 2021-04-10 DIAGNOSIS — I700 Atherosclerosis of aorta: Secondary | ICD-10-CM | POA: Diagnosis not present

## 2021-04-10 DIAGNOSIS — Z79899 Other long term (current) drug therapy: Secondary | ICD-10-CM | POA: Diagnosis not present

## 2021-04-10 DIAGNOSIS — Z0000 Encounter for general adult medical examination without abnormal findings: Secondary | ICD-10-CM | POA: Diagnosis not present

## 2021-04-10 DIAGNOSIS — C61 Malignant neoplasm of prostate: Secondary | ICD-10-CM | POA: Diagnosis not present

## 2021-04-10 DIAGNOSIS — R634 Abnormal weight loss: Secondary | ICD-10-CM | POA: Diagnosis not present

## 2021-04-10 DIAGNOSIS — R739 Hyperglycemia, unspecified: Secondary | ICD-10-CM | POA: Diagnosis not present

## 2021-04-10 DIAGNOSIS — D509 Iron deficiency anemia, unspecified: Secondary | ICD-10-CM | POA: Diagnosis not present

## 2021-04-10 DIAGNOSIS — D696 Thrombocytopenia, unspecified: Secondary | ICD-10-CM | POA: Diagnosis not present

## 2021-04-10 DIAGNOSIS — I10 Essential (primary) hypertension: Secondary | ICD-10-CM | POA: Diagnosis not present

## 2021-04-10 DIAGNOSIS — Z1389 Encounter for screening for other disorder: Secondary | ICD-10-CM | POA: Diagnosis not present

## 2021-04-10 DIAGNOSIS — E7800 Pure hypercholesterolemia, unspecified: Secondary | ICD-10-CM | POA: Diagnosis not present

## 2021-04-10 DIAGNOSIS — C7951 Secondary malignant neoplasm of bone: Secondary | ICD-10-CM | POA: Diagnosis not present

## 2021-04-17 DIAGNOSIS — Z20822 Contact with and (suspected) exposure to covid-19: Secondary | ICD-10-CM | POA: Diagnosis not present

## 2021-04-30 ENCOUNTER — Ambulatory Visit: Payer: Medicare Other | Admitting: Podiatry

## 2021-05-02 ENCOUNTER — Ambulatory Visit: Payer: Medicare Other | Admitting: Podiatry

## 2021-05-03 DIAGNOSIS — Z20822 Contact with and (suspected) exposure to covid-19: Secondary | ICD-10-CM | POA: Diagnosis not present

## 2021-05-11 DIAGNOSIS — I10 Essential (primary) hypertension: Secondary | ICD-10-CM | POA: Diagnosis not present

## 2021-05-11 DIAGNOSIS — D509 Iron deficiency anemia, unspecified: Secondary | ICD-10-CM | POA: Diagnosis not present

## 2021-05-14 DIAGNOSIS — Z20822 Contact with and (suspected) exposure to covid-19: Secondary | ICD-10-CM | POA: Diagnosis not present

## 2021-05-23 DIAGNOSIS — L570 Actinic keratosis: Secondary | ICD-10-CM | POA: Diagnosis not present

## 2021-05-23 DIAGNOSIS — Z85828 Personal history of other malignant neoplasm of skin: Secondary | ICD-10-CM | POA: Diagnosis not present

## 2021-05-24 NOTE — Progress Notes (Signed)
?Cardiology Office Note:   ? ?Date:  05/25/2021  ? ?ID:  Jacob Becker, DOB 05-22-1929, MRN [id number] ? ?PCP:  Lajean Manes, MD  ?Cardiologist:  Sinclair Grooms, MD  ? ?Referring MD: Lajean Manes, MD  ? ?Chief Complaint  ?Patient presents with  ? Coronary Artery Disease  ? Atrial Fibrillation  ? ? ?History of Present Illness:   ? ?Jacob Becker is a 88 y.o. male with a hx of essential hypertension, hyperlipidemia, coronary artery disease, chronic atrial fibrillation and chronic anticoagulation therapy. ? ? ?[name] is 1 day away from his 24th birthday.  His career and contributions to the [employer] this afternoon.  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 ? 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  It no longer exists but at a formative time during the career of Mr. 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  This led to Mr. Hagedorn to travel all over the world closing deals.   ? ?He further share with me all the referrals that he has made to me over the years including his son and other prominent members of the [facility] community.  He was in a very talkative and gracious mood today, and I greatly and humbly appreciate the sharing and the support over the years. ? ?He is doing well.  Not as active as he was before.  Denies dizziness with standing.  He denies angina.  He does have some shortness of breath but no orthopnea.  There is no peripheral edema.  His wife has advanced memory problems.  He is devoted to keeping her within their home. ? ?Past Medical History:  ?Diagnosis Date  ? AF (atrial fibrillation) ([place])   ? Arthritis   ? Cancer [facility])   ? prostate  ? Dysrhythmia   ? af  ? Hyperlipemia   ? Hypertension   ? Metastatic bone cancer   ? from prostate  ? Wears  glasses   ? ? ?Past Surgical History:  ?Procedure Laterality Date  ? CHEILECTOMY Right 10/07/2013  ? Procedure: RIGHT HALLUX CHEILECTOMY;  Surgeon: [name], MD;  Location: [facility];  Service: Orthopedics;  Laterality: Right;  ? COLONOSCOPY    ? CYSTOSCOPY PROSTATE W/ LASER  2012  ? turp  ? HERNIA REPAIR    ? rt and lt ing  ? KNEE ARTHROSCOPY  2005  ? right  ? TONSILLECTOMY    ? ? ?Current Medications: ?Current Meds  ?Medication Sig  ? amLODipine (NORVASC) 10 MG tablet   ? Calcium-Magnesium-Vitamin D (CALCIUM 500 PO) Take 500 mg by mouth 2 (two) times daily.  ? Coenzyme Q10 (CO Q 10) 100 MG CAPS Take 100 mg by mouth daily.  ? colesevelam (WELCHOL) 625 MG tablet Take 625 mg by mouth 2 (two) times daily with a meal.  ? diphenhydramine-acetaminophen (TYLENOL PM) 25-500 MG TABS tablet Take 2 tablets by mouth at bedtime as needed.  ? doxycycline (VIBRAMYCIN) 100 MG capsule Take 100 mg by mouth 2 (two) times daily.  ? enzalutamide (XTANDI) 40 MG capsule Take 160 mg by mouth daily.  ? ferrous sulfate 325 (65 FE) MG tablet Take 1 tablet (325 mg total) by mouth daily with breakfast.  ? imiquimod (ALDARA) 5 % cream APPLY TO SCALP AND EAR  NIGHTLY MONDAY THRU FRIDAY FOR 4 WEEKS  ? metoprolol succinate (TOPROL-XL) 100 MG 24 hr tablet Take 1 tablet ('100mg'$ ) every morning and 0.5 tablet ('50mg'$ ) every evening) Take with or immediately following a meal.  ? Multiple Vitamin (MULTIVITAMIN) capsule Take 1 capsule by mouth daily.  ? nitroGLYCERIN (NITROSTAT) 0.4 MG SL tablet ONE TABLET UNDER TONGUE EVERY 5 MINUTES AS NEEDED FOR CHEST PAIN  ? NON FORMULARY Antifungal toenail solution from [place], order faxed 09/08/2018 [facility]  ? Omega-3 Fatty Acids (FISH OIL PO) Take 1,000 mg by mouth daily.  ? ramipril (ALTACE) 10 MG capsule Take 1 capsule (10 mg total) by mouth 2 (two) times daily. Please keep upcoming appt with Dr. Tamala Julian in April 2023 before anymore refills. Thank you  ? rosuvastatin (CRESTOR) 40 MG  tablet Take 40 mg by mouth daily.  ? triamcinolone (KENALOG) 0.1 % Apply topically 2 (two) times daily.  ? triamterene-hydrochlorothiazide (DYAZIDE) 37.5-25 MG capsule Take 1 each (1 capsule total) by mouth daily. Please keep upcoming appt with Dr. Tamala Julian in April 2023 before anymore refills. Thank you  ? XARELTO 20 MG TABS tablet TAKE ONE TABLET EACH DAY  ? [DISCONTINUED] metoprolol succinate (TOPROL-XL) 100 MG 24 hr tablet Take 1 tablet (100 mg total) by mouth 2 (two) times daily. Please keep upcoming appt in April 2023 with Dr. Tamala Julian before anymore refills. Thank you Final Attempt  ?  ? ?Allergies:   Patient has no known allergies.  ? ?Social History  ? ?Socioeconomic History  ? Marital status: Married  ?  Spouse name: Not on file  ? Number of children: Not on file  ? Years of education: Not on file  ? Highest education level: Not on file  ?Occupational History  ? Not on file  ?Tobacco Use  ? Smoking status: Former  ?  Types: Cigarettes  ?  Quit date: 10/02/1971  ?  Years since quitting: 49.6  ? Smokeless tobacco: Never  ?Vaping Use  ? Vaping Use: Never used  ?Substance and Sexual Activity  ? Alcohol use: Yes  ?  Comment: daily  ? Drug use: No  ? Sexual activity: Not on file  ?Other Topics Concern  ? Not on file  ?Social History Narrative  ? Not on file  ? ?Social Determinants of Health  ? ?Financial Resource Strain: Not on file  ?Food Insecurity: Not on file  ?Transportation Needs: Not on file  ?Physical Activity: Not on file  ?Stress: Not on file  ?Social Connections: Not on file  ?  ? ?Family History: ?The patient's family history includes Heart failure in his father. ? ?ROS:   ?Please see the history of present illness.    ?Has some concern about weight loss.  Dr. Felipa Eth had voiced concern and put him on boost.  He has been able to gain weight.  His appetite has remained stable.  All other systems reviewed and are negative. ? ?EKGs/Labs/Other Studies Reviewed:   ? ?The following studies were reviewed  today: ?No new or recent imaging ? ?EKG:  EKG atrial fibs with slow ventricular response at 50 bpm.  Left axis deviation, incomplete right bundle branch block.  When compared to the prior tracing from September 2021, the rate is slightly slower than the 53 bpm noted a year ago.  No other significant changes noted. ? ?Recent Labs: ?No results found for requested labs within last 8760 hours.  ?Recent Lipid Panel ?   ?Component Value Date/Time  ? CHOL 152 11/05/2018 0739  ?  TRIG 72 11/05/2018 0739  ? HDL 63 11/05/2018 0739  ? CHOLHDL 2.4 11/05/2018 0739  ? CHOLHDL 3 09/03/2013 0749  ? VLDL 17.4 09/03/2013 0749  ? Gregg 75 11/05/2018 0739  ? ? ?Physical Exam:   ? ?VS:  BP 122/62   Pulse (!) 50   Ht '6\' 2"'$  (1.88 m)   Wt 164 lb (74.4 kg)   SpO2 95%   BMI 21.06 kg/m?    ? ?Wt Readings from Last 3 Encounters:  ?05/25/21 164 lb (74.4 kg)  ?10/01/19 170 lb 12.8 oz (77.5 kg)  ?08/07/18 183 lb (83 kg)  ?  ? ?GEN: Compatible with age, appears somewhat pale.  Weight is down 19 pounds since 2020 and 6 pounds compared to 2021.Marland Kitchen No acute distress ?HEENT: Normal ?NECK: No JVD. ?LYMPHATICS: No lymphadenopathy ?CARDIAC: No murmur.IIRR no gallop, or edema. ?VASCULAR:  Normal Pulses. No bruits. ?RESPIRATORY:  Clear to auscultation without rales, wheezing or rhonchi  ?ABDOMEN: Soft, non-tender, non-distended, No pulsatile mass, ?MUSCULOSKELETAL: No deformity  ?SKIN: Warm and dry ?NEUROLOGIC:  Alert and oriented x 3 ?PSYCHIATRIC:  Normal affect  ? ?ASSESSMENT:   ? ?1. Coronary artery disease involving native coronary artery of native heart without angina pectoris   ?2. Chronic atrial fibrillation (HCC)   ?3. Chronic anticoagulation   ?4. Other hyperlipidemia   ?5. Essential hypertension   ?6. Prostate cancer ([place])   ? ?PLAN:   ? ?In order of problems listed above: ? ?Discussed secondary prevention.  Encourage 150 minutes of moderate activity per week.  In his case this is frequently ambulation within a protected environment being  careful not to lose balance or sustain injury.  It is not appropriate for him to have to go to a gym but try to do exercises on equipment. ?Slow ventricular response will be managed by decreasing the p.m. dose of metoprolol to

## 2021-05-25 ENCOUNTER — Ambulatory Visit (INDEPENDENT_AMBULATORY_CARE_PROVIDER_SITE_OTHER): Payer: Medicare Other | Admitting: Interventional Cardiology

## 2021-05-25 ENCOUNTER — Encounter: Payer: Self-pay | Admitting: Interventional Cardiology

## 2021-05-25 VITALS — BP 122/62 | HR 50 | Ht 74.0 in | Wt 164.0 lb

## 2021-05-25 DIAGNOSIS — I2510 Atherosclerotic heart disease of native coronary artery without angina pectoris: Secondary | ICD-10-CM | POA: Diagnosis not present

## 2021-05-25 DIAGNOSIS — I482 Chronic atrial fibrillation, unspecified: Secondary | ICD-10-CM | POA: Diagnosis not present

## 2021-05-25 DIAGNOSIS — I10 Essential (primary) hypertension: Secondary | ICD-10-CM | POA: Diagnosis not present

## 2021-05-25 DIAGNOSIS — C61 Malignant neoplasm of prostate: Secondary | ICD-10-CM

## 2021-05-25 DIAGNOSIS — E7849 Other hyperlipidemia: Secondary | ICD-10-CM

## 2021-05-25 DIAGNOSIS — Z7901 Long term (current) use of anticoagulants: Secondary | ICD-10-CM

## 2021-05-25 MED ORDER — METOPROLOL SUCCINATE ER 100 MG PO TB24: ORAL_TABLET | ORAL | 3 refills | Status: DC

## 2021-05-25 NOTE — Patient Instructions (Signed)
Medication Instructions:  ?Your physician has recommended you make the following change in your medication:  ? ?1) DECREASE Toprol XL to '100mg'$  every morning and '50mg'$  every night. You will take 1 tablet in the morning and a half tablet in the evening. ? ?*If you need a refill on your cardiac medications before your next appointment, please call your pharmacy* ? ? ?Lab Work: ?NONE ? ?Testing/Procedures: ?NONE ? ?Follow-Up: ?At [facility], you and your health needs are our priority.  As part of our continuing mission to provide you with exceptional heart care, we have created designated Provider Care Teams.  These Care Teams include your primary Cardiologist (physician) and Advanced Practice Providers (APPs -  Physician Assistants and Nurse Practitioners) who all work together to provide you with the care you need, when you need it. ? ?We recommend signing up for the patient portal called "MyChart".  Sign up information is provided on this After Visit Summary.  MyChart is used to connect with patients for Virtual Visits (Telemedicine).  Patients are able to view lab/test results, encounter notes, upcoming appointments, etc.  Non-urgent messages can be sent to your provider as well.   ?To learn more about what you can do with MyChart, go to [email or web address].   ? ?Your next appointment:   ?1 year(s) ? ?The format for your next appointment:   ?In Person ? ?Provider:   ?Sinclair Grooms, MD { ? ? ?Important Information About Sugar ? ? ? ? ?  ?

## 2021-05-30 ENCOUNTER — Other Ambulatory Visit: Payer: Self-pay | Admitting: Interventional Cardiology

## 2021-05-31 MED ORDER — METOPROLOL SUCCINATE ER 100 MG PO TB24: ORAL_TABLET | ORAL | 3 refills | Status: DC

## 2021-06-02 DIAGNOSIS — Z20822 Contact with and (suspected) exposure to covid-19: Secondary | ICD-10-CM | POA: Diagnosis not present

## 2021-06-04 DIAGNOSIS — Z20822 Contact with and (suspected) exposure to covid-19: Secondary | ICD-10-CM | POA: Diagnosis not present

## 2021-06-05 DIAGNOSIS — Z20828 Contact with and (suspected) exposure to other viral communicable diseases: Secondary | ICD-10-CM | POA: Diagnosis not present

## 2021-07-04 ENCOUNTER — Ambulatory Visit (INDEPENDENT_AMBULATORY_CARE_PROVIDER_SITE_OTHER): Payer: Medicare Other | Admitting: Podiatry

## 2021-07-04 ENCOUNTER — Encounter: Payer: Self-pay | Admitting: Podiatry

## 2021-07-04 DIAGNOSIS — M2041 Other hammer toe(s) (acquired), right foot: Secondary | ICD-10-CM

## 2021-07-04 DIAGNOSIS — M2042 Other hammer toe(s) (acquired), left foot: Secondary | ICD-10-CM

## 2021-07-04 DIAGNOSIS — B351 Tinea unguium: Secondary | ICD-10-CM | POA: Diagnosis not present

## 2021-07-04 DIAGNOSIS — M79675 Pain in left toe(s): Secondary | ICD-10-CM | POA: Diagnosis not present

## 2021-07-04 DIAGNOSIS — M79674 Pain in right toe(s): Secondary | ICD-10-CM

## 2021-07-04 DIAGNOSIS — Z7901 Long term (current) use of anticoagulants: Secondary | ICD-10-CM

## 2021-07-04 NOTE — Progress Notes (Signed)
This patient returns to my office for at risk foot care.  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  This patient presents for at risk foot care today.  General Appearance  Alert, conversant and in no acute stress.  Vascular  Dorsalis pedis and posterior tibial  pulses are  weaklypalpable  bilaterally.  Capillary return is within normal limits  bilaterally. Temperature is within normal limits  bilaterally.  Neurologic  Senn-Weinstein monofilament wire test within normal limits  bilaterally. Muscle power within normal limits bilaterally.  Nails Thick disfigured discolored nails with subungual debris  from hallux to fifth toes bilaterally. No evidence of bacterial infection or drainage bilaterally.  Orthopedic  No limitations of motion  feet .  No crepitus or effusions noted.  No bony pathology or digital deformities noted.  Skin  normotropic skin with no porokeratosis noted bilaterally.  No signs of infections or ulcers noted.     Onychomycosis  Pain in right toes  Pain in left toes  Consent was obtained for treatment procedures.   Mechanical debridement of nails 1-5  bilaterally performed with a nail nipper.  Filed with dremel without incident.    Return office visit     4 months                 Told patient to return for periodic foot care and evaluation due to potential at risk complications.   Gardiner Barefoot DPM

## 2021-07-24 ENCOUNTER — Other Ambulatory Visit: Payer: Self-pay | Admitting: Interventional Cardiology

## 2021-08-01 ENCOUNTER — Other Ambulatory Visit: Payer: Self-pay | Admitting: Interventional Cardiology

## 2021-08-15 DIAGNOSIS — D485 Neoplasm of uncertain behavior of skin: Secondary | ICD-10-CM | POA: Diagnosis not present

## 2021-08-15 DIAGNOSIS — D481 Neoplasm of uncertain behavior of connective and other soft tissue: Secondary | ICD-10-CM | POA: Diagnosis not present

## 2021-08-15 DIAGNOSIS — L570 Actinic keratosis: Secondary | ICD-10-CM | POA: Diagnosis not present

## 2021-08-15 DIAGNOSIS — Z85828 Personal history of other malignant neoplasm of skin: Secondary | ICD-10-CM | POA: Diagnosis not present

## 2021-08-15 DIAGNOSIS — C44622 Squamous cell carcinoma of skin of right upper limb, including shoulder: Secondary | ICD-10-CM | POA: Diagnosis not present

## 2021-08-22 DIAGNOSIS — R634 Abnormal weight loss: Secondary | ICD-10-CM | POA: Diagnosis not present

## 2021-08-22 DIAGNOSIS — I482 Chronic atrial fibrillation, unspecified: Secondary | ICD-10-CM | POA: Diagnosis not present

## 2021-08-22 DIAGNOSIS — I10 Essential (primary) hypertension: Secondary | ICD-10-CM | POA: Diagnosis not present

## 2021-08-28 ENCOUNTER — Telehealth: Payer: Self-pay | Admitting: Interventional Cardiology

## 2021-08-28 NOTE — Telephone Encounter (Signed)
Called Robert to inquire about what he had faxed over and to confirm the fax number that he sent it to as I have not seen anything come through to our mainline. Herbie [place] was unavailable.

## 2021-08-28 NOTE — Telephone Encounter (Signed)
Robert from [employer] supplies called stating they will be faxing over some paperwork on pt to main [address] fax number

## 2021-08-29 NOTE — Telephone Encounter (Signed)
Spoke with Dr. Tamala Julian regarding fax for orthosis supplies, Dr. Tamala Julian states patient will need to request these items from PCP.  Returned call to [employer], informed them that we are a cardiology office and do not sign orders for requested orthosis supplies.  Provided Dr. Christiane Ha Stoneking's contact information (phone: [phone] and fax: [phone]). [facility] will reach out to PCP for requested supplies.

## 2021-09-06 DIAGNOSIS — L738 Other specified follicular disorders: Secondary | ICD-10-CM | POA: Diagnosis not present

## 2021-09-06 DIAGNOSIS — Z85828 Personal history of other malignant neoplasm of skin: Secondary | ICD-10-CM | POA: Diagnosis not present

## 2021-09-13 DIAGNOSIS — L02821 Furuncle of head [any part, except face]: Secondary | ICD-10-CM | POA: Diagnosis not present

## 2021-09-13 DIAGNOSIS — L0889 Other specified local infections of the skin and subcutaneous tissue: Secondary | ICD-10-CM | POA: Diagnosis not present

## 2021-09-13 DIAGNOSIS — Z85828 Personal history of other malignant neoplasm of skin: Secondary | ICD-10-CM | POA: Diagnosis not present

## 2021-09-25 DIAGNOSIS — Z85828 Personal history of other malignant neoplasm of skin: Secondary | ICD-10-CM | POA: Diagnosis not present

## 2021-09-25 DIAGNOSIS — C4442 Squamous cell carcinoma of skin of scalp and neck: Secondary | ICD-10-CM | POA: Diagnosis not present

## 2021-10-10 ENCOUNTER — Ambulatory Visit (INDEPENDENT_AMBULATORY_CARE_PROVIDER_SITE_OTHER): Payer: Medicare Other | Admitting: Podiatry

## 2021-10-10 ENCOUNTER — Encounter: Payer: Self-pay | Admitting: Podiatry

## 2021-10-10 DIAGNOSIS — B351 Tinea unguium: Secondary | ICD-10-CM

## 2021-10-10 DIAGNOSIS — Z7901 Long term (current) use of anticoagulants: Secondary | ICD-10-CM

## 2021-10-10 DIAGNOSIS — M79674 Pain in right toe(s): Secondary | ICD-10-CM | POA: Diagnosis not present

## 2021-10-10 DIAGNOSIS — M79675 Pain in left toe(s): Secondary | ICD-10-CM | POA: Diagnosis not present

## 2021-10-10 DIAGNOSIS — M2041 Other hammer toe(s) (acquired), right foot: Secondary | ICD-10-CM

## 2021-10-10 NOTE — Progress Notes (Signed)
This patient returns to my office for at risk foot care.  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  This patient presents for at risk foot care today.  General Appearance  Alert, conversant and in no acute stress.  Vascular  Dorsalis pedis and posterior tibial  pulses are  weaklypalpable  bilaterally.  Capillary return is within normal limits  bilaterally. Temperature is within normal limits  bilaterally.  Neurologic  Senn-Weinstein monofilament wire test within normal limits  bilaterally. Muscle power within normal limits bilaterally.  Nails Thick disfigured discolored nails with subungual debris  from hallux to fifth toes bilaterally. No evidence of bacterial infection or drainage bilaterally.  Orthopedic  No limitations of motion  feet .  No crepitus or effusions noted.  No bony pathology or digital deformities noted.  Skin  normotropic skin with no porokeratosis noted bilaterally.  No signs of infections or ulcers noted.     Onychomycosis  Pain in right toes  Pain in left toes  Consent was obtained for treatment procedures.   Mechanical debridement of nails 1-5  bilaterally performed with a nail nipper.  Filed with dremel without incident.    Return office visit     3   months                 Told patient to return for periodic foot care and evaluation due to potential at risk complications.   Gardiner Barefoot DPM

## 2021-10-12 DIAGNOSIS — I10 Essential (primary) hypertension: Secondary | ICD-10-CM | POA: Diagnosis not present

## 2021-10-12 DIAGNOSIS — Z23 Encounter for immunization: Secondary | ICD-10-CM | POA: Diagnosis not present

## 2021-10-23 DIAGNOSIS — D485 Neoplasm of uncertain behavior of skin: Secondary | ICD-10-CM | POA: Diagnosis not present

## 2021-10-23 DIAGNOSIS — Z85828 Personal history of other malignant neoplasm of skin: Secondary | ICD-10-CM | POA: Diagnosis not present

## 2021-10-23 DIAGNOSIS — L570 Actinic keratosis: Secondary | ICD-10-CM | POA: Diagnosis not present

## 2021-10-23 DIAGNOSIS — C4440 Unspecified malignant neoplasm of skin of scalp and neck: Secondary | ICD-10-CM | POA: Diagnosis not present

## 2021-11-06 DIAGNOSIS — H6121 Impacted cerumen, right ear: Secondary | ICD-10-CM | POA: Diagnosis not present

## 2021-11-15 DIAGNOSIS — D485 Neoplasm of uncertain behavior of skin: Secondary | ICD-10-CM | POA: Diagnosis not present

## 2021-11-15 DIAGNOSIS — C4442 Squamous cell carcinoma of skin of scalp and neck: Secondary | ICD-10-CM | POA: Diagnosis not present

## 2021-11-15 DIAGNOSIS — C44329 Squamous cell carcinoma of skin of other parts of face: Secondary | ICD-10-CM | POA: Diagnosis not present

## 2021-11-15 DIAGNOSIS — Z85828 Personal history of other malignant neoplasm of skin: Secondary | ICD-10-CM | POA: Diagnosis not present

## 2021-12-13 DIAGNOSIS — Z85828 Personal history of other malignant neoplasm of skin: Secondary | ICD-10-CM | POA: Diagnosis not present

## 2021-12-13 DIAGNOSIS — C4442 Squamous cell carcinoma of skin of scalp and neck: Secondary | ICD-10-CM | POA: Diagnosis not present

## 2021-12-21 ENCOUNTER — Emergency Department (HOSPITAL_COMMUNITY)
Admission: EM | Admit: 2021-12-21 | Discharge: 2021-12-21 | Disposition: A | Discharge status: Home / Self Care | Payer: Medicare Other | Attending: Emergency Medicine | Admitting: Emergency Medicine

## 2021-12-21 ENCOUNTER — Emergency Department (HOSPITAL_COMMUNITY): Payer: Medicare Other

## 2021-12-21 ENCOUNTER — Other Ambulatory Visit: Payer: Self-pay

## 2021-12-21 DIAGNOSIS — S80212A Abrasion, left knee, initial encounter: Secondary | ICD-10-CM | POA: Diagnosis not present

## 2021-12-21 DIAGNOSIS — M25552 Pain in left hip: Secondary | ICD-10-CM | POA: Insufficient documentation

## 2021-12-21 DIAGNOSIS — S199XXA Unspecified injury of neck, initial encounter: Secondary | ICD-10-CM | POA: Diagnosis not present

## 2021-12-21 DIAGNOSIS — S0990XA Unspecified injury of head, initial encounter: Secondary | ICD-10-CM | POA: Diagnosis not present

## 2021-12-21 DIAGNOSIS — S51012A Laceration without foreign body of left elbow, initial encounter: Secondary | ICD-10-CM | POA: Diagnosis not present

## 2021-12-21 DIAGNOSIS — W19XXXA Unspecified fall, initial encounter: Secondary | ICD-10-CM | POA: Insufficient documentation

## 2021-12-21 DIAGNOSIS — Z7901 Long term (current) use of anticoagulants: Secondary | ICD-10-CM | POA: Insufficient documentation

## 2021-12-21 DIAGNOSIS — I6782 Cerebral ischemia: Secondary | ICD-10-CM | POA: Diagnosis not present

## 2021-12-21 DIAGNOSIS — M47812 Spondylosis without myelopathy or radiculopathy, cervical region: Secondary | ICD-10-CM | POA: Diagnosis not present

## 2021-12-21 DIAGNOSIS — M25522 Pain in left elbow: Secondary | ICD-10-CM | POA: Diagnosis not present

## 2021-12-21 DIAGNOSIS — Z043 Encounter for examination and observation following other accident: Secondary | ICD-10-CM | POA: Diagnosis not present

## 2021-12-21 DIAGNOSIS — M1712 Unilateral primary osteoarthritis, left knee: Secondary | ICD-10-CM | POA: Diagnosis not present

## 2021-12-21 DIAGNOSIS — F0390 Unspecified dementia without behavioral disturbance: Secondary | ICD-10-CM | POA: Insufficient documentation

## 2021-12-21 DIAGNOSIS — Z23 Encounter for immunization: Secondary | ICD-10-CM | POA: Diagnosis not present

## 2021-12-21 DIAGNOSIS — Z79899 Other long term (current) drug therapy: Secondary | ICD-10-CM | POA: Diagnosis not present

## 2021-12-21 DIAGNOSIS — G319 Degenerative disease of nervous system, unspecified: Secondary | ICD-10-CM | POA: Diagnosis not present

## 2021-12-21 DIAGNOSIS — I6529 Occlusion and stenosis of unspecified carotid artery: Secondary | ICD-10-CM | POA: Diagnosis not present

## 2021-12-21 DIAGNOSIS — S59902A Unspecified injury of left elbow, initial encounter: Secondary | ICD-10-CM | POA: Diagnosis present

## 2021-12-21 MED ORDER — RAMIPRIL 5 MG PO CAPS
10.0000 mg | ORAL_CAPSULE | ORAL | Status: AC
  Administered 2021-12-21: 10 mg via ORAL
  Filled 2021-12-21: qty 2

## 2021-12-21 MED ORDER — TETANUS-DIPHTH-ACELL PERTUSSIS 5-2.5-18.5 LF-MCG/0.5 IM SUSY
0.5000 mL | PREFILLED_SYRINGE | Freq: Once | INTRAMUSCULAR | Status: AC
  Administered 2021-12-21: 0.5 mL via INTRAMUSCULAR
  Filled 2021-12-21: qty 0.5

## 2021-12-21 MED ORDER — AMLODIPINE BESYLATE 5 MG PO TABS
10.0000 mg | ORAL_TABLET | ORAL | Status: AC
  Administered 2021-12-21: 10 mg via ORAL
  Filled 2021-12-21: qty 2

## 2021-12-21 MED ORDER — LIDOCAINE-EPINEPHRINE (PF) 2 %-1:200000 IJ SOLN
10.0000 mL | Freq: Once | INTRAMUSCULAR | Status: AC
  Administered 2021-12-21: 10 mL
  Filled 2021-12-21: qty 20

## 2021-12-21 NOTE — Discharge Instructions (Addendum)
You were seen for your fall and had your tetanus shot updated and the cut to your elbow stitched.   At home, please please keep the stitches dry for 24 hours.  You may shower after that.  Do not submerge your wound until your stitches are removed in 2 weeks.   Check your MyChart online for the results of any tests that had not resulted by the time you left the emergency department.   Follow-up with your primary doctor or urgent care in 2 weeks to have your stitches removed.   Return immediately to the emergency department if you experience any of the following: confusion, drainage from your elbow, redness, fever, or any other concerning symptoms.    Thank you for visiting our Emergency Department. It was a pleasure taking care of you today.

## 2021-12-21 NOTE — ED Provider Notes (Signed)
[facility] DEPT Provider Note   CSN: [id number] Arrival date & time: 12/21/21  1014     History  Chief Complaint  Patient presents with   Jacob Becker is a 88 y.o. male.  88 year old male with mild dementia and atrial fibrillation on Xarelto who presents to the emergency department after a fall.  History limited somewhat due to dementia patient states that he was getting his wife out of the passenger seat of the car after coming home from Thanksgiving last night and he fell onto his right side.  Says that he struck his elbow and knee.  Unsure of head strike.  Denies any LOC.  Was able to get himself up and get him and his wife to bed.  Family says he has been ambulatory since but noticed blood on the garage floor today and decided to bring him in for evaluation.  Has not yet taken his morning medications.  Did notice a laceration of his elbow.  Not having any headache at this time.       Home Medications Prior to Admission medications   Medication Sig Start Date End Date Taking? Authorizing Provider  amLODipine (NORVASC) 10 MG tablet  08/25/18   [provider]  Calcium-Magnesium-Vitamin D (CALCIUM 500 PO) Take 500 mg by mouth 2 (two) times daily.    [provider]  Coenzyme Q10 (CO Q 10) 100 MG CAPS Take 100 mg by mouth daily.    [provider]  colesevelam (WELCHOL) 625 MG tablet Take 625 mg by mouth 2 (two) times daily with a meal.    [provider]  diphenhydramine-acetaminophen (TYLENOL PM) 25-500 MG TABS tablet Take 2 tablets by mouth at bedtime as needed.    [provider]  doxycycline (VIBRAMYCIN) 100 MG capsule Take 100 mg by mouth 2 (two) times daily. 03/02/20   [provider]  enzalutamide Gillermina Phy) 40 MG capsule Take 160 mg by mouth daily.    [provider]  ferrous sulfate 325 (65 FE) MG tablet Take 1 tablet (325 mg total) by mouth daily with breakfast. 06/24/17    Belva Crome, MD  imiquimod (ALDARA) 5 % cream APPLY TO SCALP AND EAR NIGHTLY MONDAY THRU FRIDAY FOR 4 WEEKS 03/30/19   [provider]  metoprolol succinate (TOPROL-XL) 100 MG 24 hr tablet Take 1 tablet ('100mg'$ ) every morning and 0.5 tablet ('50mg'$ ) every evening) Take with or immediately following a meal. 05/31/21   Belva Crome, MD  Multiple Vitamin (MULTIVITAMIN) capsule Take 1 capsule by mouth daily.    [provider]  nitroGLYCERIN (NITROSTAT) 0.4 MG SL tablet ONE TABLET UNDER TONGUE EVERY 5 MINUTES AS NEEDED FOR CHEST PAIN 12/25/15   Belva Crome, MD  NON FORMULARY Antifungal toenail solution from [facility], order faxed 09/08/2018 [facility]    [provider]  Omega-3 Fatty Acids (FISH OIL PO) Take 1,000 mg by mouth daily.    [provider]  ramipril (ALTACE) 10 MG capsule Take 1 capsule (10 mg total) by mouth 2 (two) times daily. 08/01/21   Belva Crome, MD  rosuvastatin (CRESTOR) 40 MG tablet Take 40 mg by mouth daily.    [provider]  triamcinolone (KENALOG) 0.1 % Apply topically 2 (two) times daily. 03/27/20   [provider]  triamterene-hydrochlorothiazide (DYAZIDE) 37.5-25 MG capsule TAKE ONE CAPSULE BY MOUTH EVERY DAY **NEED OFFICE VISIT** 07/24/21   Belva Crome, MD  XARELTO 20 MG  TABS tablet TAKE ONE TABLET EACH DAY 10/18/14   Belva Crome, MD      Allergies    Patient has no known allergies.    Review of Systems   Review of Systems  Physical Exam Updated Vital Signs BP (!) 181/102   Pulse 81   Temp (!) 97.5 F (36.4 C) (Oral)   Resp 14   Ht 6' (1.829 m)   Wt 70.3 kg   SpO2 98%   BMI 21.02 kg/m  Physical Exam Vitals and nursing note reviewed.  Constitutional:      General: He is not in acute distress.    Appearance: He is well-developed.  HENT:     Head: Normocephalic and atraumatic.     Right Ear: External ear normal.     Left Ear: External ear normal.     Nose: Nose normal.  Eyes:      Extraocular Movements: Extraocular movements intact.     Conjunctiva/sclera: Conjunctivae normal.     Pupils: Pupils are equal, round, and reactive to light.  Neck:     Comments: No midline C-spine tenderness to palpation Cardiovascular:     Rate and Rhythm: Normal rate. Rhythm irregular.  Pulmonary:     Effort: Pulmonary effort is normal. No respiratory distress.  Abdominal:     General: There is no distension.     Palpations: Abdomen is soft. There is no mass.     Tenderness: There is no abdominal tenderness. There is no guarding.  Musculoskeletal:        General: No swelling.     Cervical back: Normal range of motion and neck supple.     Right lower leg: No edema.     Left lower leg: No edema.     Comments: Left hip tenderness to palpation.  Pelvis stable.  Scattered abrasions of left knee.  4 cm curvilinear laceration of the left elbow without joint involvement.  Skin:    General: Skin is warm and dry.     Capillary Refill: Capillary refill takes less than 2 seconds.  Neurological:     Mental Status: He is alert. Mental status is at baseline.     Sensory: No sensory deficit.     Motor: No weakness.  Psychiatric:        Mood and Affect: Mood normal.        Behavior: Behavior normal.     ED Results / Procedures / Treatments   Labs (all labs ordered are listed, but only abnormal results are displayed) Labs Reviewed - No data to display  EKG None  Radiology DG Knee Complete 4 Views Left  Result Date: 12/21/2021 CLINICAL DATA:  Fall onto left side. EXAM: LEFT KNEE - COMPLETE 4+ VIEW COMPARISON:  None Available. FINDINGS: Minimal tricompartmental osteoarthritic changes present. Mild chondrocalcinosis over the medial and lateral compartments. No significant joint effusion. No acute fracture or dislocation. Peripheral atherosclerotic disease is present. IMPRESSION: 1. No acute findings. 2. Minimal osteoarthritic change. Electronically Signed   By: Marin Olp M.D.   On:  12/21/2021 11:52   DG Hip Unilat W or Wo Pelvis 2-3 Views Left  Result Date: 12/21/2021 CLINICAL DATA:  Fall onto left side. Left hip pain. Known bone metastases from prostate cancer. EXAM: DG HIP (WITH OR WITHOUT PELVIS) 2-3V LEFT COMPARISON:  CT 01/19/2021 FINDINGS: Exam demonstrates mild symmetric osteoarthritic change of the hips. There is patchy sclerosis over the pelvic bones likely due to patient's known metastatic disease. No acute fracture or  dislocation. Minimal degenerative changes of the symphysis pubis joint, sacroiliac joints and spine. IMPRESSION: 1. No acute fracture or dislocation. 2. Patchy sclerosis over the pelvic bones likely due to patient's known metastatic disease. Electronically Signed   By: Marin Olp M.D.   On: 12/21/2021 11:50   DG Elbow Complete Left  Result Date: 12/21/2021 CLINICAL DATA:  Fall onto left side with left elbow pain. EXAM: LEFT ELBOW - COMPLETE 3+ VIEW COMPARISON:  None Available. FINDINGS: Minimal degenerative changes over the elbow joint. No definite acute fracture or dislocation. Soft tissues are unremarkable. IMPRESSION: No acute findings. Electronically Signed   By: Marin Olp M.D.   On: 12/21/2021 11:46   CT Head Wo Contrast  Result Date: 12/21/2021 CLINICAL DATA:  88 year old male with head and neck injury from fall yesterday. Initial encounter. EXAM: CT HEAD WITHOUT CONTRAST CT CERVICAL SPINE WITHOUT CONTRAST TECHNIQUE: Multidetector CT imaging of the head and cervical spine was performed following the standard protocol without intravenous contrast. Multiplanar CT image reconstructions of the cervical spine were also generated. RADIATION DOSE REDUCTION: This exam was performed according to the departmental dose-optimization program which includes automated exposure control, adjustment of the mA and/or kV according to patient size and/or use of iterative reconstruction technique. COMPARISON:  None Available. FINDINGS: CT HEAD FINDINGS Brain: No  evidence of acute infarction, hemorrhage, hydrocephalus, extra-axial collection or mass lesion/mass effect. Atrophy and chronic small-vessel white matter ischemic changes noted. Vascular: Carotid atherosclerotic calcifications are noted. Skull: Normal. Negative for fracture or focal lesion. Sinuses/Orbits: No acute finding. Other: None. CT CERVICAL SPINE FINDINGS Alignment: 2-3 mm spondylolisthesis at C4-5, C5-6, C6-7 and C7-T1 are likely degenerative. Skull base and vertebrae: No acute fracture. No primary bone lesion or focal pathologic process. Soft tissues and spinal canal: No prevertebral fluid or swelling. No visible canal hematoma. Disc levels: Moderate to severe multilevel degenerative disc disease, spondylosis and facet arthropathy noted with facet arthropathy greatest in the UPPER cervical spine and degenerative disc disease/spondylosis greatest at C5-6 and C6-C7. These degenerative findings contribute to mild central spinal and moderate bony foraminal narrowing at multiple levels. Upper chest: Negative. Other: None IMPRESSION: 1. No evidence of acute intracranial abnormality. Atrophy and chronic small-vessel white matter ischemic changes. 2. No static evidence of acute injury to the cervical spine. Multilevel degenerative changes as described. Electronically Signed   By: Margarette Canada M.D.   On: 12/21/2021 11:27   CT Cervical Spine Wo Contrast  Result Date: 12/21/2021 CLINICAL DATA:  88 year old male with head and neck injury from fall yesterday. Initial encounter. EXAM: CT HEAD WITHOUT CONTRAST CT CERVICAL SPINE WITHOUT CONTRAST TECHNIQUE: Multidetector CT imaging of the head and cervical spine was performed following the standard protocol without intravenous contrast. Multiplanar CT image reconstructions of the cervical spine were also generated. RADIATION DOSE REDUCTION: This exam was performed according to the departmental dose-optimization program which includes automated exposure control,  adjustment of the mA and/or kV according to patient size and/or use of iterative reconstruction technique. COMPARISON:  None Available. FINDINGS: CT HEAD FINDINGS Brain: No evidence of acute infarction, hemorrhage, hydrocephalus, extra-axial collection or mass lesion/mass effect. Atrophy and chronic small-vessel white matter ischemic changes noted. Vascular: Carotid atherosclerotic calcifications are noted. Skull: Normal. Negative for fracture or focal lesion. Sinuses/Orbits: No acute finding. Other: None. CT CERVICAL SPINE FINDINGS Alignment: 2-3 mm spondylolisthesis at C4-5, C5-6, C6-7 and C7-T1 are likely degenerative. Skull base and vertebrae: No acute fracture. No primary bone lesion or focal pathologic process. Soft tissues and spinal  canal: No prevertebral fluid or swelling. No visible canal hematoma. Disc levels: Moderate to severe multilevel degenerative disc disease, spondylosis and facet arthropathy noted with facet arthropathy greatest in the UPPER cervical spine and degenerative disc disease/spondylosis greatest at C5-6 and C6-C7. These degenerative findings contribute to mild central spinal and moderate bony foraminal narrowing at multiple levels. Upper chest: Negative. Other: None IMPRESSION: 1. No evidence of acute intracranial abnormality. Atrophy and chronic small-vessel white matter ischemic changes. 2. No static evidence of acute injury to the cervical spine. Multilevel degenerative changes as described. Electronically Signed   By: Margarette Canada M.D.   On: 12/21/2021 11:27    Procedures .Marland KitchenLaceration Repair  Date/Time: 12/22/2021 5:54 PM  Performed by: Fransico Meadow, MD Authorized by: Fransico Meadow, MD   Universal protocol:    Imaging studies available: yes   Laceration details:    Location:  Shoulder/arm   Shoulder/arm location:  L elbow   Length (cm):  4 Pre-procedure details:    Preparation:  Patient was prepped and draped in usual sterile fashion and imaging obtained to  evaluate for foreign bodies Exploration:    Imaging obtained: x-ray     Imaging outcome: foreign body not noted     Wound exploration: wound explored through full range of motion and entire depth of wound visualized     Wound extent: areolar tissue not violated, fascia not violated and no tendon damage     Wound extent comment:  No joint capsule violation Treatment:    Area cleansed with:  Saline   Amount of cleaning:  Standard   Irrigation solution:  Sterile saline   Irrigation volume:  300   Irrigation method:  Pressure wash Skin repair:    Repair method:  Sutures   Suture size:  3-0   Suture material:  Prolene   Suture technique:  Simple interrupted   Number of sutures:  3 Approximation:    Approximation:  Close Repair type:    Repair type:  Simple Post-procedure details:    Dressing:  Non-adherent dressing   Procedure completion:  Tolerated well, no immediate complications    Medications Ordered in ED Medications  Tdap (BOOSTRIX) injection 0.5 mL (0.5 mLs Intramuscular Given 12/21/21 1143)  lidocaine-EPINEPHrine (XYLOCAINE W/EPI) 2 %-1:200000 (PF) injection 10 mL (10 mLs Infiltration Given by Other 12/21/21 1145)  amLODipine (NORVASC) tablet 10 mg (10 mg Oral Given 12/21/21 1142)  ramipril (ALTACE) capsule 10 mg (10 mg Oral Given 12/21/21 1141)    ED Course/ Medical Decision Making/ A&P Clinical Course as of 12/22/21 1759  Fri Dec 21, 2021  1159 X-ray showed patchy sclerosis over L hip due to metastatic disease. [RP]    Clinical Course User Index [RP] Fransico Meadow, MD                           Medical Decision Making Amount and/or Complexity of Data Reviewed Radiology: ordered.  Risk Prescription drug management.   Jacob Becker is a 89 y.o. male with comorbidities that complicate the patient evaluation including dementia and AF on xarelto who presents with chief complaint of fall and elbow pain   Initial Ddx:  Elbow fracture, knee fracture, hip  fracture, laceration, ICH/c-spine injury  MDM:  Given patient's age concern for possible ICH or C-spine injury specially since he is on anticoagulation.  Does have scattered abrasions on the left side of the body so we will obtain x-rays to evaluate for  fracture though feel that this is less likely since he has been walking and has full range of motion of all of his joints.  We will update his tetanus shot today and repair his laceration as well.  Plan:  CT head and C-spine X-rays Tdap Laceration repair  ED Summary/Re-evaluation:  Patient's imaging did not reveal any acute abnormalities.  Had his laceration repaired with nonabsorbable sutures and was instructed to have them removed in 2 weeks.  Return precautions discussed prior to discharge.  This patient presents to the ED for concern of complaints listed in HPI, this involves an extensive number of treatment options, and is a complaint that carries with it a high risk of complications and morbidity. Disposition including potential need for admission considered.   Dispo: DC Home. Return precautions discussed including, but not limited to, those listed in the AVS. Allowed pt time to ask questions which were answered fully prior to dc.  Additional history obtained from daughter Records reviewed Outpatient Clinic Notes I independently reviewed the following imaging with scope of interpretation limited to determining acute life threatening conditions related to emergency care: Extremity x-ray(s) and CT Head, which revealed no acute abnormality  Social Determinants of health:  Elderly with dementia  Final Clinical Impression(s) / ED Diagnoses Final diagnoses:  Fall, initial encounter  Laceration of left elbow, initial encounter    Rx / DC Orders ED Discharge Orders     None         Fransico Meadow, MD 12/22/21 1759

## 2021-12-21 NOTE — ED Triage Notes (Signed)
Pt fell in garage last night hitting elbows and knees, laceration to left elbow, bruising noted on left knee. Denies LOC .Able to get self up.

## 2021-12-28 ENCOUNTER — Emergency Department (HOSPITAL_COMMUNITY): Payer: Medicare Other

## 2021-12-28 ENCOUNTER — Other Ambulatory Visit: Payer: Self-pay

## 2021-12-28 ENCOUNTER — Inpatient Hospital Stay (HOSPITAL_COMMUNITY): Payer: Medicare Other

## 2021-12-28 ENCOUNTER — Encounter (HOSPITAL_COMMUNITY): Payer: Self-pay

## 2021-12-28 ENCOUNTER — Inpatient Hospital Stay (HOSPITAL_COMMUNITY)
Admission: EM | Admit: 2021-12-28 | Discharge: 2022-01-10 | DRG: 963 | Disposition: A | Discharge status: Skilled Nursing Facility | Payer: Medicare Other | Attending: Internal Medicine | Admitting: Internal Medicine

## 2021-12-28 DIAGNOSIS — F0280 Dementia in other diseases classified elsewhere without behavioral disturbance: Secondary | ICD-10-CM | POA: Diagnosis present

## 2021-12-28 DIAGNOSIS — M6281 Muscle weakness (generalized): Secondary | ICD-10-CM | POA: Diagnosis not present

## 2021-12-28 DIAGNOSIS — T68XXXA Hypothermia, initial encounter: Secondary | ICD-10-CM | POA: Diagnosis not present

## 2021-12-28 DIAGNOSIS — R338 Other retention of urine: Secondary | ICD-10-CM | POA: Diagnosis present

## 2021-12-28 DIAGNOSIS — N390 Urinary tract infection, site not specified: Secondary | ICD-10-CM | POA: Diagnosis present

## 2021-12-28 DIAGNOSIS — F0284 Dementia in other diseases classified elsewhere, unspecified severity, with anxiety: Secondary | ICD-10-CM | POA: Diagnosis not present

## 2021-12-28 DIAGNOSIS — Z4682 Encounter for fitting and adjustment of non-vascular catheter: Secondary | ICD-10-CM | POA: Diagnosis not present

## 2021-12-28 DIAGNOSIS — S2239XA Fracture of one rib, unspecified side, initial encounter for closed fracture: Secondary | ICD-10-CM | POA: Diagnosis not present

## 2021-12-28 DIAGNOSIS — S272XXA Traumatic hemopneumothorax, initial encounter: Principal | ICD-10-CM | POA: Diagnosis present

## 2021-12-28 DIAGNOSIS — B9620 Unspecified Escherichia coli [E. coli] as the cause of diseases classified elsewhere: Secondary | ICD-10-CM | POA: Diagnosis present

## 2021-12-28 DIAGNOSIS — S80211A Abrasion, right knee, initial encounter: Secondary | ICD-10-CM | POA: Diagnosis present

## 2021-12-28 DIAGNOSIS — Z8546 Personal history of malignant neoplasm of prostate: Secondary | ICD-10-CM

## 2021-12-28 DIAGNOSIS — K7689 Other specified diseases of liver: Secondary | ICD-10-CM | POA: Diagnosis present

## 2021-12-28 DIAGNOSIS — N179 Acute kidney failure, unspecified: Secondary | ICD-10-CM | POA: Diagnosis present

## 2021-12-28 DIAGNOSIS — E872 Acidosis, unspecified: Secondary | ICD-10-CM | POA: Diagnosis present

## 2021-12-28 DIAGNOSIS — I959 Hypotension, unspecified: Secondary | ICD-10-CM | POA: Diagnosis present

## 2021-12-28 DIAGNOSIS — R41841 Cognitive communication deficit: Secondary | ICD-10-CM | POA: Diagnosis not present

## 2021-12-28 DIAGNOSIS — D62 Acute posthemorrhagic anemia: Secondary | ICD-10-CM | POA: Diagnosis present

## 2021-12-28 DIAGNOSIS — T1490XA Injury, unspecified, initial encounter: Secondary | ICD-10-CM | POA: Diagnosis not present

## 2021-12-28 DIAGNOSIS — I482 Chronic atrial fibrillation, unspecified: Secondary | ICD-10-CM | POA: Diagnosis present

## 2021-12-28 DIAGNOSIS — Y92003 Bedroom of unspecified non-institutional (private) residence as the place of occurrence of the external cause: Secondary | ICD-10-CM

## 2021-12-28 DIAGNOSIS — J939 Pneumothorax, unspecified: Secondary | ICD-10-CM | POA: Diagnosis not present

## 2021-12-28 DIAGNOSIS — E440 Moderate protein-calorie malnutrition: Secondary | ICD-10-CM | POA: Diagnosis present

## 2021-12-28 DIAGNOSIS — C61 Malignant neoplasm of prostate: Secondary | ICD-10-CM | POA: Diagnosis not present

## 2021-12-28 DIAGNOSIS — Z79899 Other long term (current) drug therapy: Secondary | ICD-10-CM

## 2021-12-28 DIAGNOSIS — I2510 Atherosclerotic heart disease of native coronary artery without angina pectoris: Secondary | ICD-10-CM | POA: Diagnosis present

## 2021-12-28 DIAGNOSIS — E785 Hyperlipidemia, unspecified: Secondary | ICD-10-CM | POA: Diagnosis present

## 2021-12-28 DIAGNOSIS — I4891 Unspecified atrial fibrillation: Secondary | ICD-10-CM | POA: Diagnosis not present

## 2021-12-28 DIAGNOSIS — I10 Essential (primary) hypertension: Secondary | ICD-10-CM | POA: Diagnosis present

## 2021-12-28 DIAGNOSIS — S2241XA Multiple fractures of ribs, right side, initial encounter for closed fracture: Secondary | ICD-10-CM | POA: Diagnosis present

## 2021-12-28 DIAGNOSIS — W010XXA Fall on same level from slipping, tripping and stumbling without subsequent striking against object, initial encounter: Secondary | ICD-10-CM | POA: Diagnosis present

## 2021-12-28 DIAGNOSIS — N35912 Unspecified bulbous urethral stricture, male: Secondary | ICD-10-CM | POA: Diagnosis not present

## 2021-12-28 DIAGNOSIS — S32434S Nondisplaced fracture of anterior column [iliopubic] of right acetabulum, sequela: Secondary | ICD-10-CM | POA: Diagnosis not present

## 2021-12-28 DIAGNOSIS — K862 Cyst of pancreas: Secondary | ICD-10-CM | POA: Diagnosis present

## 2021-12-28 DIAGNOSIS — E875 Hyperkalemia: Secondary | ICD-10-CM | POA: Diagnosis present

## 2021-12-28 DIAGNOSIS — M7989 Other specified soft tissue disorders: Secondary | ICD-10-CM | POA: Diagnosis not present

## 2021-12-28 DIAGNOSIS — R079 Chest pain, unspecified: Secondary | ICD-10-CM | POA: Diagnosis not present

## 2021-12-28 DIAGNOSIS — R42 Dizziness and giddiness: Secondary | ICD-10-CM | POA: Diagnosis not present

## 2021-12-28 DIAGNOSIS — R609 Edema, unspecified: Secondary | ICD-10-CM | POA: Diagnosis not present

## 2021-12-28 DIAGNOSIS — S270XXA Traumatic pneumothorax, initial encounter: Secondary | ICD-10-CM | POA: Diagnosis not present

## 2021-12-28 DIAGNOSIS — W19XXXA Unspecified fall, initial encounter: Secondary | ICD-10-CM

## 2021-12-28 DIAGNOSIS — S32414A Nondisplaced fracture of anterior wall of right acetabulum, initial encounter for closed fracture: Secondary | ICD-10-CM | POA: Diagnosis present

## 2021-12-28 DIAGNOSIS — Z515 Encounter for palliative care: Secondary | ICD-10-CM | POA: Diagnosis not present

## 2021-12-28 DIAGNOSIS — R55 Syncope and collapse: Secondary | ICD-10-CM | POA: Diagnosis not present

## 2021-12-28 DIAGNOSIS — N319 Neuromuscular dysfunction of bladder, unspecified: Secondary | ICD-10-CM | POA: Diagnosis not present

## 2021-12-28 DIAGNOSIS — R2689 Other abnormalities of gait and mobility: Secondary | ICD-10-CM | POA: Diagnosis not present

## 2021-12-28 DIAGNOSIS — R0689 Other abnormalities of breathing: Secondary | ICD-10-CM | POA: Diagnosis not present

## 2021-12-28 DIAGNOSIS — E860 Dehydration: Secondary | ICD-10-CM | POA: Diagnosis present

## 2021-12-28 DIAGNOSIS — Z741 Need for assistance with personal care: Secondary | ICD-10-CM | POA: Diagnosis not present

## 2021-12-28 DIAGNOSIS — S3991XA Unspecified injury of abdomen, initial encounter: Secondary | ICD-10-CM | POA: Diagnosis not present

## 2021-12-28 DIAGNOSIS — M6259 Muscle wasting and atrophy, not elsewhere classified, multiple sites: Secondary | ICD-10-CM | POA: Diagnosis not present

## 2021-12-28 DIAGNOSIS — I6782 Cerebral ischemia: Secondary | ICD-10-CM | POA: Diagnosis not present

## 2021-12-28 DIAGNOSIS — M7022 Olecranon bursitis, left elbow: Secondary | ICD-10-CM

## 2021-12-28 DIAGNOSIS — G9341 Metabolic encephalopathy: Secondary | ICD-10-CM | POA: Diagnosis present

## 2021-12-28 DIAGNOSIS — G309 Alzheimer's disease, unspecified: Secondary | ICD-10-CM | POA: Diagnosis present

## 2021-12-28 DIAGNOSIS — M71122 Other infective bursitis, left elbow: Secondary | ICD-10-CM | POA: Diagnosis not present

## 2021-12-28 DIAGNOSIS — M71022 Abscess of bursa, left elbow: Secondary | ICD-10-CM | POA: Diagnosis not present

## 2021-12-28 DIAGNOSIS — Z6821 Body mass index (BMI) 21.0-21.9, adult: Secondary | ICD-10-CM

## 2021-12-28 DIAGNOSIS — G301 Alzheimer's disease with late onset: Secondary | ICD-10-CM | POA: Diagnosis not present

## 2021-12-28 DIAGNOSIS — T07XXXA Unspecified multiple injuries, initial encounter: Secondary | ICD-10-CM | POA: Diagnosis not present

## 2021-12-28 DIAGNOSIS — S271XXA Traumatic hemothorax, initial encounter: Secondary | ICD-10-CM | POA: Diagnosis not present

## 2021-12-28 DIAGNOSIS — S80212A Abrasion, left knee, initial encounter: Secondary | ICD-10-CM | POA: Diagnosis present

## 2021-12-28 DIAGNOSIS — A4902 Methicillin resistant Staphylococcus aureus infection, unspecified site: Secondary | ICD-10-CM | POA: Diagnosis not present

## 2021-12-28 DIAGNOSIS — I4811 Longstanding persistent atrial fibrillation: Secondary | ICD-10-CM | POA: Diagnosis not present

## 2021-12-28 DIAGNOSIS — Z79890 Hormone replacement therapy (postmenopausal): Secondary | ICD-10-CM

## 2021-12-28 DIAGNOSIS — J942 Hemothorax: Secondary | ICD-10-CM

## 2021-12-28 DIAGNOSIS — J9811 Atelectasis: Secondary | ICD-10-CM | POA: Diagnosis not present

## 2021-12-28 DIAGNOSIS — Z7901 Long term (current) use of anticoagulants: Secondary | ICD-10-CM

## 2021-12-28 LAB — URINALYSIS, ROUTINE W REFLEX MICROSCOPIC
Bilirubin Urine: NEGATIVE
Glucose, UA: NEGATIVE mg/dL
Ketones, ur: NEGATIVE mg/dL
Nitrite: NEGATIVE
Protein, ur: 100 mg/dL — AB
Specific Gravity, Urine: 1.012 (ref 1.005–1.030)
pH: 7 (ref 5.0–8.0)

## 2021-12-28 LAB — I-STAT CHEM 8, ED
BUN: 42 mg/dL — ABNORMAL HIGH (ref 8–23)
Calcium, Ion: 1.12 mmol/L — ABNORMAL LOW (ref 1.15–1.40)
Chloride: 114 mmol/L — ABNORMAL HIGH (ref 98–111)
Creatinine, Ser: 1.9 mg/dL — ABNORMAL HIGH (ref 0.61–1.24)
Glucose, Bld: 210 mg/dL — ABNORMAL HIGH (ref 70–99)
HCT: 23 % — ABNORMAL LOW (ref 39.0–52.0)
Hemoglobin: 7.8 g/dL — ABNORMAL LOW (ref 13.0–17.0)
Potassium: 5.7 mmol/L — ABNORMAL HIGH (ref 3.5–5.1)
Sodium: 136 mmol/L (ref 135–145)
TCO2: 19 mmol/L — ABNORMAL LOW (ref 22–32)

## 2021-12-28 LAB — HEMOGLOBIN AND HEMATOCRIT, BLOOD
HCT: 25.7 % — ABNORMAL LOW (ref 39.0–52.0)
HCT: 24.9 % — ABNORMAL LOW (ref 39.0–52.0)
Hemoglobin: 8.6 g/dL — ABNORMAL LOW (ref 13.0–17.0)
Hemoglobin: 8.6 g/dL — ABNORMAL LOW (ref 13.0–17.0)

## 2021-12-28 LAB — CBC
HCT: 25.5 % — ABNORMAL LOW (ref 39.0–52.0)
Hemoglobin: 8.2 g/dL — ABNORMAL LOW (ref 13.0–17.0)
MCH: 32.4 pg (ref 26.0–34.0)
MCHC: 32.2 g/dL (ref 30.0–36.0)
MCV: 100.8 fL — ABNORMAL HIGH (ref 80.0–100.0)
Platelets: 159 10*3/uL (ref 150–400)
RBC: 2.53 MIL/uL — ABNORMAL LOW (ref 4.22–5.81)
RDW: 13.5 % (ref 11.5–15.5)
WBC: 12.2 10*3/uL — ABNORMAL HIGH (ref 4.0–10.5)
nRBC: 0 % (ref 0.0–0.2)

## 2021-12-28 LAB — BASIC METABOLIC PANEL
Anion gap: 15 (ref 5–15)
BUN: 48 mg/dL — ABNORMAL HIGH (ref 8–23)
CO2: 21 mmol/L — ABNORMAL LOW (ref 22–32)
Calcium: 9.1 mg/dL (ref 8.9–10.3)
Chloride: 104 mmol/L (ref 98–111)
Creatinine, Ser: 1.83 mg/dL — ABNORMAL HIGH (ref 0.61–1.24)
GFR, Estimated: 35 mL/min — ABNORMAL LOW (ref >60)
Glucose, Bld: 158 mg/dL — ABNORMAL HIGH (ref 70–99)
Potassium: 5 mmol/L (ref 3.5–5.1)
Sodium: 140 mmol/L (ref 135–145)

## 2021-12-28 LAB — PROTIME-INR
INR: 1.9 — ABNORMAL HIGH (ref 0.8–1.2)
Prothrombin Time: 21.5 seconds — ABNORMAL HIGH (ref 11.4–15.2)

## 2021-12-28 LAB — SAMPLE TO BLOOD BANK

## 2021-12-28 LAB — COMPREHENSIVE METABOLIC PANEL
ALT: 17 U/L (ref 0–44)
AST: 32 U/L (ref 15–41)
Albumin: 2.5 g/dL — ABNORMAL LOW (ref 3.5–5.0)
Alkaline Phosphatase: 126 U/L (ref 38–126)
Anion gap: 12 (ref 5–15)
BUN: 45 mg/dL — ABNORMAL HIGH (ref 8–23)
CO2: 19 mmol/L — ABNORMAL LOW (ref 22–32)
Calcium: 8.5 mg/dL — ABNORMAL LOW (ref 8.9–10.3)
Chloride: 106 mmol/L (ref 98–111)
Creatinine, Ser: 1.95 mg/dL — ABNORMAL HIGH (ref 0.61–1.24)
GFR, Estimated: 32 mL/min — ABNORMAL LOW (ref >60)
Glucose, Bld: 220 mg/dL — ABNORMAL HIGH (ref 70–99)
Potassium: 5.8 mmol/L — ABNORMAL HIGH (ref 3.5–5.1)
Sodium: 137 mmol/L (ref 135–145)
Total Bilirubin: 0.6 mg/dL (ref 0.3–1.2)
Total Protein: 5.4 g/dL — ABNORMAL LOW (ref 6.5–8.1)

## 2021-12-28 LAB — ETHANOL: Alcohol, Ethyl (B): <10 mg/dL (ref <10)

## 2021-12-28 LAB — PREPARE RBC (CROSSMATCH)

## 2021-12-28 LAB — ABO/RH: ABO/RH(D): AB POS

## 2021-12-28 LAB — LACTIC ACID, PLASMA: Lactic Acid, Venous: 4.9 mmol/L (ref 0.5–1.9)

## 2021-12-28 MED ORDER — SODIUM ZIRCONIUM CYCLOSILICATE 10 G PO PACK
10.0000 g | PACK | Freq: Once | ORAL | Status: AC
  Administered 2021-12-28: 10 g via ORAL
  Filled 2021-12-28: qty 1

## 2021-12-28 MED ORDER — DEXTROSE 50 % IV SOLN
INTRAVENOUS | Status: AC
  Filled 2021-12-28: qty 50

## 2021-12-28 MED ORDER — SODIUM CHLORIDE 0.9% IV SOLUTION
Freq: Once | INTRAVENOUS | Status: AC
  Administered 2021-12-28: 500 mL via INTRAVENOUS

## 2021-12-28 MED ORDER — SODIUM CHLORIDE 0.9 % IV SOLN: INTRAVENOUS | Status: AC

## 2021-12-28 MED ORDER — SODIUM CHLORIDE 0.9% FLUSH
3.0000 mL | Freq: Two times a day (BID) | INTRAVENOUS | Status: DC
  Administered 2021-12-28 – 2022-01-10 (×16): 3 mL via INTRAVENOUS

## 2021-12-28 MED ORDER — ENZALUTAMIDE 40 MG PO TABS
80.0000 mg | ORAL_TABLET | Freq: Two times a day (BID) | ORAL | Status: DC
  Filled 2021-12-28: qty 2

## 2021-12-28 MED ORDER — ZIPRASIDONE MESYLATE 20 MG IM SOLR
INTRAMUSCULAR | Status: AC
  Administered 2021-12-28: 20 mg via INTRAMUSCULAR
  Filled 2021-12-28: qty 20

## 2021-12-28 MED ORDER — LIDOCAINE HCL 2 % IJ SOLN
INTRAMUSCULAR | Status: AC
  Administered 2021-12-28: 400 mg via INTRADERMAL
  Filled 2021-12-28: qty 20

## 2021-12-28 MED ORDER — HALOPERIDOL LACTATE 5 MG/ML IJ SOLN: 5.0000 mg | Freq: Four times a day (QID) | INTRAMUSCULAR | Status: DC | PRN

## 2021-12-28 MED ORDER — SODIUM CHLORIDE 0.9 % IV SOLN
INTRAVENOUS | Status: AC | PRN
  Administered 2021-12-28: 1000 mL via INTRAVENOUS

## 2021-12-28 MED ORDER — SODIUM CHLORIDE 0.9 % IV SOLN: 1.0000 g | Freq: Once | INTRAVENOUS | Status: DC

## 2021-12-28 MED ORDER — HALOPERIDOL LACTATE 5 MG/ML IJ SOLN
2.5000 mg | Freq: Four times a day (QID) | INTRAMUSCULAR | Status: AC | PRN
  Administered 2021-12-29 – 2021-12-30 (×2): 2.5 mg via INTRAVENOUS
  Filled 2021-12-28 (×2): qty 1

## 2021-12-28 MED ORDER — SODIUM CHLORIDE 0.9 % IV BOLUS: 20.0000 mL/kg | Freq: Once | INTRAVENOUS | Status: DC

## 2021-12-28 MED ORDER — SODIUM CHLORIDE 0.9 % IV SOLN: 500.0000 mg | Freq: Once | INTRAVENOUS | Status: DC

## 2021-12-28 MED ORDER — ROSUVASTATIN CALCIUM 20 MG PO TABS
40.0000 mg | ORAL_TABLET | Freq: Every day | ORAL | Status: DC
  Administered 2021-12-30 – 2022-01-10 (×11): 40 mg via ORAL
  Filled 2021-12-28 (×12): qty 2

## 2021-12-28 MED ORDER — ENSURE ENLIVE PO LIQD
237.0000 mL | Freq: Two times a day (BID) | ORAL | Status: DC
  Administered 2021-12-28 – 2022-01-09 (×22): 237 mL via ORAL
  Filled 2021-12-28: qty 237

## 2021-12-28 MED ORDER — METOPROLOL TARTRATE 5 MG/5ML IV SOLN: 2.5000 mg | Freq: Four times a day (QID) | INTRAVENOUS | Status: DC | PRN

## 2021-12-28 MED ORDER — DEXTROSE 50 % IV SOLN
1.0000 | Freq: Once | INTRAVENOUS | Status: AC
  Administered 2021-12-28: 50 mL via INTRAVENOUS
  Filled 2021-12-28: qty 50

## 2021-12-28 MED ORDER — FENTANYL CITRATE PF 50 MCG/ML IJ SOSY: 100.0000 ug | PREFILLED_SYRINGE | Freq: Once | INTRAMUSCULAR | Status: AC

## 2021-12-28 MED ORDER — HYDROMORPHONE HCL 1 MG/ML IJ SOLN
0.5000 mg | INTRAMUSCULAR | Status: DC | PRN
  Administered 2021-12-29: 0.5 mg via INTRAVENOUS
  Filled 2021-12-28: qty 0.5

## 2021-12-28 MED ORDER — HALOPERIDOL LACTATE 5 MG/ML IJ SOLN
INTRAMUSCULAR | Status: AC
  Administered 2021-12-28: 5 mg via INTRAVENOUS
  Filled 2021-12-28: qty 1

## 2021-12-28 MED ORDER — STERILE WATER FOR INJECTION IJ SOLN
INTRAMUSCULAR | Status: AC
  Filled 2021-12-28: qty 10

## 2021-12-28 MED ORDER — ACETAMINOPHEN 650 MG RE SUPP: 650.0000 mg | Freq: Four times a day (QID) | RECTAL | Status: DC | PRN

## 2021-12-28 MED ORDER — INSULIN ASPART 100 UNIT/ML IV SOLN
5.0000 [IU] | Freq: Once | INTRAVENOUS | Status: AC
  Administered 2021-12-28: 5 [IU] via INTRAVENOUS

## 2021-12-28 MED ORDER — COLESEVELAM HCL 625 MG PO TABS
1250.0000 mg | ORAL_TABLET | Freq: Two times a day (BID) | ORAL | Status: DC
  Administered 2021-12-30 – 2022-01-10 (×21): 1250 mg via ORAL
  Filled 2021-12-28 (×27): qty 2

## 2021-12-28 MED ORDER — ZIPRASIDONE MESYLATE 20 MG IM SOLR: 20.0000 mg | Freq: Once | INTRAMUSCULAR | Status: AC

## 2021-12-28 MED ORDER — LIDOCAINE HCL 2 % IJ SOLN: 20.0000 mL | Freq: Once | INTRAMUSCULAR | Status: AC

## 2021-12-28 MED ORDER — ACETAMINOPHEN 325 MG PO TABS: 650.0000 mg | ORAL_TABLET | Freq: Four times a day (QID) | ORAL | Status: DC | PRN

## 2021-12-28 MED ORDER — FENTANYL CITRATE PF 50 MCG/ML IJ SOSY
PREFILLED_SYRINGE | INTRAMUSCULAR | Status: AC
  Filled 2021-12-28: qty 1

## 2021-12-28 MED ORDER — HALOPERIDOL LACTATE 5 MG/ML IJ SOLN: 5.0000 mg | Freq: Once | INTRAMUSCULAR | Status: AC

## 2021-12-28 NOTE — ED Notes (Signed)
Found pt attempting to get out of bed at this time. Pt remains confused and talking about things that is not reference to situation or time. Pt placed back in bed

## 2021-12-28 NOTE — ED Notes (Signed)
Pt beginning to become combative, hitting, biting, and grabbing staff. Pt removed one of his Ivs. MD notified. Pt placed in 4 point restraints and meds to be ordered.  Per staffing there are no sitters available - Daughter contacted but daughter just got home from being with pt all day

## 2021-12-28 NOTE — ED Notes (Addendum)
6N called multiple times to initiate purple man. 6N now stating that room is not clean. Pt bed has been assigned since 1817 and marked as green entire time. Charge RN made aware

## 2021-12-28 NOTE — ED Notes (Signed)
Found pt sitting at the edge of the bed trying to get out of bed. Pt is confused at this time asking if this RN is the Scientist, physiological, stating he needed a towel to wrap around him. Pt placed back into bed and attempted to reorient pt

## 2021-12-28 NOTE — ED Notes (Signed)
Tarri Abernethy daughter [phone] please call with any changes or updates

## 2021-12-28 NOTE — Progress Notes (Signed)
Orthopedic Tech Progress Note Patient Details:  Jacob Becker 1929/04/29 [id number]  Level 2 trauma, ortho not needed as of now   Patient ID: Jacob Becker, male   DOB: February 06, 1929, 88 y.o.   MRN: [id number]  [name] 12/28/2021, 12:02 PM

## 2021-12-28 NOTE — ED Triage Notes (Signed)
Pt BIB [facility] from home as Level 1 Fall on thinners with SBP in the 80's. Pt reports he fell on a wooden floor from a standing position from dizziness, hit his forehead (central abrasion noted) on the floor, there is also an abrasion to huis Lt temple & both knees. A/Ox4, denies any pain upon arrival, 22g PIV in Lt hand. Has fell recently & has a stage 2 on Lt elbow from that incident. Was given 100 cc NS in PIV while en route. Pale in appearance & cool to touch, but still A/Ox4 upon arrival.

## 2021-12-28 NOTE — ED Notes (Signed)
Contacted staffing to request a safety sitter at this time. Was advised none available at this time.

## 2021-12-28 NOTE — ED Notes (Signed)
Floor ready to receive pt  

## 2021-12-28 NOTE — ED Notes (Signed)
X-ray at bedside

## 2021-12-28 NOTE — H&P (Addendum)
History and Physical    Jacob Becker WUX:[id number] DOB: [birth year] DOA: 12/28/2021  PCP: Emilio Aspen, MD (Confirm with patient/family/NH records and if not entered, this has to be entered at [facility] point of entry) Patient coming from: Home  I have personally briefly reviewed patient's old medical records in [facility]  Chief Complaint: I do not remember what happened  HPI: Jacob Becker is a 88 y.o. male with medical history significant of advanced dementia, chronic A-fib on Xarelto, HTN, CAD, prostate cancer in remission on chronic hormone manipulation, hepatic and pancreatic cysts, presented with near syncope and fall.  Patient has advanced dementia and only thing he could remember was he started to feel lightheadedness since yesterday evening, and this morning, by accident, he locked himself inside the bedroom, and when family knocked the door from outside, patient did not realize and went to open the door, but lost his balance and fell on the right side of the chest.  No head injury, no LOC.  Daughter noticed the patient looked pale and clammy, and had slurred speech and confusion.  EMS arrived and found patient blood pressure in the lower 80s and bradycardia in the 50s.  According to patient's cardiologist note, patient has hypertension and A-fib but on the recent visit in April 2023, it was noticed the patient develop borderline bradycardia and borderline hypotension and cardiology recommended to cut down patient's metoprolol from 100 mg daily to 50 mg daily and also cut down amlodipine to 5 mg daily if necessary.  Daughter reported that the patient lives at home with wife and with her caregiver on site help two times a day.  For some reason patient did his medication recently including blood pressure, A-fib and Xarelto for at least 1 month.  And daughter found out the fact only last week and patient was restarted on all his medication 3 days ago.  ED Course: Initial  bradycardia and hypotension responded to IV fluid en route.  Then developed rebound hypertension SBP in the 140s, heart rate remains in the lower 60s.  Trauma scan CT head and neck negative for fracture or dislocation or acute findings.  CT chest showed acute hemothorax and 10th, 11th rib fracture on the right side.  Hemoglobin 8.2 and no recent CBC level in the last 3 years.  Blood work showed AKI creatinine 1.9 compared to less than 1.1 last year, K5.7.  Patient was given small IV bolus and trauma surgeon placed in chest tube.  Review of Systems: Unable to perform, patient has baseline dementia.  History reviewed. No pertinent past medical history.  History reviewed. No pertinent surgical history.   has no history on file for tobacco use, alcohol use, and drug use.  Not on File  History reviewed. No pertinent family history.   Prior to Admission medications   Not on File    Physical Exam: Vitals:   12/28/21 1330 12/28/21 1415 12/28/21 1430 12/28/21 1437  BP: (!) 120/55 139/70 (!) 131/109   Pulse: 69 65 72   Resp: 19 19 16    Temp:    (!) 97.5 F (36.4 C)  TempSrc:    Axillary  SpO2: 98% 98% 99%   Weight:      Height:        Constitutional: NAD, calm, comfortable Vitals:   12/28/21 1330 12/28/21 1415 12/28/21 1430 12/28/21 1437  BP: (!) 120/55 139/70 (!) 131/109   Pulse: 69 65 72   Resp: 19 19 16  Temp:    (!) 97.5 F (36.4 C)  TempSrc:    Axillary  SpO2: 98% 98% 99%   Weight:      Height:       Eyes: PERRL, lids and conjunctivae normal ENMT: Mucous membranes are dry. Posterior pharynx clear of any exudate or lesions.Normal dentition.  Neck: normal, supple, no masses, no thyromegaly Respiratory: clear to auscultation bilaterally, no wheezing, no crackles. Normal respiratory effort. No accessory muscle use. Right sided chest tube in place, no leaking. Cardiovascular: Irregular heart rate, soft systolic murmur on heart base. No extremity edema. 2+ pedal pulses. No  carotid bruits.  Abdomen: no tenderness, no masses palpated. No hepatosplenomegaly. Bowel sounds positive.  Musculoskeletal: no clubbing / cyanosis. No joint deformity upper and lower extremities. Good ROM, no contractures. Normal muscle tone.  Skin: no rashes, lesions, ulcers. No induration Neurologic: No facial droops Psychiatric: Normal judgment and insight. Alert and oriented x 3. Normal mood.    Labs on Admission: I have personally reviewed following labs and imaging studies  CBC: Recent Labs  Lab 12/28/21 1043 12/28/21 1107  WBC 12.2*  --   HGB 8.2* 7.8*  HCT 25.5* 23.0*  MCV 100.8*  --   PLT 159  --    Basic Metabolic Panel: Recent Labs  Lab 12/28/21 1043 12/28/21 1107  NA 137 136  K 5.8* 5.7*  CL 106 114*  CO2 19*  --   GLUCOSE 220* 210*  BUN 45* 42*  CREATININE 1.95* 1.90*  CALCIUM 8.5*  --    GFR: Estimated Creatinine Clearance: 24.9 mL/min (A) (by C-G formula based on SCr of 1.9 mg/dL (H)). Liver Function Tests: Recent Labs  Lab 12/28/21 1043  AST 32  ALT 17  ALKPHOS 126  BILITOT 0.6  PROT 5.4*  ALBUMIN 2.5*   No results for input(s): "LIPASE", "AMYLASE" in the last 168 hours. No results for input(s): "AMMONIA" in the last 168 hours. Coagulation Profile: Recent Labs  Lab 12/28/21 1043  INR 1.9*   Cardiac Enzymes: No results for input(s): "CKTOTAL", "CKMB", "CKMBINDEX", "TROPONINI" in the last 168 hours. BNP (last 3 results) No results for input(s): "PROBNP" in the last 8760 hours. HbA1C: No results for input(s): "HGBA1C" in the last 72 hours. CBG: No results for input(s): "GLUCAP" in the last 168 hours. Lipid Profile: No results for input(s): "CHOL", "HDL", "LDLCALC", "TRIG", "CHOLHDL", "LDLDIRECT" in the last 72 hours. Thyroid Function Tests: No results for input(s): "TSH", "T4TOTAL", "FREET4", "T3FREE", "THYROIDAB" in the last 72 hours. Anemia Panel: No results for input(s): "VITAMINB12", "FOLATE", "FERRITIN", "TIBC", "IRON",  "RETICCTPCT" in the last 72 hours. Urine analysis: No results found for: "COLORURINE", "APPEARANCEUR", "LABSPEC", "PHURINE", "GLUCOSEU", "HGBUR", "BILIRUBINUR", "KETONESUR", "PROTEINUR", "UROBILINOGEN", "NITRITE", "LEUKOCYTESUR"  Radiological Exams on Admission: CT Chest Wo Contrast  Result Date: 12/28/2021 CLINICAL DATA:  Respiratory illness EXAM: CT CHEST WITHOUT CONTRAST TECHNIQUE: Multidetector CT imaging of the chest was performed following the standard protocol without IV contrast. RADIATION DOSE REDUCTION: This exam was performed according to the departmental dose-optimization program which includes automated exposure control, adjustment of the mA and/or kV according to patient size and/or use of iterative reconstruction technique. COMPARISON:  CT chest, and pelvis dated January 19, 2021 FINDINGS: Cardiovascular: Mild cardiomegaly. No pericardial effusion. Normal caliber thoracic aorta with moderate calcified plaque. Mitral annular calcifications. Severe left main and three-vessel coronary artery calcifications. Mediastinum/Nodes: Esophagus and thyroid are unremarkable. No pathologically enlarged lymph nodes seen in the chest. Lungs/Pleura: Central airways are patent. Moderate right pleural effusion with  associated hyperdensity. Linear ground-glass opacities of the right hemithorax, likely due to atelectasis. Upper Abdomen: Unchanged appearance of large complex cyst of the right hepatic dome, likely due to prior hemorrhage. Additional low-attenuation simple hepatic cysts, unchanged when compared to prior. Bilateral low-attenuation renal lesions which are likely simple cysts, no specific follow-up imaging is recommended. Unchanged left renal artery aneurysm measuring 1.5 cm. No acute abnormality. Musculoskeletal: Multiple sclerotic lesions involving the thoracic spine, ribs and sternum, unchanged when compared with prior exam. Mildly displaced posterolateral right 10th and 11th rib fractures.  IMPRESSION: 1. Mildly displaced posterolateral right 10th and 11th rib fractures. 2. Moderate right hemothorax. 3. Stable sclerotic skeletal metastases, compatible with history of prostate cancer. 4. Aortic Atherosclerosis (ICD10-I70.0). Critical Value/emergent results were called by telephone at the time of interpretation on 12/28/2021 at 12:58 pm to provider Alvino Blood , who verbally acknowledged these results. Electronically Signed   By: Allegra Lai M.D.   On: 12/28/2021 13:03   CT HEAD WO CONTRAST  Result Date: 12/28/2021 CLINICAL DATA:  Trauma. EXAM: CT HEAD WITHOUT CONTRAST CT CERVICAL SPINE WITHOUT CONTRAST TECHNIQUE: Multidetector CT imaging of the head and cervical spine was performed following the standard protocol without intravenous contrast. Multiplanar CT image reconstructions of the cervical spine were also generated. RADIATION DOSE REDUCTION: This exam was performed according to the departmental dose-optimization program which includes automated exposure control, adjustment of the mA and/or kV according to patient size and/or use of iterative reconstruction technique. COMPARISON:  12/21/21 CT head FINDINGS: CT HEAD FINDINGS Brain: No evidence of acute infarction, hemorrhage, hydrocephalus, extra-axial collection or mass lesion/mass effect. There is sequela of moderate chronic microvascular ischemic change with advanced generalized volume loss and parietal lobe predominance. Vascular: No hyperdense vessel or unexpected calcification. Skull: Normal. Negative for fracture or focal lesion. Sinuses/Orbits: Bilateral lens replacement. Other: None. CT CERVICAL SPINE FINDINGS Alignment: There is grade 1 anterolisthesis of C4 on C5 and C7 on T1. There is mild retrolisthesis of C5 on C6. Skull base and vertebrae: No acute fracture. No primary bone lesion or focal pathologic process. Soft tissues and spinal canal: No prevertebral fluid or swelling. No visible canal hematoma. Disc levels: There is  severe multilevel facet degenerative change with fusion of the facet joints at C2-C3 on the left and C3-C4 and C4-C5 on the right. There is also fusion of the C2-C4 vertebral bodies. Upper chest: Right-sided pleural effusion. Other: Negative IMPRESSION: 1. No acute intracranial abnormality. 2. No acute fracture or traumatic malalignment of the cervical spine. 3. Advanced generalized volume loss and sequela of moderate chronic microvascular ischemic change. 4. Right-sided pleural effusion, which subjectively appears hyperdense. If there is concern for a rib fracture/thoracic injury, this could represent pleural blood products. Recommend further evaluation with a dedicated CT chest. Electronically Signed   By: Lorenza Cambridge M.D.   On: 12/28/2021 11:54   CT CERVICAL SPINE WO CONTRAST  Result Date: 12/28/2021 CLINICAL DATA:  Trauma. EXAM: CT HEAD WITHOUT CONTRAST CT CERVICAL SPINE WITHOUT CONTRAST TECHNIQUE: Multidetector CT imaging of the head and cervical spine was performed following the standard protocol without intravenous contrast. Multiplanar CT image reconstructions of the cervical spine were also generated. RADIATION DOSE REDUCTION: This exam was performed according to the departmental dose-optimization program which includes automated exposure control, adjustment of the mA and/or kV according to patient size and/or use of iterative reconstruction technique. COMPARISON:  12/21/21 CT head FINDINGS: CT HEAD FINDINGS Brain: No evidence of acute infarction, hemorrhage, hydrocephalus, extra-axial collection or mass lesion/mass  effect. There is sequela of moderate chronic microvascular ischemic change with advanced generalized volume loss and parietal lobe predominance. Vascular: No hyperdense vessel or unexpected calcification. Skull: Normal. Negative for fracture or focal lesion. Sinuses/Orbits: Bilateral lens replacement. Other: None. CT CERVICAL SPINE FINDINGS Alignment: There is grade 1 anterolisthesis of C4  on C5 and C7 on T1. There is mild retrolisthesis of C5 on C6. Skull base and vertebrae: No acute fracture. No primary bone lesion or focal pathologic process. Soft tissues and spinal canal: No prevertebral fluid or swelling. No visible canal hematoma. Disc levels: There is severe multilevel facet degenerative change with fusion of the facet joints at C2-C3 on the left and C3-C4 and C4-C5 on the right. There is also fusion of the C2-C4 vertebral bodies. Upper chest: Right-sided pleural effusion. Other: Negative IMPRESSION: 1. No acute intracranial abnormality. 2. No acute fracture or traumatic malalignment of the cervical spine. 3. Advanced generalized volume loss and sequela of moderate chronic microvascular ischemic change. 4. Right-sided pleural effusion, which subjectively appears hyperdense. If there is concern for a rib fracture/thoracic injury, this could represent pleural blood products. Recommend further evaluation with a dedicated CT chest. Electronically Signed   By: Lorenza Cambridge M.D.   On: 12/28/2021 11:54   DG Chest Port 1 View  Result Date: 12/28/2021 CLINICAL DATA:  Fall at home.  On blood thinners.  Hypotension. EXAM: PORTABLE CHEST 1 VIEW COMPARISON:  Chest CT 01/19/2021.  No recent radiographs. FINDINGS: 1034 hours. Two views submitted. The heart size and mediastinal contours are stable with aortic atherosclerosis and tortuosity. There is new asymmetric opacity in the right hemithorax suspicious for a layering pleural effusion. Mild associated right basilar atelectasis or infiltrate. No evidence of pneumothorax. The left lung appears clear. No acute fractures are identified. Known osseous metastatic disease from prostate cancer is not well seen on these images. Telemetry leads overlie the chest. IMPRESSION: New asymmetric opacity in the right hemithorax suspicious for a layering pleural effusion with associated right basilar atelectasis or infiltrate. Follow-up PA and lateral views recommended  when possible. No evidence of pneumothorax. Electronically Signed   By: Carey Bullocks M.D.   On: 12/28/2021 10:49    EKG: Independently reviewed.  A-fib with episodic bradycardia, no tented T waves noticed.  Assessment/Plan Principal Problem:   Hemothorax on right Active Problems:   Near syncope   AKI (acute kidney injury) (HCC)   Hyperkalemia   A-fib (HCC)  (please populate well all problems here in Problem List. (For example, if patient is on BP meds at home and you resume or decide to hold them, it is a problem that needs to be her. Same for CAD, COPD, HLD and so on)  Right sided hemothorax, traumatic -Chest tube placed in by trauma surgeon in the ED, repeat chest x-ray showed resolution of right-sided pneumothorax and chest tube in the upper mid chest -Hold off PT evaluation until chest tube can be clamped or discontinued.  Acute blood loss anemia -Secondary to rib fracture and right-sided hemothorax -Repeat hemoglobin trending down from 8.2> 7.8 -Given patient has acute anemia symptoms both hypotension and bradycardia, will give patient 1 unit of PRBC.  Discussed with patient daughter at bedside. -Repeat H/H tonight -Last dosage of Xarelto yesterday evening, on indication for reversal. Continue to hold off Xarelto and no chemical DVT prophylaxis today  AKI -Secondary to hypotension anddehydration -CT abdomen pelvis pending to rule out renal obstructions -Start patient on maintenance IV fluid, repeat kidney function tomorrow. -Hold off  BP meds including metoprolol, amlodipine, HCTZ and ACEI for today.  Hyperkalemia -Secondary AKI -No significant T wave changes on EKG, 1 dose of Lokelma given.  Also ordered stat insulin/dextrose cocktail. -Repeat K level in 4 hours  Lactic acidosis -Likely secondary to hypoperfusion from hypotension and bradycardia and acute blood loss anemia.  Patient has no cough no urinary symptoms no diarrhea to indicate active infection, monitor off  antibiotics. -Hydration, transfusion and trend lactic level  Acute metabolic encephalopathy -Resolved, likely related to hypotension -According to daughter patient had brief moment of confusion and slurred speech.  The symptoms happened when patient was having hypotension and bradycardia, and soon resolved after small IV bolus given by EMS. -And patient on Xarelto chronically for A-fib. Will not order MRI as regardless of the result, patient will need to be on long-term Xarelto.  Explained to patient daughter at bedside who expressed understanding and agreed.  Near syncope -Likely from commendation of hypotension and bradycardia. -On most recent cardiology evaluation in April 2023, cardiology cut down patient's metoprolol to 50 mg daily and plan to further cut down patient's amlodipine as patient continues to lose weight.  Cardiology given and consider readjust Xarelto in the future. However, I called patient's pharmacy [facility] and patient still taking same dosage of metoprolol XL 100 mg daily and amlodipine 10 mg daily. Both will be on hold and likely will need to cut down dosages on discharge. Daughter aware. -Will check Echo.  Elevated lactate  Chronic A-fib -Hold metoprolol and Xarelto -As needed metoprolol  Chronic urinary retention -Has been doing self catheterizing at home, will continue.  Prostate CA -In Remission, continue home hormone therapy medication  Liver cyst, pancreatic cyst -Stable on CT scanning.  Moderate protein calorie malnutrition with unintentional weight loss -about 15 lbs weight loss in last 6 months for poor appetite and food intake, for which patient has been on Ensure twice daily.  DVT prophylaxis: SCD Code Status: Full code Family Communication: Daughter at bedside Disposition Plan: Patient is sick with fall and right-sided traumatic hemothorax requiring chest tube and inpatient management, expect more than 2 midnight hospital  stay. Consults called: Trauma surgery Admission status: Tele admission   Emeline General MD [facility] Pager [phone]  12/28/2021, 2:44 PM

## 2021-12-28 NOTE — ED Notes (Signed)
Requested secretary to page admit provider to be paged at this time

## 2021-12-28 NOTE — ED Provider Notes (Signed)
[facility] EMERGENCY DEPARTMENT Provider Note  CSN: [id number] Arrival date & time: 12/28/21 1017  Chief Complaint(s) Level 1, Hypotension, and Fall on thinners  HPI Jacob Becker is a 88 y.o. male with history of A-fib on Eliquis, coronary artery disease, hyperlipidemia, prostate cancer presenting after fall.  Patient reports today he had generalized weakness.  When getting up, he felt very weak and then collapsed, hitting his left face on the ground.  Denies loss of consciousness.  Denies any nausea, vomiting.  He denies any chest or abdominal pain.  Denies any pain is in extremities.  He denies any specific symptoms of infection such as cough, dysuria, abdominal pain, nausea, vomiting, diarrhea, headache, neck stiffness, rashes.  Denies any fevers or chills.   Past Medical History History reviewed. No pertinent past medical history. There are no problems to display for this patient.  Home Medication(s) Prior to Admission medications   Not on File                                                                                                                                    Past Surgical History History reviewed. No pertinent surgical history. Family History History reviewed. No pertinent family history.  Social History   Allergies Patient has no allergy information on record.  Review of Systems Review of Systems  Physical Exam Vital Signs  I have reviewed the triage vital signs BP (!) 120/55   Pulse 69   Resp 19   Ht 5\' 10"  (1.778 m)   Wt 68 kg   SpO2 98%   BMI 21.52 kg/m  Physical Exam Vitals and nursing note reviewed.  Constitutional:      General: He is not in acute distress.    Appearance: Normal appearance.  HENT:     Head:     Comments: Small abrasion to left lateral face without laceration    Mouth/Throat:     Mouth: Mucous membranes are moist.  Eyes:     Conjunctiva/sclera: Conjunctivae normal.  Cardiovascular:     Rate and  Rhythm: Normal rate and regular rhythm.  Pulmonary:     Effort: Pulmonary effort is normal. No respiratory distress.     Breath sounds: Normal breath sounds.  Abdominal:     General: Abdomen is flat.     Palpations: Abdomen is soft.     Tenderness: There is no abdominal tenderness.  Musculoskeletal:     Right lower leg: No edema.     Left lower leg: No edema.     Comments: No midline C, T, L-spine tenderness.  No chest wall tenderness or crepitus.  Full painless range of motion at the bilateral upper extremities including the shoulders, elbows, wrists, hand and fingers, and in the bilateral lower extremities including the hips, knees, ankle, toes.  No focal bony tenderness, injury or deformity.  Skin:    General: Skin is warm and dry.  Capillary Refill: Capillary refill takes less than 2 seconds.     Coloration: Skin is pale.     Comments: Abrasions to bilateral knees  Neurological:     Mental Status: He is alert and oriented to person, place, and time. Mental status is at baseline.  Psychiatric:        Mood and Affect: Mood normal.        Behavior: Behavior normal.     ED Results and Treatments Labs (all labs ordered are listed, but only abnormal results are displayed) Labs Reviewed  COMPREHENSIVE METABOLIC PANEL - Abnormal; Notable for the following components:      Result Value   Potassium 5.8 (*)    CO2 19 (*)    Glucose, Bld 220 (*)    BUN 45 (*)    Creatinine, Ser 1.95 (*)    Calcium 8.5 (*)    Total Protein 5.4 (*)    Albumin 2.5 (*)    GFR, Estimated 32 (*)    All other components within normal limits  CBC - Abnormal; Notable for the following components:   WBC 12.2 (*)    RBC 2.53 (*)    Hemoglobin 8.2 (*)    HCT 25.5 (*)    MCV 100.8 (*)    All other components within normal limits  LACTIC ACID, PLASMA - Abnormal; Notable for the following components:   Lactic Acid, Venous 4.9 (*)    All other components within normal limits  PROTIME-INR - Abnormal;  Notable for the following components:   Prothrombin Time 21.5 (*)    INR 1.9 (*)    All other components within normal limits  I-STAT CHEM 8, ED - Abnormal; Notable for the following components:   Potassium 5.7 (*)    Chloride 114 (*)    BUN 42 (*)    Creatinine, Ser 1.90 (*)    Glucose, Bld 210 (*)    Calcium, Ion 1.12 (*)    TCO2 19 (*)    Hemoglobin 7.8 (*)    HCT 23.0 (*)    All other components within normal limits  CULTURE, BLOOD (ROUTINE X 2)  CULTURE, BLOOD (ROUTINE X 2)  URINE CULTURE  ETHANOL  URINALYSIS, ROUTINE W REFLEX MICROSCOPIC  HEMOGLOBIN AND HEMATOCRIT, BLOOD  HEMOGLOBIN AND HEMATOCRIT, BLOOD  SAMPLE TO BLOOD BANK                                                                                                                          Radiology CT Chest Wo Contrast  Result Date: 12/28/2021 CLINICAL DATA:  Respiratory illness EXAM: CT CHEST WITHOUT CONTRAST TECHNIQUE: Multidetector CT imaging of the chest was performed following the standard protocol without IV contrast. RADIATION DOSE REDUCTION: This exam was performed according to the departmental dose-optimization program which includes automated exposure control, adjustment of the mA and/or kV according to patient size and/or use of iterative reconstruction technique. COMPARISON:  CT chest, and pelvis dated January 19, 2021 FINDINGS:  Cardiovascular: Mild cardiomegaly. No pericardial effusion. Normal caliber thoracic aorta with moderate calcified plaque. Mitral annular calcifications. Severe left main and three-vessel coronary artery calcifications. Mediastinum/Nodes: Esophagus and thyroid are unremarkable. No pathologically enlarged lymph nodes seen in the chest. Lungs/Pleura: Central airways are patent. Moderate right pleural effusion with associated hyperdensity. Linear ground-glass opacities of the right hemithorax, likely due to atelectasis. Upper Abdomen: Unchanged appearance of large complex cyst of the right  hepatic dome, likely due to prior hemorrhage. Additional low-attenuation simple hepatic cysts, unchanged when compared to prior. Bilateral low-attenuation renal lesions which are likely simple cysts, no specific follow-up imaging is recommended. Unchanged left renal artery aneurysm measuring 1.5 cm. No acute abnormality. Musculoskeletal: Multiple sclerotic lesions involving the thoracic spine, ribs and sternum, unchanged when compared with prior exam. Mildly displaced posterolateral right 10th and 11th rib fractures. IMPRESSION: 1. Mildly displaced posterolateral right 10th and 11th rib fractures. 2. Moderate right hemothorax. 3. Stable sclerotic skeletal metastases, compatible with history of prostate cancer. 4. Aortic Atherosclerosis (ICD10-I70.0). Critical Value/emergent results were called by telephone at the time of interpretation on 12/28/2021 at 12:58 pm to provider Alvino Blood , who verbally acknowledged these results. Electronically Signed   By: Allegra Lai M.D.   On: 12/28/2021 13:03   CT HEAD WO CONTRAST  Result Date: 12/28/2021 CLINICAL DATA:  Trauma. EXAM: CT HEAD WITHOUT CONTRAST CT CERVICAL SPINE WITHOUT CONTRAST TECHNIQUE: Multidetector CT imaging of the head and cervical spine was performed following the standard protocol without intravenous contrast. Multiplanar CT image reconstructions of the cervical spine were also generated. RADIATION DOSE REDUCTION: This exam was performed according to the departmental dose-optimization program which includes automated exposure control, adjustment of the mA and/or kV according to patient size and/or use of iterative reconstruction technique. COMPARISON:  12/21/21 CT head FINDINGS: CT HEAD FINDINGS Brain: No evidence of acute infarction, hemorrhage, hydrocephalus, extra-axial collection or mass lesion/mass effect. There is sequela of moderate chronic microvascular ischemic change with advanced generalized volume loss and parietal lobe predominance.  Vascular: No hyperdense vessel or unexpected calcification. Skull: Normal. Negative for fracture or focal lesion. Sinuses/Orbits: Bilateral lens replacement. Other: None. CT CERVICAL SPINE FINDINGS Alignment: There is grade 1 anterolisthesis of C4 on C5 and C7 on T1. There is mild retrolisthesis of C5 on C6. Skull base and vertebrae: No acute fracture. No primary bone lesion or focal pathologic process. Soft tissues and spinal canal: No prevertebral fluid or swelling. No visible canal hematoma. Disc levels: There is severe multilevel facet degenerative change with fusion of the facet joints at C2-C3 on the left and C3-C4 and C4-C5 on the right. There is also fusion of the C2-C4 vertebral bodies. Upper chest: Right-sided pleural effusion. Other: Negative IMPRESSION: 1. No acute intracranial abnormality. 2. No acute fracture or traumatic malalignment of the cervical spine. 3. Advanced generalized volume loss and sequela of moderate chronic microvascular ischemic change. 4. Right-sided pleural effusion, which subjectively appears hyperdense. If there is concern for a rib fracture/thoracic injury, this could represent pleural blood products. Recommend further evaluation with a dedicated CT chest. Electronically Signed   By: Lorenza Cambridge M.D.   On: 12/28/2021 11:54   CT CERVICAL SPINE WO CONTRAST  Result Date: 12/28/2021 CLINICAL DATA:  Trauma. EXAM: CT HEAD WITHOUT CONTRAST CT CERVICAL SPINE WITHOUT CONTRAST TECHNIQUE: Multidetector CT imaging of the head and cervical spine was performed following the standard protocol without intravenous contrast. Multiplanar CT image reconstructions of the cervical spine were also generated. RADIATION DOSE REDUCTION: This exam was  performed according to the departmental dose-optimization program which includes automated exposure control, adjustment of the mA and/or kV according to patient size and/or use of iterative reconstruction technique. COMPARISON:  12/21/21 CT head  FINDINGS: CT HEAD FINDINGS Brain: No evidence of acute infarction, hemorrhage, hydrocephalus, extra-axial collection or mass lesion/mass effect. There is sequela of moderate chronic microvascular ischemic change with advanced generalized volume loss and parietal lobe predominance. Vascular: No hyperdense vessel or unexpected calcification. Skull: Normal. Negative for fracture or focal lesion. Sinuses/Orbits: Bilateral lens replacement. Other: None. CT CERVICAL SPINE FINDINGS Alignment: There is grade 1 anterolisthesis of C4 on C5 and C7 on T1. There is mild retrolisthesis of C5 on C6. Skull base and vertebrae: No acute fracture. No primary bone lesion or focal pathologic process. Soft tissues and spinal canal: No prevertebral fluid or swelling. No visible canal hematoma. Disc levels: There is severe multilevel facet degenerative change with fusion of the facet joints at C2-C3 on the left and C3-C4 and C4-C5 on the right. There is also fusion of the C2-C4 vertebral bodies. Upper chest: Right-sided pleural effusion. Other: Negative IMPRESSION: 1. No acute intracranial abnormality. 2. No acute fracture or traumatic malalignment of the cervical spine. 3. Advanced generalized volume loss and sequela of moderate chronic microvascular ischemic change. 4. Right-sided pleural effusion, which subjectively appears hyperdense. If there is concern for a rib fracture/thoracic injury, this could represent pleural blood products. Recommend further evaluation with a dedicated CT chest. Electronically Signed   By: Marin Roberts M.D.   On: 12/28/2021 11:54   DG Chest Port 1 View  Result Date: 12/28/2021 CLINICAL DATA:  Fall at home.  On blood thinners.  Hypotension. EXAM: PORTABLE CHEST 1 VIEW COMPARISON:  Chest CT 01/19/2021.  No recent radiographs. FINDINGS: 1034 hours. Two views submitted. The heart size and mediastinal contours are stable with aortic atherosclerosis and tortuosity. There is new asymmetric opacity in the right  hemithorax suspicious for a layering pleural effusion. Mild associated right basilar atelectasis or infiltrate. No evidence of pneumothorax. The left lung appears clear. No acute fractures are identified. Known osseous metastatic disease from prostate cancer is not well seen on these images. Telemetry leads overlie the chest. IMPRESSION: New asymmetric opacity in the right hemithorax suspicious for a layering pleural effusion with associated right basilar atelectasis or infiltrate. Follow-up PA and lateral views recommended when possible. No evidence of pneumothorax. Electronically Signed   By: Richardean Sale M.D.   On: 12/28/2021 10:49    Pertinent labs & imaging results that were available during my care of the patient were reviewed by me and considered in my medical decision making (see MDM for details).  Medications Ordered in ED Medications  0.9 %  sodium chloride infusion (1,000 mLs Intravenous New Bag/Given 12/28/21 1020)  lidocaine (XYLOCAINE) 2 % (with pres) injection 400 mg (has no administration in time range)  lidocaine (XYLOCAINE) 2 % (with pres) injection (has no administration in time range)  fentaNYL (SUBLIMAZE) injection 100 mcg (has no administration in time range)  fentaNYL (SUBLIMAZE) 50 MCG/ML injection (has no administration in time range)  sodium zirconium cyclosilicate (LOKELMA) packet 10 g (has no administration in time range)  Procedures .Critical Care  Performed by: Cristie Hem, MD Authorized by: Cristie Hem, MD   Critical care provider statement:    Critical care time (minutes):  30   Critical care time was exclusive of:  Separately billable procedures and treating other patients   Critical care was necessary to treat or prevent imminent or life-threatening deterioration of the following conditions:  Trauma, circulatory  failure and shock   Critical care was time spent personally by me on the following activities:  Development of treatment plan with patient or surrogate, discussions with consultants, evaluation of patient's response to treatment, examination of patient, ordering and review of laboratory studies, ordering and review of radiographic studies, ordering and performing treatments and interventions, pulse oximetry, re-evaluation of patient's condition and review of old charts   Care discussed with: admitting provider     (including critical care time)  Medical Decision Making / ED Course   MDM:  88 year old male presenting to the emergency department after fall.  Patient overall well-appearing, initial vital signs with slightly low blood pressure.  Was lower with EMS.  Patient was a level 1 trauma due to fall on blood thinners and low blood pressure, but traumatic exam overall reassuring.  Will obtain CT head and neck to evaluate for intracranial bleeding given fall with head strike but low concern for acute process.  Remainder of his traumatic exam without sign of thoracic, abdominal trauma.  He has some abrasions on his knees bilaterally but is able to range them without difficulty and has no underlying tenderness.  Given low blood pressure, will obtain medical workup including blood cultures, urinalysis.  Chest x-ray reviewed by me, shows some opacification of the right chest which may be a pneumonia.  Will obtain chest CT to further evaluate.  Will fluid resuscitate.  Patient may end up needing admission for medical cause.  Clinical Course as of 12/28/21 1359  Fri Dec 28, 2021  1204 Lactic Acid, Venous(!!): 4.9 [WS]  1322 Discussed hemothorax with surgery. They will likely admit patient [WS]  [id number] Given metabolic abnormalities, surgery prefers that medicine admit the patient.  Initially ordered antibiotics to cover for pneumonia but given no pneumonia on CT scan but rather hemothorax, will hold  off for now.  Urinalysis still pending.  Discussed with the hospitalist who will admit the patient. [WS]    Clinical Course User Index [WS] Cristie Hem, MD     Additional history obtained: -Additional history obtained from ems -External records from outside source obtained and reviewed including: Chart review including previous notes, labs, imaging, consultation notes including prior records under alternative MRN, including cancer history and previous imaging   Lab Tests: -I ordered, reviewed, and interpreted labs.   The pertinent results include:   Labs Reviewed  COMPREHENSIVE METABOLIC PANEL - Abnormal; Notable for the following components:      Result Value   Potassium 5.8 (*)    CO2 19 (*)    Glucose, Bld 220 (*)    BUN 45 (*)    Creatinine, Ser 1.95 (*)    Calcium 8.5 (*)    Total Protein 5.4 (*)    Albumin 2.5 (*)    GFR, Estimated 32 (*)    All other components within normal limits  CBC - Abnormal; Notable for the following components:   WBC 12.2 (*)    RBC 2.53 (*)    Hemoglobin 8.2 (*)    HCT 25.5 (*)    MCV 100.8 (*)  All other components within normal limits  LACTIC ACID, PLASMA - Abnormal; Notable for the following components:   Lactic Acid, Venous 4.9 (*)    All other components within normal limits  PROTIME-INR - Abnormal; Notable for the following components:   Prothrombin Time 21.5 (*)    INR 1.9 (*)    All other components within normal limits  I-STAT CHEM 8, ED - Abnormal; Notable for the following components:   Potassium 5.7 (*)    Chloride 114 (*)    BUN 42 (*)    Creatinine, Ser 1.90 (*)    Glucose, Bld 210 (*)    Calcium, Ion 1.12 (*)    TCO2 19 (*)    Hemoglobin 7.8 (*)    HCT 23.0 (*)    All other components within normal limits  CULTURE, BLOOD (ROUTINE X 2)  CULTURE, BLOOD (ROUTINE X 2)  URINE CULTURE  ETHANOL  URINALYSIS, ROUTINE W REFLEX MICROSCOPIC  HEMOGLOBIN AND HEMATOCRIT, BLOOD  HEMOGLOBIN AND HEMATOCRIT, BLOOD   SAMPLE TO BLOOD BANK    Notable for anemia, elevated lactic acid, acute kidney injury with mild hyperkalemia   Imaging Studies ordered: I ordered imaging studies including CT head, CT chest On my interpretation imaging demonstrates hemothorax I independently visualized and interpreted imaging. I agree with the radiologist interpretation   Medicines ordered and prescription drug management: Meds ordered this encounter  Medications   DISCONTD: sodium chloride 0.9 % bolus 1,360 mL   0.9 %  sodium chloride infusion   DISCONTD: cefTRIAXone (ROCEPHIN) 1 g in sodium chloride 0.9 % 100 mL IVPB    Order Specific Question:   Antibiotic Indication:    Answer:   CAP   DISCONTD: azithromycin (ZITHROMAX) 500 mg in sodium chloride 0.9 % 250 mL IVPB   lidocaine (XYLOCAINE) 2 % (with pres) injection 400 mg   lidocaine (XYLOCAINE) 2 % (with pres) injection    Troxler, Estill Bamberg L: cabinet override   fentaNYL (SUBLIMAZE) injection 100 mcg   fentaNYL (SUBLIMAZE) 50 MCG/ML injection    Troxler, Amanda L: cabinet override   sodium zirconium cyclosilicate (LOKELMA) packet 10 g    -I have reviewed the patients home medicines and have made adjustments as needed   Consultations Obtained: I requested consultation with the general surgeon,  and discussed lab and imaging findings as well as pertinent plan - they recommend: chest tube, admisson to medicine   Cardiac Monitoring: The patient was maintained on a cardiac monitor.  I personally viewed and interpreted the cardiac monitored which showed an underlying rhythm of: NSR  Reevaluation: After the interventions noted above, I reevaluated the patient and found that they have improved  Co morbidities that complicate the patient evaluation History reviewed. No pertinent past medical history.    Dispostion: Disposition decision including need for hospitalization was considered, and patient admitted to the hospital.    Final Clinical Impression(s) /  ED Diagnoses Final diagnoses:  Near syncope  Fall, initial encounter  Lactic acidosis  Hemothorax     This chart was dictated using voice recognition software.  Despite best efforts to proofread,  errors can occur which can change the documentation meaning.    Cristie Hem, MD 12/28/21 1400

## 2021-12-28 NOTE — ED Notes (Signed)
RBC completed at 2105

## 2021-12-28 NOTE — ED Notes (Signed)
Patient transported to CT with TRN.  

## 2021-12-28 NOTE — ED Notes (Signed)
Contacted 6n reference purple man assignment advised they would assign a purple man.

## 2021-12-28 NOTE — Progress Notes (Signed)
Responded to page to support patient and staff.  Pt  felt weak and fell at home. Chaplain Provided emotional and spiritual support. Available as needed.  Venida Jarvis, [place], [facility], Pager [phone]

## 2021-12-28 NOTE — ED Notes (Signed)
Contacted 6n reference a purple man at this time and was advised that they were just getting ready to call and let me know that they were unable to assign a purple man due to the room not being ready. Charge nurse made aware

## 2021-12-28 NOTE — Procedures (Signed)
   Procedure Note  Date: 12/28/2021  Procedure: tube thoracostomy--right    Pre-op diagnosis: right hemothorax  Post-op diagnosis: same  Surgeon: Diamantina Monks, MD  Anesthesia: local   EBL: <5cc procedural; 950cc blood evacuated Drains/Implants: 27F chest tube Specimen: none  Description of procedure: Time-out was performed verifying correct patient, procedure, site, laterality, and signature of informed consent. Fifteen cc's of local anesthetic was infiltrated into the tissues just over the fourth intercostal space.  A longitudinal incision was made parallel to the rib at the fourth intercostal space. This incision was deepened down through the muscle until the pleural cavity was entered. Blood was encountered upon entry and a 27F chest tube was inserted through this tract.   The tube was secured at 16cm at the skin with suture and connected to an atrium at -20cm water wall suction. Immediate output from the chest tube was 950cc and was bloody. The site was dressed with xeroform, gauze, and tape. The patient tolerated the procedure well. There were no complications. Follow up chest x-ray was ordered to confirm tube positioning, complete evacuation, and complete lung re-expansion.    Diamantina Monks, MD General and Trauma Surgery [facility]

## 2021-12-28 NOTE — ED Notes (Signed)
Trauma Response Nurse Documentation   Jacob Becker is a 88 y.o. male arriving to [facility] ED via EMS.  On Xarelto for Afib. Trauma was activated as a Level 1 based on the following trauma criteria Anytime Systolic Blood Pressure < 90. Trauma team at the bedside on patient arrival.   Patient cleared for CT by Dr. Janee Morn. Pt transported to CT with trauma response nurse present to monitor. RN remained with the patient throughout their absence from the department for clinical observation. GCS 14-15, some mild baseline dementia.  History   History reviewed. No pertinent past medical history.   History reviewed. No pertinent surgical history.   Initial Focused Assessment (If applicable, or please see trauma documentation): Patient A&O, some forgetfulness or repetitive questioning Airway intact, diminished lung sounds on right Pulses 1+ No other obvious trauma  CT's Completed:   CT Head, CT C-Spine, CT Chest w/ contrast, and CT abdomen/pelvis w/ contrast   Interventions:  IV, labs Septic workup CXR/PXR CT Head/Cspine + C/A/P add ons Chest tube placed by Lovick MD for R hemothorax  Plan for disposition:  Admission to floor   Event Summary: Patient to ED via EMS after falling while going to let a visitor in the front door. No LOC, SBP 80 and 80 on arrival to ED. Abrasion to forehead. Prior sutures to L elbow. Imaging revealed R hemothorax, R chest tube placed with return of 950cc dark blood. Verbal consent No other traumatic injury identified. Patient to be admitted by hospitalist, Trauma consult, tele. Daughter at bedside.  Bedside handoff with ED RN Jacob Becker.    Jill Side Khylie Larmore  Trauma Response RN  Please call TRN at [phone] for further assistance.

## 2021-12-28 NOTE — Consult Note (Signed)
Reason for Consult:level one after fall Referring Physician: Alvino Blood  Jacob Becker is an 88 y.o. male.  HPI: 88yo M on Xarelto for a fib fell in his den. He says he felt weak. No apparent LOC. SBP on scene was 88 so he came in as a level 1 trauma. On arrival, GCS 15, denies pain.  No past medical history on file.   No family history on file.  Social History:  has no history on file for tobacco use, alcohol use, and drug use.  Allergies: Not on File  Medications: pending  No results found for this or any previous visit (from the past 48 hour(s)).  No results found.  Review of Systems  Constitutional:  Positive for activity change.  HENT:         Hit L side of face  Eyes: Negative.   Respiratory: Negative.    Cardiovascular: Negative.   Gastrointestinal: Negative.   Endocrine: Negative.   Genitourinary: Negative.   Musculoskeletal: Negative.   Allergic/Immunologic: Negative.   Neurological: Negative.   Hematological: Negative.   Psychiatric/Behavioral: Negative.     There were no vitals taken for this visit. Physical Exam HENT:     Head:     Comments: Abrasion/contusion lateral L orbit Cardiovascular:     Rate and Rhythm: Normal rate. Rhythm irregular.     Pulses: Normal pulses.     Heart sounds: Normal heart sounds.  Pulmonary:     Breath sounds: Normal breath sounds.  Abdominal:     General: Abdomen is flat. There is distension.     Palpations: Abdomen is soft.     Tenderness: There is no abdominal tenderness. There is no guarding.  Musculoskeletal:     Cervical back: No tenderness.     Comments: Abrasions B knees, some older  Neurological:     Mental Status: He is alert and oriented to person, place, and time.     Cranial Nerves: No cranial nerve deficit.  Psychiatric:        Mood and Affect: Mood normal.     Assessment/Plan: 88yo fall on Xarelto - GCS 15, abrasion/contusion L lateral orbit, SBP 99 after fluids. Discussed with EDP.  Downgrade to level 2. CT head and medical W/U. Trauma is available as needed.  Liz Malady 12/28/2021, 10:28 AM

## 2021-12-28 NOTE — Progress Notes (Signed)
Patient continue to have urinary retention, and unable to do I&O cath at this point and patient unable to do self cath due to chest tube.  For now, will place Foley until patient able to cath himself.

## 2021-12-28 NOTE — ED Notes (Signed)
Pt trying to get out of bed again. Pt placed back in bed, pt placed back on monitor

## 2021-12-29 ENCOUNTER — Inpatient Hospital Stay (HOSPITAL_COMMUNITY): Payer: Medicare Other

## 2021-12-29 DIAGNOSIS — R55 Syncope and collapse: Secondary | ICD-10-CM | POA: Diagnosis not present

## 2021-12-29 DIAGNOSIS — J942 Hemothorax: Secondary | ICD-10-CM | POA: Diagnosis not present

## 2021-12-29 LAB — BPAM RBC
Blood Product Expiration Date: 202312222359
ISSUE DATE / TIME: 202312011759
Unit Type and Rh: 8400

## 2021-12-29 LAB — BASIC METABOLIC PANEL
Anion gap: 8 (ref 5–15)
BUN: 53 mg/dL — ABNORMAL HIGH (ref 8–23)
CO2: 21 mmol/L — ABNORMAL LOW (ref 22–32)
Calcium: 7.9 mg/dL — ABNORMAL LOW (ref 8.9–10.3)
Chloride: 109 mmol/L (ref 98–111)
Creatinine, Ser: 1.7 mg/dL — ABNORMAL HIGH (ref 0.61–1.24)
GFR, Estimated: 38 mL/min — ABNORMAL LOW (ref >60)
Glucose, Bld: 136 mg/dL — ABNORMAL HIGH (ref 70–99)
Potassium: 3.7 mmol/L (ref 3.5–5.1)
Sodium: 138 mmol/L (ref 135–145)

## 2021-12-29 LAB — ECHOCARDIOGRAM COMPLETE
AR max vel: 2.62 cm2
AV Area VTI: 2.55 cm2
AV Area mean vel: 2.51 cm2
AV Mean grad: 8.3 mmHg
AV Peak grad: 15.6 mmHg
Ao pk vel: 1.98 m/s
Height: 70 in
S' Lateral: 2.7 cm
Weight: 2400 oz

## 2021-12-29 LAB — CBC
HCT: 25.7 % — ABNORMAL LOW (ref 39.0–52.0)
Hemoglobin: 8.4 g/dL — ABNORMAL LOW (ref 13.0–17.0)
MCH: 31 pg (ref 26.0–34.0)
MCHC: 32.7 g/dL (ref 30.0–36.0)
MCV: 94.8 fL (ref 80.0–100.0)
Platelets: 129 10*3/uL — ABNORMAL LOW (ref 150–400)
RBC: 2.71 MIL/uL — ABNORMAL LOW (ref 4.22–5.81)
RDW: 15.6 % — ABNORMAL HIGH (ref 11.5–15.5)
WBC: 10.6 10*3/uL — ABNORMAL HIGH (ref 4.0–10.5)
nRBC: 0 % (ref 0.0–0.2)

## 2021-12-29 LAB — TYPE AND SCREEN
ABO/RH(D): AB POS
Antibody Screen: NEGATIVE
Unit division: 0

## 2021-12-29 LAB — GLUCOSE, CAPILLARY: Glucose-Capillary: 144 mg/dL — ABNORMAL HIGH (ref 70–99)

## 2021-12-29 MED ORDER — POLYETHYLENE GLYCOL 3350 17 G PO PACK
17.0000 g | PACK | Freq: Every day | ORAL | Status: DC | PRN
  Administered 2022-01-05: 17 g via ORAL
  Filled 2021-12-29: qty 1

## 2021-12-29 MED ORDER — LIDOCAINE 5 % EX PTCH
1.0000 | MEDICATED_PATCH | CUTANEOUS | Status: DC
  Administered 2021-12-29 – 2022-01-10 (×7): 1 via TRANSDERMAL
  Filled 2021-12-29 (×10): qty 1

## 2021-12-29 MED ORDER — TRAMADOL HCL 50 MG PO TABS
50.0000 mg | ORAL_TABLET | Freq: Four times a day (QID) | ORAL | Status: DC | PRN
  Administered 2022-01-04 – 2022-01-09 (×3): 50 mg via ORAL
  Filled 2021-12-29 (×3): qty 1

## 2021-12-29 MED ORDER — ENZALUTAMIDE 40 MG PO TABS
160.0000 mg | ORAL_TABLET | Freq: Every day | ORAL | Status: DC
  Administered 2021-12-29 – 2022-01-09 (×12): 160 mg via ORAL
  Filled 2021-12-29 (×12): qty 4

## 2021-12-29 MED ORDER — ACETAMINOPHEN 650 MG RE SUPP: 650.0000 mg | Freq: Four times a day (QID) | RECTAL | Status: DC

## 2021-12-29 MED ORDER — FENTANYL CITRATE PF 50 MCG/ML IJ SOSY
12.5000 ug | PREFILLED_SYRINGE | Freq: Four times a day (QID) | INTRAMUSCULAR | Status: DC | PRN
  Administered 2021-12-30: 12.5 ug via INTRAVENOUS
  Filled 2021-12-29: qty 1

## 2021-12-29 MED ORDER — METHOCARBAMOL 500 MG PO TABS
500.0000 mg | ORAL_TABLET | Freq: Four times a day (QID) | ORAL | Status: DC | PRN
  Administered 2022-01-03 – 2022-01-09 (×4): 500 mg via ORAL
  Filled 2021-12-29 (×4): qty 1

## 2021-12-29 MED ORDER — DOCUSATE SODIUM 100 MG PO CAPS
100.0000 mg | ORAL_CAPSULE | Freq: Two times a day (BID) | ORAL | Status: DC
  Administered 2021-12-29 – 2022-01-10 (×22): 100 mg via ORAL
  Filled 2021-12-29 (×24): qty 1

## 2021-12-29 MED ORDER — ACETAMINOPHEN 325 MG PO TABS
650.0000 mg | ORAL_TABLET | Freq: Four times a day (QID) | ORAL | Status: DC
  Administered 2021-12-29 – 2021-12-30 (×2): 650 mg via ORAL
  Filled 2021-12-29: qty 2

## 2021-12-29 MED ORDER — BISACODYL 10 MG RE SUPP: 10.0000 mg | Freq: Every day | RECTAL | Status: DC | PRN

## 2021-12-29 NOTE — Progress Notes (Signed)
Received patient from ED, alert and confused, 4 point restraints. With scattered bruises and abrasion in arm, leg, back and abdomen. With blanchable redness in left buttock, sacral foam in placed. Offered food and drink but refused, unable to give PO meds. Chest tube site is intact, connected to wall suction with bloody output.

## 2021-12-29 NOTE — Progress Notes (Signed)
[facility] Progress Note     Subjective: CC-  Urology just placed catheter due to retention. Patient only complaining of pain from catheter. Denies chest pain or SOB. Chest tube with 1050cc output.  Objective: Vital signs in last 24 hours: Temp:  [97.1 F (36.2 C)-98.1 F (36.7 C)] 97.8 F (36.6 C) (12/02 0811) Pulse Rate:  [56-79] 60 (12/02 0811) Resp:  [12-26] 17 (12/02 0811) BP: (81-146)/(52-109) 126/52 (12/02 0811) SpO2:  [91 %-100 %] 99 % (12/02 0811) Weight:  [68 kg] 68 kg (12/01 1032) Last BM Date :  (unknown)  Intake/Output from previous day: 12/01 0701 - 12/02 0700 In: 1121.5 [I.V.:1121.5] Out: 1100 [Urine:50; Chest Tube:1050] Intake/Output this shift: No intake/output data recorded.  PE: Gen:  Alert, NAD Card:  RRR Pulm:  CTAB, no W/R/R, rate and effort normal on room air. 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   BMET Recent Labs    12/28/21 1529 12/29/21 0222  NA 140 138  K 5.0 3.7  CL 104 109  CO2 21* 21*  GLUCOSE 158* 136*  BUN 48* 53*  CREATININE 1.83* 1.70*  CALCIUM 9.1 7.9*   PT/INR Recent Labs    12/28/21 1043  LABPROT 21.5*  INR 1.9*   CMP     Component Value Date/Time   NA 138 12/29/2021 0222   K 3.7 12/29/2021 0222   CL 109 12/29/2021 0222   CO2 21 (L) 12/29/2021 0222   GLUCOSE 136 (H) 12/29/2021 0222   BUN 53 (H) 12/29/2021 0222   CREATININE 1.70 (H) 12/29/2021 0222   CALCIUM 7.9 (L) 12/29/2021  0222   PROT 5.4 (L) 12/28/2021 1043   ALBUMIN 2.5 (L) 12/28/2021 1043   AST 32 12/28/2021 1043   ALT 17 12/28/2021 1043   ALKPHOS 126 12/28/2021 1043   BILITOT 0.6 12/28/2021 1043   GFRNONAA 38 (L) 12/29/2021 0222   Lipase  No results found for: "LIPASE"     Studies/Results: DG CHEST PORT 1 VIEW  Result Date: 12/29/2021 CLINICAL DATA:  Right-sided hemothorax EXAM: PORTABLE CHEST 1 VIEW COMPARISON:  Prior chest x-ray 12/28/2021 FINDINGS: Stable position of right-sided thoracostomy tube. No definite pneumothorax. Mild dependent atelectasis. The lungs are otherwise clear. Atherosclerotic calcification present in the transverse aorta. IMPRESSION: Stable position of right-sided chest tube. No evidence of pneumothorax. Mild bibasilar atelectasis. Electronically Signed   By: Jacqulynn Cadet M.D.   On: 12/29/2021 07:58   CT ABDOMEN PELVIS WO CONTRAST  Result Date: 12/28/2021 CLINICAL DATA:  Abdominal trauma, blunt EXAM: CT ABDOMEN AND PELVIS WITHOUT CONTRAST TECHNIQUE: Multidetector CT imaging of the abdomen and pelvis was performed following the standard protocol without IV contrast. RADIATION DOSE REDUCTION: This exam was performed according to the departmental dose-optimization program which includes automated exposure control, adjustment of the mA and/or kV according to  patient size and/or use of iterative reconstruction technique. COMPARISON:  CT CAP, 01/19/2021 and 02/21/2020. FINDINGS: Lower chest: RIGHT basilar consolidation, likely atelectasis. Hepatobiliary: Multifocal hepatic hypodense lesions previously characterized as cysts. Largest hypodense lesion is within the RIGHT hepatic lobe, measures approximately 11.5 cm and with intermediate density. No gallstones, gallbladder wall thickening, or biliary dilatation. Pancreas: Atrophic with pancreatic head cystic lesion, best appreciated on comparison contrasted CT. No main pancreatic ductal dilatation or surrounding inflammatory changes.  Spleen: Normal in size without focal abnormality. Inferior pole accessory spleen. Adrenals/Urinary Tract: Adrenal glands are unremarkable. Senescent renal changes, with large LEFT midpolar exophytic hypodense lesion consistent with a simple renal cyst - no follow-up indicated. No renal calculi or hydronephrosis. Bladder is unremarkable. Stomach/Bowel: Stomach is within normal limits. Appendix appears normal. No evidence of bowel wall thickening, distention, or inflammatory changes. Vascular/Lymphatic: Aortic atherosclerosis without aneurysmal dilation. No enlarged abdominal or pelvic lymph nodes. Reproductive: Nonenlarged prostate. Other: No abdominal wall hernia or abnormality. No abdominopelvic ascites. Musculoskeletal: Gynecomastia. Diffuse osseous metastases. Nondisplaced RIGHT anterior acetabular rim fracture. See key image. IMPRESSION: 1. Nondisplaced RIGHT anterior acetabular rim fracture. 2. Diffuse osseous metastasis. 3. 12 cm large intermediate density RIGHT hepatic lobe lesion, previously characterized as cyst on contrasted CT comparison. Additional incidental, chronic and senescent findings as above. Electronically Signed   By: Roanna Banning M.D.   On: 12/28/2021 15:40   DG Chest Portable 1 View  Result Date: 12/28/2021 CLINICAL DATA:  Chest tube placement EXAM: PORTABLE CHEST 1 VIEW COMPARISON:  Same day chest x-ray and CT FINDINGS: Interval placement of right-sided chest tube. Improved aeration of the right lung with decreased right hemothorax. No significant residual pleural fluid is evident. Improving right basilar atelectasis. Left lung is clear. No pneumothorax. Stable heart size. Aortic atherosclerosis. IMPRESSION: Interval placement of right-sided chest tube with improved aeration of the right lung and decreased right hemothorax. Electronically Signed   By: Duanne Guess D.O.   On: 12/28/2021 14:48   CT Chest Wo Contrast  Result Date: 12/28/2021 CLINICAL DATA:  Respiratory illness EXAM:  CT CHEST WITHOUT CONTRAST TECHNIQUE: Multidetector CT imaging of the chest was performed following the standard protocol without IV contrast. RADIATION DOSE REDUCTION: This exam was performed according to the departmental dose-optimization program which includes automated exposure control, adjustment of the mA and/or kV according to patient size and/or use of iterative reconstruction technique. COMPARISON:  CT chest, and pelvis dated January 19, 2021 FINDINGS: Cardiovascular: Mild cardiomegaly. No pericardial effusion. Normal caliber thoracic aorta with moderate calcified plaque. Mitral annular calcifications. Severe left main and three-vessel coronary artery calcifications. Mediastinum/Nodes: Esophagus and thyroid are unremarkable. No pathologically enlarged lymph nodes seen in the chest. Lungs/Pleura: Central airways are patent. Moderate right pleural effusion with associated hyperdensity. Linear ground-glass opacities of the right hemithorax, likely due to atelectasis. Upper Abdomen: Unchanged appearance of large complex cyst of the right hepatic dome, likely due to prior hemorrhage. Additional low-attenuation simple hepatic cysts, unchanged when compared to prior. Bilateral low-attenuation renal lesions which are likely simple cysts, no specific follow-up imaging is recommended. Unchanged left renal artery aneurysm measuring 1.5 cm. No acute abnormality. Musculoskeletal: Multiple sclerotic lesions involving the thoracic spine, ribs and sternum, unchanged when compared with prior exam. Mildly displaced posterolateral right 10th and 11th rib fractures. IMPRESSION: 1. Mildly displaced posterolateral right 10th and 11th rib fractures. 2. Moderate right hemothorax. 3. Stable sclerotic skeletal metastases, compatible with history of prostate cancer. 4. Aortic Atherosclerosis (ICD10-I70.0). Critical Value/emergent results were called by telephone at  the time of interpretation on 12/28/2021 at 12:58 pm to provider  Garnette Gunner , who verbally acknowledged these results. Electronically Signed   By: Yetta Glassman M.D.   On: 12/28/2021 13:03   CT HEAD WO CONTRAST  Result Date: 12/28/2021 CLINICAL DATA:  Trauma. EXAM: CT HEAD WITHOUT CONTRAST CT CERVICAL SPINE WITHOUT CONTRAST TECHNIQUE: Multidetector CT imaging of the head and cervical spine was performed following the standard protocol without intravenous contrast. Multiplanar CT image reconstructions of the cervical spine were also generated. RADIATION DOSE REDUCTION: This exam was performed according to the departmental dose-optimization program which includes automated exposure control, adjustment of the mA and/or kV according to patient size and/or use of iterative reconstruction technique. COMPARISON:  12/21/21 CT head FINDINGS: CT HEAD FINDINGS Brain: No evidence of acute infarction, hemorrhage, hydrocephalus, extra-axial collection or mass lesion/mass effect. There is sequela of moderate chronic microvascular ischemic change with advanced generalized volume loss and parietal lobe predominance. Vascular: No hyperdense vessel or unexpected calcification. Skull: Normal. Negative for fracture or focal lesion. Sinuses/Orbits: Bilateral lens replacement. Other: None. CT CERVICAL SPINE FINDINGS Alignment: There is grade 1 anterolisthesis of C4 on C5 and C7 on T1. There is mild retrolisthesis of C5 on C6. Skull base and vertebrae: No acute fracture. No primary bone lesion or focal pathologic process. Soft tissues and spinal canal: No prevertebral fluid or swelling. No visible canal hematoma. Disc levels: There is severe multilevel facet degenerative change with fusion of the facet joints at C2-C3 on the left and C3-C4 and C4-C5 on the right. There is also fusion of the C2-C4 vertebral bodies. Upper chest: Right-sided pleural effusion. Other: Negative IMPRESSION: 1. No acute intracranial abnormality. 2. No acute fracture or traumatic malalignment of the cervical spine.  3. Advanced generalized volume loss and sequela of moderate chronic microvascular ischemic change. 4. Right-sided pleural effusion, which subjectively appears hyperdense. If there is concern for a rib fracture/thoracic injury, this could represent pleural blood products. Recommend further evaluation with a dedicated CT chest. Electronically Signed   By: Marin Roberts M.D.   On: 12/28/2021 11:54   CT CERVICAL SPINE WO CONTRAST  Result Date: 12/28/2021 CLINICAL DATA:  Trauma. EXAM: CT HEAD WITHOUT CONTRAST CT CERVICAL SPINE WITHOUT CONTRAST TECHNIQUE: Multidetector CT imaging of the head and cervical spine was performed following the standard protocol without intravenous contrast. Multiplanar CT image reconstructions of the cervical spine were also generated. RADIATION DOSE REDUCTION: This exam was performed according to the departmental dose-optimization program which includes automated exposure control, adjustment of the mA and/or kV according to patient size and/or use of iterative reconstruction technique. COMPARISON:  12/21/21 CT head FINDINGS: CT HEAD FINDINGS Brain: No evidence of acute infarction, hemorrhage, hydrocephalus, extra-axial collection or mass lesion/mass effect. There is sequela of moderate chronic microvascular ischemic change with advanced generalized volume loss and parietal lobe predominance. Vascular: No hyperdense vessel or unexpected calcification. Skull: Normal. Negative for fracture or focal lesion. Sinuses/Orbits: Bilateral lens replacement. Other: None. CT CERVICAL SPINE FINDINGS Alignment: There is grade 1 anterolisthesis of C4 on C5 and C7 on T1. There is mild retrolisthesis of C5 on C6. Skull base and vertebrae: No acute fracture. No primary bone lesion or focal pathologic process. Soft tissues and spinal canal: No prevertebral fluid or swelling. No visible canal hematoma. Disc levels: There is severe multilevel facet degenerative change with fusion of the facet joints at C2-C3 on  the left and C3-C4 and C4-C5 on the right. There is also fusion of the C2-C4 vertebral  bodies. Upper chest: Right-sided pleural effusion. Other: Negative IMPRESSION: 1. No acute intracranial abnormality. 2. No acute fracture or traumatic malalignment of the cervical spine. 3. Advanced generalized volume loss and sequela of moderate chronic microvascular ischemic change. 4. Right-sided pleural effusion, which subjectively appears hyperdense. If there is concern for a rib fracture/thoracic injury, this could represent pleural blood products. Recommend further evaluation with a dedicated CT chest. Electronically Signed   By: Marin Roberts M.D.   On: 12/28/2021 11:54   DG Chest Port 1 View  Result Date: 12/28/2021 CLINICAL DATA:  Fall at home.  On blood thinners.  Hypotension. EXAM: PORTABLE CHEST 1 VIEW COMPARISON:  Chest CT 01/19/2021.  No recent radiographs. FINDINGS: 1034 hours. Two views submitted. The heart size and mediastinal contours are stable with aortic atherosclerosis and tortuosity. There is new asymmetric opacity in the right hemithorax suspicious for a layering pleural effusion. Mild associated right basilar atelectasis or infiltrate. No evidence of pneumothorax. The left lung appears clear. No acute fractures are identified. Known osseous metastatic disease from prostate cancer is not well seen on these images. Telemetry leads overlie the chest. IMPRESSION: New asymmetric opacity in the right hemithorax suspicious for a layering pleural effusion with associated right basilar atelectasis or infiltrate. Follow-up PA and lateral views recommended when possible. No evidence of pneumothorax. Electronically Signed   By: Richardean Sale M.D.   On: 12/28/2021 10:49    Anti-infectives: Anti-infectives (From admission, onward)    Start     Dose/Rate Route Frequency Ordered Stop   12/28/21 1215  cefTRIAXone (ROCEPHIN) 1 g in sodium chloride 0.9 % 100 mL IVPB  Status:  Discontinued        1 g 200 mL/hr  over 30 Minutes Intravenous  Once 12/28/21 1205 12/28/21 1320   12/28/21 1215  azithromycin (ZITHROMAX) 500 mg in sodium chloride 0.9 % 250 mL IVPB  Status:  Discontinued        500 mg 250 mL/hr over 60 Minutes Intravenous  Once 12/28/21 1205 12/28/21 1320        Assessment/Plan Fall on xarelto R hemothorax - s/p chest tube placement 12/2. No PNX on CXR this morning but has had high volume output. Continue -20 suction today and repeat CXR in AM R rib fractures 10-11 - multimodal pain control and pulm toilet ABL anemia - s/p 1u PRBCs 12/1. Hgb 8.4 from 8.6, continue to hold anticoagulation and monitor Right anterior acetabular rib fx - will ask ortho, Dr. Mable Fill, to see Chronic A fib on xarelto - hold anticoagulation Chronic urinary retention - I&O caths at home. Foley placed by urology 12/2 Prostate cancer HTN HLD  ID - none currently FEN - IVF per [facility], HH/CM diet VTE - SCDs, hold xarelto Foley - placed by urology 12/2  Dispo - Chest tube as above. Ortho consult. Will need PT/OT pending ortho recs. Pain medication adjusted to avoid narcotics.  I reviewed hospitalist notes, last 24 h vitals and pain scores, last 48 h intake and output, last 24 h labs and trends, and last 24 h imaging results.    LOS: 1 day    Wellington Hampshire, [facility] 12/29/2021, 8:21 AM Please see Amion for pager number during day hours 7:00am-4:30pm

## 2021-12-29 NOTE — Consult Note (Signed)
Orthopaedic Consult  Date/Time: 12/29/21 11:48 AM  Patient Name: Jacob Becker  Attending Physician: Geradine Girt, DO    ASSESSMENT & PLAN  Orthopaedic Assessment: 88 y.o. male with right nondisplaced anterior wall acetabulum fracture (S32.414A).  Plan: Imaging findings were discussed with the family at the bedside.  I explained that the CT is concerning for a possible right nondisplaced anterior wall acetabular fracture, but that this does not appear to extend into the weightbearing dome of the acetabulum, and is likely a stable fracture.  My recommendation is therefore for treatmeant of the right nondisplaced anterior wall acetabulum fracture (S32.414A) is closed treatment, without manipulation [id number]  Once stable and medically appropriate, recommend physical therapy/Occupational Therapy, out of bed, and weightbearing as tolerated right lower extremity.  Continue to follow clinically, with repeat imaging if indicated, particularly if patient unable to mobilize due to ongoing right hip pain.  Remainder of care per primary team.   Georgeanna Harrison M.D. Orthopaedic Surgery [facility]   Medical Decision Making  Amount/complexity of data: Is there a current pathologic fracture (e.g. neoplastic, osteoporotic insufficiency fracture)? Yes Independent interpretation of radiographic studies: Yes Review of radiology results (e.g. reports): Yes Tests ordered (e.g. additional radiographic studies, labs): No Lab results reviewed: Yes Reviewed old records: Yes History from another source (independent historian, e.g. family/friend/etc.): Yes Discussion of imaging, clinical data, and or management with independent medical provider: Yes Risk: Patient receiving IV controlled substances for pain: Yes Fracture requiring manipulation: No Urgent or emergent (non-elective) surgery likely this admission: No Presence of medical comorbidities and/or surgical risk factors  (e.g. current smoker, CAD, diabetes, COPD, CKD, etc.): Yes Closed fracture management WITHOUT manipulation: Yes Urgent minor procedure (e.g. joint aspiration, compartment pressure measurement, etc.): No Will likely need surgery as an outpatient: No     HPI Jacob Becker is a 88 y.o. male. Orthopaedic consultation has specifically been requested to address this patient's current musculoskeletal presentation.  He apparently sustained a unwitnessed mechanical fall at his home on Friday, 12/28/2021.  He was later helped by his family members to a chair in the living room, but was later found on the floor of the living room the following morning.  After the initial fall, he was apparently able to ambulate for several steps with assistance and with a walker.  At the time of evaluation, he is somnolent and unresponsive and unable to contribute to the history or participate in examination.  History was obtained from his daughter and son-in-law who are present in the room at the bedside.  Prior to this injury, he was ambulatory with a walking stick.   PMH History reviewed. No pertinent past medical history.   [place] History reviewed. No pertinent surgical history. Home Medications Prior to Admission medications   Medication Sig Start Date End Date Taking? Authorizing Provider  acetaminophen (TYLENOL) 500 MG tablet Take 1,000 mg by mouth at bedtime as needed for moderate pain or mild pain.   Yes [provider]  amLODipine (NORVASC) 10 MG tablet Take 10 mg by mouth daily. 12/25/21  Yes [provider]  calcium citrate (CALCITRATE - DOSED IN MG ELEMENTAL CALCIUM) 950 (200 Ca) MG tablet Take 500 mg of elemental calcium by mouth 2 (two) times daily.   Yes [provider]  Coenzyme Q10 (COQ10) 100 MG CAPS Take 1 capsule by mouth daily.   Yes [provider]  colesevelam (WELCHOL) 625 MG tablet Take 1,250 mg by mouth 2 (two) times daily.  12/25/21  Yes [provider]   Ensure (ENSURE) Take 237 mLs by mouth daily as needed (as directed).   Yes [provider]  metoprolol succinate (TOPROL-XL) 100 MG 24 hr tablet See admin instructions. Sig from [place]:  Take one tablet (100 mg) by mouth in the morning and half tablet (50 mg) by mouth in the evening. 12/21/21  Yes [provider]  Omega-3 Fatty Acids (FISH OIL) 1000 MG CAPS Take 1 capsule by mouth in the morning and at bedtime.   Yes [provider]  ramipril (ALTACE) 10 MG capsule Take 10 mg by mouth 2 (two) times daily. 12/21/21  Yes [provider]  rosuvastatin (CRESTOR) 40 MG tablet Take 40 mg by mouth daily. 12/21/21  Yes [provider]  triamterene-hydrochlorothiazide (MAXZIDE-25) 37.5-25 MG tablet Take 2 tablets by mouth daily. 12/21/21  Yes [provider]  XARELTO 20 MG TABS tablet Take 20 mg by mouth daily. 12/21/21  Yes [provider]  XTANDI 40 MG tablet Take 80 mg by mouth 2 (two) times daily. 12/14/21  Yes [provider]     Allergies Not on File   Family History History reviewed. No pertinent family history.  Social History Social History   Socioeconomic History   Marital status: Married    Spouse name: Not on file   Number of children: Not on file   Years of education: Not on file   Highest education level: Not on file  Occupational History   Not on file  Tobacco Use   Smoking status: Not on file   Smokeless tobacco: Not on file  Substance and Sexual Activity   Alcohol use: Not on file   Drug use: Not on file   Sexual activity: Not on file  Other Topics Concern   Not on file  Social History Narrative   Not on file   Social Determinants of Health   Financial Resource Strain: Not on file  Food Insecurity: Not on file  Transportation Needs: Not on file  Physical Activity: Not on file  Stress: Not on file  Social Connections: Not on file  Intimate Partner Violence: Not on file     Review  of Systems MSK: As noted per HPI above GI: No current Nausea/vomiting ENT: No epistaxis CV: Unable to endorse or denying chest pain due to mental status Resp: Rib fractures and chest tube  Other than mentioned above, there are no Constitutional, Neurological, Psychiatric, ENT, Ophthalmological, Cardiovascular, Respiratory, GI, GU, Musculoskeletal, Integumentary, Lymphatic, Endocrine or Allergic issues.     Imaging  Independent interpretation of orthopaedic-relevant films: CT of the pelvis independently reviewed.  On a single axial slice, there is concern for a nondisplaced fracture through the anterior wall.  This does not appear to involve the weightbearing dome portion of the acetabulum.  Radiographic results: DG CHEST PORT 1 VIEW  Result Date: 12/29/2021 CLINICAL DATA:  Right-sided hemothorax EXAM: PORTABLE CHEST 1 VIEW COMPARISON:  Prior chest x-ray 12/28/2021 FINDINGS: Stable position of right-sided thoracostomy tube. No definite pneumothorax. Mild dependent atelectasis. The lungs are otherwise clear. Atherosclerotic calcification present in the transverse aorta. IMPRESSION: Stable position of right-sided chest tube. No evidence of pneumothorax. Mild bibasilar atelectasis. Electronically Signed   By: Jacqulynn Cadet M.D.   On: 12/29/2021 07:58   CT ABDOMEN PELVIS WO CONTRAST  Result Date: 12/28/2021 CLINICAL DATA:  Abdominal trauma, blunt EXAM: CT ABDOMEN AND PELVIS WITHOUT CONTRAST TECHNIQUE: Multidetector CT imaging of the abdomen and pelvis was  performed following the standard protocol without IV contrast. RADIATION DOSE REDUCTION: This exam was performed according to the departmental dose-optimization program which includes automated exposure control, adjustment of the mA and/or kV according to patient size and/or use of iterative reconstruction technique. COMPARISON:  CT CAP, 01/19/2021 and 02/21/2020. FINDINGS: Lower chest: RIGHT basilar consolidation, likely atelectasis.  Hepatobiliary: Multifocal hepatic hypodense lesions previously characterized as cysts. Largest hypodense lesion is within the RIGHT hepatic lobe, measures approximately 11.5 cm and with intermediate density. No gallstones, gallbladder wall thickening, or biliary dilatation. Pancreas: Atrophic with pancreatic head cystic lesion, best appreciated on comparison contrasted CT. No main pancreatic ductal dilatation or surrounding inflammatory changes. Spleen: Normal in size without focal abnormality. Inferior pole accessory spleen. Adrenals/Urinary Tract: Adrenal glands are unremarkable. Senescent renal changes, with large LEFT midpolar exophytic hypodense lesion consistent with a simple renal cyst - no follow-up indicated. No renal calculi or hydronephrosis. Bladder is unremarkable. Stomach/Bowel: Stomach is within normal limits. Appendix appears normal. No evidence of bowel wall thickening, distention, or inflammatory changes. Vascular/Lymphatic: Aortic atherosclerosis without aneurysmal dilation. No enlarged abdominal or pelvic lymph nodes. Reproductive: Nonenlarged prostate. Other: No abdominal wall hernia or abnormality. No abdominopelvic ascites. Musculoskeletal: Gynecomastia. Diffuse osseous metastases. Nondisplaced RIGHT anterior acetabular rim fracture. See key image. IMPRESSION: 1. Nondisplaced RIGHT anterior acetabular rim fracture. 2. Diffuse osseous metastasis. 3. 12 cm large intermediate density RIGHT hepatic lobe lesion, previously characterized as cyst on contrasted CT comparison. Additional incidental, chronic and senescent findings as above. Electronically Signed   By: Roanna Banning M.D.   On: 12/28/2021 15:40   DG Chest Portable 1 View  Result Date: 12/28/2021 CLINICAL DATA:  Chest tube placement EXAM: PORTABLE CHEST 1 VIEW COMPARISON:  Same day chest x-ray and CT FINDINGS: Interval placement of right-sided chest tube. Improved aeration of the right lung with decreased right hemothorax. No  significant residual pleural fluid is evident. Improving right basilar atelectasis. Left lung is clear. No pneumothorax. Stable heart size. Aortic atherosclerosis. IMPRESSION: Interval placement of right-sided chest tube with improved aeration of the right lung and decreased right hemothorax. Electronically Signed   By: Duanne Guess D.O.   On: 12/28/2021 14:48   CT Chest Wo Contrast  Result Date: 12/28/2021 CLINICAL DATA:  Respiratory illness EXAM: CT CHEST WITHOUT CONTRAST TECHNIQUE: Multidetector CT imaging of the chest was performed following the standard protocol without IV contrast. RADIATION DOSE REDUCTION: This exam was performed according to the departmental dose-optimization program which includes automated exposure control, adjustment of the mA and/or kV according to patient size and/or use of iterative reconstruction technique. COMPARISON:  CT chest, and pelvis dated January 19, 2021 FINDINGS: Cardiovascular: Mild cardiomegaly. No pericardial effusion. Normal caliber thoracic aorta with moderate calcified plaque. Mitral annular calcifications. Severe left main and three-vessel coronary artery calcifications. Mediastinum/Nodes: Esophagus and thyroid are unremarkable. No pathologically enlarged lymph nodes seen in the chest. Lungs/Pleura: Central airways are patent. Moderate right pleural effusion with associated hyperdensity. Linear ground-glass opacities of the right hemithorax, likely due to atelectasis. Upper Abdomen: Unchanged appearance of large complex cyst of the right hepatic dome, likely due to prior hemorrhage. Additional low-attenuation simple hepatic cysts, unchanged when compared to prior. Bilateral low-attenuation renal lesions which are likely simple cysts, no specific follow-up imaging is recommended. Unchanged left renal artery aneurysm measuring 1.5 cm. No acute abnormality. Musculoskeletal: Multiple sclerotic lesions involving the thoracic spine, ribs and sternum, unchanged when  compared with prior exam. Mildly displaced posterolateral right 10th and 11th rib fractures. IMPRESSION: 1. Mildly  displaced posterolateral right 10th and 11th rib fractures. 2. Moderate right hemothorax. 3. Stable sclerotic skeletal metastases, compatible with history of prostate cancer. 4. Aortic Atherosclerosis (ICD10-I70.0). Critical Value/emergent results were called by telephone at the time of interpretation on 12/28/2021 at 12:58 pm to provider Garnette Gunner , who verbally acknowledged these results. Electronically Signed   By: Yetta Glassman M.D.   On: 12/28/2021 13:03   CT HEAD WO CONTRAST  Result Date: 12/28/2021 CLINICAL DATA:  Trauma. EXAM: CT HEAD WITHOUT CONTRAST CT CERVICAL SPINE WITHOUT CONTRAST TECHNIQUE: Multidetector CT imaging of the head and cervical spine was performed following the standard protocol without intravenous contrast. Multiplanar CT image reconstructions of the cervical spine were also generated. RADIATION DOSE REDUCTION: This exam was performed according to the departmental dose-optimization program which includes automated exposure control, adjustment of the mA and/or kV according to patient size and/or use of iterative reconstruction technique. COMPARISON:  12/21/21 CT head FINDINGS: CT HEAD FINDINGS Brain: No evidence of acute infarction, hemorrhage, hydrocephalus, extra-axial collection or mass lesion/mass effect. There is sequela of moderate chronic microvascular ischemic change with advanced generalized volume loss and parietal lobe predominance. Vascular: No hyperdense vessel or unexpected calcification. Skull: Normal. Negative for fracture or focal lesion. Sinuses/Orbits: Bilateral lens replacement. Other: None. CT CERVICAL SPINE FINDINGS Alignment: There is grade 1 anterolisthesis of C4 on C5 and C7 on T1. There is mild retrolisthesis of C5 on C6. Skull base and vertebrae: No acute fracture. No primary bone lesion or focal pathologic process. Soft tissues and  spinal canal: No prevertebral fluid or swelling. No visible canal hematoma. Disc levels: There is severe multilevel facet degenerative change with fusion of the facet joints at C2-C3 on the left and C3-C4 and C4-C5 on the right. There is also fusion of the C2-C4 vertebral bodies. Upper chest: Right-sided pleural effusion. Other: Negative IMPRESSION: 1. No acute intracranial abnormality. 2. No acute fracture or traumatic malalignment of the cervical spine. 3. Advanced generalized volume loss and sequela of moderate chronic microvascular ischemic change. 4. Right-sided pleural effusion, which subjectively appears hyperdense. If there is concern for a rib fracture/thoracic injury, this could represent pleural blood products. Recommend further evaluation with a dedicated CT chest. Electronically Signed   By: Marin Roberts M.D.   On: 12/28/2021 11:54   CT CERVICAL SPINE WO CONTRAST  Result Date: 12/28/2021 CLINICAL DATA:  Trauma. EXAM: CT HEAD WITHOUT CONTRAST CT CERVICAL SPINE WITHOUT CONTRAST TECHNIQUE: Multidetector CT imaging of the head and cervical spine was performed following the standard protocol without intravenous contrast. Multiplanar CT image reconstructions of the cervical spine were also generated. RADIATION DOSE REDUCTION: This exam was performed according to the departmental dose-optimization program which includes automated exposure control, adjustment of the mA and/or kV according to patient size and/or use of iterative reconstruction technique. COMPARISON:  12/21/21 CT head FINDINGS: CT HEAD FINDINGS Brain: No evidence of acute infarction, hemorrhage, hydrocephalus, extra-axial collection or mass lesion/mass effect. There is sequela of moderate chronic microvascular ischemic change with advanced generalized volume loss and parietal lobe predominance. Vascular: No hyperdense vessel or unexpected calcification. Skull: Normal. Negative for fracture or focal lesion. Sinuses/Orbits: Bilateral lens  replacement. Other: None. CT CERVICAL SPINE FINDINGS Alignment: There is grade 1 anterolisthesis of C4 on C5 and C7 on T1. There is mild retrolisthesis of C5 on C6. Skull base and vertebrae: No acute fracture. No primary bone lesion or focal pathologic process. Soft tissues and spinal canal: No prevertebral fluid or swelling. No visible canal hematoma. Disc  levels: There is severe multilevel facet degenerative change with fusion of the facet joints at C2-C3 on the left and C3-C4 and C4-C5 on the right. There is also fusion of the C2-C4 vertebral bodies. Upper chest: Right-sided pleural effusion. Other: Negative IMPRESSION: 1. No acute intracranial abnormality. 2. No acute fracture or traumatic malalignment of the cervical spine. 3. Advanced generalized volume loss and sequela of moderate chronic microvascular ischemic change. 4. Right-sided pleural effusion, which subjectively appears hyperdense. If there is concern for a rib fracture/thoracic injury, this could represent pleural blood products. Recommend further evaluation with a dedicated CT chest. Electronically Signed   By: Marin Roberts M.D.   On: 12/28/2021 11:54   DG Chest Port 1 View  Result Date: 12/28/2021 CLINICAL DATA:  Fall at home.  On blood thinners.  Hypotension. EXAM: PORTABLE CHEST 1 VIEW COMPARISON:  Chest CT 01/19/2021.  No recent radiographs. FINDINGS: 1034 hours. Two views submitted. The heart size and mediastinal contours are stable with aortic atherosclerosis and tortuosity. There is new asymmetric opacity in the right hemithorax suspicious for a layering pleural effusion. Mild associated right basilar atelectasis or infiltrate. No evidence of pneumothorax. The left lung appears clear. No acute fractures are identified. Known osseous metastatic disease from prostate cancer is not well seen on these images. Telemetry leads overlie the chest. IMPRESSION: New asymmetric opacity in the right hemithorax suspicious for a layering pleural  effusion with associated right basilar atelectasis or infiltrate. Follow-up PA and lateral views recommended when possible. No evidence of pneumothorax. Electronically Signed   By: Richardean Sale M.D.   On: 12/28/2021 10:49   Labs  Recent Labs    12/28/21 1043 12/28/21 1107 12/28/21 1529 12/28/21 2251 12/29/21 0222  WBC 12.2*  --   --   --  10.6*  HGB 8.2* 7.8* 8.6* 8.6* 8.4*  HCT 25.5* 23.0* 25.7* 24.9* 25.7*  PLT 159  --   --   --  129*   Recent Labs    12/28/21 1043 12/28/21 1107 12/28/21 1529 12/29/21 0222  NA 137 136 140 138  K 5.8* 5.7* 5.0 3.7  CL 106 114* 104 109  CO2 19*  --  21* 21*  BUN 45* 42* 48* 53*  CREATININE 1.95* 1.90* 1.83* 1.70*  GLUCOSE 220* 210* 158* 136*  CALCIUM 8.5*  --  9.1 7.9*   Lab Results  Component Value Date   INR 1.9 (H) 12/28/2021        Physical Examination  Patient is a 88 y.o. year old male who is chronically ill appearing, mood is non-responsive.  Orientation:  Somnolent and unresponsive  Vital Signs: BP (!) 126/52 (BP Location: Right Arm)   Pulse 60   Temp 97.8 F (36.6 C)   Resp 17   Ht 5\' 10"  (1.778 m)   Wt 68 kg   SpO2 99%   BMI 21.52 kg/m    Gait: Somnolent and unresponsive on hospital bed  Heart: Normal rate Lungs: Non-labored breathing Abdomen: Soft, Non-tender   Right Upper Extremity: Inspection: Atraumatic appearance Palpation: No concerning crepitus, no apparent discomfort with palpation ROM: Apparently full painless passive range of motion Strength: Unable to participate in examination due to mental status Sensation: Unable to participate in examination due to mental status Skin: Intact Peripheral Vascular: Normal distal pulses, warm and well-perfused distally Joint Stability: No instability Reflexes: No pathologic Lymph Nodes: None Palpable Coordination: Limited by mental status   Left Upper Extremity: Inspection: Atraumatic appearance Palpation: No concerning crepitus, no apparent  discomfort  with palpation ROM: Apparently full painless passive range of motion Strength: Unable to participate in examination due to mental status Sensation: Unable to participate in examination due to mental status Skin: Intact Peripheral Vascular: Normal distal pulses, warm and well-perfused distally Joint Stability: No instability Reflexes: No pathologic Lymph Nodes: None Palpable Coordination: Limited by mental status  Right Lower Extremity: Inspection: Atraumatic appearance Palpation: No concerning crepitus, no apparent discomfort with palpation ROM: Apparently full painless passive range of motion Strength: Unable to participate in examination due to mental status Sensation: Unable to participate in examination due to mental status Skin: Intact, no ecchymosis, multiple small abrasions Peripheral Vascular: Normal distal pulses, warm and well-perfused distally Joint Stability: No instability Reflexes: No pathologic Lymph Nodes: None Palpable Coordination: Limited by mental status Left Lower Extremity: Inspection: Atraumatic appearance Palpation: No concerning crepitus, no apparent discomfort with palpation ROM: Apparently full painless passive range of motion Strength: Unable to participate in examination due to mental status Sensation: Unable to participate in examination due to mental status Skin: Intact, multiple small abrasions Peripheral Vascular: Normal distal pulses, warm and well-perfused distally Joint Stability: No instability Reflexes: No pathologic Lymph Nodes: None Palpable Coordination: Limited by mental status  Pelvis: Skin: Intact, no ecchymosis Palpation: No apparent discomfort Stability: No instability      The review of the patient's medications does not in any way constitute an endorsement, by this clinician,  of their use, dosage, indications, route, efficacy, interactions, or other clinical parameters.  This note was generated within the [employer] EMR using Dragon  medical speech recognition software and may contain inherent errors or omissions not intended by the user. Grammatical and punctuation errors, random word insertions, deletions, pronoun errors and incomplete sentences are occasional consequences of this technology due to software limitations. Not all errors are caught or corrected.  Although every attempt is made to root out erroneus and incomplete transcription, the note may still not fully represent the intent or opinion of the author. If there are questions or concerns about the content of this note or information contained within the body of this dictation they should be addressed directly with the author for clarification.

## 2021-12-29 NOTE — Progress Notes (Signed)
[facility] night cross cover note:  I was notified by RN that this patient is agitated, pulling at their telemetry leads and peripheral IV, an attempting to get out of bed ,with these behaviors refractory to attempts at verbal redirection.  In the setting of associated interference with ongoing medical treatment posing potential harm to themself, medically knowing that this patient is a fall risk, having presented with fall and multiple broken ribs in hemothorax s/p CT, I have placed orders for soft bilateral wrist/ankle restraints and ordered prn iv haldol for agitation.      Newton Pigg, DO Hospitalist

## 2021-12-29 NOTE — Progress Notes (Signed)
PROGRESS NOTE    Jacob Becker  ZOX:[id number] DOB: 04/09/1929 DOA: 12/28/2021 PCP: Emilio Aspen, MD    Brief Narrative:  Jacob Becker is a 88 y.o. male with medical history significant of advanced dementia, chronic A-fib on Xarelto, HTN, CAD, prostate cancer in remission on chronic hormone manipulation, hepatic and pancreatic cysts, presented with near syncope and fall.   Patient has advanced dementia and only thing he could remember was he started to feel lightheadedness since yesterday evening, and this morning, by accident, he locked himself inside the bedroom, and when family knocked the door from outside, patient did not realize and went to open the door, but lost his balance and fell on the right side of the chest.  No head injury, no LOC.  Daughter noticed the patient looked pale and clammy, and had slurred speech and confusion.  EMS arrived and found patient blood pressure in the lower 80s and bradycardia in the 50s.   According to patient's cardiologist note, patient has hypertension and A-fib but on the recent visit in April 2023, it was noticed the patient develop borderline bradycardia and borderline hypotension and cardiology recommended to cut down patient's metoprolol from 100 mg daily to 50 mg daily and also cut down amlodipine to 5 mg daily if necessary.  Daughter reported that the patient lives at home with wife and with her caregiver on site help two times a day.  For some reason patient did his medication recently including blood pressure, A-fib and Xarelto for at least 1 month.  And daughter found out the fact only last week and patient was restarted on all his medication 3 days ago.    Assessment and Plan: Right sided hemothorax, traumatic -Chest tube placed by trauma surgeon in the ED, repeat chest x-ray showed resolution of right-sided pneumothorax and chest tube in the upper mid chest -Hold off PT evaluation until chest tube can be clamped or  discontinued.   Acute blood loss anemia -Secondary to rib fracture and right-sided hemothorax -trend hgb -Given patient has acute anemia symptoms both hypotension and bradycardia, will give patient 1 unit of PRBC.  -Last dosage of Xarelto yesterday evening.  Continue to hold off Xarelto and no chemical DVT prophylaxis today   Nondisplaced RIGHT anterior acetabular rim fracture.  -ortho consult  AKI -Secondary to hypotension and dehydration -CT abdomen pelvis pending to rule out renal obstructions -Start patient on maintenance IV fluid, repeat kidney function tomorrow. -Hold off BP meds including metoprolol, amlodipine, HCTZ and ACEI    Hyperkalemia -resolved   Lactic acidosis -Likely secondary to hypoperfusion from hypotension and bradycardia and acute blood loss anemia.  Patient has no cough no urinary symptoms no diarrhea to indicate active infection, monitor off antibiotics. -follow cultures   Acute metabolic encephalopathy -delirium precautions    Near syncope -Likely from commendation of hypotension and bradycardia. -On most recent cardiology evaluation in April 2023, cardiology cut down patient's metoprolol to 50 mg daily and plan to further cut down patient's amlodipine as patient continues to lose weight.  Cardiology given and consider readjust Xarelto in the future. However, I called patient's pharmacy [facility] and patient still taking same dosage of metoprolol XL 100 mg daily and amlodipine 10 mg daily. Both will be on hold and likely will need to cut down dosages on discharge. Daughter aware. -echo pending     Chronic A-fib -Hold metoprolol and Xarelto -As needed metoprolol   Chronic urinary retention -Has been doing self catheterizing  at home -foley placed by urology  Prostate CA -In Remission, continue home hormone therapy medication   Liver cyst, pancreatic cyst -Stable on CT scanning.   Moderate protein calorie malnutrition with unintentional  weight loss -about 15 lbs weight loss in last 6 months for poor appetite and food intake, for which patient has been on Ensure twice daily.  Pending progress may need palliative care consult    DVT prophylaxis: SCDs Start: 12/28/21 1442    Code Status: Full Code Family Communication: daughter at bedside  Disposition Plan:  Level of care: Telemetry Medical Status is: Inpatient Remains inpatient appropriate because: needs PT eval, has chest tube    Consultants:  trauma   Subjective: confused  Objective: Vitals:   12/28/21 2154 12/28/21 2223 12/29/21 0510 12/29/21 0811  BP: 109/64 (!) 106/53 110/62 (!) 126/52  Pulse: 79 75 79 60  Resp: 16 20 20 17   Temp:  98.1 F (36.7 C) 98.1 F (36.7 C) 97.8 F (36.6 C)  TempSrc:  Oral Axillary   SpO2: 99% 96% 97% 99%  Weight:      Height:        Intake/Output Summary (Last 24 hours) at 12/29/2021 1130 Last data filed at 12/29/2021 1053 Gross per 24 hour  Intake 1121.5 ml  Output 1600 ml  Net -478.5 ml   Filed Weights   12/28/21 1032  Weight: 68 kg    Examination:   General: Appearance:    elderly male who appear pale     Lungs:     Chest tube in place  Heart:    Normal heart rate.    MS:   All extremities are intact.    Neurologic:   Awake, pleasantly confused       Data Reviewed: I have personally reviewed following labs and imaging studies  CBC: Recent Labs  Lab 12/28/21 1043 12/28/21 1107 12/28/21 1529 12/28/21 2251 12/29/21 0222  WBC 12.2*  --   --   --  10.6*  HGB 8.2* 7.8* 8.6* 8.6* 8.4*  HCT 25.5* 23.0* 25.7* 24.9* 25.7*  MCV 100.8*  --   --   --  94.8  PLT 159  --   --   --  129*   Basic Metabolic Panel: Recent Labs  Lab 12/28/21 1043 12/28/21 1107 12/28/21 1529 12/29/21 0222  NA 137 136 140 138  K 5.8* 5.7* 5.0 3.7  CL 106 114* 104 109  CO2 19*  --  21* 21*  GLUCOSE 220* 210* 158* 136*  BUN 45* 42* 48* 53*  CREATININE 1.95* 1.90* 1.83* 1.70*  CALCIUM 8.5*  --  9.1 7.9*    GFR: Estimated Creatinine Clearance: 27.8 mL/min (A) (by C-G formula based on SCr of 1.7 mg/dL (H)). Liver Function Tests: Recent Labs  Lab 12/28/21 1043  AST 32  ALT 17  ALKPHOS 126  BILITOT 0.6  PROT 5.4*  ALBUMIN 2.5*   No results for input(s): "LIPASE", "AMYLASE" in the last 168 hours. No results for input(s): "AMMONIA" in the last 168 hours. Coagulation Profile: Recent Labs  Lab 12/28/21 1043  INR 1.9*   Cardiac Enzymes: No results for input(s): "CKTOTAL", "CKMB", "CKMBINDEX", "TROPONINI" in the last 168 hours. BNP (last 3 results) No results for input(s): "PROBNP" in the last 8760 hours. HbA1C: No results for input(s): "HGBA1C" in the last 72 hours. CBG: Recent Labs  Lab 12/29/21 0510  GLUCAP 144*   Lipid Profile: No results for input(s): "CHOL", "HDL", "LDLCALC", "TRIG", "CHOLHDL", "LDLDIRECT" in the last  72 hours. Thyroid Function Tests: No results for input(s): "TSH", "T4TOTAL", "FREET4", "T3FREE", "THYROIDAB" in the last 72 hours. Anemia Panel: No results for input(s): "VITAMINB12", "FOLATE", "FERRITIN", "TIBC", "IRON", "RETICCTPCT" in the last 72 hours. Sepsis Labs: Recent Labs  Lab 12/28/21 1043  LATICACIDVEN 4.9*    Recent Results (from the past 240 hour(s))  Culture, blood (routine x 2)     Status: None (Preliminary result)   Collection Time: 12/28/21 10:29 AM   Specimen: BLOOD RIGHT HAND  Result Value Ref Range Status   Specimen Description BLOOD RIGHT HAND  Final   Special Requests   Final    BOTTLES DRAWN AEROBIC AND ANAEROBIC Blood Culture results may not be optimal due to an inadequate volume of blood received in culture bottles   Culture   Final    NO GROWTH < 24 HOURS Performed at [facility], [address], [place], [place] [address]    Report Status PENDING  Incomplete  Culture, blood (routine x 2)     Status: None (Preliminary result)   Collection Time: 12/28/21 10:34 AM   Specimen: BLOOD RIGHT ARM  Result Value Ref  Range Status   Specimen Description BLOOD RIGHT ARM  Final   Special Requests   Final    BOTTLES DRAWN AEROBIC AND ANAEROBIC Blood Culture adequate volume   Culture   Final    NO GROWTH < 24 HOURS Performed at [facility], [address] [place], [place] [address]    Report Status PENDING  Incomplete         Radiology Studies: DG CHEST PORT 1 VIEW  Result Date: 12/29/2021 CLINICAL DATA:  Right-sided hemothorax EXAM: PORTABLE CHEST 1 VIEW COMPARISON:  Prior chest x-ray 12/28/2021 FINDINGS: Stable position of right-sided thoracostomy tube. No definite pneumothorax. Mild dependent atelectasis. The lungs are otherwise clear. Atherosclerotic calcification present in the transverse aorta. IMPRESSION: Stable position of right-sided chest tube. No evidence of pneumothorax. Mild bibasilar atelectasis. Electronically Signed   By: Malachy Moan M.D.   On: 12/29/2021 07:58   CT ABDOMEN PELVIS WO CONTRAST  Result Date: 12/28/2021 CLINICAL DATA:  Abdominal trauma, blunt EXAM: CT ABDOMEN AND PELVIS WITHOUT CONTRAST TECHNIQUE: Multidetector CT imaging of the abdomen and pelvis was performed following the standard protocol without IV contrast. RADIATION DOSE REDUCTION: This exam was performed according to the departmental dose-optimization program which includes automated exposure control, adjustment of the mA and/or kV according to patient size and/or use of iterative reconstruction technique. COMPARISON:  CT CAP, 01/19/2021 and 02/21/2020. FINDINGS: Lower chest: RIGHT basilar consolidation, likely atelectasis. Hepatobiliary: Multifocal hepatic hypodense lesions previously characterized as cysts. Largest hypodense lesion is within the RIGHT hepatic lobe, measures approximately 11.5 cm and with intermediate density. No gallstones, gallbladder wall thickening, or biliary dilatation. Pancreas: Atrophic with pancreatic head cystic lesion, best appreciated on comparison contrasted CT. No main pancreatic  ductal dilatation or surrounding inflammatory changes. Spleen: Normal in size without focal abnormality. Inferior pole accessory spleen. Adrenals/Urinary Tract: Adrenal glands are unremarkable. Senescent renal changes, with large LEFT midpolar exophytic hypodense lesion consistent with a simple renal cyst - no follow-up indicated. No renal calculi or hydronephrosis. Bladder is unremarkable. Stomach/Bowel: Stomach is within normal limits. Appendix appears normal. No evidence of bowel wall thickening, distention, or inflammatory changes. Vascular/Lymphatic: Aortic atherosclerosis without aneurysmal dilation. No enlarged abdominal or pelvic lymph nodes. Reproductive: Nonenlarged prostate. Other: No abdominal wall hernia or abnormality. No abdominopelvic ascites. Musculoskeletal: Gynecomastia. Diffuse osseous metastases. Nondisplaced RIGHT anterior acetabular rim  fracture. See key image. IMPRESSION: 1. Nondisplaced RIGHT anterior acetabular rim fracture. 2. Diffuse osseous metastasis. 3. 12 cm large intermediate density RIGHT hepatic lobe lesion, previously characterized as cyst on contrasted CT comparison. Additional incidental, chronic and senescent findings as above. Electronically Signed   By: Roanna Banning M.D.   On: 12/28/2021 15:40   DG Chest Portable 1 View  Result Date: 12/28/2021 CLINICAL DATA:  Chest tube placement EXAM: PORTABLE CHEST 1 VIEW COMPARISON:  Same day chest x-ray and CT FINDINGS: Interval placement of right-sided chest tube. Improved aeration of the right lung with decreased right hemothorax. No significant residual pleural fluid is evident. Improving right basilar atelectasis. Left lung is clear. No pneumothorax. Stable heart size. Aortic atherosclerosis. IMPRESSION: Interval placement of right-sided chest tube with improved aeration of the right lung and decreased right hemothorax. Electronically Signed   By: Duanne Guess D.O.   On: 12/28/2021 14:48   CT Chest Wo Contrast  Result  Date: 12/28/2021 CLINICAL DATA:  Respiratory illness EXAM: CT CHEST WITHOUT CONTRAST TECHNIQUE: Multidetector CT imaging of the chest was performed following the standard protocol without IV contrast. RADIATION DOSE REDUCTION: This exam was performed according to the departmental dose-optimization program which includes automated exposure control, adjustment of the mA and/or kV according to patient size and/or use of iterative reconstruction technique. COMPARISON:  CT chest, and pelvis dated January 19, 2021 FINDINGS: Cardiovascular: Mild cardiomegaly. No pericardial effusion. Normal caliber thoracic aorta with moderate calcified plaque. Mitral annular calcifications. Severe left main and three-vessel coronary artery calcifications. Mediastinum/Nodes: Esophagus and thyroid are unremarkable. No pathologically enlarged lymph nodes seen in the chest. Lungs/Pleura: Central airways are patent. Moderate right pleural effusion with associated hyperdensity. Linear ground-glass opacities of the right hemithorax, likely due to atelectasis. Upper Abdomen: Unchanged appearance of large complex cyst of the right hepatic dome, likely due to prior hemorrhage. Additional low-attenuation simple hepatic cysts, unchanged when compared to prior. Bilateral low-attenuation renal lesions which are likely simple cysts, no specific follow-up imaging is recommended. Unchanged left renal artery aneurysm measuring 1.5 cm. No acute abnormality. Musculoskeletal: Multiple sclerotic lesions involving the thoracic spine, ribs and sternum, unchanged when compared with prior exam. Mildly displaced posterolateral right 10th and 11th rib fractures. IMPRESSION: 1. Mildly displaced posterolateral right 10th and 11th rib fractures. 2. Moderate right hemothorax. 3. Stable sclerotic skeletal metastases, compatible with history of prostate cancer. 4. Aortic Atherosclerosis (ICD10-I70.0). Critical Value/emergent results were called by telephone at the time  of interpretation on 12/28/2021 at 12:58 pm to provider Alvino Blood , who verbally acknowledged these results. Electronically Signed   By: Allegra Lai M.D.   On: 12/28/2021 13:03   CT HEAD WO CONTRAST  Result Date: 12/28/2021 CLINICAL DATA:  Trauma. EXAM: CT HEAD WITHOUT CONTRAST CT CERVICAL SPINE WITHOUT CONTRAST TECHNIQUE: Multidetector CT imaging of the head and cervical spine was performed following the standard protocol without intravenous contrast. Multiplanar CT image reconstructions of the cervical spine were also generated. RADIATION DOSE REDUCTION: This exam was performed according to the departmental dose-optimization program which includes automated exposure control, adjustment of the mA and/or kV according to patient size and/or use of iterative reconstruction technique. COMPARISON:  12/21/21 CT head FINDINGS: CT HEAD FINDINGS Brain: No evidence of acute infarction, hemorrhage, hydrocephalus, extra-axial collection or mass lesion/mass effect. There is sequela of moderate chronic microvascular ischemic change with advanced generalized volume loss and parietal lobe predominance. Vascular: No hyperdense vessel or unexpected calcification. Skull: Normal. Negative for fracture or focal lesion. Sinuses/Orbits: Bilateral  lens replacement. Other: None. CT CERVICAL SPINE FINDINGS Alignment: There is grade 1 anterolisthesis of C4 on C5 and C7 on T1. There is mild retrolisthesis of C5 on C6. Skull base and vertebrae: No acute fracture. No primary bone lesion or focal pathologic process. Soft tissues and spinal canal: No prevertebral fluid or swelling. No visible canal hematoma. Disc levels: There is severe multilevel facet degenerative change with fusion of the facet joints at C2-C3 on the left and C3-C4 and C4-C5 on the right. There is also fusion of the C2-C4 vertebral bodies. Upper chest: Right-sided pleural effusion. Other: Negative IMPRESSION: 1. No acute intracranial abnormality. 2. No acute  fracture or traumatic malalignment of the cervical spine. 3. Advanced generalized volume loss and sequela of moderate chronic microvascular ischemic change. 4. Right-sided pleural effusion, which subjectively appears hyperdense. If there is concern for a rib fracture/thoracic injury, this could represent pleural blood products. Recommend further evaluation with a dedicated CT chest. Electronically Signed   By: Lorenza Cambridge M.D.   On: 12/28/2021 11:54   CT CERVICAL SPINE WO CONTRAST  Result Date: 12/28/2021 CLINICAL DATA:  Trauma. EXAM: CT HEAD WITHOUT CONTRAST CT CERVICAL SPINE WITHOUT CONTRAST TECHNIQUE: Multidetector CT imaging of the head and cervical spine was performed following the standard protocol without intravenous contrast. Multiplanar CT image reconstructions of the cervical spine were also generated. RADIATION DOSE REDUCTION: This exam was performed according to the departmental dose-optimization program which includes automated exposure control, adjustment of the mA and/or kV according to patient size and/or use of iterative reconstruction technique. COMPARISON:  12/21/21 CT head FINDINGS: CT HEAD FINDINGS Brain: No evidence of acute infarction, hemorrhage, hydrocephalus, extra-axial collection or mass lesion/mass effect. There is sequela of moderate chronic microvascular ischemic change with advanced generalized volume loss and parietal lobe predominance. Vascular: No hyperdense vessel or unexpected calcification. Skull: Normal. Negative for fracture or focal lesion. Sinuses/Orbits: Bilateral lens replacement. Other: None. CT CERVICAL SPINE FINDINGS Alignment: There is grade 1 anterolisthesis of C4 on C5 and C7 on T1. There is mild retrolisthesis of C5 on C6. Skull base and vertebrae: No acute fracture. No primary bone lesion or focal pathologic process. Soft tissues and spinal canal: No prevertebral fluid or swelling. No visible canal hematoma. Disc levels: There is severe multilevel facet  degenerative change with fusion of the facet joints at C2-C3 on the left and C3-C4 and C4-C5 on the right. There is also fusion of the C2-C4 vertebral bodies. Upper chest: Right-sided pleural effusion. Other: Negative IMPRESSION: 1. No acute intracranial abnormality. 2. No acute fracture or traumatic malalignment of the cervical spine. 3. Advanced generalized volume loss and sequela of moderate chronic microvascular ischemic change. 4. Right-sided pleural effusion, which subjectively appears hyperdense. If there is concern for a rib fracture/thoracic injury, this could represent pleural blood products. Recommend further evaluation with a dedicated CT chest. Electronically Signed   By: Lorenza Cambridge M.D.   On: 12/28/2021 11:54   DG Chest Port 1 View  Result Date: 12/28/2021 CLINICAL DATA:  Fall at home.  On blood thinners.  Hypotension. EXAM: PORTABLE CHEST 1 VIEW COMPARISON:  Chest CT 01/19/2021.  No recent radiographs. FINDINGS: 1034 hours. Two views submitted. The heart size and mediastinal contours are stable with aortic atherosclerosis and tortuosity. There is new asymmetric opacity in the right hemithorax suspicious for a layering pleural effusion. Mild associated right basilar atelectasis or infiltrate. No evidence of pneumothorax. The left lung appears clear. No acute fractures are identified. Known osseous metastatic disease from  prostate cancer is not well seen on these images. Telemetry leads overlie the chest. IMPRESSION: New asymmetric opacity in the right hemithorax suspicious for a layering pleural effusion with associated right basilar atelectasis or infiltrate. Follow-up PA and lateral views recommended when possible. No evidence of pneumothorax. Electronically Signed   By: Carey Bullocks M.D.   On: 12/28/2021 10:49        Scheduled Meds:  acetaminophen  650 mg Oral Q6H   Or   acetaminophen  650 mg Rectal Q6H   colesevelam  1,250 mg Oral BID   docusate sodium  100 mg Oral BID    enzalutamide  80 mg Oral BID   feeding supplement  237 mL Oral BID BM   lidocaine  1 patch Transdermal Q24H   rosuvastatin  40 mg Oral Daily   sodium chloride flush  3 mL Intravenous Q12H   Continuous Infusions:  sodium chloride 100 mL/hr at 12/29/21 1047     LOS: 1 day    Time spent: 45 minutes spent on chart review, discussion with nursing staff, consultants, updating family and interview/physical exam; more than 50% of that time was spent in counseling and/or coordination of care.    Joseph Art, DO [facility] Available via Epic secure chat 7am-7pm After these hours, please refer to coverage provider listed on [email or web address] 12/29/2021, 11:30 AM

## 2021-12-29 NOTE — Progress Notes (Signed)
PT Cancellation Note  Patient Details Name: Jacob Becker MRN: [id number] DOB: 05/17/1929   Cancelled Treatment:    Reason Eval/Treat Not Completed: Patient not medically ready Per MD Benjamine Mola, "Hold off PT evaluation until chest tube can be clamped or discontinued," and awaiting ortho consult.  Lillia Pauls, PT, DPT Acute Rehabilitation Services Office [phone]    Jacob Becker 12/29/2021, 12:03 PM

## 2021-12-29 NOTE — Consult Note (Signed)
Urology Consult   Physician requesting consult: Eulogio Bear, DO  Reason for consult: Difficult Foley catheter placement  History of Present Illness: Jacob Becker is a 88 y.o. male with a history of dementia who is currently being treated for a right-sided hemothorax.  He has no family at bedside, so the remainder of his history is obtained from the chart.  Apparently the patient performs clean remittent catheterization with a 10 French catheter multiple times per day for unknown reasons.  The nursing staff attempted to place multiple catheters after the patient was found to have a PVR of approximately 600 mL, but were unsuccessful.  Medical history unable to be obtained due to dementia  Current Hospital Medications:  Home Meds:  Current Meds  Medication Sig   acetaminophen (TYLENOL) 500 MG tablet Take 1,000 mg by mouth at bedtime as needed for moderate pain or mild pain.   amLODipine (NORVASC) 10 MG tablet Take 10 mg by mouth daily.   calcium citrate (CALCITRATE - DOSED IN MG ELEMENTAL CALCIUM) 950 (200 Ca) MG tablet Take 500 mg of elemental calcium by mouth 2 (two) times daily.   Coenzyme Q10 (COQ10) 100 MG CAPS Take 1 capsule by mouth daily.   colesevelam (WELCHOL) 625 MG tablet Take 1,250 mg by mouth 2 (two) times daily.   Ensure (ENSURE) Take 237 mLs by mouth daily as needed (as directed).   metoprolol succinate (TOPROL-XL) 100 MG 24 hr tablet See admin instructions. Sig from [place]:  Take one tablet (100 mg) by mouth in the morning and half tablet (50 mg) by mouth in the evening.   Omega-3 Fatty Acids (FISH OIL) 1000 MG CAPS Take 1 capsule by mouth in the morning and at bedtime.   ramipril (ALTACE) 10 MG capsule Take 10 mg by mouth 2 (two) times daily.   rosuvastatin (CRESTOR) 40 MG tablet Take 40 mg by mouth daily.   triamterene-hydrochlorothiazide (MAXZIDE-25) 37.5-25 MG tablet Take 2 tablets by mouth daily.   XARELTO 20 MG TABS tablet Take 20 mg by mouth daily.    XTANDI 40 MG tablet Take 80 mg by mouth 2 (two) times daily.    Scheduled Meds:  acetaminophen  650 mg Oral Q6H   Or   acetaminophen  650 mg Rectal Q6H   colesevelam  1,250 mg Oral BID   docusate sodium  100 mg Oral BID   enzalutamide  80 mg Oral BID   feeding supplement  237 mL Oral BID BM   lidocaine  1 patch Transdermal Q24H   rosuvastatin  40 mg Oral Daily   sodium chloride flush  3 mL Intravenous Q12H   Continuous Infusions:  sodium chloride 100 mL/hr at 12/29/21 1047   PRN Meds:.bisacodyl, fentaNYL (SUBLIMAZE) injection, haloperidol lactate, methocarbamol, metoprolol tartrate, polyethylene glycol, traMADol  Allergies: Not on File  History reviewed. No pertinent family history.  Social History:  has no history on file for tobacco use, alcohol use, and drug use.  ROS: Unable to be obtained  Physical Exam:  Vital signs in last 24 hours: Temp:  [97.1 F (36.2 C)-98.1 F (36.7 C)] 97.8 F (36.6 C) (12/02 0811) Pulse Rate:  [56-79] 60 (12/02 0811) Resp:  [16-26] 17 (12/02 0811) BP: (103-146)/(52-109) 126/52 (12/02 0811) SpO2:  [95 %-100 %] 99 % (12/02 0811) Constitutional: This oriented person place and time and requiring 4 place restraints GU: The penis is circumcised with no blood at the urethral meatus.  Both testicles are descended and demonstrate no masses  or nodularity  Laboratory Data:  Recent Labs    12/28/21 1043 12/28/21 1107 12/28/21 1529 12/28/21 2251 12/29/21 0222  WBC 12.2*  --   --   --  10.6*  HGB 8.2* 7.8* 8.6* 8.6* 8.4*  HCT 25.5* 23.0* 25.7* 24.9* 25.7*  PLT 159  --   --   --  129*    Recent Labs    12/28/21 1043 12/28/21 1107 12/28/21 1529 12/29/21 0222  NA 137 136 140 138  K 5.8* 5.7* 5.0 3.7  CL 106 114* 104 109  GLUCOSE 220* 210* 158* 136*  BUN 45* 42* 48* 53*  CALCIUM 8.5*  --  9.1 7.9*  CREATININE 1.95* 1.90* 1.83* 1.70*     Results for orders placed or performed during the hospital encounter of 12/28/21 (from the past  24 hour(s))  Urinalysis, Routine w reflex microscopic Urine, Catheterized     Status: Abnormal   Collection Time: 12/28/21  3:01 PM  Result Value Ref Range   Color, Urine AMBER (A) YELLOW   APPearance CLOUDY (A) CLEAR   Specific Gravity, Urine 1.012 1.005 - 1.030   pH 7.0 5.0 - 8.0   Glucose, UA NEGATIVE NEGATIVE mg/dL   Hgb urine dipstick SMALL (A) NEGATIVE   Bilirubin Urine NEGATIVE NEGATIVE   Ketones, ur NEGATIVE NEGATIVE mg/dL   Protein, ur 100 (A) NEGATIVE mg/dL   Nitrite NEGATIVE NEGATIVE   Leukocytes,Ua SMALL (A) NEGATIVE   RBC / HPF 21-50 0 - 5 RBC/hpf   WBC, UA 21-50 0 - 5 WBC/hpf   Bacteria, UA MANY (A) NONE SEEN   Squamous Epithelial / LPF 0-5 0 - 5   Mucus PRESENT    Hyaline Casts, UA PRESENT    Non Squamous Epithelial 0-5 (A) NONE SEEN  Hemoglobin and hematocrit, blood     Status: Abnormal   Collection Time: 12/28/21  3:29 PM  Result Value Ref Range   Hemoglobin 8.6 (L) 13.0 - 17.0 g/dL   HCT 25.7 (L) 39.0 - [id number] %  Basic metabolic panel     Status: Abnormal   Collection Time: 12/28/21  3:29 PM  Result Value Ref Range   Sodium 140 135 - 145 mmol/L   Potassium 5.0 3.5 - 5.1 mmol/L   Chloride 104 98 - 111 mmol/L   CO2 21 (L) 22 - 32 mmol/L   Glucose, Bld 158 (H) 70 - 99 mg/dL   BUN 48 (H) 8 - 23 mg/dL   Creatinine, Ser 1.83 (H) 0.61 - 1.24 mg/dL   Calcium 9.1 8.9 - 10.3 mg/dL   GFR, Estimated 35 (L) >60 mL/min   Anion gap 15 5 - 15  ABO/Rh     Status: None   Collection Time: 12/28/21  3:29 PM  Result Value Ref Range   ABO/RH(D)      AB POS Performed at [facility], [place] [address] [place], [place] [address]   Prepare RBC (crossmatch)     Status: None   Collection Time: 12/28/21  3:30 PM  Result Value Ref Range   Order Confirmation      ORDER PROCESSED BY BLOOD BANK Performed at [facility], [place] [address], [place], [place] [address]   Ethanol     Status: None   Collection Time: 12/28/21 10:51 PM  Result Value Ref Range   Alcohol, Ethyl  (B) <10 <10 mg/dL  Hemoglobin and hematocrit, blood     Status: Abnormal   Collection Time: 12/28/21 10:51 PM  Result  Value Ref Range   Hemoglobin 8.6 (L) 13.0 - 17.0 g/dL   HCT 24.9 (L) 39.0 - 52.0 %  CBC     Status: Abnormal   Collection Time: 12/29/21  2:22 AM  Result Value Ref Range   WBC 10.6 (H) 4.0 - 10.5 K/uL   RBC 2.71 (L) 4.22 - 5.81 MIL/uL   Hemoglobin 8.4 (L) 13.0 - 17.0 g/dL   HCT 25.7 (L) 39.0 - 52.0 %   MCV 94.8 80.0 - 100.0 fL   MCH 31.0 26.0 - 34.0 pg   MCHC 32.7 30.0 - 36.0 g/dL   RDW 15.6 (H) 11.5 - 15.5 %   Platelets 129 (L) 150 - 400 K/uL   nRBC 0.0 0.0 - 0.2 %  Basic metabolic panel     Status: Abnormal   Collection Time: 12/29/21  2:22 AM  Result Value Ref Range   Sodium 138 135 - 145 mmol/L   Potassium 3.7 3.5 - 5.1 mmol/L   Chloride 109 98 - 111 mmol/L   CO2 21 (L) 22 - 32 mmol/L   Glucose, Bld 136 (H) 70 - 99 mg/dL   BUN 53 (H) 8 - 23 mg/dL   Creatinine, Ser 1.70 (H) 0.61 - 1.24 mg/dL   Calcium 7.9 (L) 8.9 - 10.3 mg/dL   GFR, Estimated 38 (L) >60 mL/min   Anion gap 8 5 - 15  Glucose, capillary     Status: Abnormal   Collection Time: 12/29/21  5:10 AM  Result Value Ref Range   Glucose-Capillary 144 (H) 70 - 99 mg/dL   Recent Results (from the past 240 hour(s))  Culture, blood (routine x 2)     Status: None (Preliminary result)   Collection Time: 12/28/21 10:29 AM   Specimen: BLOOD RIGHT HAND  Result Value Ref Range Status   Specimen Description BLOOD RIGHT HAND  Final   Special Requests   Final    BOTTLES DRAWN AEROBIC AND ANAEROBIC Blood Culture results may not be optimal due to an inadequate volume of blood received in culture bottles   Culture   Final    NO GROWTH < 24 HOURS Performed at [facility], [address], [place], [place] [address]    Report Status PENDING  Incomplete  Culture, blood (routine x 2)     Status: None (Preliminary result)   Collection Time: 12/28/21 10:34 AM   Specimen: BLOOD RIGHT ARM  Result Value Ref  Range Status   Specimen Description BLOOD RIGHT ARM  Final   Special Requests   Final    BOTTLES DRAWN AEROBIC AND ANAEROBIC Blood Culture adequate volume   Culture   Final    NO GROWTH < 24 HOURS Performed at [facility], [place] [address], [place], [place] [address]    Report Status PENDING  Incomplete    Renal Function: Recent Labs    12/28/21 1043 12/28/21 1107 12/28/21 1529 12/29/21 0222  CREATININE 1.95* 1.90* 1.83* 1.70*   Estimated Creatinine Clearance: 27.8 mL/min (A) (by C-G formula based on SCr of 1.7 mg/dL (H)).  Radiologic Imaging: DG CHEST PORT 1 VIEW  Result Date: 12/29/2021 CLINICAL DATA:  Right-sided hemothorax EXAM: PORTABLE CHEST 1 VIEW COMPARISON:  Prior chest x-ray 12/28/2021 FINDINGS: Stable position of right-sided thoracostomy tube. No definite pneumothorax. Mild dependent atelectasis. The lungs are otherwise clear. Atherosclerotic calcification present in the transverse aorta. IMPRESSION: Stable position of right-sided chest tube. No evidence of pneumothorax. Mild bibasilar atelectasis. Electronically Signed   By: Myrle Sheng  Laurence Ferrari M.D.   On: 12/29/2021 07:58   CT ABDOMEN PELVIS WO CONTRAST  Result Date: 12/28/2021 CLINICAL DATA:  Abdominal trauma, blunt EXAM: CT ABDOMEN AND PELVIS WITHOUT CONTRAST TECHNIQUE: Multidetector CT imaging of the abdomen and pelvis was performed following the standard protocol without IV contrast. RADIATION DOSE REDUCTION: This exam was performed according to the departmental dose-optimization program which includes automated exposure control, adjustment of the mA and/or kV according to patient size and/or use of iterative reconstruction technique. COMPARISON:  CT CAP, 01/19/2021 and 02/21/2020. FINDINGS: Lower chest: RIGHT basilar consolidation, likely atelectasis. Hepatobiliary: Multifocal hepatic hypodense lesions previously characterized as cysts. Largest hypodense lesion is within the RIGHT hepatic lobe, measures approximately  11.5 cm and with intermediate density. No gallstones, gallbladder wall thickening, or biliary dilatation. Pancreas: Atrophic with pancreatic head cystic lesion, best appreciated on comparison contrasted CT. No main pancreatic ductal dilatation or surrounding inflammatory changes. Spleen: Normal in size without focal abnormality. Inferior pole accessory spleen. Adrenals/Urinary Tract: Adrenal glands are unremarkable. Senescent renal changes, with large LEFT midpolar exophytic hypodense lesion consistent with a simple renal cyst - no follow-up indicated. No renal calculi or hydronephrosis. Bladder is unremarkable. Stomach/Bowel: Stomach is within normal limits. Appendix appears normal. No evidence of bowel wall thickening, distention, or inflammatory changes. Vascular/Lymphatic: Aortic atherosclerosis without aneurysmal dilation. No enlarged abdominal or pelvic lymph nodes. Reproductive: Nonenlarged prostate. Other: No abdominal wall hernia or abnormality. No abdominopelvic ascites. Musculoskeletal: Gynecomastia. Diffuse osseous metastases. Nondisplaced RIGHT anterior acetabular rim fracture. See key image. IMPRESSION: 1. Nondisplaced RIGHT anterior acetabular rim fracture. 2. Diffuse osseous metastasis. 3. 12 cm large intermediate density RIGHT hepatic lobe lesion, previously characterized as cyst on contrasted CT comparison. Additional incidental, chronic and senescent findings as above. Electronically Signed   By: Michaelle Birks M.D.   On: 12/28/2021 15:40   DG Chest Portable 1 View  Result Date: 12/28/2021 CLINICAL DATA:  Chest tube placement EXAM: PORTABLE CHEST 1 VIEW COMPARISON:  Same day chest x-ray and CT FINDINGS: Interval placement of right-sided chest tube. Improved aeration of the right lung with decreased right hemothorax. No significant residual pleural fluid is evident. Improving right basilar atelectasis. Left lung is clear. No pneumothorax. Stable heart size. Aortic atherosclerosis. IMPRESSION:  Interval placement of right-sided chest tube with improved aeration of the right lung and decreased right hemothorax. Electronically Signed   By: Davina Poke D.O.   On: 12/28/2021 14:48   CT Chest Wo Contrast  Result Date: 12/28/2021 CLINICAL DATA:  Respiratory illness EXAM: CT CHEST WITHOUT CONTRAST TECHNIQUE: Multidetector CT imaging of the chest was performed following the standard protocol without IV contrast. RADIATION DOSE REDUCTION: This exam was performed according to the departmental dose-optimization program which includes automated exposure control, adjustment of the mA and/or kV according to patient size and/or use of iterative reconstruction technique. COMPARISON:  CT chest, and pelvis dated January 19, 2021 FINDINGS: Cardiovascular: Mild cardiomegaly. No pericardial effusion. Normal caliber thoracic aorta with moderate calcified plaque. Mitral annular calcifications. Severe left main and three-vessel coronary artery calcifications. Mediastinum/Nodes: Esophagus and thyroid are unremarkable. No pathologically enlarged lymph nodes seen in the chest. Lungs/Pleura: Central airways are patent. Moderate right pleural effusion with associated hyperdensity. Linear ground-glass opacities of the right hemithorax, likely due to atelectasis. Upper Abdomen: Unchanged appearance of large complex cyst of the right hepatic dome, likely due to prior hemorrhage. Additional low-attenuation simple hepatic cysts, unchanged when compared to prior. Bilateral low-attenuation renal lesions which are likely simple cysts, no specific follow-up imaging is recommended.  Unchanged left renal artery aneurysm measuring 1.5 cm. No acute abnormality. Musculoskeletal: Multiple sclerotic lesions involving the thoracic spine, ribs and sternum, unchanged when compared with prior exam. Mildly displaced posterolateral right 10th and 11th rib fractures. IMPRESSION: 1. Mildly displaced posterolateral right 10th and 11th rib fractures.  2. Moderate right hemothorax. 3. Stable sclerotic skeletal metastases, compatible with history of prostate cancer. 4. Aortic Atherosclerosis (ICD10-I70.0). Critical Value/emergent results were called by telephone at the time of interpretation on 12/28/2021 at 12:58 pm to provider Alvino Blood , who verbally acknowledged these results. Electronically Signed   By: Allegra Lai M.D.   On: 12/28/2021 13:03   CT HEAD WO CONTRAST  Result Date: 12/28/2021 CLINICAL DATA:  Trauma. EXAM: CT HEAD WITHOUT CONTRAST CT CERVICAL SPINE WITHOUT CONTRAST TECHNIQUE: Multidetector CT imaging of the head and cervical spine was performed following the standard protocol without intravenous contrast. Multiplanar CT image reconstructions of the cervical spine were also generated. RADIATION DOSE REDUCTION: This exam was performed according to the departmental dose-optimization program which includes automated exposure control, adjustment of the mA and/or kV according to patient size and/or use of iterative reconstruction technique. COMPARISON:  12/21/21 CT head FINDINGS: CT HEAD FINDINGS Brain: No evidence of acute infarction, hemorrhage, hydrocephalus, extra-axial collection or mass lesion/mass effect. There is sequela of moderate chronic microvascular ischemic change with advanced generalized volume loss and parietal lobe predominance. Vascular: No hyperdense vessel or unexpected calcification. Skull: Normal. Negative for fracture or focal lesion. Sinuses/Orbits: Bilateral lens replacement. Other: None. CT CERVICAL SPINE FINDINGS Alignment: There is grade 1 anterolisthesis of C4 on C5 and C7 on T1. There is mild retrolisthesis of C5 on C6. Skull base and vertebrae: No acute fracture. No primary bone lesion or focal pathologic process. Soft tissues and spinal canal: No prevertebral fluid or swelling. No visible canal hematoma. Disc levels: There is severe multilevel facet degenerative change with fusion of the facet joints at  C2-C3 on the left and C3-C4 and C4-C5 on the right. There is also fusion of the C2-C4 vertebral bodies. Upper chest: Right-sided pleural effusion. Other: Negative IMPRESSION: 1. No acute intracranial abnormality. 2. No acute fracture or traumatic malalignment of the cervical spine. 3. Advanced generalized volume loss and sequela of moderate chronic microvascular ischemic change. 4. Right-sided pleural effusion, which subjectively appears hyperdense. If there is concern for a rib fracture/thoracic injury, this could represent pleural blood products. Recommend further evaluation with a dedicated CT chest. Electronically Signed   By: Lorenza Cambridge M.D.   On: 12/28/2021 11:54   CT CERVICAL SPINE WO CONTRAST  Result Date: 12/28/2021 CLINICAL DATA:  Trauma. EXAM: CT HEAD WITHOUT CONTRAST CT CERVICAL SPINE WITHOUT CONTRAST TECHNIQUE: Multidetector CT imaging of the head and cervical spine was performed following the standard protocol without intravenous contrast. Multiplanar CT image reconstructions of the cervical spine were also generated. RADIATION DOSE REDUCTION: This exam was performed according to the departmental dose-optimization program which includes automated exposure control, adjustment of the mA and/or kV according to patient size and/or use of iterative reconstruction technique. COMPARISON:  12/21/21 CT head FINDINGS: CT HEAD FINDINGS Brain: No evidence of acute infarction, hemorrhage, hydrocephalus, extra-axial collection or mass lesion/mass effect. There is sequela of moderate chronic microvascular ischemic change with advanced generalized volume loss and parietal lobe predominance. Vascular: No hyperdense vessel or unexpected calcification. Skull: Normal. Negative for fracture or focal lesion. Sinuses/Orbits: Bilateral lens replacement. Other: None. CT CERVICAL SPINE FINDINGS Alignment: There is grade 1 anterolisthesis of C4 on C5 and C7  on T1. There is mild retrolisthesis of C5 on C6. Skull base and  vertebrae: No acute fracture. No primary bone lesion or focal pathologic process. Soft tissues and spinal canal: No prevertebral fluid or swelling. No visible canal hematoma. Disc levels: There is severe multilevel facet degenerative change with fusion of the facet joints at C2-C3 on the left and C3-C4 and C4-C5 on the right. There is also fusion of the C2-C4 vertebral bodies. Upper chest: Right-sided pleural effusion. Other: Negative IMPRESSION: 1. No acute intracranial abnormality. 2. No acute fracture or traumatic malalignment of the cervical spine. 3. Advanced generalized volume loss and sequela of moderate chronic microvascular ischemic change. 4. Right-sided pleural effusion, which subjectively appears hyperdense. If there is concern for a rib fracture/thoracic injury, this could represent pleural blood products. Recommend further evaluation with a dedicated CT chest. Electronically Signed   By: Marin Roberts M.D.   On: 12/28/2021 11:54   DG Chest Port 1 View  Result Date: 12/28/2021 CLINICAL DATA:  Fall at home.  On blood thinners.  Hypotension. EXAM: PORTABLE CHEST 1 VIEW COMPARISON:  Chest CT 01/19/2021.  No recent radiographs. FINDINGS: 1034 hours. Two views submitted. The heart size and mediastinal contours are stable with aortic atherosclerosis and tortuosity. There is new asymmetric opacity in the right hemithorax suspicious for a layering pleural effusion. Mild associated right basilar atelectasis or infiltrate. No evidence of pneumothorax. The left lung appears clear. No acute fractures are identified. Known osseous metastatic disease from prostate cancer is not well seen on these images. Telemetry leads overlie the chest. IMPRESSION: New asymmetric opacity in the right hemithorax suspicious for a layering pleural effusion with associated right basilar atelectasis or infiltrate. Follow-up PA and lateral views recommended when possible. No evidence of pneumothorax. Electronically Signed   By:  Richardean Sale M.D.   On: 12/28/2021 10:49    I independently reviewed the above imaging studies.  Preprocedure diagnosis: Urinary retention Post procedure diagnosis: Urinary retention, bulbar urethral stricture  Procedure: Cystoscopy with Foley catheter placement  Description of procedure: The patient genitalia were prepped and draped in usual sterile fashion.  A 16 French flexible cystoscope was inserted into the urethral meatus and advanced into the bulbar urethra where an area mild stricture was identified.  A Glidewire was then advanced through the aperture of the stricture and up into the bladder.  The flexible cystoscope was then carefully advanced over the wire and into the bladder.  A complete bladder survey revealed no intravesical pathology.  A 12 French catheter was then advanced over the Glidewire and into good position within the bladder with return of amber urine.  A urine specimen was sent for culture.  The catheter balloon was then inflated with 20 mLof sterile water and set to gravity drainage.  The patient tolerated the procedure well.  Impression/Recommendation 88 year old male with urinary retention and mild bulbar urethral stricture  -Keep Foley catheter in place.  We will arrange outpatient follow-up for catheter removal in the coming weeks -Urine culture pending.  Recommend culture specific antibiotic coverage if positive  Ellison Hughs, [facility] 12/29/2021, 1:43 PM

## 2021-12-29 NOTE — Progress Notes (Addendum)
Tried to put coude catheter, but halfway there is resistance, tried to twist the catheter but failed to insert it. Bladder scan shows 507 ml. NP Abigail notified.   Still awaiting for another coude catheter from central supply.   Jenne Campus RN tried to insert foley but failed due to resistance. NP Abigail notified.   Awaiting urologist to come and insert foley.

## 2021-12-29 NOTE — Progress Notes (Signed)
CROSS COVER NOTE  NAME: Jacob Becker MRN: [id number] DOB : 09-04-1929    Date of Service   12/29/2021   HPI/Events of Note   Patient was admitted for near syncope and fall.  He has a history of chronic urinary retention and self caths at home.  Due to altered mentation, patient is unable to self cath.  Orders were placed at ED for coude cath placement.  0200-notified by bedside RN that insertion of coude cath was not possible as resistance was met.  Bladder scan reading 570 cc.  0400-charge RN attempted to insert the cath, but also failed.  RN denies noting any bleeding or swelling.  Bladder scan reading 691 cc.  0430-urology [place] extender Janett Billow , NP) was paged.   0600-urology consult completed.  Per Lawerance Bach, Dr. Liliane Shi is aware of this patient and will see him this morning.  RN staff has been made aware to prepare urology cart for Dr. Liliane Shi.   Interventions/ Plan          Anthoney Harada, DNP, [facility] [place]

## 2021-12-29 NOTE — Progress Notes (Signed)
As per NP Abigail " Hello, just wanted to let you know I was able to get a hold of the urology team. Dr. Sande Brothers is currently at [facility] attending to another patient but will head over to you as soon as he is done. He would like the urology cart to be ready. He asked that the cystoscope, guidewire, and council tip catheters (sizes 16-20) be ready"

## 2021-12-29 NOTE — Progress Notes (Signed)
  Echocardiogram 2D Echocardiogram has been performed.  Delcie Roch 12/29/2021, 4:24 PM

## 2021-12-30 ENCOUNTER — Inpatient Hospital Stay (HOSPITAL_COMMUNITY): Payer: Medicare Other

## 2021-12-30 DIAGNOSIS — J942 Hemothorax: Secondary | ICD-10-CM | POA: Diagnosis not present

## 2021-12-30 LAB — CBC
HCT: 24.8 % — ABNORMAL LOW (ref 39.0–52.0)
Hemoglobin: 8.1 g/dL — ABNORMAL LOW (ref 13.0–17.0)
MCH: 31 pg (ref 26.0–34.0)
MCHC: 32.7 g/dL (ref 30.0–36.0)
MCV: 95 fL (ref 80.0–100.0)
Platelets: 134 10*3/uL — ABNORMAL LOW (ref 150–400)
RBC: 2.61 MIL/uL — ABNORMAL LOW (ref 4.22–5.81)
RDW: 15.4 % (ref 11.5–15.5)
WBC: 9.3 10*3/uL (ref 4.0–10.5)
nRBC: 0 % (ref 0.0–0.2)

## 2021-12-30 LAB — BASIC METABOLIC PANEL
Anion gap: 6 (ref 5–15)
Anion gap: 7 (ref 5–15)
BUN: 45 mg/dL — ABNORMAL HIGH (ref 8–23)
BUN: 40 mg/dL — ABNORMAL HIGH (ref 8–23)
CO2: 22 mmol/L (ref 22–32)
CO2: 20 mmol/L — ABNORMAL LOW (ref 22–32)
Calcium: 7.8 mg/dL — ABNORMAL LOW (ref 8.9–10.3)
Calcium: 8.1 mg/dL — ABNORMAL LOW (ref 8.9–10.3)
Chloride: 111 mmol/L (ref 98–111)
Chloride: 111 mmol/L (ref 98–111)
Creatinine, Ser: 1.36 mg/dL — ABNORMAL HIGH (ref 0.61–1.24)
Creatinine, Ser: 1.34 mg/dL — ABNORMAL HIGH (ref 0.61–1.24)
GFR, Estimated: 49 mL/min — ABNORMAL LOW (ref >60)
GFR, Estimated: 50 mL/min — ABNORMAL LOW (ref >60)
Glucose, Bld: 137 mg/dL — ABNORMAL HIGH (ref 70–99)
Glucose, Bld: 139 mg/dL — ABNORMAL HIGH (ref 70–99)
Potassium: 3.4 mmol/L — ABNORMAL LOW (ref 3.5–5.1)
Potassium: 3.6 mmol/L (ref 3.5–5.1)
Sodium: 139 mmol/L (ref 135–145)
Sodium: 138 mmol/L (ref 135–145)

## 2021-12-30 LAB — GLUCOSE, CAPILLARY: Glucose-Capillary: 122 mg/dL — ABNORMAL HIGH (ref 70–99)

## 2021-12-30 MED ORDER — POTASSIUM CHLORIDE CRYS ER 20 MEQ PO TBCR
40.0000 meq | EXTENDED_RELEASE_TABLET | Freq: Once | ORAL | Status: AC
  Administered 2021-12-30: 40 meq via ORAL
  Filled 2021-12-30: qty 2

## 2021-12-30 MED ORDER — METOPROLOL SUCCINATE ER 25 MG PO TB24
50.0000 mg | ORAL_TABLET | Freq: Every day | ORAL | Status: DC
  Administered 2021-12-30 – 2022-01-10 (×12): 50 mg via ORAL
  Filled 2021-12-30 (×12): qty 2

## 2021-12-30 MED ORDER — ACETAMINOPHEN 500 MG PO TABS
1000.0000 mg | ORAL_TABLET | Freq: Three times a day (TID) | ORAL | Status: DC
  Administered 2021-12-30 – 2022-01-10 (×31): 1000 mg via ORAL
  Filled 2021-12-30 (×32): qty 2

## 2021-12-30 MED ORDER — HEPARIN SODIUM (PORCINE) 5000 UNIT/ML IJ SOLN
5000.0000 [IU] | Freq: Three times a day (TID) | INTRAMUSCULAR | Status: DC
  Administered 2021-12-30 – 2022-01-10 (×34): 5000 [IU] via SUBCUTANEOUS
  Filled 2021-12-30 (×33): qty 1

## 2021-12-30 NOTE — Evaluation (Signed)
Physical Therapy Evaluation Patient Details Name: Jacob Becker MRN: [id number] DOB: [birth year] Today's Date: 12/30/2021  History of Present Illness  88 y.o. male presents to [facility] hospital on 12/28/2021 after near syncope and a fall. Pt found to have R hemothorax, R rib 10-11 fx, R nondisplaced anterior wall acetabular fx. PMH includes dementia, afib, HTN, CAD, prostate CA.  Clinical Impression  Pt presents to PT with mild deficits in bed mobility, but otherwise appears to not be far from his baseline. Pt is able to transfer and ambulate with UE support of a RW. PT suggests continued use of a RW to improve stability and to reduce the risk of future falls. Acute PT will follow up for stair training. Pt will benefit from HHPT upon discharge to aide in improving balance and to continue encouraging use of RW.      Recommendations for follow up therapy are one component of a multi-disciplinary discharge planning process, led by the attending physician.  Recommendations may be updated based on patient status, additional functional criteria and insurance authorization.  Follow Up Recommendations Home health PT      Assistance Recommended at Discharge Intermittent Supervision/Assistance  Patient can return home with the following  Help with stairs or ramp for entrance;A little help with walking and/or transfers;Assist for transportation;Direct supervision/assist for medications management;Direct supervision/assist for financial management;A little help with bathing/dressing/bathroom    Equipment Recommendations Rolling walker (2 wheels)  Recommendations for Other Services       Functional Status Assessment Patient has had a recent decline in their functional status and demonstrates the ability to make significant improvements in function in a reasonable and predictable amount of time.     Precautions / Restrictions Precautions Precautions: Fall Precaution Comments: chest  tube Restrictions Weight Bearing Restrictions: No      Mobility  Bed Mobility Overal bed mobility: Needs Assistance Bed Mobility: Supine to Sit     Supine to sit: Min assist, HOB elevated          Transfers Overall transfer level: Needs assistance Equipment used: Rolling walker (2 wheels) Transfers: Sit to/from Stand Sit to Stand: Min guard                Ambulation/Gait Ambulation/Gait assistance: Min guard Gait Distance (Feet): 200 Feet Assistive device: Rolling walker (2 wheels) Gait Pattern/deviations: Step-through pattern Gait velocity: functional Gait velocity interpretation: 1.31 - 2.62 ft/sec, indicative of limited community ambulator   General Gait Details: steady step-through Insurance risk surveyor    Modified Rankin (Stroke Patients Only)       Balance Overall balance assessment: Needs assistance Sitting-balance support: No upper extremity supported, Feet supported Sitting balance-Leahy Scale: Good     Standing balance support: Single extremity supported, Reliant on assistive device for balance Standing balance-Leahy Scale: Poor                               Pertinent Vitals/Pain Pain Assessment Pain Assessment: No/denies pain    Home Living Family/patient expects to be discharged to:: Private residence Living Arrangements: Spouse/significant other Available Help at Discharge: Personal care attendant (spouse has dementia, caregivers present from sun up until evening) Type of Home: House Home Access: Stairs to enter Entrance Stairs-Rails: None Entrance Stairs-Number of Steps: 4   Home Layout: One level Home Equipment: Other (comment) (walking stick)      Prior Function  Prior Level of Function : Needs assist             Mobility Comments: ambulatory with use of a walking stick ADLs Comments: caregivers perform IADLs, pt is independent with ADLs     Hand Dominance         Extremity/Trunk Assessment   Upper Extremity Assessment Upper Extremity Assessment: Overall WFL for tasks assessed    Lower Extremity Assessment Lower Extremity Assessment: Overall WFL for tasks assessed    Cervical / Trunk Assessment Cervical / Trunk Assessment: Kyphotic  Communication   Communication: No difficulties  Cognition Arousal/Alertness: Awake/alert Behavior During Therapy: WFL for tasks assessed/performed Overall Cognitive Status: History of cognitive impairments - at baseline                                 General Comments: dementia at baseline, tangential thought at times. poor memory        General Comments General comments (skin integrity, edema, etc.): VSS on RA    Exercises     Assessment/Plan    PT Assessment Patient needs continued PT services  PT Problem List Decreased activity tolerance;Decreased balance;Decreased mobility;Decreased cognition;Decreased knowledge of use of DME       PT Treatment Interventions DME instruction;Gait training;Stair training;Therapeutic activities;Functional mobility training;Balance training;Patient/family education;Cognitive remediation    PT Goals (Current goals can be found in the Care Plan section)  Acute Rehab PT Goals Patient Stated Goal: to return home PT Goal Formulation: With patient/family Time For Goal Achievement: 01/13/22 Potential to Achieve Goals: Good    Frequency Min 3X/week     Co-evaluation               AM-PAC PT "6 Clicks" Mobility  Outcome Measure Help needed turning from your back to your side while in a flat bed without using bedrails?: A Little Help needed moving from lying on your back to sitting on the side of a flat bed without using bedrails?: A Little Help needed moving to and from a bed to a chair (including a wheelchair)?: A Little Help needed standing up from a chair using your arms (e.g., wheelchair or bedside chair)?: A Little Help needed to walk in  hospital room?: A Little Help needed climbing 3-5 steps with a railing? : A Little 6 Click Score: 18    End of Session   Activity Tolerance: Patient tolerated treatment well Patient left: in chair;with call bell/phone within reach;with chair alarm set Nurse Communication: Mobility status PT Visit Diagnosis: Other abnormalities of gait and mobility (R26.89)    Time: 1610-9604 PT Time Calculation (min) (ACUTE ONLY): 42 min   Charges:   PT Evaluation $PT Eval Low Complexity: 1 Low          Arlyss Gandy, PT, DPT Acute Rehabilitation Office [phone]   Arlyss Gandy 12/30/2021, 10:19 AM

## 2021-12-30 NOTE — Progress Notes (Addendum)
[facility] Progress Note     Subjective: CC-  Much more calm this morning. Still a little confused but improved. He reports some mild pain from rib fractures when he takes a deep breath. Pulling 1250 on IS.   Objective: Vital signs in last 24 hours: Temp:  [98.7 F (37.1 C)] 98.7 F (37.1 C) (12/02 2015) Pulse Rate:  [73-80] 73 (12/03 0502) Resp:  [18] 18 (12/03 0502) BP: (126-169)/(64-87) 169/87 (12/03 0502) SpO2:  [97 %-98 %] 98 % (12/03 0502) Weight:  [68.2 kg] 68.2 kg (12/03 0500) Last BM Date :  (unknown)  Intake/Output from previous day: 12/02 0701 - 12/03 0700 In: -  Out: 900 [Urine:900] Intake/Output this shift: No intake/output data recorded.  PE: Gen:  Alert, NAD Card:  normal heart rate Pulm:  CTAB, no W/R/R, rate and effort normal on room air. Right chest tube without air leak. Pulling 1250 on IS Abd: Soft, NT/ND Ext:  no BUE/BLE edema, calves soft and nontender. 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 [facility], knows he has some rib fractures, states the year is 2008 Skin: no rashes noted, warm and dry  Lab Results:  Recent Labs    12/29/21 0222 12/30/21 0149  WBC 10.6* 9.3  HGB 8.4* 8.1*  HCT 25.7* 24.8*  PLT 129* 134*   BMET Recent Labs    12/29/21 0222 12/30/21 0149  NA 138 139  K 3.7 3.4*  CL 109 111  CO2 21* 22  GLUCOSE 136* 137*  BUN 53* 45*  CREATININE 1.70* 1.36*  CALCIUM 7.9* 7.8*   PT/INR Recent Labs    12/28/21 1043  LABPROT 21.5*  INR 1.9*   CMP     Component Value Date/Time   NA 139 12/30/2021 0149   K 3.4 (L) 12/30/2021 0149   CL 111 12/30/2021 0149   CO2 22 12/30/2021 0149   GLUCOSE 137 (H) 12/30/2021 0149   BUN 45 (H) 12/30/2021 0149   CREATININE 1.36 (H) 12/30/2021 0149   CALCIUM 7.8 (L) 12/30/2021 0149   PROT 5.4 (L) 12/28/2021  1043   ALBUMIN 2.5 (L) 12/28/2021 1043   AST 32 12/28/2021 1043   ALT 17 12/28/2021 1043   ALKPHOS 126 12/28/2021 1043   BILITOT 0.6 12/28/2021 1043   GFRNONAA 49 (L) 12/30/2021 0149   Lipase  No results found for: "LIPASE"     Studies/Results: ECHOCARDIOGRAM COMPLETE  Result Date: 12/29/2021    ECHOCARDIOGRAM REPORT   Patient Name:   Jacob Becker Date of Exam: 12/29/2021 Medical Rec #:  [id number]        Height:       70.0 in Accession #:    [id number]       Weight:       150.0 lb Date of Birth:  12-09-1929        BSA:          1.847 m Patient Age:    88 years         BP:           126/52 mmHg Patient Gender: M                HR:           71 bpm. Exam Location:  Inpatient Procedure: 2D Echo Indications:    syncope  History:        Patient has no prior history of Echocardiogram examinations.                 Arrythmias:Atrial Fibrillation.  Sonographer:    Delcie Roch RDCS Referring Phys: [id number] [facility]  Sonographer Comments: Image acquisition challenging due to respiratory motion. IMPRESSIONS  1. Left ventricular ejection fraction, by estimation, is 60 to 65%. The left ventricle has normal function. The left ventricle has no regional wall motion abnormalities. There is moderate left ventricular hypertrophy of the basal-septal segment. Left ventricular diastolic parameters are indeterminate.  2. Right ventricular systolic function is normal. The right ventricular size is normal. There is normal pulmonary artery systolic pressure.  3. Left atrial size was moderately dilated.  4. Right atrial size was moderately dilated.  5. The mitral valve is normal in structure. No evidence of mitral valve regurgitation. No evidence of mitral stenosis.  6. The aortic valve is calcified. There is moderate calcification of the aortic valve. Aortic valve regurgitation is moderate. Aortic valve sclerosis is present, with no evidence of aortic valve stenosis. Aortic valve mean gradient measures 8.2 mmHg.  Aortic valve Vmax measures 1.98 m/s.  7. The inferior vena cava is normal in size with greater than 50% respiratory variability, suggesting right atrial pressure of 3 mmHg. FINDINGS  Left Ventricle: Left ventricular ejection fraction, by estimation, is 60 to 65%. The left ventricle has normal function. The left ventricle has no regional wall motion abnormalities. The left ventricular internal cavity size was normal in size. There is  moderate left ventricular hypertrophy of the basal-septal segment. Left ventricular diastolic parameters are indeterminate. Right Ventricle: The right ventricular size is normal. No increase in right ventricular wall thickness. Right ventricular systolic function is normal. There is normal pulmonary artery systolic pressure. The tricuspid regurgitant velocity is 2.66 m/s, and  with an assumed right atrial pressure of 3 mmHg, the estimated right ventricular systolic pressure is 31.3 mmHg. Left Atrium: Left atrial size was moderately dilated. Right Atrium: Right atrial size was moderately dilated. Pericardium: There is no evidence of pericardial effusion. Mitral Valve: The mitral valve is normal in structure. No evidence of mitral valve regurgitation. No evidence of mitral valve stenosis. Tricuspid Valve: The tricuspid valve is normal in structure. Tricuspid valve regurgitation is mild . No evidence of tricuspid stenosis. Aortic Valve: The aortic valve is calcified. There is moderate calcification of the aortic valve. Aortic valve regurgitation is moderate. Aortic valve sclerosis is present, with no evidence of aortic valve stenosis. Aortic valve mean gradient measures 8.2 mmHg. Aortic valve peak gradient measures 15.6 mmHg. Aortic valve area, by VTI measures 2.55 cm. Pulmonic Valve: The pulmonic valve was normal in structure. Pulmonic valve regurgitation is mild. No evidence of pulmonic stenosis. Aorta: The aortic root is normal in size and structure. Venous: The inferior vena cava is  normal in size with greater than 50% respiratory variability, suggesting right atrial pressure of 3 mmHg. IAS/Shunts: No atrial level shunt detected by color flow Doppler.  LEFT VENTRICLE PLAX 2D LVIDd:         4.60 cm LVIDs:         2.70 cm LV PW:         1.10 cm LV IVS:        1.40 cm LVOT diam:     2.50 cm LV SV:         85 LV SV Index:   46 LVOT Area:  4.91 cm  RIGHT VENTRICLE RV S prime:     16.10 cm/s LEFT ATRIUM             Index        RIGHT ATRIUM           Index LA diam:        5.30 cm 2.87 cm/m   RA Area:     22.90 cm LA Vol (A2C):   75.0 ml 40.60 ml/m  RA Volume:   69.80 ml  37.79 ml/m LA Vol (A4C):   81.7 ml 44.23 ml/m LA Biplane Vol: 80.9 ml 43.80 ml/m  AORTIC VALVE AV Area (Vmax):    2.62 cm AV Area (Vmean):   2.51 cm AV Area (VTI):     2.55 cm AV Vmax:           197.50 cm/s AV Vmean:          131.750 cm/s AV VTI:            0.333 m AV Peak Grad:      15.6 mmHg AV Mean Grad:      8.2 mmHg LVOT Vmax:         105.47 cm/s LVOT Vmean:        67.400 cm/s LVOT VTI:          0.173 m LVOT/AV VTI ratio: 0.52  AORTA Ao Root diam: 3.60 cm Ao Asc diam:  3.50 cm TRICUSPID VALVE TR Peak grad:   28.3 mmHg TR Vmax:        266.00 cm/s  SHUNTS Systemic VTI:  0.17 m Systemic Diam: 2.50 cm Donato Schultz MD Electronically signed by Donato Schultz MD Signature Date/Time: 12/29/2021/5:28:41 PM    Final    DG CHEST PORT 1 VIEW  Result Date: 12/29/2021 CLINICAL DATA:  Right-sided hemothorax EXAM: PORTABLE CHEST 1 VIEW COMPARISON:  Prior chest x-ray 12/28/2021 FINDINGS: Stable position of right-sided thoracostomy tube. No definite pneumothorax. Mild dependent atelectasis. The lungs are otherwise clear. Atherosclerotic calcification present in the transverse aorta. IMPRESSION: Stable position of right-sided chest tube. No evidence of pneumothorax. Mild bibasilar atelectasis. Electronically Signed   By: Malachy Moan M.D.   On: 12/29/2021 07:58   CT ABDOMEN PELVIS WO CONTRAST  Result Date: 12/28/2021 CLINICAL  DATA:  Abdominal trauma, blunt EXAM: CT ABDOMEN AND PELVIS WITHOUT CONTRAST TECHNIQUE: Multidetector CT imaging of the abdomen and pelvis was performed following the standard protocol without IV contrast. RADIATION DOSE REDUCTION: This exam was performed according to the departmental dose-optimization program which includes automated exposure control, adjustment of the mA and/or kV according to patient size and/or use of iterative reconstruction technique. COMPARISON:  CT CAP, 01/19/2021 and 02/21/2020. FINDINGS: Lower chest: RIGHT basilar consolidation, likely atelectasis. Hepatobiliary: Multifocal hepatic hypodense lesions previously characterized as cysts. Largest hypodense lesion is within the RIGHT hepatic lobe, measures approximately 11.5 cm and with intermediate density. No gallstones, gallbladder wall thickening, or biliary dilatation. Pancreas: Atrophic with pancreatic head cystic lesion, best appreciated on comparison contrasted CT. No main pancreatic ductal dilatation or surrounding inflammatory changes. Spleen: Normal in size without focal abnormality. Inferior pole accessory spleen. Adrenals/Urinary Tract: Adrenal glands are unremarkable. Senescent renal changes, with large LEFT midpolar exophytic hypodense lesion consistent with a simple renal cyst - no follow-up indicated. No renal calculi or hydronephrosis. Bladder is unremarkable. Stomach/Bowel: Stomach is within normal limits. Appendix appears normal. No evidence of bowel wall thickening, distention, or inflammatory changes. Vascular/Lymphatic: Aortic atherosclerosis without aneurysmal dilation. No enlarged abdominal or  pelvic lymph nodes. Reproductive: Nonenlarged prostate. Other: No abdominal wall hernia or abnormality. No abdominopelvic ascites. Musculoskeletal: Gynecomastia. Diffuse osseous metastases. Nondisplaced RIGHT anterior acetabular rim fracture. See key image. IMPRESSION: 1. Nondisplaced RIGHT anterior acetabular rim fracture. 2.  Diffuse osseous metastasis. 3. 12 cm large intermediate density RIGHT hepatic lobe lesion, previously characterized as cyst on contrasted CT comparison. Additional incidental, chronic and senescent findings as above. Electronically Signed   By: Roanna Banning M.D.   On: 12/28/2021 15:40   DG Chest Portable 1 View  Result Date: 12/28/2021 CLINICAL DATA:  Chest tube placement EXAM: PORTABLE CHEST 1 VIEW COMPARISON:  Same day chest x-ray and CT FINDINGS: Interval placement of right-sided chest tube. Improved aeration of the right lung with decreased right hemothorax. No significant residual pleural fluid is evident. Improving right basilar atelectasis. Left lung is clear. No pneumothorax. Stable heart size. Aortic atherosclerosis. IMPRESSION: Interval placement of right-sided chest tube with improved aeration of the right lung and decreased right hemothorax. Electronically Signed   By: Duanne Guess D.O.   On: 12/28/2021 14:48   CT Chest Wo Contrast  Result Date: 12/28/2021 CLINICAL DATA:  Respiratory illness EXAM: CT CHEST WITHOUT CONTRAST TECHNIQUE: Multidetector CT imaging of the chest was performed following the standard protocol without IV contrast. RADIATION DOSE REDUCTION: This exam was performed according to the departmental dose-optimization program which includes automated exposure control, adjustment of the mA and/or kV according to patient size and/or use of iterative reconstruction technique. COMPARISON:  CT chest, and pelvis dated January 19, 2021 FINDINGS: Cardiovascular: Mild cardiomegaly. No pericardial effusion. Normal caliber thoracic aorta with moderate calcified plaque. Mitral annular calcifications. Severe left main and three-vessel coronary artery calcifications. Mediastinum/Nodes: Esophagus and thyroid are unremarkable. No pathologically enlarged lymph nodes seen in the chest. Lungs/Pleura: Central airways are patent. Moderate right pleural effusion with associated hyperdensity. Linear  ground-glass opacities of the right hemithorax, likely due to atelectasis. Upper Abdomen: Unchanged appearance of large complex cyst of the right hepatic dome, likely due to prior hemorrhage. Additional low-attenuation simple hepatic cysts, unchanged when compared to prior. Bilateral low-attenuation renal lesions which are likely simple cysts, no specific follow-up imaging is recommended. Unchanged left renal artery aneurysm measuring 1.5 cm. No acute abnormality. Musculoskeletal: Multiple sclerotic lesions involving the thoracic spine, ribs and sternum, unchanged when compared with prior exam. Mildly displaced posterolateral right 10th and 11th rib fractures. IMPRESSION: 1. Mildly displaced posterolateral right 10th and 11th rib fractures. 2. Moderate right hemothorax. 3. Stable sclerotic skeletal metastases, compatible with history of prostate cancer. 4. Aortic Atherosclerosis (ICD10-I70.0). Critical Value/emergent results were called by telephone at the time of interpretation on 12/28/2021 at 12:58 pm to provider Alvino Blood , who verbally acknowledged these results. Electronically Signed   By: Allegra Lai M.D.   On: 12/28/2021 13:03   CT HEAD WO CONTRAST  Result Date: 12/28/2021 CLINICAL DATA:  Trauma. EXAM: CT HEAD WITHOUT CONTRAST CT CERVICAL SPINE WITHOUT CONTRAST TECHNIQUE: Multidetector CT imaging of the head and cervical spine was performed following the standard protocol without intravenous contrast. Multiplanar CT image reconstructions of the cervical spine were also generated. RADIATION DOSE REDUCTION: This exam was performed according to the departmental dose-optimization program which includes automated exposure control, adjustment of the mA and/or kV according to patient size and/or use of iterative reconstruction technique. COMPARISON:  12/21/21 CT head FINDINGS: CT HEAD FINDINGS Brain: No evidence of acute infarction, hemorrhage, hydrocephalus, extra-axial collection or mass  lesion/mass effect. There is sequela of moderate chronic microvascular ischemic  change with advanced generalized volume loss and parietal lobe predominance. Vascular: No hyperdense vessel or unexpected calcification. Skull: Normal. Negative for fracture or focal lesion. Sinuses/Orbits: Bilateral lens replacement. Other: None. CT CERVICAL SPINE FINDINGS Alignment: There is grade 1 anterolisthesis of C4 on C5 and C7 on T1. There is mild retrolisthesis of C5 on C6. Skull base and vertebrae: No acute fracture. No primary bone lesion or focal pathologic process. Soft tissues and spinal canal: No prevertebral fluid or swelling. No visible canal hematoma. Disc levels: There is severe multilevel facet degenerative change with fusion of the facet joints at C2-C3 on the left and C3-C4 and C4-C5 on the right. There is also fusion of the C2-C4 vertebral bodies. Upper chest: Right-sided pleural effusion. Other: Negative IMPRESSION: 1. No acute intracranial abnormality. 2. No acute fracture or traumatic malalignment of the cervical spine. 3. Advanced generalized volume loss and sequela of moderate chronic microvascular ischemic change. 4. Right-sided pleural effusion, which subjectively appears hyperdense. If there is concern for a rib fracture/thoracic injury, this could represent pleural blood products. Recommend further evaluation with a dedicated CT chest. Electronically Signed   By: Lorenza Cambridge M.D.   On: 12/28/2021 11:54   CT CERVICAL SPINE WO CONTRAST  Result Date: 12/28/2021 CLINICAL DATA:  Trauma. EXAM: CT HEAD WITHOUT CONTRAST CT CERVICAL SPINE WITHOUT CONTRAST TECHNIQUE: Multidetector CT imaging of the head and cervical spine was performed following the standard protocol without intravenous contrast. Multiplanar CT image reconstructions of the cervical spine were also generated. RADIATION DOSE REDUCTION: This exam was performed according to the departmental dose-optimization program which includes automated  exposure control, adjustment of the mA and/or kV according to patient size and/or use of iterative reconstruction technique. COMPARISON:  12/21/21 CT head FINDINGS: CT HEAD FINDINGS Brain: No evidence of acute infarction, hemorrhage, hydrocephalus, extra-axial collection or mass lesion/mass effect. There is sequela of moderate chronic microvascular ischemic change with advanced generalized volume loss and parietal lobe predominance. Vascular: No hyperdense vessel or unexpected calcification. Skull: Normal. Negative for fracture or focal lesion. Sinuses/Orbits: Bilateral lens replacement. Other: None. CT CERVICAL SPINE FINDINGS Alignment: There is grade 1 anterolisthesis of C4 on C5 and C7 on T1. There is mild retrolisthesis of C5 on C6. Skull base and vertebrae: No acute fracture. No primary bone lesion or focal pathologic process. Soft tissues and spinal canal: No prevertebral fluid or swelling. No visible canal hematoma. Disc levels: There is severe multilevel facet degenerative change with fusion of the facet joints at C2-C3 on the left and C3-C4 and C4-C5 on the right. There is also fusion of the C2-C4 vertebral bodies. Upper chest: Right-sided pleural effusion. Other: Negative IMPRESSION: 1. No acute intracranial abnormality. 2. No acute fracture or traumatic malalignment of the cervical spine. 3. Advanced generalized volume loss and sequela of moderate chronic microvascular ischemic change. 4. Right-sided pleural effusion, which subjectively appears hyperdense. If there is concern for a rib fracture/thoracic injury, this could represent pleural blood products. Recommend further evaluation with a dedicated CT chest. Electronically Signed   By: Lorenza Cambridge M.D.   On: 12/28/2021 11:54   DG Chest Port 1 View  Result Date: 12/28/2021 CLINICAL DATA:  Fall at home.  On blood thinners.  Hypotension. EXAM: PORTABLE CHEST 1 VIEW COMPARISON:  Chest CT 01/19/2021.  No recent radiographs. FINDINGS: 1034 hours. Two  views submitted. The heart size and mediastinal contours are stable with aortic atherosclerosis and tortuosity. There is new asymmetric opacity in the right hemithorax suspicious for a layering pleural  effusion. Mild associated right basilar atelectasis or infiltrate. No evidence of pneumothorax. The left lung appears clear. No acute fractures are identified. Known osseous metastatic disease from prostate cancer is not well seen on these images. Telemetry leads overlie the chest. IMPRESSION: New asymmetric opacity in the right hemithorax suspicious for a layering pleural effusion with associated right basilar atelectasis or infiltrate. Follow-up PA and lateral views recommended when possible. No evidence of pneumothorax. Electronically Signed   By: Carey Bullocks M.D.   On: 12/28/2021 10:49    Anti-infectives: Anti-infectives (From admission, onward)    Start     Dose/Rate Route Frequency Ordered Stop   12/28/21 1215  cefTRIAXone (ROCEPHIN) 1 g in sodium chloride 0.9 % 100 mL IVPB  Status:  Discontinued        1 g 200 mL/hr over 30 Minutes Intravenous  Once 12/28/21 1205 12/28/21 1320   12/28/21 1215  azithromycin (ZITHROMAX) 500 mg in sodium chloride 0.9 % 250 mL IVPB  Status:  Discontinued        500 mg 250 mL/hr over 60 Minutes Intravenous  Once 12/28/21 1205 12/28/21 1320        Assessment/Plan Fall on xarelto R hemothorax - s/p chest tube placement 12/2. Apical PNX on CXR this morning. Continue chest tube to -20 suction today and repeat CXR in AM. Encourage IS R rib fractures 10-11 - multimodal pain control and pulm toilet ABL anemia - s/p 1u PRBCs 12/1. Hgb 8.1 from 8.4, stable Right anterior acetabular rib fx - per ortho, Dr. Sherilyn Dacosta, nonop, WBAT RLE Chronic A fib on xarelto - would consider holding anticoagulation indefinitely  Chronic urinary retention - I&O caths at home. Foley placed by urology 12/2 Prostate cancer HTN HLD   ID - none currently FEN - IVF per [facility], HH/CM  diet VTE - SCDs, ok for chemical dvt ppx from surgical standpoint Foley - placed by urology 12/2   Dispo - Chest tube as above. PT/OT. Discussed with primary team.   I reviewed hospitalist notes, last 24 h vitals and pain scores, last 48 h intake and output, last 24 h labs and trends, and last 24 h imaging results.    LOS: 2 days    Franne Forts, [facility] 12/30/2021, 9:04 AM Please see Amion for pager number during day hours 7:00am-4:30pm

## 2021-12-30 NOTE — Plan of Care (Signed)
  Problem: Education: Goal: Knowledge of condition and prescribed therapy will improve Outcome: Progressing   Problem: Cardiac: Goal: Will achieve and/or maintain adequate cardiac output Outcome: Progressing   Problem: Physical Regulation: Goal: Complications related to the disease process, condition or treatment will be avoided or minimized Outcome: Progressing   Problem: Safety: Goal: Non-violent Restraint(s) Outcome: Progressing   Problem: Education: Goal: Knowledge of General Education information will improve Description: Including pain rating scale, medication(s)/side effects and non-pharmacologic comfort measures Outcome: Progressing   Problem: Health Behavior/Discharge Planning: Goal: Ability to manage health-related needs will improve Outcome: Progressing   Problem: Clinical Measurements: Goal: Ability to maintain clinical measurements within normal limits will improve Outcome: Progressing Goal: Will remain free from infection Outcome: Progressing Goal: Diagnostic test results will improve Outcome: Progressing Goal: Respiratory complications will improve Outcome: Progressing Goal: Cardiovascular complication will be avoided Outcome: Progressing   Problem: Activity: Goal: Risk for activity intolerance will decrease Outcome: Progressing   Problem: Nutrition: Goal: Adequate nutrition will be maintained Outcome: Progressing   Problem: Coping: Goal: Level of anxiety will decrease Outcome: Progressing   Problem: Elimination: Goal: Will not experience complications related to bowel motility Outcome: Progressing Goal: Will not experience complications related to urinary retention Outcome: Progressing   Problem: Pain Managment: Goal: General experience of comfort will improve Outcome: Progressing   Problem: Safety: Goal: Ability to remain free from injury will improve Outcome: Progressing   Problem: Skin Integrity: Goal: Risk for impaired skin integrity  will decrease Outcome: Progressing

## 2021-12-30 NOTE — Progress Notes (Signed)
PROGRESS NOTE    Jacob Becker  [id number] DOB: 27-Oct-1929 DOA: 12/28/2021 PCP: Kathalene Frames, MD    Brief Narrative:  Jacob Becker is a 88 y.o. male with medical history significant of advanced dementia, chronic A-fib on Xarelto, HTN, CAD, prostate cancer in remission on chronic hormone manipulation, hepatic and pancreatic cysts, presented with near syncope and fall.   Patient has advanced dementia and only thing he could remember was he started to feel lightheadedness since yesterday evening, and this morning, by accident, he locked himself inside the bedroom, and when family knocked the door from outside, patient did not realize and went to open the door, but lost his balance and fell on the right side of the chest.  No head injury, no LOC.  Daughter noticed the patient looked pale and clammy, and had slurred speech and confusion.  EMS arrived and found patient blood pressure in the lower 80s and bradycardia in the 50s.   According to patient's cardiologist note, patient has hypertension and A-fib but on the recent visit in April 2023, it was noticed the patient develop borderline bradycardia and borderline hypotension and cardiology recommended to cut down patient's metoprolol from 100 mg daily to 50 mg daily and also cut down amlodipine to 5 mg daily if necessary.  Daughter reported that the patient lives at home with wife and with her caregiver on site help two times a day.  For some reason patient did his medication recently including blood pressure, A-fib and Xarelto for at least 1 month.  And daughter found out the fact only last week and patient was restarted on all his medication 3 days ago.    Assessment and Plan: Right sided hemothorax, traumatic -Chest tube placed by trauma surgeon in the ED, repeat chest x-ray showed resolution of right-sided pneumothorax and chest tube in the upper mid chest -PT eval-- home health   Acute blood loss anemia -Secondary to rib  fracture and right-sided hemothorax -trend hgb -Given patient has acute anemia symptoms both hypotension and bradycardia, will give patient 1 unit of PRBC.  -Last dosage of Xarelto yesterday evening.  Continue to hold off Xarelto     Nondisplaced RIGHT anterior acetabular rim fracture.  -ortho consult- WBAT, no surgery needed  AKI -Secondary to hypotension and dehydration -CT abdomen pelvis pending to rule out renal obstructions -CR trending down -Hold off BP meds including metoprolol, amlodipine, HCTZ and ACEI    Hyperkalemia -resolved   Lactic acidosis -Likely secondary to hypoperfusion from hypotension and bradycardia and acute blood loss anemia.  Patient has no cough no urinary symptoms no diarrhea to indicate active infection, monitor off antibiotics. -follow cultures   Acute metabolic encephalopathy -delirium precautions    Near syncope -Likely from commendation of hypotension and bradycardia. -On most recent cardiology evaluation in April 2023, cardiology cut down patient's metoprolol to 50 mg daily and plan to further cut down patient's amlodipine as patient continues to lose weight.  Cardiology given and consider readjust Xarelto in the future. However, I called patient's pharmacy [facility] and patient still taking same dosage of metoprolol XL 100 mg daily and amlodipine 10 mg daily. Both will be on hold and likely will need to cut down dosages on discharge. Daughter aware. -echo done     Chronic A-fib -resume BB   Chronic urinary retention -Has been doing self catheterizing at home -foley placed by urology  Prostate CA -In Remission, continue home hormone therapy medication   Liver cyst,  GLUCAP 144* 122*   Lipid Profile: No results for input(s): "CHOL", "HDL", "LDLCALC", "TRIG", "CHOLHDL", "LDLDIRECT" in the last 72 hours. Thyroid Function Tests: No results for input(s): "TSH", "T4TOTAL", "FREET4", "T3FREE", "THYROIDAB" in the last 72 hours. Anemia Panel: No results for input(s): "VITAMINB12", "FOLATE", "FERRITIN", "TIBC", "IRON", "RETICCTPCT" in the last 72 hours. Sepsis Labs: Recent Labs  Lab 12/28/21 1043  LATICACIDVEN 4.9*    Recent Results (from the past 240 hour(s))  Culture, blood (routine x 2)     Status: None (Preliminary result)   Collection Time: 12/28/21 10:29 AM   Specimen: BLOOD RIGHT HAND  Result Value Ref Range Status   Specimen Description BLOOD RIGHT HAND  Final   Special Requests   Final    BOTTLES DRAWN AEROBIC AND ANAEROBIC Blood Culture results may not be optimal due to an inadequate volume of blood received in culture bottles   Culture   Final    NO GROWTH 2 DAYS Performed at [facility], [place] [address], [place], [place] [address]    Report Status PENDING  Incomplete  Culture, blood (routine x 2)     Status: None (Preliminary result)   Collection Time: 12/28/21 10:34 AM   Specimen: BLOOD RIGHT ARM   Result Value Ref Range Status   Specimen Description BLOOD RIGHT ARM  Final   Special Requests   Final    BOTTLES DRAWN AEROBIC AND ANAEROBIC Blood Culture adequate volume   Culture   Final    NO GROWTH 2 DAYS Performed at [facility], [place] [address], [place], [place] [address]    Report Status PENDING  Incomplete  Urine Culture     Status: Abnormal (Preliminary result)   Collection Time: 12/29/21  7:38 AM   Specimen: Urine, Catheterized  Result Value Ref Range Status   Specimen Description URINE, CATHETERIZED  Final   Special Requests NONE  Final   Culture (A)  Final    >=100,000 COLONIES/mL GRAM NEGATIVE RODS IDENTIFICATION AND SUSCEPTIBILITIES TO FOLLOW Performed at [facility], [place] [address] [place], [place] [address]    Report Status PENDING  Incomplete         Radiology Studies: DG CHEST PORT 1 VIEW  Result Date: 12/30/2021 CLINICAL DATA:  Status post fall on blood thinners. Chest tube in place. EXAM: PORTABLE CHEST 1 VIEW COMPARISON:  December 29, 2021 FINDINGS: The heart size and mediastinal contours are within normal limits. Pleural line is identified in the right apex suggesting minimal right apical pneumothorax. Right chest tube is unchanged. Minimal atelectasis of bilateral lung bases are noted. The visualized skeletal structures are stable. IMPRESSION: Pleural line is identified in the right apex suggesting minimal right apical pneumothorax. Right chest tube is unchanged. These results will be called to the ordering clinician or representative by the Radiologist Assistant, and communication documented in the PACS or [employer]. Electronically Signed   By: Abelardo Diesel M.D.   On: 12/30/2021 09:21   ECHOCARDIOGRAM COMPLETE  Result Date: 12/29/2021    ECHOCARDIOGRAM REPORT   Patient Name:   Jacob Becker Date of Exam: 12/29/2021 Medical Rec #:  [id number]        Height:       70.0 in Accession #:    [id number]       Weight:       150.0 lb Date of  Birth:  [birth year]        BSA:  GLUCAP 144* 122*   Lipid Profile: No results for input(s): "CHOL", "HDL", "LDLCALC", "TRIG", "CHOLHDL", "LDLDIRECT" in the last 72 hours. Thyroid Function Tests: No results for input(s): "TSH", "T4TOTAL", "FREET4", "T3FREE", "THYROIDAB" in the last 72 hours. Anemia Panel: No results for input(s): "VITAMINB12", "FOLATE", "FERRITIN", "TIBC", "IRON", "RETICCTPCT" in the last 72 hours. Sepsis Labs: Recent Labs  Lab 12/28/21 1043  LATICACIDVEN 4.9*    Recent Results (from the past 240 hour(s))  Culture, blood (routine x 2)     Status: None (Preliminary result)   Collection Time: 12/28/21 10:29 AM   Specimen: BLOOD RIGHT HAND  Result Value Ref Range Status   Specimen Description BLOOD RIGHT HAND  Final   Special Requests   Final    BOTTLES DRAWN AEROBIC AND ANAEROBIC Blood Culture results may not be optimal due to an inadequate volume of blood received in culture bottles   Culture   Final    NO GROWTH 2 DAYS Performed at [facility], [place] [address], [place], [place] [address]    Report Status PENDING  Incomplete  Culture, blood (routine x 2)     Status: None (Preliminary result)   Collection Time: 12/28/21 10:34 AM   Specimen: BLOOD RIGHT ARM   Result Value Ref Range Status   Specimen Description BLOOD RIGHT ARM  Final   Special Requests   Final    BOTTLES DRAWN AEROBIC AND ANAEROBIC Blood Culture adequate volume   Culture   Final    NO GROWTH 2 DAYS Performed at [facility], [place] [address], [place], [place] [address]    Report Status PENDING  Incomplete  Urine Culture     Status: Abnormal (Preliminary result)   Collection Time: 12/29/21  7:38 AM   Specimen: Urine, Catheterized  Result Value Ref Range Status   Specimen Description URINE, CATHETERIZED  Final   Special Requests NONE  Final   Culture (A)  Final    >=100,000 COLONIES/mL GRAM NEGATIVE RODS IDENTIFICATION AND SUSCEPTIBILITIES TO FOLLOW Performed at [facility], [place] [address] [place], [place] [address]    Report Status PENDING  Incomplete         Radiology Studies: DG CHEST PORT 1 VIEW  Result Date: 12/30/2021 CLINICAL DATA:  Status post fall on blood thinners. Chest tube in place. EXAM: PORTABLE CHEST 1 VIEW COMPARISON:  December 29, 2021 FINDINGS: The heart size and mediastinal contours are within normal limits. Pleural line is identified in the right apex suggesting minimal right apical pneumothorax. Right chest tube is unchanged. Minimal atelectasis of bilateral lung bases are noted. The visualized skeletal structures are stable. IMPRESSION: Pleural line is identified in the right apex suggesting minimal right apical pneumothorax. Right chest tube is unchanged. These results will be called to the ordering clinician or representative by the Radiologist Assistant, and communication documented in the PACS or [employer]. Electronically Signed   By: Abelardo Diesel M.D.   On: 12/30/2021 09:21   ECHOCARDIOGRAM COMPLETE  Result Date: 12/29/2021    ECHOCARDIOGRAM REPORT   Patient Name:   Jacob Becker Date of Exam: 12/29/2021 Medical Rec #:  [id number]        Height:       70.0 in Accession #:    [id number]       Weight:       150.0 lb Date of  Birth:  [birth year]        BSA:  PROGRESS NOTE    Jacob Becker  [id number] DOB: 27-Oct-1929 DOA: 12/28/2021 PCP: Kathalene Frames, MD    Brief Narrative:  Jacob Becker is a 88 y.o. male with medical history significant of advanced dementia, chronic A-fib on Xarelto, HTN, CAD, prostate cancer in remission on chronic hormone manipulation, hepatic and pancreatic cysts, presented with near syncope and fall.   Patient has advanced dementia and only thing he could remember was he started to feel lightheadedness since yesterday evening, and this morning, by accident, he locked himself inside the bedroom, and when family knocked the door from outside, patient did not realize and went to open the door, but lost his balance and fell on the right side of the chest.  No head injury, no LOC.  Daughter noticed the patient looked pale and clammy, and had slurred speech and confusion.  EMS arrived and found patient blood pressure in the lower 80s and bradycardia in the 50s.   According to patient's cardiologist note, patient has hypertension and A-fib but on the recent visit in April 2023, it was noticed the patient develop borderline bradycardia and borderline hypotension and cardiology recommended to cut down patient's metoprolol from 100 mg daily to 50 mg daily and also cut down amlodipine to 5 mg daily if necessary.  Daughter reported that the patient lives at home with wife and with her caregiver on site help two times a day.  For some reason patient did his medication recently including blood pressure, A-fib and Xarelto for at least 1 month.  And daughter found out the fact only last week and patient was restarted on all his medication 3 days ago.    Assessment and Plan: Right sided hemothorax, traumatic -Chest tube placed by trauma surgeon in the ED, repeat chest x-ray showed resolution of right-sided pneumothorax and chest tube in the upper mid chest -PT eval-- home health   Acute blood loss anemia -Secondary to rib  fracture and right-sided hemothorax -trend hgb -Given patient has acute anemia symptoms both hypotension and bradycardia, will give patient 1 unit of PRBC.  -Last dosage of Xarelto yesterday evening.  Continue to hold off Xarelto     Nondisplaced RIGHT anterior acetabular rim fracture.  -ortho consult- WBAT, no surgery needed  AKI -Secondary to hypotension and dehydration -CT abdomen pelvis pending to rule out renal obstructions -CR trending down -Hold off BP meds including metoprolol, amlodipine, HCTZ and ACEI    Hyperkalemia -resolved   Lactic acidosis -Likely secondary to hypoperfusion from hypotension and bradycardia and acute blood loss anemia.  Patient has no cough no urinary symptoms no diarrhea to indicate active infection, monitor off antibiotics. -follow cultures   Acute metabolic encephalopathy -delirium precautions    Near syncope -Likely from commendation of hypotension and bradycardia. -On most recent cardiology evaluation in April 2023, cardiology cut down patient's metoprolol to 50 mg daily and plan to further cut down patient's amlodipine as patient continues to lose weight.  Cardiology given and consider readjust Xarelto in the future. However, I called patient's pharmacy [facility] and patient still taking same dosage of metoprolol XL 100 mg daily and amlodipine 10 mg daily. Both will be on hold and likely will need to cut down dosages on discharge. Daughter aware. -echo done     Chronic A-fib -resume BB   Chronic urinary retention -Has been doing self catheterizing at home -foley placed by urology  Prostate CA -In Remission, continue home hormone therapy medication   Liver cyst,  GLUCAP 144* 122*   Lipid Profile: No results for input(s): "CHOL", "HDL", "LDLCALC", "TRIG", "CHOLHDL", "LDLDIRECT" in the last 72 hours. Thyroid Function Tests: No results for input(s): "TSH", "T4TOTAL", "FREET4", "T3FREE", "THYROIDAB" in the last 72 hours. Anemia Panel: No results for input(s): "VITAMINB12", "FOLATE", "FERRITIN", "TIBC", "IRON", "RETICCTPCT" in the last 72 hours. Sepsis Labs: Recent Labs  Lab 12/28/21 1043  LATICACIDVEN 4.9*    Recent Results (from the past 240 hour(s))  Culture, blood (routine x 2)     Status: None (Preliminary result)   Collection Time: 12/28/21 10:29 AM   Specimen: BLOOD RIGHT HAND  Result Value Ref Range Status   Specimen Description BLOOD RIGHT HAND  Final   Special Requests   Final    BOTTLES DRAWN AEROBIC AND ANAEROBIC Blood Culture results may not be optimal due to an inadequate volume of blood received in culture bottles   Culture   Final    NO GROWTH 2 DAYS Performed at [facility], [place] [address], [place], [place] [address]    Report Status PENDING  Incomplete  Culture, blood (routine x 2)     Status: None (Preliminary result)   Collection Time: 12/28/21 10:34 AM   Specimen: BLOOD RIGHT ARM   Result Value Ref Range Status   Specimen Description BLOOD RIGHT ARM  Final   Special Requests   Final    BOTTLES DRAWN AEROBIC AND ANAEROBIC Blood Culture adequate volume   Culture   Final    NO GROWTH 2 DAYS Performed at [facility], [place] [address], [place], [place] [address]    Report Status PENDING  Incomplete  Urine Culture     Status: Abnormal (Preliminary result)   Collection Time: 12/29/21  7:38 AM   Specimen: Urine, Catheterized  Result Value Ref Range Status   Specimen Description URINE, CATHETERIZED  Final   Special Requests NONE  Final   Culture (A)  Final    >=100,000 COLONIES/mL GRAM NEGATIVE RODS IDENTIFICATION AND SUSCEPTIBILITIES TO FOLLOW Performed at [facility], [place] [address] [place], [place] [address]    Report Status PENDING  Incomplete         Radiology Studies: DG CHEST PORT 1 VIEW  Result Date: 12/30/2021 CLINICAL DATA:  Status post fall on blood thinners. Chest tube in place. EXAM: PORTABLE CHEST 1 VIEW COMPARISON:  December 29, 2021 FINDINGS: The heart size and mediastinal contours are within normal limits. Pleural line is identified in the right apex suggesting minimal right apical pneumothorax. Right chest tube is unchanged. Minimal atelectasis of bilateral lung bases are noted. The visualized skeletal structures are stable. IMPRESSION: Pleural line is identified in the right apex suggesting minimal right apical pneumothorax. Right chest tube is unchanged. These results will be called to the ordering clinician or representative by the Radiologist Assistant, and communication documented in the PACS or [employer]. Electronically Signed   By: Abelardo Diesel M.D.   On: 12/30/2021 09:21   ECHOCARDIOGRAM COMPLETE  Result Date: 12/29/2021    ECHOCARDIOGRAM REPORT   Patient Name:   Jacob Becker Date of Exam: 12/29/2021 Medical Rec #:  [id number]        Height:       70.0 in Accession #:    [id number]       Weight:       150.0 lb Date of  Birth:  [birth year]        BSA:  PROGRESS NOTE    Jacob Becker  [id number] DOB: 27-Oct-1929 DOA: 12/28/2021 PCP: Kathalene Frames, MD    Brief Narrative:  Jacob Becker is a 88 y.o. male with medical history significant of advanced dementia, chronic A-fib on Xarelto, HTN, CAD, prostate cancer in remission on chronic hormone manipulation, hepatic and pancreatic cysts, presented with near syncope and fall.   Patient has advanced dementia and only thing he could remember was he started to feel lightheadedness since yesterday evening, and this morning, by accident, he locked himself inside the bedroom, and when family knocked the door from outside, patient did not realize and went to open the door, but lost his balance and fell on the right side of the chest.  No head injury, no LOC.  Daughter noticed the patient looked pale and clammy, and had slurred speech and confusion.  EMS arrived and found patient blood pressure in the lower 80s and bradycardia in the 50s.   According to patient's cardiologist note, patient has hypertension and A-fib but on the recent visit in April 2023, it was noticed the patient develop borderline bradycardia and borderline hypotension and cardiology recommended to cut down patient's metoprolol from 100 mg daily to 50 mg daily and also cut down amlodipine to 5 mg daily if necessary.  Daughter reported that the patient lives at home with wife and with her caregiver on site help two times a day.  For some reason patient did his medication recently including blood pressure, A-fib and Xarelto for at least 1 month.  And daughter found out the fact only last week and patient was restarted on all his medication 3 days ago.    Assessment and Plan: Right sided hemothorax, traumatic -Chest tube placed by trauma surgeon in the ED, repeat chest x-ray showed resolution of right-sided pneumothorax and chest tube in the upper mid chest -PT eval-- home health   Acute blood loss anemia -Secondary to rib  fracture and right-sided hemothorax -trend hgb -Given patient has acute anemia symptoms both hypotension and bradycardia, will give patient 1 unit of PRBC.  -Last dosage of Xarelto yesterday evening.  Continue to hold off Xarelto     Nondisplaced RIGHT anterior acetabular rim fracture.  -ortho consult- WBAT, no surgery needed  AKI -Secondary to hypotension and dehydration -CT abdomen pelvis pending to rule out renal obstructions -CR trending down -Hold off BP meds including metoprolol, amlodipine, HCTZ and ACEI    Hyperkalemia -resolved   Lactic acidosis -Likely secondary to hypoperfusion from hypotension and bradycardia and acute blood loss anemia.  Patient has no cough no urinary symptoms no diarrhea to indicate active infection, monitor off antibiotics. -follow cultures   Acute metabolic encephalopathy -delirium precautions    Near syncope -Likely from commendation of hypotension and bradycardia. -On most recent cardiology evaluation in April 2023, cardiology cut down patient's metoprolol to 50 mg daily and plan to further cut down patient's amlodipine as patient continues to lose weight.  Cardiology given and consider readjust Xarelto in the future. However, I called patient's pharmacy [facility] and patient still taking same dosage of metoprolol XL 100 mg daily and amlodipine 10 mg daily. Both will be on hold and likely will need to cut down dosages on discharge. Daughter aware. -echo done     Chronic A-fib -resume BB   Chronic urinary retention -Has been doing self catheterizing at home -foley placed by urology  Prostate CA -In Remission, continue home hormone therapy medication   Liver cyst,

## 2021-12-30 NOTE — Progress Notes (Signed)
Transition of Care [facility]) - CAGE-AID Screening   Patient Details  Name: Jacob Becker MRN: [id number] Date of Birth: September 25, 1929   Clinical Narrative:   [name] is a 88 y.o. male with a history of dementia who is currently being treated for a right-sided hemothorax as a result from fall.  Pt reports no concern with drugs or alcohol.     CAGE-AID Screening:    Have You Ever Felt You Ought to Cut Down on Your Drinking or Drug Use?: No Have People Annoyed You By Critizing Your Drinking Or Drug Use?: No Have You Felt Bad Or Guilty About Your Drinking Or Drug Use?: No Have You Ever Had a Drink or Used Drugs First Thing In The Morning to Steady Your Nerves or to Get Rid of a Hangover?: No CAGE-AID Score: 0  Substance Abuse Education Offered: No

## 2021-12-30 NOTE — Evaluation (Signed)
Occupational Therapy Evaluation Patient Details Name: Jacob Becker MRN: [id number] DOB: 1929-12-04 Today's Date: 12/30/2021   History of Present Illness 88 y.o. male presents to [facility] hospital on 12/28/2021 after near syncope and a fall. Pt found to have R hemothorax, R rib 10-11 fx, R nondisplaced anterior wall acetabular fx. PMH includes dementia, afib, HTN, CAD, prostate CA.   Clinical Impression   This 88 yo male admitted with above presents to acute OT with PLOF of being at home and getting around with a walking stick with being able to bath, dress, and toilet himself; as well as help his wife (non-verbal, ambulatory, advanced dementia) if she needed something. Has caregivers in AM and afternoon only. He will continue to benefit from acute OT with follow up HHOT as long as he has 24/7 S/prn A at home. We will continue to follow.      Recommendations for follow up therapy are one component of a multi-disciplinary discharge planning process, led by the attending physician.  Recommendations may be updated based on patient status, additional functional criteria and insurance authorization.   Follow Up Recommendations  Home health OT     Assistance Recommended at Discharge Frequent or constant Supervision/Assistance  Patient can return home with the following A little help with walking and/or transfers;A little help with bathing/dressing/bathroom;Help with stairs or ramp for entrance;Assistance with cooking/housework;Assist for transportation;Direct supervision/assist for medications management;Direct supervision/assist for financial management    Functional Status Assessment  Patient has had a recent decline in their functional status and demonstrates the ability to make significant improvements in function in a reasonable and predictable amount of time.  Equipment Recommendations   (rollator v. RW (have asked PT to try a rollator with him 12/4 to see which one is best).)        Precautions / Restrictions Precautions Precautions: Fall Precaution Comments: chest tube Restrictions Weight Bearing Restrictions: No      Mobility Bed Mobility  S OOB with HOB flat, one rail                  Transfers  Min guard A sit<>stand with RW (VCs for safe hand placement)                        Balance Overall balance assessment: Needs assistance Sitting-balance support: No upper extremity supported, Feet supported Sitting balance-Leahy Scale: Good     Standing balance support: Bilateral upper extremity supported, Reliant on assistive device for balance Standing balance-Leahy Scale: Poor                             ADL either performed or assessed with clinical judgement   ADL Overall ADL's : Needs assistance/impaired Eating/Feeding: Independent;Sitting   Grooming: Set up;Sitting   Upper Body Bathing: Set up;Sitting   Lower Body Bathing: Min guard;Sit to/from stand   Upper Body Dressing : Set up;Sitting   Lower Body Dressing: Min guard;Sit to/from stand   Toilet Transfer: Min guard;Ambulation;Rolling walker (2 wheels)   Toileting- Clothing Manipulation and Hygiene: Min guard;Sit to/from stand               Vision Patient Visual Report: No change from baseline              Pertinent Vitals/Pain Pain Assessment Pain Assessment: No/denies pain     Hand Dominance Right   Extremity/Trunk Assessment Upper Extremity Assessment Upper Extremity Assessment: Overall [facility]  for tasks assessed           Communication Communication Communication: No difficulties   Cognition Arousal/Alertness: Awake/alert Behavior During Therapy: WFL for tasks assessed/performed Overall Cognitive Status: History of cognitive impairments - at baseline                                 General Comments: dementia at baseline, tangential thought at times. poor memory. PT who saw pt earlier had told pt that he would recommend  he use the RW for now due to decreased balance and now s/p fall--upon telling pt I was going to get the walker for him to use he said he didn't believe he needed it. I reminded him about eariler when the PT talked to him about using it for now and he needed to keep practicing with it, so pt was agreeable.                Home Living Family/patient expects to be discharged to:: Private residence Living Arrangements: Spouse/significant other Available Help at Discharge: Personal care attendant (spouse has dementia--caregivers are there AM and early afternoon--pt and wife home alone mid-day and evening/nights) Type of Home: House Home Access: Stairs to enter Secretary/administrator of Steps: 4 Entrance Stairs-Rails: None Home Layout: One level     Bathroom Shower/Tub: Producer, television/film/video: Handicapped height     Home Equipment:  (walking stick)          Prior Functioning/Environment Prior Level of Function : Needs assist             Mobility Comments: ambulatory with use of a walking stick ADLs Comments: caregivers perform IADLs, pt is independent with ADLs        OT Problem List: Impaired balance (sitting and/or standing);Decreased cognition      OT Treatment/Interventions: Self-care/ADL training;DME and/or AE instruction;Balance training;Patient/family education    OT Goals(Current goals can be found in the care plan section) Acute Rehab OT Goals Patient Stated Goal: to get back home OT Goal Formulation: With patient Time For Goal Achievement: 01/13/22 Potential to Achieve Goals: Good  OT Frequency: Min 2X/week       AM-PAC OT "6 Clicks" Daily Activity     Outcome Measure Help from another person eating meals?: None Help from another person taking care of personal grooming?: A Little Help from another person toileting, which includes using toliet, bedpan, or urinal?: A Little Help from another person bathing (including washing, rinsing, drying)?: A  Little Help from another person to put on and taking off regular upper body clothing?: A Little Help from another person to put on and taking off regular lower body clothing?: A Little 6 Click Score: 19   End of Session Equipment Utilized During Treatment: Gait belt;Rolling walker (2 wheels)  Activity Tolerance: Patient tolerated treatment well Patient left: in chair;with call bell/phone within reach;with chair alarm set  OT Visit Diagnosis: Other abnormalities of gait and mobility (R26.89);Muscle weakness (generalized) (M62.81);History of falling (Z91.81);Other symptoms and signs involving cognitive function                Time: 7829-5621 OT Time Calculation (min): 23 min Charges:  OT General Charges $OT Visit: 1 Visit OT Evaluation $OT Eval Moderate Complexity: 1 Mod OT Treatments $Self Care/Home Management : 8-22 mins  Ignacia Palma, OTR/L Acute Rehab Services [facility] [phone] Office [phone]    Evette Georges 12/30/2021, 2:23  PM

## 2021-12-31 ENCOUNTER — Inpatient Hospital Stay (HOSPITAL_COMMUNITY): Payer: Medicare Other

## 2021-12-31 DIAGNOSIS — J942 Hemothorax: Secondary | ICD-10-CM | POA: Diagnosis not present

## 2021-12-31 LAB — BASIC METABOLIC PANEL
Anion gap: 7 (ref 5–15)
BUN: 45 mg/dL — ABNORMAL HIGH (ref 8–23)
CO2: 22 mmol/L (ref 22–32)
Calcium: 7.7 mg/dL — ABNORMAL LOW (ref 8.9–10.3)
Chloride: 105 mmol/L (ref 98–111)
Creatinine, Ser: 1.28 mg/dL — ABNORMAL HIGH (ref 0.61–1.24)
GFR, Estimated: 53 mL/min — ABNORMAL LOW (ref >60)
Glucose, Bld: 136 mg/dL — ABNORMAL HIGH (ref 70–99)
Potassium: 3.9 mmol/L (ref 3.5–5.1)
Sodium: 134 mmol/L — ABNORMAL LOW (ref 135–145)

## 2021-12-31 LAB — LACTIC ACID, PLASMA: Lactic Acid, Venous: 1.4 mmol/L (ref 0.5–1.9)

## 2021-12-31 LAB — CBC
HCT: 25.1 % — ABNORMAL LOW (ref 39.0–52.0)
Hemoglobin: 8.2 g/dL — ABNORMAL LOW (ref 13.0–17.0)
MCH: 31.3 pg (ref 26.0–34.0)
MCHC: 32.7 g/dL (ref 30.0–36.0)
MCV: 95.8 fL (ref 80.0–100.0)
Platelets: 140 10*3/uL — ABNORMAL LOW (ref 150–400)
RBC: 2.62 MIL/uL — ABNORMAL LOW (ref 4.22–5.81)
RDW: 14.9 % (ref 11.5–15.5)
WBC: 11 10*3/uL — ABNORMAL HIGH (ref 4.0–10.5)
nRBC: 0 % (ref 0.0–0.2)

## 2021-12-31 LAB — URINE CULTURE: Culture: >=100000 — AB

## 2021-12-31 MED ORDER — LORAZEPAM 2 MG/ML IJ SOLN
0.2500 mg | Freq: Once | INTRAMUSCULAR | Status: AC
  Administered 2021-12-31: 0.25 mg via INTRAVENOUS
  Filled 2021-12-31: qty 1

## 2021-12-31 MED ORDER — HALOPERIDOL LACTATE 5 MG/ML IJ SOLN
2.5000 mg | Freq: Four times a day (QID) | INTRAMUSCULAR | Status: AC | PRN
  Administered 2022-01-09 – 2022-01-10 (×2): 2.5 mg via INTRAVENOUS
  Filled 2021-12-31 (×2): qty 1

## 2021-12-31 NOTE — Progress Notes (Signed)
[facility] Progress Note     Subjective: CC-  Alert. Denies chest pain. Still confused.  Objective: Vital signs in last 24 hours: Temp:  [97.8 F (36.6 C)-98.7 F (37.1 C)] 98.7 F (37.1 C) (12/04 0615) Pulse Rate:  [70-88] 70 (12/04 0615) Resp:  [18] 18 (12/04 0615) BP: (121-178)/(70-81) 178/77 (12/04 0615) SpO2:  [98 %-99 %] 98 % (12/04 0615) Weight:  [73.9 kg] 73.9 kg (12/04 0615) Last BM Date :  (unknown)  Intake/Output from previous day: 12/03 0701 - 12/04 0700 In: 400 [P.O.:400] Out: 1120 [Urine:950; Chest Tube:170] Intake/Output this shift: No intake/output data recorded.  PE: Gen:  Alert, NAD Card:  normal heart rate Pulm:  CTAB, no W/R/R, rate and effort normal on room air. Right chest tube without air leak. Pulling 400 on IS Abd: Soft, NT/ND Ext:  no BUE/BLE edema, calves soft and nontender. Left elbow lac with sutures intact, mild erythema but no fluctuance or drainage, able to actively range the elbow without pain Psych: Alert, oriented to self only  Skin: no rashes noted, warm and dry  Lab Results:  Recent Labs    12/30/21 0149 12/31/21 0326  WBC 9.3 11.0*  HGB 8.1* 8.2*  HCT 24.8* 25.1*  PLT 134* 140*   BMET Recent Labs    12/30/21 1031 12/31/21 0326  NA 138 134*  K 3.6 3.9  CL 111 105  CO2 20* 22  GLUCOSE 139* 136*  BUN 40* 45*  CREATININE 1.34* 1.28*  CALCIUM 8.1* 7.7*   PT/INR Recent Labs    12/28/21 1043  LABPROT 21.5*  INR 1.9*   CMP     Component Value Date/Time   NA 134 (L) 12/31/2021 0326   K 3.9 12/31/2021 0326   CL 105 12/31/2021 0326   CO2 22 12/31/2021 0326   GLUCOSE 136 (H) 12/31/2021 0326   BUN 45 (H) 12/31/2021 0326   CREATININE 1.28 (H) 12/31/2021 0326   CALCIUM 7.7 (L) 12/31/2021 0326   PROT 5.4 (L) 12/28/2021 1043   ALBUMIN 2.5 (L) 12/28/2021 1043   AST 32 12/28/2021 1043   ALT 17 12/28/2021 1043   ALKPHOS 126 12/28/2021 1043   BILITOT 0.6 12/28/2021 1043   GFRNONAA 53 (L) 12/31/2021  0326   Lipase  No results found for: "LIPASE"     Studies/Results: DG CHEST PORT 1 VIEW  Result Date: 12/31/2021 CLINICAL DATA:  Chest tube EXAM: PORTABLE CHEST 1 VIEW COMPARISON:  Portable exam 0611 hours compared to 12/30/2021 FINDINGS: RIGHT thoracostomy tube stable. Upper normal heart size. Mediastinal contours and pulmonary vascularity normal. Atherosclerotic calcification aorta. RIGHT basilar atelectasis again seen with persistent small RIGHT apex pneumothorax. LEFT lung clear. Bones demineralized. IMPRESSION: Persistent small RIGHT apex pneumothorax despite thoracostomy tube. RIGHT basilar atelectasis. Aortic Atherosclerosis (ICD10-I70.0). Electronically Signed   By: Ulyses Southward M.D.   On: 12/31/2021 08:22   DG CHEST PORT 1 VIEW  Result Date: 12/30/2021 CLINICAL DATA:  Status post fall on blood thinners. Chest tube in place. EXAM: PORTABLE CHEST 1 VIEW COMPARISON:  December 29, 2021 FINDINGS: The heart size and mediastinal contours are within normal limits. Pleural line is identified in the right apex suggesting minimal right apical pneumothorax. Right chest tube is unchanged. Minimal atelectasis of bilateral lung bases are noted. The visualized skeletal structures are stable. IMPRESSION: Pleural line is identified in the right apex suggesting minimal right apical pneumothorax. Right chest tube is unchanged. These results will be called to the ordering clinician or representative by the Radiologist  Geophysicist/field seismologist, and communication documented in the PACS or [employer]. Electronically Signed   By: Sherian Rein M.D.   On: 12/30/2021 09:21   ECHOCARDIOGRAM COMPLETE  Result Date: 12/29/2021    ECHOCARDIOGRAM REPORT   Patient Name:   Jacob Becker Date of Exam: 12/29/2021 Medical Rec #:  [id number]        Height:       70.0 in Accession #:    [id number]       Weight:       150.0 lb Date of Birth:  December 22, 1929        BSA:          1.847 m Patient Age:    88 years         BP:           126/52  mmHg Patient Gender: M                HR:           71 bpm. Exam Location:  Inpatient Procedure: 2D Echo Indications:    syncope  History:        Patient has no prior history of Echocardiogram examinations.                 Arrythmias:Atrial Fibrillation.  Sonographer:    Delcie Roch RDCS Referring Phys: [id number] [facility]  Sonographer Comments: Image acquisition challenging due to respiratory motion. IMPRESSIONS  1. Left ventricular ejection fraction, by estimation, is 60 to 65%. The left ventricle has normal function. The left ventricle has no regional wall motion abnormalities. There is moderate left ventricular hypertrophy of the basal-septal segment. Left ventricular diastolic parameters are indeterminate.  2. Right ventricular systolic function is normal. The right ventricular size is normal. There is normal pulmonary artery systolic pressure.  3. Left atrial size was moderately dilated.  4. Right atrial size was moderately dilated.  5. The mitral valve is normal in structure. No evidence of mitral valve regurgitation. No evidence of mitral stenosis.  6. The aortic valve is calcified. There is moderate calcification of the aortic valve. Aortic valve regurgitation is moderate. Aortic valve sclerosis is present, with no evidence of aortic valve stenosis. Aortic valve mean gradient measures 8.2 mmHg. Aortic valve Vmax measures 1.98 m/s.  7. The inferior vena cava is normal in size with greater than 50% respiratory variability, suggesting right atrial pressure of 3 mmHg. FINDINGS  Left Ventricle: Left ventricular ejection fraction, by estimation, is 60 to 65%. The left ventricle has normal function. The left ventricle has no regional wall motion abnormalities. The left ventricular internal cavity size was normal in size. There is  moderate left ventricular hypertrophy of the basal-septal segment. Left ventricular diastolic parameters are indeterminate. Right Ventricle: The right ventricular size is normal.  No increase in right ventricular wall thickness. Right ventricular systolic function is normal. There is normal pulmonary artery systolic pressure. The tricuspid regurgitant velocity is 2.66 m/s, and  with an assumed right atrial pressure of 3 mmHg, the estimated right ventricular systolic pressure is 31.3 mmHg. Left Atrium: Left atrial size was moderately dilated. Right Atrium: Right atrial size was moderately dilated. Pericardium: There is no evidence of pericardial effusion. Mitral Valve: The mitral valve is normal in structure. No evidence of mitral valve regurgitation. No evidence of mitral valve stenosis. Tricuspid Valve: The tricuspid valve is normal in structure. Tricuspid valve regurgitation is mild . No evidence of tricuspid stenosis. Aortic Valve: The aortic valve is calcified.  There is moderate calcification of the aortic valve. Aortic valve regurgitation is moderate. Aortic valve sclerosis is present, with no evidence of aortic valve stenosis. Aortic valve mean gradient measures 8.2 mmHg. Aortic valve peak gradient measures 15.6 mmHg. Aortic valve area, by VTI measures 2.55 cm. Pulmonic Valve: The pulmonic valve was normal in structure. Pulmonic valve regurgitation is mild. No evidence of pulmonic stenosis. Aorta: The aortic root is normal in size and structure. Venous: The inferior vena cava is normal in size with greater than 50% respiratory variability, suggesting right atrial pressure of 3 mmHg. IAS/Shunts: No atrial level shunt detected by color flow Doppler.  LEFT VENTRICLE PLAX 2D LVIDd:         4.60 cm LVIDs:         2.70 cm LV PW:         1.10 cm LV IVS:        1.40 cm LVOT diam:     2.50 cm LV SV:         85 LV SV Index:   46 LVOT Area:     4.91 cm  RIGHT VENTRICLE RV S prime:     16.10 cm/s LEFT ATRIUM             Index        RIGHT ATRIUM           Index LA diam:        5.30 cm 2.87 cm/m   RA Area:     22.90 cm LA Vol (A2C):   75.0 ml 40.60 ml/m  RA Volume:   69.80 ml  37.79 ml/m LA  Vol (A4C):   81.7 ml 44.23 ml/m LA Biplane Vol: 80.9 ml 43.80 ml/m  AORTIC VALVE AV Area (Vmax):    2.62 cm AV Area (Vmean):   2.51 cm AV Area (VTI):     2.55 cm AV Vmax:           197.50 cm/s AV Vmean:          131.750 cm/s AV VTI:            0.333 m AV Peak Grad:      15.6 mmHg AV Mean Grad:      8.2 mmHg LVOT Vmax:         105.47 cm/s LVOT Vmean:        67.400 cm/s LVOT VTI:          0.173 m LVOT/AV VTI ratio: 0.52  AORTA Ao Root diam: 3.60 cm Ao Asc diam:  3.50 cm TRICUSPID VALVE TR Peak grad:   28.3 mmHg TR Vmax:        266.00 cm/s  SHUNTS Systemic VTI:  0.17 m Systemic Diam: 2.50 cm Donato Schultz MD Electronically signed by Donato Schultz MD Signature Date/Time: 12/29/2021/5:28:41 PM    Final     Anti-infectives: Anti-infectives (From admission, onward)    Start     Dose/Rate Route Frequency Ordered Stop   12/28/21 1215  cefTRIAXone (ROCEPHIN) 1 g in sodium chloride 0.9 % 100 mL IVPB  Status:  Discontinued        1 g 200 mL/hr over 30 Minutes Intravenous  Once 12/28/21 1205 12/28/21 1320   12/28/21 1215  azithromycin (ZITHROMAX) 500 mg in sodium chloride 0.9 % 250 mL IVPB  Status:  Discontinued        500 mg 250 mL/hr over 60 Minutes Intravenous  Once 12/28/21 1205 12/28/21 1320  Assessment/Plan Fall on xarelto R hemothorax - s/p chest tube placement 12/2. Apical PNX on CXR this morning. Continue chest tube to -20  suction today and repeat CXR in AM. Encourage IS. CT output 170 mL. R rib fractures 10-11 - multimodal pain control and pulm toilet ABL anemia - s/p 1u PRBCs 12/1. Hgb 8.2 from 8.1, stable Right anterior acetabular rib fx - per ortho, Dr. Sherilyn Dacosta, nonop, WBAT RLE Chronic A fib on xarelto - would consider holding anticoagulation indefinitely  Chronic urinary retention - I&O caths at home. Foley placed by urology 12/2 Prostate cancer HTN HLD   ID - none currently FEN - IVF per TRH, HH/CM diet VTE - SCDs, ok for chemical dvt ppx from surgical standpoint Foley -  placed by urology 12/2   Dispo - Chest tube as above. PT/OT.    I reviewed hospitalist notes, last 24 h vitals and pain scores, last 48 h intake and output, last 24 h labs and trends, and last 24 h imaging results.    LOS: 3 days    Adam Phenix, [facility] Surgery 12/31/2021, 9:30 AM Please see Amion for pager number during day hours 7:00am-4:30pm

## 2021-12-31 NOTE — Progress Notes (Signed)
Mobility Specialist - Progress Note   12/31/21 1100  Mobility  Activity Ambulated with assistance to bathroom  Level of Assistance Contact guard assist, steadying assist  Assistive Device Front wheel walker  Distance Ambulated (ft) 10 ft  Activity Response Tolerated well  Mobility Referral Yes  $Mobility charge 1 Mobility    Pt received in bed requesting to use BR. After a BM, pt requested to sit in recliner. Left in recliner w/ RN present in room.   Lewie Loron Mobility Specialist Please contact via SecureChat or Rehab office at [phone]

## 2021-12-31 NOTE — TOC Initial Note (Addendum)
Transition of Care (TOC) - Initial/Assessment Note   Patient asleep. Spoke to patient's daughter Dondra Spry at bedside.   Patient from home ( confirmed address in computer) with wife. Daughter has arranged 24/7 caregivers for her parents . In agreement with home health PT,OT,RN. Provided medicare.gov list, no preference . Referral given to and accepted by [facility] with [facility].   Patient will discharge with foley catheter.  Dondra Spry is contact person for [facility] [phone]   PT to work with patient again and try rollator vs rolling walker. Dondra Spry only wants rollator if medicare covers it. Explained if PT recommends [employer]  rollator will submit order to insurance to if if covered 100%  Patient still has chest tube in  Patient Details  Name: Jacob Becker MRN: [id number] Date of Birth: 02/10/1929  Transition of Care [facility]) CM/SW Contact:    Kingsley Plan, RN Phone Number: 12/31/2021, 2:25 PM  Clinical Narrative:                   Expected Discharge Plan: Home w Home Health Services Barriers to Discharge: Continued Medical Work up   Patient Goals and CMS Choice   CMS Medicare.gov Compare Post Acute Care list provided to:: Patient Represenative (must comment) (daughter Dondra Spry) Choice offered to / list presented to : Adult Children  Expected Discharge Plan and Services Expected Discharge Plan: Home w Home Health Services   Discharge Planning Services: CM Consult Post Acute Care Choice: Home Health Living arrangements for the past 2 months: Single Family Home                 DME Arranged:  (awaiting decision walker vs rollator)         HH Arranged: RN, PT, OT HH Agency: Other - See comment ([facility]) Date HH Agency Contacted: 12/31/21 Time HH Agency Contacted: 1425 Representative spoke with at [facility] Agency: Eber Jones  Prior Living Arrangements/Services Living arrangements for the past 2 months: Single Family Home Lives with:: Spouse Patient language and need for  interpreter reviewed:: Yes        Need for Family Participation in Patient Care: Yes (Comment) Care giver support system in place?: Yes (comment)   Criminal Activity/Legal Involvement Pertinent to Current Situation/Hospitalization: No - Comment as needed  Activities of Daily Living   ADL Screening (condition at time of admission) Patient's cognitive ability adequate to safely complete daily activities?: No Is the patient deaf or have difficulty hearing?: No Does the patient have difficulty seeing, even when wearing glasses/contacts?: No Does the patient have difficulty concentrating, remembering, or making decisions?: Yes Patient able to express need for assistance with ADLs?: No Does the patient have difficulty dressing or bathing?: Yes Independently performs ADLs?: No  Permission Sought/Granted                  Emotional Assessment              Admission diagnosis:  Hemothorax [J94.2] Lactic acidosis [E87.20] Hemothorax on right [J94.2] Near syncope [R55] Fall, initial encounter [W19.XXXA] Patient Active Problem List   Diagnosis Date Noted   Near syncope 12/28/2021   AKI (acute kidney injury) (HCC) 12/28/2021   Hemothorax on right 12/28/2021   Hyperkalemia 12/28/2021   A-fib (HCC) 12/28/2021   PCP:  Emilio Aspen, MD Pharmacy:   [facility] [place], [place] - [address] [place] [address] Phone: [phone] Fax: [phone]  Social Determinants of Health (SDOH) Interventions    Readmission Risk Interventions     No data to display

## 2021-12-31 NOTE — Progress Notes (Signed)
tube. RIGHT basilar atelectasis. Aortic Atherosclerosis (ICD10-I70.0). Electronically Signed   By: Ulyses Southward M.D.   On: 12/31/2021 08:22   DG CHEST PORT 1 VIEW  Result Date: 12/30/2021 CLINICAL DATA:  Status post fall on blood thinners. Chest tube in place. EXAM: PORTABLE CHEST 1 VIEW COMPARISON:  December 29, 2021 FINDINGS: The heart size and mediastinal contours are within normal limits. Pleural line is identified in the right apex suggesting minimal right apical pneumothorax. Right chest tube is unchanged. Minimal atelectasis of bilateral lung bases are noted. The visualized skeletal structures are stable. IMPRESSION: Pleural line is identified in the right apex suggesting minimal right apical pneumothorax. Right chest tube is unchanged. These results will be called to the ordering clinician or representative by the Radiologist Assistant, and communication documented in the PACS or [employer]. Electronically Signed   By: Sherian Rein M.D.   On: 12/30/2021 09:21   ECHOCARDIOGRAM COMPLETE  Result Date: 12/29/2021    ECHOCARDIOGRAM REPORT   Patient Name:   Jacob Becker Date of Exam: 12/29/2021 Medical Rec #:  [id number]        Height:       70.0 in Accession #:    [id number]       Weight:       150.0 lb Date of Birth:  Jan 13, 1930        BSA:          1.847 m Patient Age:    88 years         BP:           126/52 mmHg Patient Gender: M                HR:           71 bpm. Exam Location:  Inpatient Procedure: 2D Echo Indications:    syncope  History:        Patient has no prior history of Echocardiogram examinations.                 Arrythmias:Atrial Fibrillation.  Sonographer:    Delcie Roch RDCS Referring Phys: [id number] [facility]  Sonographer Comments: Image acquisition challenging due to respiratory motion. IMPRESSIONS  1. Left ventricular  ejection fraction, by estimation, is 60 to 65%. The left ventricle has normal function. The left ventricle has no regional wall motion abnormalities. There is moderate left ventricular hypertrophy of the basal-septal segment. Left ventricular diastolic parameters are indeterminate.  2. Right ventricular systolic function is normal. The right ventricular size is normal. There is normal pulmonary artery systolic pressure.  3. Left atrial size was moderately dilated.  4. Right atrial size was moderately dilated.  5. The mitral valve is normal in structure. No evidence of mitral valve regurgitation. No evidence of mitral stenosis.  6. The aortic valve is calcified. There is moderate calcification of the aortic valve. Aortic valve regurgitation is moderate. Aortic valve sclerosis is present, with no evidence of aortic valve stenosis. Aortic valve mean gradient measures 8.2 mmHg. Aortic valve Vmax measures 1.98 m/s.  7. The inferior vena cava is normal in size with greater than 50% respiratory variability, suggesting right atrial pressure of 3 mmHg. FINDINGS  Left Ventricle: Left ventricular ejection fraction, by estimation, is 60 to 65%. The left ventricle has normal function. The left ventricle has no regional wall motion abnormalities. The left ventricular internal cavity size was normal in size. There is  moderate left ventricular hypertrophy of the basal-septal segment. Left ventricular diastolic  PROGRESS NOTE    Jacob Becker  [id number] DOB: [birth year] DOA: 12/28/2021 PCP: Kathalene Frames, MD    Brief Narrative:  Jacob Becker is a 88 y.o. male with medical history significant of advanced dementia, chronic A-fib on Xarelto, HTN, CAD, prostate cancer in remission on chronic hormone manipulation, hepatic and pancreatic cysts, presented with near syncope and fall.   Patient has advanced dementia and only thing he could remember was he started to feel lightheadedness since yesterday evening, and this morning, by accident, he locked himself inside the bedroom, and when family knocked the door from outside, patient did not realize and went to open the door, but lost his balance and fell on the right side of the chest.  No head injury, no LOC.  Daughter noticed the patient looked pale and clammy, and had slurred speech and confusion.  EMS arrived and found patient blood pressure in the lower 80s and bradycardia in the 50s.   According to patient's cardiologist note, patient has hypertension and A-fib but on the recent visit in April 2023, it was noticed the patient develop borderline bradycardia and borderline hypotension and cardiology recommended to cut down patient's metoprolol from 100 mg daily to 50 mg daily and also cut down amlodipine to 5 mg daily if necessary.  Daughter reported that the patient lives at home with wife and with her caregiver on site help two times a day.  For some reason patient did his medication recently including blood pressure, A-fib and Xarelto for at least 1 month.  And daughter found out the fact only last week and patient was restarted on all his medication 3 days ago.    Assessment and Plan: Right sided hemothorax, traumatic -Chest tube placed by trauma surgeon in the ED, repeat chest x-ray showed resolution of right-sided pneumothorax and chest tube in the upper mid chest -PT eval-- home health   Acute blood loss anemia -Secondary to rib  fracture and right-sided hemothorax -trend hgb -Given patient has acute anemia symptoms both hypotension and bradycardia, will give patient 1 unit of PRBC.  -Last dosage of Xarelto yesterday evening.  Continue to hold off Xarelto     Nondisplaced RIGHT anterior acetabular rim fracture.  -ortho consult- WBAT, no surgery needed  AKI -Secondary to hypotension and dehydration -CT abdomen pelvis pending to rule out renal obstructions -CR trending down -Hold off BP meds including metoprolol, amlodipine, HCTZ and ACEI    Hyperkalemia -resolved   Lactic acidosis -Likely secondary to hypoperfusion from hypotension and bradycardia and acute blood loss anemia.  Patient has no cough no urinary symptoms no diarrhea to indicate active infection, monitor off antibiotics. -follow cultures   Acute metabolic encephalopathy -delirium precautions  -PRN haldol -have asked for sitter for now as I would like to avoid restraining patient    Near syncope -Likely from commendation of hypotension and bradycardia. -On most recent cardiology evaluation in April 2023, cardiology cut down patient's metoprolol to 50 mg daily and plan to further cut down patient's amlodipine as patient continues to lose weight.  Cardiology given and consider readjust Xarelto in the future. However, I called patient's pharmacy [facility] and patient still taking same dosage of metoprolol XL 100 mg daily and amlodipine 10 mg daily. Both will be on hold and likely will need to cut down dosages on discharge. Daughter aware. -echo done     Chronic A-fib -resume BB   Chronic urinary retention -Has been doing self catheterizing at home -foley  PROGRESS NOTE    Jacob Becker  [id number] DOB: [birth year] DOA: 12/28/2021 PCP: Kathalene Frames, MD    Brief Narrative:  Jacob Becker is a 88 y.o. male with medical history significant of advanced dementia, chronic A-fib on Xarelto, HTN, CAD, prostate cancer in remission on chronic hormone manipulation, hepatic and pancreatic cysts, presented with near syncope and fall.   Patient has advanced dementia and only thing he could remember was he started to feel lightheadedness since yesterday evening, and this morning, by accident, he locked himself inside the bedroom, and when family knocked the door from outside, patient did not realize and went to open the door, but lost his balance and fell on the right side of the chest.  No head injury, no LOC.  Daughter noticed the patient looked pale and clammy, and had slurred speech and confusion.  EMS arrived and found patient blood pressure in the lower 80s and bradycardia in the 50s.   According to patient's cardiologist note, patient has hypertension and A-fib but on the recent visit in April 2023, it was noticed the patient develop borderline bradycardia and borderline hypotension and cardiology recommended to cut down patient's metoprolol from 100 mg daily to 50 mg daily and also cut down amlodipine to 5 mg daily if necessary.  Daughter reported that the patient lives at home with wife and with her caregiver on site help two times a day.  For some reason patient did his medication recently including blood pressure, A-fib and Xarelto for at least 1 month.  And daughter found out the fact only last week and patient was restarted on all his medication 3 days ago.    Assessment and Plan: Right sided hemothorax, traumatic -Chest tube placed by trauma surgeon in the ED, repeat chest x-ray showed resolution of right-sided pneumothorax and chest tube in the upper mid chest -PT eval-- home health   Acute blood loss anemia -Secondary to rib  fracture and right-sided hemothorax -trend hgb -Given patient has acute anemia symptoms both hypotension and bradycardia, will give patient 1 unit of PRBC.  -Last dosage of Xarelto yesterday evening.  Continue to hold off Xarelto     Nondisplaced RIGHT anterior acetabular rim fracture.  -ortho consult- WBAT, no surgery needed  AKI -Secondary to hypotension and dehydration -CT abdomen pelvis pending to rule out renal obstructions -CR trending down -Hold off BP meds including metoprolol, amlodipine, HCTZ and ACEI    Hyperkalemia -resolved   Lactic acidosis -Likely secondary to hypoperfusion from hypotension and bradycardia and acute blood loss anemia.  Patient has no cough no urinary symptoms no diarrhea to indicate active infection, monitor off antibiotics. -follow cultures   Acute metabolic encephalopathy -delirium precautions  -PRN haldol -have asked for sitter for now as I would like to avoid restraining patient    Near syncope -Likely from commendation of hypotension and bradycardia. -On most recent cardiology evaluation in April 2023, cardiology cut down patient's metoprolol to 50 mg daily and plan to further cut down patient's amlodipine as patient continues to lose weight.  Cardiology given and consider readjust Xarelto in the future. However, I called patient's pharmacy [facility] and patient still taking same dosage of metoprolol XL 100 mg daily and amlodipine 10 mg daily. Both will be on hold and likely will need to cut down dosages on discharge. Daughter aware. -echo done     Chronic A-fib -resume BB   Chronic urinary retention -Has been doing self catheterizing at home -foley  PROGRESS NOTE    Jacob Becker  [id number] DOB: [birth year] DOA: 12/28/2021 PCP: Kathalene Frames, MD    Brief Narrative:  Jacob Becker is a 88 y.o. male with medical history significant of advanced dementia, chronic A-fib on Xarelto, HTN, CAD, prostate cancer in remission on chronic hormone manipulation, hepatic and pancreatic cysts, presented with near syncope and fall.   Patient has advanced dementia and only thing he could remember was he started to feel lightheadedness since yesterday evening, and this morning, by accident, he locked himself inside the bedroom, and when family knocked the door from outside, patient did not realize and went to open the door, but lost his balance and fell on the right side of the chest.  No head injury, no LOC.  Daughter noticed the patient looked pale and clammy, and had slurred speech and confusion.  EMS arrived and found patient blood pressure in the lower 80s and bradycardia in the 50s.   According to patient's cardiologist note, patient has hypertension and A-fib but on the recent visit in April 2023, it was noticed the patient develop borderline bradycardia and borderline hypotension and cardiology recommended to cut down patient's metoprolol from 100 mg daily to 50 mg daily and also cut down amlodipine to 5 mg daily if necessary.  Daughter reported that the patient lives at home with wife and with her caregiver on site help two times a day.  For some reason patient did his medication recently including blood pressure, A-fib and Xarelto for at least 1 month.  And daughter found out the fact only last week and patient was restarted on all his medication 3 days ago.    Assessment and Plan: Right sided hemothorax, traumatic -Chest tube placed by trauma surgeon in the ED, repeat chest x-ray showed resolution of right-sided pneumothorax and chest tube in the upper mid chest -PT eval-- home health   Acute blood loss anemia -Secondary to rib  fracture and right-sided hemothorax -trend hgb -Given patient has acute anemia symptoms both hypotension and bradycardia, will give patient 1 unit of PRBC.  -Last dosage of Xarelto yesterday evening.  Continue to hold off Xarelto     Nondisplaced RIGHT anterior acetabular rim fracture.  -ortho consult- WBAT, no surgery needed  AKI -Secondary to hypotension and dehydration -CT abdomen pelvis pending to rule out renal obstructions -CR trending down -Hold off BP meds including metoprolol, amlodipine, HCTZ and ACEI    Hyperkalemia -resolved   Lactic acidosis -Likely secondary to hypoperfusion from hypotension and bradycardia and acute blood loss anemia.  Patient has no cough no urinary symptoms no diarrhea to indicate active infection, monitor off antibiotics. -follow cultures   Acute metabolic encephalopathy -delirium precautions  -PRN haldol -have asked for sitter for now as I would like to avoid restraining patient    Near syncope -Likely from commendation of hypotension and bradycardia. -On most recent cardiology evaluation in April 2023, cardiology cut down patient's metoprolol to 50 mg daily and plan to further cut down patient's amlodipine as patient continues to lose weight.  Cardiology given and consider readjust Xarelto in the future. However, I called patient's pharmacy [facility] and patient still taking same dosage of metoprolol XL 100 mg daily and amlodipine 10 mg daily. Both will be on hold and likely will need to cut down dosages on discharge. Daughter aware. -echo done     Chronic A-fib -resume BB   Chronic urinary retention -Has been doing self catheterizing at home -foley  tube. RIGHT basilar atelectasis. Aortic Atherosclerosis (ICD10-I70.0). Electronically Signed   By: Ulyses Southward M.D.   On: 12/31/2021 08:22   DG CHEST PORT 1 VIEW  Result Date: 12/30/2021 CLINICAL DATA:  Status post fall on blood thinners. Chest tube in place. EXAM: PORTABLE CHEST 1 VIEW COMPARISON:  December 29, 2021 FINDINGS: The heart size and mediastinal contours are within normal limits. Pleural line is identified in the right apex suggesting minimal right apical pneumothorax. Right chest tube is unchanged. Minimal atelectasis of bilateral lung bases are noted. The visualized skeletal structures are stable. IMPRESSION: Pleural line is identified in the right apex suggesting minimal right apical pneumothorax. Right chest tube is unchanged. These results will be called to the ordering clinician or representative by the Radiologist Assistant, and communication documented in the PACS or [employer]. Electronically Signed   By: Sherian Rein M.D.   On: 12/30/2021 09:21   ECHOCARDIOGRAM COMPLETE  Result Date: 12/29/2021    ECHOCARDIOGRAM REPORT   Patient Name:   Jacob Becker Date of Exam: 12/29/2021 Medical Rec #:  [id number]        Height:       70.0 in Accession #:    [id number]       Weight:       150.0 lb Date of Birth:  Jan 13, 1930        BSA:          1.847 m Patient Age:    88 years         BP:           126/52 mmHg Patient Gender: M                HR:           71 bpm. Exam Location:  Inpatient Procedure: 2D Echo Indications:    syncope  History:        Patient has no prior history of Echocardiogram examinations.                 Arrythmias:Atrial Fibrillation.  Sonographer:    Delcie Roch RDCS Referring Phys: [id number] [facility]  Sonographer Comments: Image acquisition challenging due to respiratory motion. IMPRESSIONS  1. Left ventricular  ejection fraction, by estimation, is 60 to 65%. The left ventricle has normal function. The left ventricle has no regional wall motion abnormalities. There is moderate left ventricular hypertrophy of the basal-septal segment. Left ventricular diastolic parameters are indeterminate.  2. Right ventricular systolic function is normal. The right ventricular size is normal. There is normal pulmonary artery systolic pressure.  3. Left atrial size was moderately dilated.  4. Right atrial size was moderately dilated.  5. The mitral valve is normal in structure. No evidence of mitral valve regurgitation. No evidence of mitral stenosis.  6. The aortic valve is calcified. There is moderate calcification of the aortic valve. Aortic valve regurgitation is moderate. Aortic valve sclerosis is present, with no evidence of aortic valve stenosis. Aortic valve mean gradient measures 8.2 mmHg. Aortic valve Vmax measures 1.98 m/s.  7. The inferior vena cava is normal in size with greater than 50% respiratory variability, suggesting right atrial pressure of 3 mmHg. FINDINGS  Left Ventricle: Left ventricular ejection fraction, by estimation, is 60 to 65%. The left ventricle has normal function. The left ventricle has no regional wall motion abnormalities. The left ventricular internal cavity size was normal in size. There is  moderate left ventricular hypertrophy of the basal-septal segment. Left ventricular diastolic  PROGRESS NOTE    Jacob Becker  [id number] DOB: [birth year] DOA: 12/28/2021 PCP: Kathalene Frames, MD    Brief Narrative:  Jacob Becker is a 88 y.o. male with medical history significant of advanced dementia, chronic A-fib on Xarelto, HTN, CAD, prostate cancer in remission on chronic hormone manipulation, hepatic and pancreatic cysts, presented with near syncope and fall.   Patient has advanced dementia and only thing he could remember was he started to feel lightheadedness since yesterday evening, and this morning, by accident, he locked himself inside the bedroom, and when family knocked the door from outside, patient did not realize and went to open the door, but lost his balance and fell on the right side of the chest.  No head injury, no LOC.  Daughter noticed the patient looked pale and clammy, and had slurred speech and confusion.  EMS arrived and found patient blood pressure in the lower 80s and bradycardia in the 50s.   According to patient's cardiologist note, patient has hypertension and A-fib but on the recent visit in April 2023, it was noticed the patient develop borderline bradycardia and borderline hypotension and cardiology recommended to cut down patient's metoprolol from 100 mg daily to 50 mg daily and also cut down amlodipine to 5 mg daily if necessary.  Daughter reported that the patient lives at home with wife and with her caregiver on site help two times a day.  For some reason patient did his medication recently including blood pressure, A-fib and Xarelto for at least 1 month.  And daughter found out the fact only last week and patient was restarted on all his medication 3 days ago.    Assessment and Plan: Right sided hemothorax, traumatic -Chest tube placed by trauma surgeon in the ED, repeat chest x-ray showed resolution of right-sided pneumothorax and chest tube in the upper mid chest -PT eval-- home health   Acute blood loss anemia -Secondary to rib  fracture and right-sided hemothorax -trend hgb -Given patient has acute anemia symptoms both hypotension and bradycardia, will give patient 1 unit of PRBC.  -Last dosage of Xarelto yesterday evening.  Continue to hold off Xarelto     Nondisplaced RIGHT anterior acetabular rim fracture.  -ortho consult- WBAT, no surgery needed  AKI -Secondary to hypotension and dehydration -CT abdomen pelvis pending to rule out renal obstructions -CR trending down -Hold off BP meds including metoprolol, amlodipine, HCTZ and ACEI    Hyperkalemia -resolved   Lactic acidosis -Likely secondary to hypoperfusion from hypotension and bradycardia and acute blood loss anemia.  Patient has no cough no urinary symptoms no diarrhea to indicate active infection, monitor off antibiotics. -follow cultures   Acute metabolic encephalopathy -delirium precautions  -PRN haldol -have asked for sitter for now as I would like to avoid restraining patient    Near syncope -Likely from commendation of hypotension and bradycardia. -On most recent cardiology evaluation in April 2023, cardiology cut down patient's metoprolol to 50 mg daily and plan to further cut down patient's amlodipine as patient continues to lose weight.  Cardiology given and consider readjust Xarelto in the future. However, I called patient's pharmacy [facility] and patient still taking same dosage of metoprolol XL 100 mg daily and amlodipine 10 mg daily. Both will be on hold and likely will need to cut down dosages on discharge. Daughter aware. -echo done     Chronic A-fib -resume BB   Chronic urinary retention -Has been doing self catheterizing at home -foley

## 2021-12-31 NOTE — Progress Notes (Signed)
Pt very agitated and pulled out IV. Called daughter Dondra Spry at pt request and pt spoke with her, updated about pt condition. Safety sitter ordered, called staffing but none available at present. Dtr, Dondra Spry will call her husband or brother to come and stay with the pt.

## 2021-12-31 NOTE — Care Management Important Message (Signed)
Important Message  Patient Details  Name: Jacob Becker MRN: [id number] Date of Birth: 1929/10/28   Medicare Important Message Given:  Yes     Sherilyn Banker 12/31/2021, 1:14 PM

## 2021-12-31 NOTE — Plan of Care (Signed)
  Problem: Education: Goal: Knowledge of condition and prescribed therapy will improve 12/31/2021 0215 by Hortencia Pilar, RN Outcome: Progressing 12/31/2021 0141 by Hortencia Pilar, RN Outcome: Progressing   Problem: Cardiac: Goal: Will achieve and/or maintain adequate cardiac output 12/31/2021 0215 by Hortencia Pilar, RN Outcome: Progressing 12/31/2021 0141 by Hortencia Pilar, RN Outcome: Progressing   Problem: Physical Regulation: Goal: Complications related to the disease process, condition or treatment will be avoided or minimized 12/31/2021 0215 by Hortencia Pilar, RN Outcome: Progressing 12/31/2021 0141 by Hortencia Pilar, RN Outcome: Progressing   Problem: Safety: Goal: Non-violent Restraint(s) 12/31/2021 0215 by Hortencia Pilar, RN Outcome: Progressing 12/31/2021 0141 by Hortencia Pilar, RN Outcome: Progressing   Problem: Education: Goal: Knowledge of General Education information will improve Description: Including pain rating scale, medication(s)/side effects and non-pharmacologic comfort measures 12/31/2021 0215 by Hortencia Pilar, RN Outcome: Progressing 12/31/2021 0141 by Hortencia Pilar, RN Outcome: Progressing   Problem: Health Behavior/Discharge Planning: Goal: Ability to manage health-related needs will improve 12/31/2021 0215 by Hortencia Pilar, RN Outcome: Progressing 12/31/2021 0141 by Hortencia Pilar, RN Outcome: Progressing   Problem: Clinical Measurements: Goal: Ability to maintain clinical measurements within normal limits will improve 12/31/2021 0215 by Hortencia Pilar, RN Outcome: Progressing 12/31/2021 0141 by Hortencia Pilar, RN Outcome: Progressing Goal: Will remain free from infection 12/31/2021 0215 by Hortencia Pilar, RN Outcome: Progressing 12/31/2021 0141 by Hortencia Pilar, RN Outcome: Progressing Goal: Diagnostic test results will improve 12/31/2021 0215 by Hortencia Pilar, RN Outcome: Progressing 12/31/2021 0141 by Hortencia Pilar, RN Outcome: Progressing Goal: Respiratory complications will improve 12/31/2021 0215 by Hortencia Pilar, RN Outcome: Progressing 12/31/2021 0141 by Hortencia Pilar, RN Outcome: Progressing Goal: Cardiovascular complication will be avoided 12/31/2021 0215 by Hortencia Pilar, RN Outcome: Progressing 12/31/2021 0141 by Hortencia Pilar, RN Outcome: Progressing   Problem: Activity: Goal: Risk for activity intolerance will decrease 12/31/2021 0215 by Hortencia Pilar, RN Outcome: Progressing 12/31/2021 0141 by Hortencia Pilar, RN Outcome: Progressing   Problem: Nutrition: Goal: Adequate nutrition will be maintained 12/31/2021 0215 by Hortencia Pilar, RN Outcome: Progressing 12/31/2021 0141 by Hortencia Pilar, RN Outcome: Progressing   Problem: Coping: Goal: Level of anxiety will decrease 12/31/2021 0215 by Hortencia Pilar, RN Outcome: Progressing 12/31/2021 0141 by Hortencia Pilar, RN Outcome: Progressing   Problem: Elimination: Goal: Will not experience complications related to bowel motility 12/31/2021 0215 by Hortencia Pilar, RN Outcome: Progressing 12/31/2021 0141 by Hortencia Pilar, RN Outcome: Progressing Goal: Will not experience complications related to urinary retention 12/31/2021 0215 by Hortencia Pilar, RN Outcome: Progressing 12/31/2021 0141 by Hortencia Pilar, RN Outcome: Progressing   Problem: Pain Managment: Goal: General experience of comfort will improve 12/31/2021 0215 by Hortencia Pilar, RN Outcome: Progressing 12/31/2021 0141 by Hortencia Pilar, RN Outcome: Progressing   Problem: Safety: Goal: Ability to remain free from injury will improve 12/31/2021 0215 by Hortencia Pilar, RN Outcome: Progressing 12/31/2021 0141 by Hortencia Pilar, RN Outcome: Progressing   Problem: Skin Integrity: Goal: Risk for impaired skin integrity will decrease 12/31/2021 0215 by Hortencia Pilar, RN Outcome: Progressing 12/31/2021 0141 by Hortencia Pilar, RN Outcome: Progressing   Problem: Safety: Goal: Non-violent Restraint(s) 12/31/2021 0215 by Hortencia Pilar, RN Outcome: Progressing 12/31/2021 0141 by Hortencia Pilar, RN Outcome: Progressing

## 2022-01-01 ENCOUNTER — Inpatient Hospital Stay (HOSPITAL_COMMUNITY): Payer: Medicare Other

## 2022-01-01 DIAGNOSIS — J942 Hemothorax: Secondary | ICD-10-CM | POA: Diagnosis not present

## 2022-01-01 MED ORDER — CEFDINIR 300 MG PO CAPS
300.0000 mg | ORAL_CAPSULE | Freq: Two times a day (BID) | ORAL | Status: DC
  Administered 2022-01-01 – 2022-01-03 (×5): 300 mg via ORAL
  Filled 2022-01-01 (×5): qty 1

## 2022-01-01 MED ORDER — CHLORHEXIDINE GLUCONATE CLOTH 2 % EX PADS
6.0000 | MEDICATED_PAD | Freq: Every day | CUTANEOUS | Status: DC
  Administered 2022-01-01 – 2022-01-10 (×10): 6 via TOPICAL

## 2022-01-01 MED ORDER — RAMIPRIL 5 MG PO CAPS
10.0000 mg | ORAL_CAPSULE | Freq: Every day | ORAL | Status: DC
  Filled 2022-01-01: qty 2

## 2022-01-01 MED ORDER — AMLODIPINE BESYLATE 5 MG PO TABS
5.0000 mg | ORAL_TABLET | Freq: Every day | ORAL | Status: DC
  Administered 2022-01-01 – 2022-01-10 (×10): 5 mg via ORAL
  Filled 2022-01-01 (×10): qty 1

## 2022-01-01 NOTE — Progress Notes (Signed)
Occupational Therapy Treatment Patient Details Name: Jacob Becker MRN: [id number] DOB: 1929-12-15 Today's Date: 01/01/2022   History of present illness 88 y.o. male presents to [facility] hospital on 12/28/2021 after near syncope and a fall. Pt found to have R hemothorax, R rib 10-11 fx, R nondisplaced anterior wall acetabular fx. PMH includes dementia, afib, HTN, CAD, prostate CA.   OT comments  Pt in bed upon therapy arrival and agreeable to participate in OT treatment. Session focused on bed mobility, standing balance, functional transfers, and ADL re-training. Pt progressing towards therapy goals while demonstrating improved standing balance during functional transfer. Due to cognitive deficits, pt will continue to need VC for safety during ADL tasks. Next session would benefit from standing balance task while using RW. OT will continue to follow patient acutely.    Recommendations for follow up therapy are one component of a multi-disciplinary discharge planning process, led by the attending physician.  Recommendations may be updated based on patient status, additional functional criteria and insurance authorization.    Follow Up Recommendations  Home health OT     Assistance Recommended at Discharge Frequent or constant Supervision/Assistance  Patient can return home with the following  A little help with walking and/or transfers;A little help with bathing/dressing/bathroom;Help with stairs or ramp for entrance;Assistance with cooking/housework;Assist for transportation;Direct supervision/assist for medications management;Direct supervision/assist for financial management   Equipment Recommendations  None recommended by OT       Precautions / Restrictions Precautions Precautions: Fall Precaution Comments: chest tube Restrictions Weight Bearing Restrictions: No       Mobility Bed Mobility Overal bed mobility: Needs Assistance Bed Mobility: Supine to Sit     Supine to sit: Min  assist, HOB elevated     General bed mobility comments: Increased time to bring BLEs off EOB to sit. Max VC required for technique.    Transfers Overall transfer level: Needs assistance Equipment used: Rolling walker (2 wheels) Transfers: Sit to/from Stand, Bed to chair/wheelchair/BSC Sit to Stand: Min guard     Step pivot transfers: Min guard     General transfer comment: VC provided for hand placement on RW     Balance Overall balance assessment: Needs assistance Sitting-balance support: No upper extremity supported, Feet supported Sitting balance-Leahy Scale: Good     Standing balance support: Single extremity supported, Reliant on assistive device for balance Standing balance-Leahy Scale: Poor       ADL either performed or assessed with clinical judgement   ADL Overall ADL's : Needs assistance/impaired     Grooming: Set up;Sitting;Oral care;Wash/dry face                           Cognition Arousal/Alertness: Awake/alert Behavior During Therapy: WFL for tasks assessed/performed Overall Cognitive Status: History of cognitive impairments - at baseline       General Comments: Pt was fixated on not being able to find his car keys during session. Requested that his daughter, Jacob Becker be called to see if she'll be able to pick him up when it's time.               Pertinent Vitals/ Pain       Pain Assessment Pain Assessment: No/denies pain         Frequency  Min 2X/week        Progress Toward Goals  OT Goals(current goals can now be found in the care plan section)  Progress towards OT goals: Progressing toward goals  Plan Discharge plan remains appropriate;Frequency remains appropriate       AM-PAC OT "6 Clicks" Daily Activity     Outcome Measure   Help from another person eating meals?: None Help from another person taking care of personal grooming?: A Little Help from another person toileting, which includes using toliet, bedpan, or  urinal?: A Little Help from another person bathing (including washing, rinsing, drying)?: A Little Help from another person to put on and taking off regular upper body clothing?: A Little Help from another person to put on and taking off regular lower body clothing?: A Little 6 Click Score: 19    End of Session Equipment Utilized During Treatment: Gait belt;Rolling walker (2 wheels)  OT Visit Diagnosis: Other abnormalities of gait and mobility (R26.89);Muscle weakness (generalized) (M62.81);History of falling (Z91.81);Other symptoms and signs involving cognitive function   Activity Tolerance Patient tolerated treatment well   Patient Left in chair;with call bell/phone within reach;with chair alarm set   Nurse Communication Mobility status        Time: 6644-0347 OT Time Calculation (min): 17 min  Charges: OT General Charges $OT Visit: 1 Visit OT Treatments $Self Care/Home Management : 8-22 mins  Limmie Patricia, OTR/L,CBIS  Supplemental OT - [facility] and [facility]   Dequante Tremaine, Charisse March 01/01/2022, 4:58 PM

## 2022-01-01 NOTE — Progress Notes (Signed)
Physical Therapy Treatment Patient Details Name: Jacob Becker MRN: [id number] DOB: Sep 27, 1929 Today's Date: 01/01/2022   History of Present Illness 88 y.o. male presents to [facility] hospital on 12/28/2021 after near syncope and a fall. Pt found to have R hemothorax, R rib 10-11 fx, R nondisplaced anterior wall acetabular fx. PMH includes dementia, afib, HTN, CAD, prostate CA.    PT Comments    Pt received in supine, agreeable to therapy session, pt son/daughter present during session and encouraging. Emphasis on safety with transfers, gait progression with safety cues for rollator use and activity pacing. Pt with decreased recall for safe hand placement with transfers and difficulty controlling rollator speed/pacing activity so continue to recommend rolling walker (2 wheeled walker) for safety/stability however will continue to assess AD recommendation pending progress in next couple of days. Pt with continued CT output noted during mobility, RN/DO notified. Pt continues to benefit from PT services to progress toward functional mobility goals.   Recommendations for follow up therapy are one component of a multi-disciplinary discharge planning process, led by the attending physician.  Recommendations may be updated based on patient status, additional functional criteria and insurance authorization.  Follow Up Recommendations  Home health PT     Assistance Recommended at Discharge Intermittent Supervision/Assistance  Patient can return home with the following Help with stairs or ramp for entrance;A little help with walking and/or transfers;Assist for transportation;Direct supervision/assist for medications management;Direct supervision/assist for financial management;A little help with bathing/dressing/bathroom   Equipment Recommendations  Rolling walker (2 wheels)    Recommendations for Other Services       Precautions / Restrictions Precautions Precautions: Fall Precaution Comments: chest  tube Restrictions Weight Bearing Restrictions: No     Mobility  Bed Mobility Overal bed mobility: Needs Assistance Bed Mobility: Supine to Sit     Supine to sit: Min assist, HOB elevated     General bed mobility comments: increased time/effort to R EOB, assist with bed pad to advance hips and minA trunk support to raise to upright.    Transfers Overall transfer level: Needs assistance Equipment used: Rolling walker (2 wheels) Transfers: Sit to/from Stand Sit to Stand: Min guard           General transfer comment: from EOB and bench without arm rests to rollator and rollator>recliner. Pt needs cues to push from surfaces rather than pulling on AD and for use of brakes, pt having difficulty managing brakes on his own.    Ambulation/Gait Ambulation/Gait assistance: Min assist Gait Distance (Feet): 150 Feet (x2 with seated break) Assistive device: Rollator (4 wheels) Gait Pattern/deviations: Step-through pattern, Drifts right/left, Narrow base of support, Trunk flexed Gait velocity: functional     General Gait Details: pt at times with poorly controlled speed, needs minA for rollator management and mod postural cues. Pt encouraged to take seated break halfway through due to decreased stability, VSS on RA and only ~1/4 DOE. Discussed safe use of rollator vs RW with pt and family, pt family receptive, may be safer to continue using RW for stability and pt having difficulty recalling to use brakes     Balance Overall balance assessment: Needs assistance Sitting-balance support: No upper extremity supported, Feet supported Sitting balance-Leahy Scale: Good     Standing balance support: Single extremity supported, Reliant on assistive device for balance Standing balance-Leahy Scale: Poor Standing balance comment: reliant on AD  Cognition Arousal/Alertness: Awake/alert Behavior During Therapy: WFL for tasks assessed/performed Overall  Cognitive Status: History of cognitive impairments - at baseline                                 General Comments: dementia at baseline, tangential thought at times. poor memory although some recall of safe hand placement within session, pt needs reminder for use of brakes with rollator        Exercises Other Exercises Other Exercises: supine BLE AROM: ankle pumps, hip abduction, heel slides x5-10 reps ea Other Exercises: IS x 10 reps (~1500)    General Comments General comments (skin integrity, edema, etc.): SpO2 and HR WFL on RA with exertion; BP WFL prior to OOB and no dizziness reported.      Pertinent Vitals/Pain Pain Assessment Pain Assessment: PAINAD Breathing: normal Negative Vocalization: none Facial Expression: smiling or inexpressive Body Language: relaxed Consolability: no need to console PAINAD Score: 0 Pain Intervention(s): Monitored during session, Repositioned           PT Goals (current goals can now be found in the care plan section) Acute Rehab PT Goals Patient Stated Goal: to return home PT Goal Formulation: With patient/family Time For Goal Achievement: 01/13/22 Progress towards PT goals: Progressing toward goals    Frequency    Min 3X/week      PT Plan Current plan remains appropriate       AM-PAC PT "6 Clicks" Mobility   Outcome Measure  Help needed turning from your back to your side while in a flat bed without using bedrails?: A Little Help needed moving from lying on your back to sitting on the side of a flat bed without using bedrails?: A Little Help needed moving to and from a bed to a chair (including a wheelchair)?: A Little Help needed standing up from a chair using your arms (e.g., wheelchair or bedside chair)?: A Little Help needed to walk in hospital room?: A Lot (mod safety cues) Help needed climbing 3-5 steps with a railing? : A Lot (anticipate mod safety cues) 6 Click Score: 16    End of Session Equipment  Utilized During Treatment: Gait belt Activity Tolerance: Patient tolerated treatment well Patient left: in chair;with call bell/phone within reach;with chair alarm set;with family/visitor present;Other (comment) (son and dtr present in room) Nurse Communication: Mobility status;Other (comment) (CT still with a lot of output while pt mobilizing) PT Visit Diagnosis: Other abnormalities of gait and mobility (R26.89)     Time: 6578-4696 PT Time Calculation (min) (ACUTE ONLY): 31 min  Charges:  $Gait Training: 8-22 mins $Therapeutic Activity: 8-22 mins                     Lynford Espinoza P., PTA Acute Rehabilitation Services Secure Chat Preferred 9a-5:30pm Office: [phone]    Dorathy Kinsman [facility] 01/01/2022, 2:30 PM

## 2022-01-01 NOTE — Progress Notes (Signed)
[facility] Progress Note     Subjective: CC-  Alert. Denies chest pain. Oriented to person, [place], [place]. Reports his age as 88 years old.   Objective: Vital signs in last 24 hours: Temp:  [97.9 F (36.6 C)-99.3 F (37.4 C)] 99.3 F (37.4 C) (12/05 0732) Pulse Rate:  [68-77] 77 (12/05 0732) Resp:  [15-18] 16 (12/05 0732) BP: (116-185)/(57-87) 185/87 (12/05 0732) SpO2:  [97 %-99 %] 97 % (12/05 0732) Last BM Date :  (unknown)  Intake/Output from previous day: 12/04 0701 - 12/05 0700 In: 417 [P.O.:417] Out: 1500 [Urine:550; Chest Tube:950] Intake/Output this shift: Total I/O In: 240 [P.O.:240] Out: 350 [Urine:350]  PE: Gen:  Alert, NAD Card:  normal heart rate Pulm:  CTAB, no W/R/R, rate and effort normal on room air. Right chest tube without air leak. Pulling 750 on IS Abd: Soft, NT/ND Ext:  no BUE/BLE edema, calves soft and nontender. Left elbow lac with sutures intact, mild erythema and some fluctuance- feels like a small effusion over the elbow. able to actively range the elbow without pain Psych: Alert, oriented to self only  Skin: no rashes noted, warm and dry  Lab Results:  Recent Labs    12/30/21 0149 12/31/21 0326  WBC 9.3 11.0*  HGB 8.1* 8.2*  HCT 24.8* 25.1*  PLT 134* 140*   BMET Recent Labs    12/30/21 1031 12/31/21 0326  NA 138 134*  K 3.6 3.9  CL 111 105  CO2 20* 22  GLUCOSE 139* 136*  BUN 40* 45*  CREATININE 1.34* 1.28*  CALCIUM 8.1* 7.7*   PT/INR No results for input(s): "LABPROT", "INR" in the last 72 hours.  CMP     Component Value Date/Time   NA 134 (L) 12/31/2021 0326   K 3.9 12/31/2021 0326   CL 105 12/31/2021 0326   CO2 22 12/31/2021 0326   GLUCOSE 136 (H) 12/31/2021 0326   BUN 45 (H) 12/31/2021 0326   CREATININE 1.28 (H) 12/31/2021 0326   CALCIUM 7.7 (L) 12/31/2021 0326   PROT 5.4 (L) 12/28/2021 1043   ALBUMIN 2.5 (L) 12/28/2021 1043   AST 32 12/28/2021 1043   ALT 17 12/28/2021 1043   ALKPHOS  126 12/28/2021 1043   BILITOT 0.6 12/28/2021 1043   GFRNONAA 53 (L) 12/31/2021 0326   Lipase  No results found for: "LIPASE"     Studies/Results: DG CHEST PORT 1 VIEW  Result Date: 01/01/2022 CLINICAL DATA:  Follow up pneumothorax. EXAM: PORTABLE CHEST 1 VIEW COMPARISON:  Radiographs 12/31/2021 and 12/30/2021.  CT 12/28/2021. FINDINGS: 0546 hours. The heart size and mediastinal contours are stable with aortic atherosclerosis. Right chest tube is unchanged in position. Minimal right apical pneumothorax is unchanged. There is improved aeration of the right lung base. No mediastinal shift or significant pleural effusion. The bones appear unchanged. IMPRESSION: Unchanged minimal right apical pneumothorax. Improved aeration of the right lung base. Electronically Signed   By: Carey Bullocks M.D.   On: 01/01/2022 08:49   DG CHEST PORT 1 VIEW  Result Date: 12/31/2021 CLINICAL DATA:  Chest tube EXAM: PORTABLE CHEST 1 VIEW COMPARISON:  Portable exam 0611 hours compared to 12/30/2021 FINDINGS: RIGHT thoracostomy tube stable. Upper normal heart size. Mediastinal contours and pulmonary vascularity normal. Atherosclerotic calcification aorta. RIGHT basilar atelectasis again seen with persistent small RIGHT apex pneumothorax. LEFT lung clear. Bones demineralized. IMPRESSION: Persistent small RIGHT apex pneumothorax despite thoracostomy tube. RIGHT basilar atelectasis. Aortic Atherosclerosis (ICD10-I70.0). Electronically Signed   By: Ulyses Southward  M.D.   On: 12/31/2021 08:22    Anti-infectives: Anti-infectives (From admission, onward)    Start     Dose/Rate Route Frequency Ordered Stop   12/28/21 1215  cefTRIAXone (ROCEPHIN) 1 g in sodium chloride 0.9 % 100 mL IVPB  Status:  Discontinued        1 g 200 mL/hr over 30 Minutes Intravenous  Once 12/28/21 1205 12/28/21 1320   12/28/21 1215  azithromycin (ZITHROMAX) 500 mg in sodium chloride 0.9 % 250 mL IVPB  Status:  Discontinued        500 mg 250 mL/hr over  60 Minutes Intravenous  Once 12/28/21 1205 12/28/21 1320        Assessment/Plan Fall on xarelto R hemothorax - s/p chest tube placement 12/2. Small Apical PNX on CXR this morning. Place to [facility] and repeat CXR at 1400. Encourage IS. CT output unclear - staff documented 950 mL in epic but on the sahara it looks like only 200-250 mL has come out since 12/3. Seems low output at this point.   R rib fractures 10-11 - multimodal pain control and pulm toilet ABL anemia - s/p 1u PRBCs 12/1. Hgb 8.2 from 8.1, stable Right anterior acetabular rib fx - per ortho, Dr. Mable Fill, nonop, WBAT RLE Chronic A fib on xarelto - would consider holding anticoagulation indefinitely  Chronic urinary retention - I&O caths at home. Foley placed by urology 12/2 Prostate cancer HTN HLD   ID - none currently FEN - IVF per [facility], HH/CM diet VTE - SCDs, ok for chemical dvt ppx from surgical standpoint Foley - placed by urology 12/2   Dispo - Chest tube as above. PT/OT.    I reviewed hospitalist notes, last 24 h vitals and pain scores, last 48 h intake and output, last 24 h labs and trends, and last 24 h imaging results.    LOS: 4 days    [facility] 01/01/2022, 10:29 AM Please see Amion for pager number during day hours 7:00am-4:30pm

## 2022-01-01 NOTE — Progress Notes (Addendum)
PROGRESS NOTE    Jacob Becker  GNF:[id number] DOB: 1929-09-14 DOA: 12/28/2021 PCP: Jacob Aspen, MD    Brief Narrative:  Jacob Becker is a 88 y.o. male with medical history significant of advanced dementia, chronic A-fib on Xarelto, HTN, CAD, prostate cancer in remission on chronic hormone manipulation, hepatic and pancreatic cysts, presented with near syncope and fall.  s/p rib fractures and right hemothorax-- chest tube placed.  Slow to improve- plan is home with family and 24/7 supervision when able.   Assessment and Plan: Right sided hemothorax, traumatic -Chest tube placed by trauma surgeon in the ED -daily x ray and management per GS -PT eval-- home health   Acute blood loss anemia -Secondary to rib fracture and right-sided hemothorax -trend hgb -Given patient has acute anemia symptoms both hypotension and bradycardia, will give patient 1 unit of PRBC. -Continue to hold off Xarelto until ok with GS  Nondisplaced RIGHT anterior acetabular rim fracture.  -ortho consult- WBAT, no surgery needed  AKI -Secondary to hypotension and dehydration -CT abdomen pelvis pending to rule out renal obstructions -CR trending down -labs in AM  HTN -resume some home meds at lower dose-- leave others off for now as BP tends to swing wildly   Hyperkalemia -resolved   Lactic acidosis -Likely secondary to hypoperfusion from hypotension and bradycardia and acute blood loss anemia.  Patient has no cough no urinary symptoms no diarrhea to indicate active infection, monitor off antibiotics.   Acute metabolic encephalopathy -delirium precautions  -PRN haldol -have asked for sitter for now as I would like to avoid restraining patient    Near syncope -Likely from commendation of hypotension and bradycardia. -On most recent cardiology evaluation in April 2023, cardiology cut down patient's metoprolol to 50 mg daily and plan to further cut down patient's amlodipine as patient  continues to lose weight.   - unfortunately, patient still taking same dosage of metoprolol XL 100 mg daily and amlodipine 10 mg daily. Resume at lower dose -echo done   Chronic A-fib -resume BB -xarelto on hold for now   Chronic urinary retention -Has been doing self catheterizing at home -foley placed by urology-- will leave in until patient able to do own self caths  Prostate CA -In Remission, continue home hormone therapy medication   Liver cyst, pancreatic cyst -Stable on CT scan   Moderate protein calorie malnutrition with unintentional weight loss -about 15 lbs weight loss in last 6 months for poor appetite and food intake, for which patient has been on Ensure twice daily.  Urine culture came back with e coli-- pan sensitive -will add abx x 5-7 days as unclear if this could be contributing to his decline in mental status    DVT prophylaxis: heparin injection 5,000 Units Start: 12/30/21 1400 SCDs Start: 12/28/21 1442    Code Status: Full Code Family Communication: daughter/son at bedside  Disposition Plan:  Level of care: Telemetry Medical Status is: Inpatient Remains inpatient appropriate because: chest tube-- may be removed on Wednesday- plan is home with 24/7 caregiver after    Consultants:  trauma   Subjective: Not currently agitated   Objective: Vitals:   12/31/21 1856 12/31/21 1900 01/01/22 0510 01/01/22 0732  BP: (!) 127/57 124/67 127/76 (!) 185/87  Pulse: 68 76 77 77  Resp: 16 15 18 16   Temp: 98.1 F (36.7 C) 98.2 F (36.8 C) 97.9 F (36.6 C) 99.3 F (37.4 C)  TempSrc: Oral Oral Oral Oral  SpO2: 99% 98%  97%  Weight:      Height:        Intake/Output Summary (Last 24 hours) at 01/01/2022 1102 Last data filed at 01/01/2022 0930 Gross per 24 hour  Intake 657 ml  Output 1850 ml  Net -1193 ml   Filed Weights   12/28/21 1032 12/30/21 0500 12/31/21 0615  Weight: 68 kg 68.2 kg 73.9 kg    Examination:     General: Appearance:     elderly male in no acute distress     Lungs:      respirations unlabored  Heart:    Normal heart rate.   MS:   All extremities are intact.   Neurologic:   Awake, alert-- oriented to person and place but not situation           Data Reviewed: I have personally reviewed following labs and imaging studies  CBC: Recent Labs  Lab 12/28/21 1043 12/28/21 1107 12/28/21 1529 12/28/21 2251 12/29/21 0222 12/30/21 0149 12/31/21 0326  WBC 12.2*  --   --   --  10.6* 9.3 11.0*  HGB 8.2*   < > 8.6* 8.6* 8.4* 8.1* 8.2*  HCT 25.5*   < > 25.7* 24.9* 25.7* 24.8* 25.1*  MCV 100.8*  --   --   --  94.8 95.0 95.8  PLT 159  --   --   --  129* 134* 140*   < > = values in this interval not displayed.   Basic Metabolic Panel: Recent Labs  Lab 12/28/21 1529 12/29/21 0222 12/30/21 0149 12/30/21 1031 12/31/21 0326  NA 140 138 139 138 134*  K 5.0 3.7 3.4* 3.6 3.9  CL 104 109 111 111 105  CO2 21* 21* 22 20* 22  GLUCOSE 158* 136* 137* 139* 136*  BUN 48* 53* 45* 40* 45*  CREATININE 1.83* 1.70* 1.36* 1.34* 1.28*  CALCIUM 9.1 7.9* 7.8* 8.1* 7.7*   GFR: Estimated Creatinine Clearance: 39.6 mL/min (A) (by C-G formula based on SCr of 1.28 mg/dL (H)). Liver Function Tests: Recent Labs  Lab 12/28/21 1043  AST 32  ALT 17  ALKPHOS 126  BILITOT 0.6  PROT 5.4*  ALBUMIN 2.5*   No results for input(s): "LIPASE", "AMYLASE" in the last 168 hours. No results for input(s): "AMMONIA" in the last 168 hours. Coagulation Profile: Recent Labs  Lab 12/28/21 1043  INR 1.9*   Cardiac Enzymes: No results for input(s): "CKTOTAL", "CKMB", "CKMBINDEX", "TROPONINI" in the last 168 hours. BNP (last 3 results) No results for input(s): "PROBNP" in the last 8760 hours. HbA1C: No results for input(s): "HGBA1C" in the last 72 hours. CBG: Recent Labs  Lab 12/29/21 0510 12/30/21 0500  GLUCAP 144* 122*   Lipid Profile: No results for input(s): "CHOL", "HDL", "LDLCALC", "TRIG", "CHOLHDL", "LDLDIRECT" in the  last 72 hours. Thyroid Function Tests: No results for input(s): "TSH", "T4TOTAL", "FREET4", "T3FREE", "THYROIDAB" in the last 72 hours. Anemia Panel: No results for input(s): "VITAMINB12", "FOLATE", "FERRITIN", "TIBC", "IRON", "RETICCTPCT" in the last 72 hours. Sepsis Labs: Recent Labs  Lab 12/28/21 1043 12/31/21 0326  LATICACIDVEN 4.9* 1.4    Recent Results (from the past 240 hour(s))  Culture, blood (routine x 2)     Status: None (Preliminary result)   Collection Time: 12/28/21 10:29 AM   Specimen: BLOOD RIGHT HAND  Result Value Ref Range Status   Specimen Description BLOOD RIGHT HAND  Final   Special Requests   Final    BOTTLES DRAWN AEROBIC AND ANAEROBIC Blood Culture results may  not be optimal due to an inadequate volume of blood received in culture bottles   Culture   Final    NO GROWTH 4 DAYS Performed at [facility], [address], [place], [place] [address]    Report Status PENDING  Incomplete  Culture, blood (routine x 2)     Status: None (Preliminary result)   Collection Time: 12/28/21 10:34 AM   Specimen: BLOOD RIGHT ARM  Result Value Ref Range Status   Specimen Description BLOOD RIGHT ARM  Final   Special Requests   Final    BOTTLES DRAWN AEROBIC AND ANAEROBIC Blood Culture adequate volume   Culture   Final    NO GROWTH 4 DAYS Performed at [facility], [address] [place], [place] [address]    Report Status PENDING  Incomplete  Urine Culture     Status: Abnormal   Collection Time: 12/29/21  7:38 AM   Specimen: Urine, Catheterized  Result Value Ref Range Status   Specimen Description URINE, CATHETERIZED  Final   Special Requests   Final    NONE Performed at [facility], [address], [place], [place] [address]    Culture >=100,000 COLONIES/mL ESCHERICHIA COLI (A)  Final   Report Status 12/31/2021 FINAL  Final   Organism ID, Bacteria ESCHERICHIA COLI (A)  Final      Susceptibility   Escherichia coli - MIC*    AMPICILLIN 4  SENSITIVE Sensitive     CEFAZOLIN <=4 SENSITIVE Sensitive     CEFEPIME <=0.12 SENSITIVE Sensitive     CEFTRIAXONE <=0.25 SENSITIVE Sensitive     CIPROFLOXACIN <=0.25 SENSITIVE Sensitive     GENTAMICIN <=1 SENSITIVE Sensitive     IMIPENEM <=0.25 SENSITIVE Sensitive     NITROFURANTOIN <=16 SENSITIVE Sensitive     TRIMETH/SULFA <=20 SENSITIVE Sensitive     AMPICILLIN/SULBACTAM <=2 SENSITIVE Sensitive     PIP/TAZO <=4 SENSITIVE Sensitive     * >=100,000 COLONIES/mL ESCHERICHIA COLI         Radiology Studies: DG CHEST PORT 1 VIEW  Result Date: 01/01/2022 CLINICAL DATA:  Follow up pneumothorax. EXAM: PORTABLE CHEST 1 VIEW COMPARISON:  Radiographs 12/31/2021 and 12/30/2021.  CT 12/28/2021. FINDINGS: 0546 hours. The heart size and mediastinal contours are stable with aortic atherosclerosis. Right chest tube is unchanged in position. Minimal right apical pneumothorax is unchanged. There is improved aeration of the right lung base. No mediastinal shift or significant pleural effusion. The bones appear unchanged. IMPRESSION: Unchanged minimal right apical pneumothorax. Improved aeration of the right lung base. Electronically Signed   By: Carey Bullocks M.D.   On: 01/01/2022 08:49   DG CHEST PORT 1 VIEW  Result Date: 12/31/2021 CLINICAL DATA:  Chest tube EXAM: PORTABLE CHEST 1 VIEW COMPARISON:  Portable exam 0611 hours compared to 12/30/2021 FINDINGS: RIGHT thoracostomy tube stable. Upper normal heart size. Mediastinal contours and pulmonary vascularity normal. Atherosclerotic calcification aorta. RIGHT basilar atelectasis again seen with persistent small RIGHT apex pneumothorax. LEFT lung clear. Bones demineralized. IMPRESSION: Persistent small RIGHT apex pneumothorax despite thoracostomy tube. RIGHT basilar atelectasis. Aortic Atherosclerosis (ICD10-I70.0). Electronically Signed   By: Ulyses Southward M.D.   On: 12/31/2021 08:22        Scheduled Meds:  acetaminophen  1,000 mg Oral TID    colesevelam  1,250 mg Oral BID   docusate sodium  100 mg Oral BID   enzalutamide  160 mg Oral QHS   feeding supplement  237 mL Oral BID BM  heparin injection (subcutaneous)  5,000 Units Subcutaneous Q8H   lidocaine  1 patch Transdermal Q24H   metoprolol succinate  50 mg Oral Daily   rosuvastatin  40 mg Oral Daily   sodium chloride flush  3 mL Intravenous Q12H   Continuous Infusions:     LOS: 4 days    Time spent: 45 minutes spent on chart review, discussion with nursing staff, consultants, updating family and interview/physical exam; more than 50% of that time was spent in counseling and/or coordination of care.    Joseph Art, DO [facility] Available via Epic secure chat 7am-7pm After these hours, please refer to coverage provider listed on [email or web address] 01/01/2022, 11:02 AM

## 2022-01-02 ENCOUNTER — Inpatient Hospital Stay (HOSPITAL_COMMUNITY): Payer: Medicare Other

## 2022-01-02 DIAGNOSIS — N179 Acute kidney failure, unspecified: Secondary | ICD-10-CM | POA: Diagnosis not present

## 2022-01-02 DIAGNOSIS — R55 Syncope and collapse: Secondary | ICD-10-CM

## 2022-01-02 DIAGNOSIS — J942 Hemothorax: Secondary | ICD-10-CM | POA: Diagnosis not present

## 2022-01-02 DIAGNOSIS — I4811 Longstanding persistent atrial fibrillation: Secondary | ICD-10-CM

## 2022-01-02 LAB — CBC
HCT: 26 % — ABNORMAL LOW (ref 39.0–52.0)
Hemoglobin: 8.8 g/dL — ABNORMAL LOW (ref 13.0–17.0)
MCH: 32.1 pg (ref 26.0–34.0)
MCHC: 33.8 g/dL (ref 30.0–36.0)
MCV: 94.9 fL (ref 80.0–100.0)
Platelets: 175 10*3/uL (ref 150–400)
RBC: 2.74 MIL/uL — ABNORMAL LOW (ref 4.22–5.81)
RDW: 14.7 % (ref 11.5–15.5)
WBC: 10.1 10*3/uL (ref 4.0–10.5)
nRBC: 0 % (ref 0.0–0.2)

## 2022-01-02 LAB — BASIC METABOLIC PANEL
Anion gap: 7 (ref 5–15)
BUN: 36 mg/dL — ABNORMAL HIGH (ref 8–23)
CO2: 21 mmol/L — ABNORMAL LOW (ref 22–32)
Calcium: 8.3 mg/dL — ABNORMAL LOW (ref 8.9–10.3)
Chloride: 105 mmol/L (ref 98–111)
Creatinine, Ser: 1.23 mg/dL (ref 0.61–1.24)
GFR, Estimated: 56 mL/min — ABNORMAL LOW (ref >60)
Glucose, Bld: 128 mg/dL — ABNORMAL HIGH (ref 70–99)
Potassium: 3.9 mmol/L (ref 3.5–5.1)
Sodium: 133 mmol/L — ABNORMAL LOW (ref 135–145)

## 2022-01-02 LAB — GLUCOSE, CAPILLARY: Glucose-Capillary: 123 mg/dL — ABNORMAL HIGH (ref 70–99)

## 2022-01-02 LAB — CULTURE, BLOOD (ROUTINE X 2)
Culture: NO GROWTH
Culture: NO GROWTH
Special Requests: ADEQUATE

## 2022-01-02 NOTE — Progress Notes (Signed)
Trauma/Critical Care Follow Up Note  Subjective:    Overnight Issues:   Objective:  Vital signs for last 24 hours: Temp:  [97.6 F (36.4 C)-98.4 F (36.9 C)] 98.4 F (36.9 C) (12/06 0513) Pulse Rate:  [73-77] 73 (12/06 0513) Resp:  [18] 18 (12/06 0513) BP: (136-139)/(70-74) 136/70 (12/06 0513) SpO2:  [99 %] 99 % (12/06 0513)  Hemodynamic parameters for last 24 hours:    Intake/Output from previous day: 12/05 0701 - 12/06 0700 In: 300 [P.O.:300] Out: 826 [Urine:825; Stool:1]  Intake/Output this shift: Total I/O In: 220 [P.O.:220] Out: -   Vent settings for last 24 hours:    Physical Exam:  Gen: comfortable, no distress Neuro: non-focal exam HEENT: PERRL Neck: supple CV: RRR Pulm: unlabored breathing Abd: soft, NT GU: clear yellow urine Extr: wwp, no edema   Results for orders placed or performed during the hospital encounter of 12/28/21 (from the past 24 hour(s))  CBC     Status: Abnormal   Collection Time: 01/02/22  2:36 AM  Result Value Ref Range   WBC 10.1 4.0 - 10.5 K/uL   RBC 2.74 (L) 4.22 - 5.81 MIL/uL   Hemoglobin 8.8 (L) 13.0 - 17.0 g/dL   HCT 26.0 (L) 39.0 - 52.0 %   MCV 94.9 80.0 - 100.0 fL   MCH 32.1 26.0 - 34.0 pg   MCHC 33.8 30.0 - 36.0 g/dL   RDW 14.7 11.5 - 15.5 %   Platelets 175 150 - 400 K/uL   nRBC 0.0 0.0 - 0.2 %  Basic metabolic panel     Status: Abnormal   Collection Time: 01/02/22  2:36 AM  Result Value Ref Range   Sodium 133 (L) 135 - 145 mmol/L   Potassium 3.9 3.5 - 5.1 mmol/L   Chloride 105 98 - 111 mmol/L   CO2 21 (L) 22 - 32 mmol/L   Glucose, Bld 128 (H) 70 - 99 mg/dL   BUN 36 (H) 8 - 23 mg/dL   Creatinine, Ser 1.23 0.61 - 1.24 mg/dL   Calcium 8.3 (L) 8.9 - 10.3 mg/dL   GFR, Estimated 56 (L) >60 mL/min   Anion gap 7 5 - 15  Glucose, capillary     Status: Abnormal   Collection Time: 01/02/22  4:40 AM  Result Value Ref Range   Glucose-Capillary 123 (H) 70 - 99 mg/dL    Assessment & Plan:   Present on  Admission: **None**    LOS: 5 days   Additional comments:I reviewed the patient's new clinical lab test results.   and I reviewed the patients new imaging test results.    Fall on xarelto  R hemothorax - s/p chest tube placement 12/2. No PNX on CXR this morning. CT to [facility]. Encourage IS. CT output unclear - no staff documented in the last 24h. Seems low output at this point.    R rib fractures 10-11 - multimodal pain control and pulm toilet ABL anemia - s/p 1u PRBCs 12/1. Hgb 8.2 from 8.1, stable Right anterior acetabular rib fx - per ortho, Dr. Mable Fill, nonop, WBAT RLE Chronic A fib on xarelto - would consider holding anticoagulation indefinitely  Chronic urinary retention - I&O caths at home. Foley placed by urology 12/2. Patient requesting removal this AM Prostate cancer HTN HLD   ID - none currently FEN - IVF per TRH, HH/CM diet VTE - SCDs, ok for chemical dvt ppx from surgical standpoint Foley - placed by urology 12/2   Dispo - Chest tube  as above. PT/OT.   Diamantina Monks, MD Trauma & General Surgery Please use [email or web address] to contact on call provider  01/02/2022  *Care during the described time interval was provided by me. I have reviewed this patient's available data, including medical history, events of note, physical examination and test results as part of my evaluation.

## 2022-01-02 NOTE — Progress Notes (Signed)
Chest tube output on dayshift 01/02/22 was 60mL

## 2022-01-02 NOTE — Progress Notes (Signed)
Per request, I came to assist [facility] in changing chest tube collection device. New atrium drainage connected. Ben notified that current order states -20 cm suction, which was not in place when I changed the drainage system. Will also notify primary RN Jasmin (who is currently off floor) via secure chat.  Verneda Skill RN

## 2022-01-02 NOTE — Progress Notes (Addendum)
Patient is trying to pull out tubes an drains, 2 points restrain order in place

## 2022-01-02 NOTE — Progress Notes (Signed)
Physical Therapy Treatment Patient Details Name: Jacob Becker MRN: [id number] DOB: [birth year] Today's Date: 01/02/2022   History of Present Illness 88 y.o. male presents to [facility] hospital on 12/28/2021 after near syncope and a fall. Pt found to have R hemothorax, R rib 10-11 fx, R nondisplaced anterior wall acetabular fx. PMH includes dementia, afib, HTN, CAD, prostate CA.    PT Comments    Pt received in supine, agreeable to therapy session with encouragement, pt received with wrist restraints on but following simple commands with multimodal cues and needing min to modA for bed mobility, transfers and short distance gait trial in the room. Pt with very small BM and needed close monitoring while on toilet due to cognitive deficit. Pt fatigues more quickly today, deferring hallway distance gait trial but agreeable to sit up in recliner. NT sitter notified pt more calm and not pulling on cords, kept restraints off but pt will need to be monitored closely given his chest tube and fall risk in case they need to be replaced. Pt daughter called and updated on his progress per his request. Pt continues to benefit from PT services to progress toward functional mobility goals.    Recommendations for follow up therapy are one component of a multi-disciplinary discharge planning process, led by the attending physician.  Recommendations may be updated based on patient status, additional functional criteria and insurance authorization.  Follow Up Recommendations  Home health PT (vs ST SNF if 24/7 assist unavailable)     Assistance Recommended at Discharge Intermittent Supervision/Assistance  Patient can return home with the following Help with stairs or ramp for entrance;A little help with walking and/or transfers;Assist for transportation;Direct supervision/assist for medications management;Direct supervision/assist for financial management;A little help with bathing/dressing/bathroom   Equipment  Recommendations  Rolling walker (2 wheels)    Recommendations for Other Services       Precautions / Restrictions Precautions Precautions: Fall Precaution Comments: chest tube, dementia related confusion so sitter present today Restrictions Weight Bearing Restrictions: No     Mobility  Bed Mobility Overal bed mobility: Needs Assistance Bed Mobility: Supine to Sit     Supine to sit: HOB elevated, Mod assist     General bed mobility comments: increased time/effort to R EOB, assist with bed pad to advance hips with up to modA due to pt cognitive deficit and difficulty sequencing simple movement cues today.    Transfers Overall transfer level: Needs assistance Equipment used: Rolling walker (2 wheels) Transfers: Sit to/from Stand Sit to Stand: Min assist           General transfer comment: from EOB to/from toilet seat, mod cues for safe UE placement with poor carryover so pt needing hand over hand assist to push from the bed and wall rail when in bathroom. Pt not following trial and error cueing well needed errorless learning instruction.    Ambulation/Gait Ambulation/Gait assistance: Min assist Gait Distance (Feet): 20 Feet (x2 with seated break ~5 mins on toilet) Assistive device: Rolling walker (2 wheels) Gait Pattern/deviations: Step-through pattern, Drifts right/left, Narrow base of support, Trunk flexed Gait velocity: decreased     General Gait Details: pt with bowel urgency so distance limited due to this and pt c/o fatigue after toileting, only agreeable to short distance gait trial in the room.   Stairs             Wheelchair Mobility    Modified Rankin (Stroke Patients Only)       Balance Overall balance assessment:  Needs assistance Sitting-balance support: No upper extremity supported, Feet supported Sitting balance-Leahy Scale: Good     Standing balance support: Reliant on assistive device for balance, Bilateral upper extremity  supported Standing balance-Leahy Scale: Poor Standing balance comment: reliant on AD                            Cognition Arousal/Alertness: Awake/alert Behavior During Therapy: WFL for tasks assessed/performed Overall Cognitive Status: History of cognitive impairments - at baseline                                 General Comments: Baseline dementia. Pt more confused today than previous date and needs frequent redirection to task. He had restraints on B wrists when PTA arrived to room however once up in chair, he was not pulling on lines and was resting in recliner watching TV, sitter present in room, so per RN OK to keep restraints off at that time. Pt daughter not present however called at end of session per his request and she states she will return after work.        Exercises Other Exercises Other Exercises: seated BLE AROM: LAQ with tactile/verbal cues x10 reps ea Other Exercises: IS x 10 reps (~1500)    General Comments        Pertinent Vitals/Pain Pain Assessment Pain Assessment: PAINAD Breathing: normal Negative Vocalization: none Facial Expression: smiling or inexpressive Body Language: relaxed Consolability: no need to console PAINAD Score: 0 Pain Intervention(s): Monitored during session, Repositioned    Home Living                          Prior Function            PT Goals (current goals can now be found in the care plan section) Acute Rehab PT Goals Patient Stated Goal: to return home PT Goal Formulation: With patient/family Time For Goal Achievement: 01/13/22 Progress towards PT goals: Progressing toward goals    Frequency    Min 3X/week      PT Plan Current plan remains appropriate (pending progress, pt may benefit from ST SNF if cognition remains impaired unless family can provide strict 24/7 assist)    Co-evaluation              AM-PAC PT "6 Clicks" Mobility   Outcome Measure  Help needed  turning from your back to your side while in a flat bed without using bedrails?: A Little Help needed moving from lying on your back to sitting on the side of a flat bed without using bedrails?: A Lot Help needed moving to and from a bed to a chair (including a wheelchair)?: A Little Help needed standing up from a chair using your arms (e.g., wheelchair or bedside chair)?: A Little Help needed to walk in hospital room?: A Little (with regular RW) Help needed climbing 3-5 steps with a railing? : A Lot 6 Click Score: 16    End of Session Equipment Utilized During Treatment: Gait belt Activity Tolerance: Patient tolerated treatment well;Patient limited by fatigue Patient left: in chair;with call bell/phone within reach;with chair alarm set;Other (comment);with nursing/sitter in room (sitter preparing to re-enter the room; restraints off as pt more alert at that time) Nurse Communication: Mobility status;Precautions (restraints off, sitter present for pt safety) PT Visit Diagnosis: Other abnormalities of gait and mobility (  R26.89)     Time: 8657-8469 PT Time Calculation (min) (ACUTE ONLY): 23 min  Charges:  $Gait Training: 8-22 mins $Therapeutic Activity: 8-22 mins                     Christon Parada P., PTA Acute Rehabilitation Services Secure Chat Preferred 9a-5:30pm Office: [phone]    [place]us [place]ms 01/02/2022, 5:38 PM

## 2022-01-02 NOTE — Progress Notes (Signed)
Chaplain consulted to provide education re Advance Directive. As chaplain was outside of pt's door washing hands, Jacob Becker beckoned me into his room. He requested assistance moving the cord from the "phone" because he needed to go to the next room. Chaplain asked clarifying questions and determined pt thought his chest tube and catheter tube were phone cords. He was not aware he had either of those and wondered how they would get them out. Chaplain encouraged pt not to mess with them and assured the patient that the medical team will remove them safely and with necessary pain support at the appropriate time. Pt stated that he thought he was in his father in-law's room and was surprised to learn he was in the hospital. He did know that [facility] was in [place] and said he resides in town. At another point in the conversation he was concerned about someone in the hallway, chaplain assured him it was another pt taking a walk at which point, Jacob Becker stated, "well why are they parked up there outside of the room?" There was no one present near the door or in the hallway though there was a mobile computer in the patient's room closer to his bedside. Chaplain consulted with pt's nurse who verified that he has been confused. AD education or signing is not appropriate at this time. Chaplain observed pt is wearing a wedding ring and his nurse verified that he is married. Chaplain explained to pt's RN that by state law his spouse will be his default Health Care proxy and the consult would be cleared.    Please page as further needs arise.  Jacob Becker. Jacob Becker, M.Div. [facility] Chaplain Pager [phone] Office [phone]

## 2022-01-02 NOTE — Progress Notes (Signed)
..  Trauma Event Note   Pt resting comfortably. R CT to H2O seal. No c/o at this time.  Last imported Vital Signs BP (!) 143/74 (BP Location: Right Arm)   Pulse 72   Temp 98.2 F (36.8 C) (Oral)   Resp 18   Ht 5\' 10"  (1.778 m)   Wt 73.9 kg   SpO2 96%   BMI 23.38 kg/m   Trending CBC Recent Labs    12/31/21 0326 01/02/22 0236  WBC 11.0* 10.1  HGB 8.2* 8.8*  HCT 25.1* 26.0*  PLT 140* 175    Trending Coag's No results for input(s): "APTT", "INR" in the last 72 hours.  Trending BMET Recent Labs    12/31/21 0326 01/02/22 0236  NA 134* 133*  K 3.9 3.9  CL 105 105  CO2 22 21*  BUN 45* 36*  CREATININE 1.28* 1.23  GLUCOSE 136* 128*      Charnell Peplinski C Jaculin Rasmus  Trauma Response RN  Please call TRN at [phone] for further assistance.

## 2022-01-02 NOTE — Progress Notes (Signed)
[facility]  PROGRESS NOTE  Jacob Becker VOH:[id number] DOB: 08-10-1929 DOA: 12/28/2021 PCP: Emilio Aspen, MD   Brief HPI:   88 year old male with medical history of advanced dementia, chronic atrial fibrillation on Xarelto, hypertension, CAD, prostate cancer in remission on chronic hormone manipulation, hepatic and pancreatic cyst presented with near syncope and fall. Patient sustained rib fractures with right hemothorax, chest tube was placed.  Plan to go home when stable.  Trauma surgery following.    Subjective   Today patient denies any chest pain or shortness of breath.  Chest tube in place.  Chest x-ray this morning shows minimal right apical pneumothorax.   Assessment/Plan:    Right side traumatic hemothorax -Chest tube placed by trauma surgery in the ED -General surgery following -Chest x-ray obtained today shows minimal right apical pneumothorax -Management per general surgery  Acute blood loss anemia -Secondary to rib fracture and right-sided hemothorax -Hemoglobin is stable at 8.8 -Patient was given 1 unit PRBC -Xarelto has been hold; still okay with general surgery  Nondisplaced RIGHT anterior acetabular rim fracture.  -ortho consulted - WBAT, no surgery needed   AKI -Secondary to hypotension and dehydration -CT abdomen/ pelvis from 12/1 did not show hydronephrosis -Creatinine is down to 1.23    Hypertension -Blood pressure is stable -Continue amlodipine, metoprolol    Hyperkalemia -resolved   Lactic acidosis -Initial lactate was 4.9, repeat lactic acid 1.4 -Likely secondary to hypoperfusion from hypotension and bradycardia and acute blood loss anemia.     Acute metabolic encephalopathy -Resolved -delirium precautions  -PRN haldol    Near syncope -Likely from combination of hypotension and bradycardia. -On most recent cardiology evaluation in April 2023, cardiology cut down patient's metoprolol to 50 mg daily and plan to  further cut down patient's amlodipine as patient continues to lose weight.   - unfortunately, patient still taking same dosage of metoprolol XL 100 mg daily and amlodipine 10 mg daily. Resumed at lower dose -echo showed EF of 60 to 65%, no regional wall motion abnormalities, left atrial size was moderately dilated   Chronic A-fib -resume metoprolol -Heart rate is controlled -xarelto on hold for now   Chronic urinary retention -Has been doing self catheterizing at home -foley placed by urology -- will leave in until patient able to do own self caths   Prostate CA -In Remission, continue home hormone therapy medication   Liver cyst, pancreatic cyst -Stable on CT scan   Moderate protein calorie malnutrition with unintentional weight loss -about 15 lbs weight loss in last 6 months for poor appetite and food intake, - for which patient has been on Ensure twice daily.   Medications     acetaminophen  1,000 mg Oral TID   amLODipine  5 mg Oral Daily   cefdinir  300 mg Oral Q12H   Chlorhexidine Gluconate Cloth  6 each Topical Daily   colesevelam  1,250 mg Oral BID   docusate sodium  100 mg Oral BID   enzalutamide  160 mg Oral QHS   feeding supplement  237 mL Oral BID BM   heparin injection (subcutaneous)  5,000 Units Subcutaneous Q8H   lidocaine  1 patch Transdermal Q24H   metoprolol succinate  50 mg Oral Daily   rosuvastatin  40 mg Oral Daily   sodium chloride flush  3 mL Intravenous Q12H     Data Reviewed:   CBG:  Recent Labs  Lab 12/29/21 0510 12/30/21 0500 01/02/22 0440  GLUCAP 144* 122* 123*  SpO2: 99 %    Vitals:   01/01/22 1937 01/02/22 0513 01/02/22 1011 01/02/22 1623  BP: 139/74 136/70 138/70 130/74  Pulse: 77 73 70 71  Resp: 18 18 20 19   Temp: 97.6 F (36.4 C) 98.4 F (36.9 C) 98 F (36.7 C) 97.9 F (36.6 C)  TempSrc: Oral Oral Oral Oral  SpO2: 99% 99% 99% 99%  Weight:      Height:          Data Reviewed:  Basic Metabolic Panel: Recent  Labs  Lab 12/29/21 0222 12/30/21 0149 12/30/21 1031 12/31/21 0326 01/02/22 0236  NA 138 139 138 134* 133*  K 3.7 3.4* 3.6 3.9 3.9  CL 109 111 111 105 105  CO2 21* 22 20* 22 21*  GLUCOSE 136* 137* 139* 136* 128*  BUN 53* 45* 40* 45* 36*  CREATININE 1.70* 1.36* 1.34* 1.28* 1.23  CALCIUM 7.9* 7.8* 8.1* 7.7* 8.3*    CBC: Recent Labs  Lab 12/28/21 1043 12/28/21 1107 12/28/21 2251 12/29/21 0222 12/30/21 0149 12/31/21 0326 01/02/22 0236  WBC 12.2*  --   --  10.6* 9.3 11.0* 10.1  HGB 8.2*   < > 8.6* 8.4* 8.1* 8.2* 8.8*  HCT 25.5*   < > 24.9* 25.7* 24.8* 25.1* 26.0*  MCV 100.8*  --   --  94.8 95.0 95.8 94.9  PLT 159  --   --  129* 134* 140* 175   < > = values in this interval not displayed.    LFT Recent Labs  Lab 12/28/21 1043  AST 32  ALT 17  ALKPHOS 126  BILITOT 0.6  PROT 5.4*  ALBUMIN 2.5*     Antibiotics: Anti-infectives (From admission, onward)    Start     Dose/Rate Route Frequency Ordered Stop   01/01/22 1345  cefdinir (OMNICEF) capsule 300 mg        300 mg Oral Every 12 hours 01/01/22 1259     12/28/21 1215  cefTRIAXone (ROCEPHIN) 1 g in sodium chloride 0.9 % 100 mL IVPB  Status:  Discontinued        1 g 200 mL/hr over 30 Minutes Intravenous  Once 12/28/21 1205 12/28/21 1320   12/28/21 1215  azithromycin (ZITHROMAX) 500 mg in sodium chloride 0.9 % 250 mL IVPB  Status:  Discontinued        500 mg 250 mL/hr over 60 Minutes Intravenous  Once 12/28/21 1205 12/28/21 1320        DVT prophylaxis: Heparin  Code Status: Full code  Family Communication: Discussed with patient's son, daughter and daughter-in-law at bedside   CONSULTS trauma surgery   Objective    Physical Examination:   General: Appears in no acute distress Cardiovascular: S1-S2, regular Respiratory: Clear to auscultation bilaterally, right chest tube in place Abdomen: Soft, nontender, no organomegaly Extremities: No edema in the lower extremities Neurologic: Alert, oriented  x3, intact insight and judgment   Status is: Inpatient:            Meredeth Ide   [facility] If 7PM-7AM, please contact night-coverage at [email or web address], Office  [phone]   01/02/2022, 6:18 PM  LOS: 5 days

## 2022-01-03 ENCOUNTER — Inpatient Hospital Stay (HOSPITAL_COMMUNITY): Payer: Medicare Other

## 2022-01-03 DIAGNOSIS — J942 Hemothorax: Secondary | ICD-10-CM | POA: Diagnosis not present

## 2022-01-03 DIAGNOSIS — R55 Syncope and collapse: Secondary | ICD-10-CM | POA: Diagnosis not present

## 2022-01-03 DIAGNOSIS — W19XXXA Unspecified fall, initial encounter: Secondary | ICD-10-CM | POA: Diagnosis not present

## 2022-01-03 MED ORDER — AMOXICILLIN-POT CLAVULANATE 875-125 MG PO TABS
1.0000 | ORAL_TABLET | Freq: Two times a day (BID) | ORAL | Status: DC
  Administered 2022-01-03 – 2022-01-07 (×9): 1 via ORAL
  Filled 2022-01-03 (×9): qty 1

## 2022-01-03 NOTE — Progress Notes (Signed)
Physical Therapy Treatment Patient Details Name: Jacob Becker MRN: [id number] DOB: [birth year] Today's Date: 01/03/2022   History of Present Illness 88 y.o. male presents to [facility] hospital on 12/28/2021 after near syncope and a fall. Pt found to have R hemothorax, R rib 10-11 fx, R nondisplaced anterior wall acetabular fx. PMH includes dementia, afib, HTN, CAD, prostate CA.    PT Comments    Pt continues with difficulty with mobility especially sequencing requiring heavy assistance in order to get EOB for mobility. Pt requires frequent re-direction for gait and navigating AD. Due to level of assistance at home, home set-up and pt current level of function pt will benefit from skilled physical therapy services in SNF setting following discharge from acute care hospital setting in order to return home safely following rehab. Pt will need constant supervision/assistance with mobility on discharge from acute care hospital setting. Pt demonstrates no signs/symptoms of cardiac/respiratory distress throughout session.     Recommendations for follow up therapy are one component of a multi-disciplinary discharge planning process, led by the attending physician.  Recommendations may be updated based on patient status, additional functional criteria and insurance authorization.  Follow Up Recommendations  Skilled nursing-short term rehab (<3 hours/day) Can patient physically be transported by private vehicle: Yes   Assistance Recommended at Discharge Frequent or constant Supervision/Assistance  Patient can return home with the following Help with stairs or ramp for entrance;A little help with walking and/or transfers;Assist for transportation;Direct supervision/assist for medications management;Direct supervision/assist for financial management;A little help with bathing/dressing/bathroom   Equipment Recommendations  Rolling walker (2 wheels)    Recommendations for Other Services       Precautions /  Restrictions Precautions Precautions: Fall Precaution Comments: chest tube, dementia related confusion, sitter present today Restrictions Weight Bearing Restrictions: No     Mobility  Bed Mobility Overal bed mobility: Needs Assistance Bed Mobility: Supine to Sit     Supine to sit: Mod assist, HOB elevated     General bed mobility comments: increased time/effort to R EOB, assist with bed pad to advance hips with up to modA due to pt cognitive deficit and difficulty sequencing Patient Response: Cooperative  Transfers Overall transfer level: Needs assistance Equipment used: Rolling walker (2 wheels) Transfers: Sit to/from Stand Sit to Stand: Min assist   Step pivot transfers: Min guard       General transfer comment: From EOB then gait to sitting in recliner. Pt requires specific instructions especially for AD navigation and safety sequencing.    Ambulation/Gait Ambulation/Gait assistance: Min assist, Min guard Gait Distance (Feet): 50 Feet Assistive device: Rolling walker (2 wheels) Gait Pattern/deviations: Step-through pattern, Drifts right/left, Narrow base of support, Trunk flexed Gait velocity: decreased     General Gait Details: Pt has lateral sway and requires minimal assistance with AD navigation.       Balance Overall balance assessment: Needs assistance Sitting-balance support: No upper extremity supported, Feet supported Sitting balance-Leahy Scale: Good     Standing balance support: Reliant on assistive device for balance, Bilateral upper extremity supported Standing balance-Leahy Scale: Poor Standing balance comment: reliant on AD                            Cognition Arousal/Alertness: Awake/alert Behavior During Therapy: WFL for tasks assessed/performed Overall Cognitive Status: History of cognitive impairments - at baseline           General Comments: Baseline dementia. Initially pt requires some re-direction but  was able to  improve and participate fully in session.               Pertinent Vitals/Pain Pain Assessment Pain Assessment: No/denies pain Pain Intervention(s): Monitored during session     PT Goals (current goals can now be found in the care plan section) Acute Rehab PT Goals Patient Stated Goal: to return home PT Goal Formulation: With patient/family Time For Goal Achievement: 01/13/22 Potential to Achieve Goals: Good Progress towards PT goals: Progressing toward goals    Frequency    Min 3X/week      PT Plan Discharge plan needs to be updated       AM-PAC PT "6 Clicks" Mobility   Outcome Measure  Help needed turning from your back to your side while in a flat bed without using bedrails?: A Little Help needed moving from lying on your back to sitting on the side of a flat bed without using bedrails?: A Lot Help needed moving to and from a bed to a chair (including a wheelchair)?: A Little Help needed standing up from a chair using your arms (e.g., wheelchair or bedside chair)?: A Little Help needed to walk in hospital room?: A Little Help needed climbing 3-5 steps with a railing? : A Lot 6 Click Score: 16    End of Session Equipment Utilized During Treatment: Gait belt Activity Tolerance: Patient tolerated treatment well Patient left: in chair;with call bell/phone within reach;with chair alarm set;with nursing/sitter in room Nurse Communication: Mobility status PT Visit Diagnosis: Other abnormalities of gait and mobility (R26.89)     Time: 8657-8469 PT Time Calculation (min) (ACUTE ONLY): 23 min  Charges:  $Gait Training: 8-22 mins $Therapeutic Activity: 8-22 mins                     Jacob Becker, DPT, Jacob Becker  Acute Rehabilitation Services Office: [phone] (Secure chat preferred)    Jacob Becker 01/03/2022, 4:15 PM

## 2022-01-03 NOTE — Plan of Care (Signed)
  Problem: Education: Goal: Knowledge of condition and prescribed therapy will improve Outcome: Progressing   Problem: Cardiac: Goal: Will achieve and/or maintain adequate cardiac output Outcome: Progressing   Problem: Physical Regulation: Goal: Complications related to the disease process, condition or treatment will be avoided or minimized Outcome: Progressing   Problem: Safety: Goal: Non-violent Restraint(s) Outcome: Progressing   Problem: Education: Goal: Knowledge of General Education information will improve Description: Including pain rating scale, medication(s)/side effects and non-pharmacologic comfort measures Outcome: Progressing   Problem: Health Behavior/Discharge Planning: Goal: Ability to manage health-related needs will improve Outcome: Progressing   Problem: Clinical Measurements: Goal: Ability to maintain clinical measurements within normal limits will improve Outcome: Progressing Goal: Will remain free from infection Outcome: Progressing Goal: Diagnostic test results will improve Outcome: Progressing Goal: Respiratory complications will improve Outcome: Progressing Goal: Cardiovascular complication will be avoided Outcome: Progressing   Problem: Activity: Goal: Risk for activity intolerance will decrease Outcome: Progressing   Problem: Nutrition: Goal: Adequate nutrition will be maintained Outcome: Progressing   Problem: Coping: Goal: Level of anxiety will decrease Outcome: Progressing   Problem: Elimination: Goal: Will not experience complications related to bowel motility Outcome: Progressing Goal: Will not experience complications related to urinary retention Outcome: Progressing   Problem: Pain Managment: Goal: General experience of comfort will improve Outcome: Progressing   Problem: Safety: Goal: Ability to remain free from injury will improve Outcome: Progressing   Problem: Skin Integrity: Goal: Risk for impaired skin integrity  will decrease Outcome: Progressing   Problem: Safety: Goal: Non-violent Restraint(s) Outcome: Progressing   

## 2022-01-03 NOTE — Progress Notes (Signed)
OT Cancellation Note  Patient Details Name: Jacob Becker MRN: [id number] DOB: 01-09-1930   Cancelled Treatment:    Reason Eval/Treat Not Completed: Other (comment) (Per sitter they just got pt back in bed from using the bathroom and was very weak. Pt is now sleeping and requesting to come back later.) Alphia Moh OTR/L  Acute Rehab Services  [phone] office number    Alphia Moh 01/03/2022, 11:24 AM

## 2022-01-03 NOTE — Progress Notes (Signed)
[facility] Progress Note     Subjective: CC-  Daughter at bedside. Patient with no complaints. Denies CP or SOB. Chest tube with 60cc drainage last 24 hours. CXR this morning with worsening pneumothorax  Daughter states that patient fell a previous time about 2 weeks ago and required sutures in his left elbow. States that they're supposed to be removed soon.  Objective: Vital signs in last 24 hours: Temp:  [97.9 F (36.6 C)-98.4 F (36.9 C)] 98.4 F (36.9 C) (12/07 0737) Pulse Rate:  [68-78] 78 (12/07 0737) Resp:  [16-20] 19 (12/07 0737) BP: (130-148)/(70-88) 148/88 (12/07 0737) SpO2:  [96 %-99 %] 98 % (12/07 0737) Last BM Date : 12/31/21  Intake/Output from previous day: 12/06 0701 - 12/07 0700 In: 580 [P.O.:580] Out: 1386 [Urine:1325; Stool:1; Chest Tube:60] Intake/Output this shift: No intake/output data recorded.  PE: Gen:  Alert, NAD Card:  normal heart rate Pulm:  CTAB, no W/R/R, rate and effort normal on room air. Right chest tube without air leak.  Abd: Soft, NT/ND Ext:  no BUE/BLE edema, calves soft and nontender. Left elbow lac with sutures intact, mild erythema and some fluctuance- feels like a small effusion over the elbow - no purulent drainage or warmth. able to actively range the elbow without pain Psych: Alert, oriented to self only  Skin: no rashes noted, warm and dry  Lab Results:  Recent Labs    01/02/22 0236  WBC 10.1  HGB 8.8*  HCT 26.0*  PLT 175   BMET Recent Labs    01/02/22 0236  NA 133*  K 3.9  CL 105  CO2 21*  GLUCOSE 128*  BUN 36*  CREATININE 1.23  CALCIUM 8.3*   PT/INR No results for input(s): "LABPROT", "INR" in the last 72 hours. CMP     Component Value Date/Time   NA 133 (L) 01/02/2022 0236   K 3.9 01/02/2022 0236   CL 105 01/02/2022 0236   CO2 21 (L) 01/02/2022 0236   GLUCOSE 128 (H) 01/02/2022 0236   BUN 36 (H) 01/02/2022 0236   CREATININE 1.23 01/02/2022 0236   CALCIUM 8.3 (L) 01/02/2022 0236    PROT 5.4 (L) 12/28/2021 1043   ALBUMIN 2.5 (L) 12/28/2021 1043   AST 32 12/28/2021 1043   ALT 17 12/28/2021 1043   ALKPHOS 126 12/28/2021 1043   BILITOT 0.6 12/28/2021 1043   GFRNONAA 56 (L) 01/02/2022 0236   Lipase  No results found for: "LIPASE"     Studies/Results: DG CHEST PORT 1 VIEW  Result Date: 01/03/2022 CLINICAL DATA:  Pneumothorax. EXAM: PORTABLE CHEST 1 VIEW COMPARISON:  January 02, 2022. FINDINGS: Stable cardiomediastinal silhouette. Right-sided chest tube is unchanged. Moderate right apical pneumothorax is noted which is significantly increased in size compared to prior exam. Bony thorax is unremarkable. IMPRESSION: Stable position of right-sided chest tube. Moderate right apical pneumothorax is noted which is significantly increased in size compared to prior exam. Electronically Signed   By: Lupita Raider M.D.   On: 01/03/2022 08:48   DG CHEST PORT 1 VIEW  Result Date: 01/02/2022 CLINICAL DATA:  Chest tube in place. EXAM: PORTABLE CHEST 1 VIEW COMPARISON:  January 01, 2022. FINDINGS: Stable cardiomediastinal silhouette. Right-sided chest tube is unchanged in position. Minimal right apical pneumothorax is noted. Left lung is clear. Right infrahilar atelectasis or infiltrate is again noted. IMPRESSION: Stable position of right-sided chest tube. Minimal right apical pneumothorax is noted. Right infrahilar opacity as described above. Electronically Signed   By: Fayrene Fearing  Christen Butter M.D.   On: 01/02/2022 10:18   DG CHEST PORT 1 VIEW  Result Date: 01/01/2022 CLINICAL DATA:  Pneumothorax EXAM: PORTABLE CHEST 1 VIEW COMPARISON:  Previous studies including the examination done earlier today FINDINGS: Transverse diameter of heart is slightly increased. Thoracic aorta is tortuous and ectatic. Subtle increased markings in the medial right lower lung field has not changed significantly. There are no new infiltrates or signs of pulmonary edema. There is no demonstrable pneumothorax. Minimal  right apical pneumothorax seen in the immediate previous study is not distinctly seen in the current study. Right chest tube is present with its tip in the lateral aspect of right upper lung fields. Costophrenic angles are clear. IMPRESSION: Minimal right apical pneumothorax seen in the previous study could not be identified in the current study. Right chest tube is present with its tip in the lateral aspect of right upper lung fields. Small infiltrate in the medial right lower lung field has not changed significantly. Electronically Signed   By: Ernie Avena M.D.   On: 01/01/2022 15:19    Anti-infectives: Anti-infectives (From admission, onward)    Start     Dose/Rate Route Frequency Ordered Stop   01/01/22 1345  cefdinir (OMNICEF) capsule 300 mg        300 mg Oral Every 12 hours 01/01/22 1259     12/28/21 1215  cefTRIAXone (ROCEPHIN) 1 g in sodium chloride 0.9 % 100 mL IVPB  Status:  Discontinued        1 g 200 mL/hr over 30 Minutes Intravenous  Once 12/28/21 1205 12/28/21 1320   12/28/21 1215  azithromycin (ZITHROMAX) 500 mg in sodium chloride 0.9 % 250 mL IVPB  Status:  Discontinued        500 mg 250 mL/hr over 60 Minutes Intravenous  Once 12/28/21 1205 12/28/21 1320        Assessment/Plan Fall on xarelto   R hemothorax - s/p chest tube placement 12/2. Worsening PNX on CXR today, return CT to -20 cm suction and repeat CXR this afternoon to ensure improvement. Encourage IS.   R rib fractures 10-11 - multimodal pain control and pulm toilet ABL anemia - s/p 1u PRBCs 12/1. Hgb 8.8 (12/6), stable Right anterior acetabular rib fx - per ortho, Dr. Sherilyn Dacosta, nonop, WBAT RLE Left elbow lac - from previous fall. Sutures removed today. Suspect he has some bursitis, apply ice and compression and monitor for s/s of infection Chronic A fib on xarelto - would consider holding anticoagulation indefinitely  Chronic urinary retention - I&O caths at home. Foley placed by urology 12/2. Patient  requesting removal this AM Prostate cancer HTN HLD   ID - none currently FEN - IVF per TRH, HH/CM diet VTE - SCDs, sq heparin Foley - placed by urology 12/2   Dispo - Chest tube as above. PT/OT.     LOS: 6 days    Franne Forts, [facility] 01/03/2022, 9:18 AM Please see Amion for pager number during day hours 7:00am-4:30pm

## 2022-01-03 NOTE — Progress Notes (Signed)
Trauma Event Note   Pt's other chart reviewed - sutures in elbow need to be removed 12/8    Erlin Gardella O Brithney Bensen  Trauma Response RN  Please call TRN at [phone] for further assistance.

## 2022-01-03 NOTE — [place] FL2 (Signed)
[place] MEDICAID FL2 LEVEL OF CARE FORM     IDENTIFICATION  Patient Name: Jacob Becker Birthdate: Sep 26, 1929 Sex: male Admission Date (Current Location): 12/28/2021  [facility] and [place] Number:  Producer, television/film/video and Address:  [facility], [address] [place], [place] [address]      Provider Number: [id number]  Attending Physician Name and Address:  Meredeth Ide, MD  Relative Name and Phone Number:  Tarri Abernethy [phone]    Current Level of Care: Hospital Recommended Level of Care: Skilled Nursing Facility Prior Approval Number:    Date Approved/Denied:   PASRR Number: [id number]  Discharge Plan: SNF    Current Diagnoses: Patient Active Problem List   Diagnosis Date Noted   Near syncope 12/28/2021   AKI (acute kidney injury) (HCC) 12/28/2021   Hemothorax on right 12/28/2021   Hyperkalemia 12/28/2021   A-fib (HCC) 12/28/2021    Orientation RESPIRATION BLADDER Height & Weight     Self, Place  Normal Incontinent, Indwelling catheter Weight: 162 lb 14.7 oz (73.9 kg) Height:  5\' 10"  (177.8 cm)  BEHAVIORAL SYMPTOMS/MOOD NEUROLOGICAL BOWEL NUTRITION STATUS      Continent Diet (See DC summary)  AMBULATORY STATUS COMMUNICATION OF NEEDS Skin   Limited Assist Verbally Surgical wounds (R chest incision)                       Personal Care Assistance Level of Assistance  Bathing, Feeding, Dressing Bathing Assistance: Limited assistance Feeding assistance: Limited assistance Dressing Assistance: Limited assistance     Functional Limitations Info  Sight, Hearing, Speech Sight Info: Adequate Hearing Info: Adequate Speech Info: Adequate    SPECIAL CARE FACTORS FREQUENCY  PT (By licensed PT), OT (By licensed OT)     PT Frequency: 5x week OT Frequency: 5x week            Contractures Contractures Info: Not present    Additional Factors Info  Code Status, Allergies Code Status Info: Full Allergies Info:  NKA           Current Medications (01/03/2022):  This is the current hospital active medication list Current Facility-Administered Medications  Medication Dose Route Frequency Provider Last Rate Last Admin   acetaminophen (TYLENOL) tablet 1,000 mg  1,000 mg Oral TID Vann, Jaloni Sorber U, DO   1,000 mg at 01/03/22 0931   amLODipine (NORVASC) tablet 5 mg  5 mg Oral Daily Vann, Pinky Ravan U, DO   5 mg at 01/03/22 0930   amoxicillin-clavulanate (AUGMENTIN) 875-125 MG per tablet 1 tablet  1 tablet Oral Q12H Meuth, Brooke A, PA-C   1 tablet at 01/03/22 1224   bisacodyl (DULCOLAX) suppository 10 mg  10 mg Rectal Daily PRN Meuth, Brooke A, PA-C       Chlorhexidine Gluconate Cloth 2 % PADS 6 each  6 each Topical Daily Marlin Canary U, DO   6 each at 01/03/22 0931   colesevelam [facility]) tablet 1,250 mg  1,250 mg Oral BID Mikey College T, MD   1,250 mg at 01/03/22 0900   docusate sodium (COLACE) capsule 100 mg  100 mg Oral BID Meuth, Brooke A, PA-C   100 mg at 01/03/22 0102   enzalutamide (XTANDI) tablet 160 mg  160 mg Oral QHS Vann, Charlet Harr U, DO   160 mg at 01/02/22 2245   feeding supplement (ENSURE ENLIVE / ENSURE PLUS) liquid 237 mL  237 mL Oral BID BM Emeline General, MD  237 mL at 01/03/22 1224   fentaNYL (SUBLIMAZE) injection 12.5 mcg  12.5 mcg Intravenous Q6H PRN Meuth, Brooke A, PA-C   12.5 mcg at 12/30/21 2022   haloperidol lactate (HALDOL) injection 2.5 mg  2.5 mg Intravenous Q6H PRN Marlin Canary U, DO       heparin injection 5,000 Units  5,000 Units Subcutaneous Q8H Vann, Vivienne Sangiovanni U, DO   5,000 Units at 01/03/22 0539   lidocaine (LIDODERM) 5 % 1 patch  1 patch Transdermal Q24H Meuth, Brooke A, PA-C   1 patch at 01/01/22 1610   methocarbamol (ROBAXIN) tablet 500 mg  500 mg Oral Q6H PRN Meuth, Brooke A, PA-C       metoprolol succinate (TOPROL-XL) 24 hr tablet 50 mg  50 mg Oral Daily Vann, Ryin Ambrosius U, DO   50 mg at 01/03/22 0930   metoprolol tartrate (LOPRESSOR) injection 2.5 mg  2.5 mg Intravenous Q6H PRN  Mikey College T, MD       polyethylene glycol (MIRALAX / GLYCOLAX) packet 17 g  17 g Oral Daily PRN Meuth, Brooke A, PA-C       rosuvastatin (CRESTOR) tablet 40 mg  40 mg Oral Daily Mikey College T, MD   40 mg at 01/03/22 0931   sodium chloride flush (NS) 0.9 % injection 3 mL  3 mL Intravenous Q12H Mikey College T, MD   3 mL at 01/03/22 0931   traMADol (ULTRAM) tablet 50 mg  50 mg Oral Q6H PRN Franne Forts, PA-C         Discharge Medications: Please see discharge summary for a list of discharge medications.  Relevant Imaging Results:  Relevant Lab Results:   Additional Information SS# [id number]

## 2022-01-03 NOTE — TOC Progression Note (Signed)
Transition of Care [facility]) - Progression Note    Patient Details  Name: Jacob Becker MRN: [id number] Date of Birth: 1930/01/01  [facility] CM/SW Contact  Carley Hammed, [place] Phone Number: 01/03/2022, 2:10 PM  Clinical Narrative:    CSW was notified that family is reconsidering their next level of care. CSW spoke with daughter Dondra Spry who notes that pt was doing much better previously, when they were planning on taking him home. Now he is needing more care and doing so would be expensive as they also have his wife needing care. CSW discussed SNF options and family is agreeable to a fax out for options, but would also like to be considered for CIR. Family also noted they need to start thinking about a longer term option for pt and spouse. CSW provided contact to Whitney with [facility], who will be able to assist with next steps.  If next step is an ALF/ MC, HH PT/ OT may be arranged at the facility.  CSW spoke with Britta Mccreedy at [facility] who noted pt will need a recommendation prior to being considered. PT notified and will attempt to see him. CSW to complete workup and faxout. TOC will continue to work toward an appropriate disposition.    Expected Discharge Plan: Home w Home Health Services Barriers to Discharge: Continued Medical Work up  Expected Discharge Plan and Services Expected Discharge Plan: Home w Home Health Services   Discharge Planning Services: CM Consult Post Acute Care Choice: Home Health Living arrangements for the past 2 months: Single Family Home                 DME Arranged:  (awaiting decision walker vs rollator)         HH Arranged: RN, PT, OT HH Agency: Other - See comment ([facility]) Date HH Agency Contacted: 12/31/21 Time HH Agency Contacted: 1425 Representative spoke with at [facility] Agency: Eber Jones   Social Determinants of Health (SDOH) Interventions    Readmission Risk Interventions     No data to display

## 2022-01-03 NOTE — Progress Notes (Signed)
[facility]  PROGRESS NOTE  Jacob Becker QMV:[id number] DOB: Jun 10, 1929 DOA: 12/28/2021 PCP: Emilio Aspen, MD   Brief HPI:   88 year old male with medical history of advanced dementia, chronic atrial fibrillation on Xarelto, hypertension, CAD, prostate cancer in remission on chronic hormone manipulation, hepatic and pancreatic cyst presented with near syncope and fall. Patient sustained rib fractures with right hemothorax, chest tube was placed.  Plan to go home when stable.  Trauma surgery following.    Subjective   Patient seen and examined, chest x-ray this morning shows worsening pneumothorax.  Chest tube has been returned to suction.   Assessment/Plan:    Right side traumatic hemothorax -Chest tube placed by trauma surgery in the ED -General surgery following -Chest x-ray obtained today shows worsening pneumothorax -Chest tube returned to suction to -20 mmhg -Repeat chest x-ray showed reexpansion of lung with no pneumothorax -Management per general surgery  Acute blood loss anemia -Secondary to rib fracture and right-sided hemothorax -Hemoglobin is stable at 8.8 -Patient was given 1 unit PRBC -Xarelto has been hold; till okay with general surgery  Nondisplaced RIGHT anterior acetabular rim fracture.  -ortho consulted - WBAT, no surgery needed   AKI -Secondary to hypotension and dehydration -CT abdomen/ pelvis from 12/1 did not show hydronephrosis -Creatinine is down to 1.23    Hypertension -Blood pressure is stable -Continue amlodipine, metoprolol    Hyperkalemia -resolved   Lactic acidosis -Initial lactate was 4.9, repeat lactic acid 1.4 -Likely secondary to hypoperfusion from hypotension and bradycardia and acute blood loss anemia.     Acute metabolic encephalopathy -Resolved -delirium precautions  -PRN haldol    Near syncope -Likely from combination of hypotension and bradycardia. -On most recent cardiology evaluation in April  2023, cardiology cut down patient's metoprolol to 50 mg daily and plan to further cut down patient's amlodipine as patient continues to lose weight.   - unfortunately, patient still taking same dosage of metoprolol XL 100 mg daily and amlodipine 10 mg daily. Resumed at lower dose -echo showed EF of 60 to 65%, no regional wall motion abnormalities, left atrial size was moderately dilated   Chronic A-fib -resume metoprolol -Heart rate is controlled -xarelto on hold for now   Chronic urinary retention -Has been doing self catheterizing at home -foley placed by urology -- will leave in until patient able to do own self caths   Prostate CA -In Remission, continue home hormone therapy medication   Liver cyst, pancreatic cyst -Stable on CT scan   Moderate protein calorie malnutrition with unintentional weight loss -about 15 lbs weight loss in last 6 months for poor appetite and food intake, - for which patient has been on Ensure twice daily.   Medications     acetaminophen  1,000 mg Oral TID   amLODipine  5 mg Oral Daily   amoxicillin-clavulanate  1 tablet Oral Q12H   Chlorhexidine Gluconate Cloth  6 each Topical Daily   colesevelam  1,250 mg Oral BID   docusate sodium  100 mg Oral BID   enzalutamide  160 mg Oral QHS   feeding supplement  237 mL Oral BID BM   heparin injection (subcutaneous)  5,000 Units Subcutaneous Q8H   lidocaine  1 patch Transdermal Q24H   metoprolol succinate  50 mg Oral Daily   rosuvastatin  40 mg Oral Daily   sodium chloride flush  3 mL Intravenous Q12H     Data Reviewed:   CBG:  Recent Labs  Lab 12/29/21 0510 12/30/21  0500 01/02/22 0440  GLUCAP 144* 122* 123*    SpO2: 98 %    Vitals:   01/02/22 2006 01/03/22 0644 01/03/22 0737 01/03/22 1537  BP: (!) 143/74 (!) 142/79 (!) 148/88 113/63  Pulse: 72 68 78 64  Resp: 18 16 19 18   Temp: 98.2 F (36.8 C) 98 F (36.7 C) 98.4 F (36.9 C) 98.1 F (36.7 C)  TempSrc: Oral Oral Oral Oral  SpO2:  96% 99% 98% 98%  Weight:      Height:          Data Reviewed:  Basic Metabolic Panel: Recent Labs  Lab 12/29/21 0222 12/30/21 0149 12/30/21 1031 12/31/21 0326 01/02/22 0236  NA 138 139 138 134* 133*  K 3.7 3.4* 3.6 3.9 3.9  CL 109 111 111 105 105  CO2 21* 22 20* 22 21*  GLUCOSE 136* 137* 139* 136* 128*  BUN 53* 45* 40* 45* 36*  CREATININE 1.70* 1.36* 1.34* 1.28* 1.23  CALCIUM 7.9* 7.8* 8.1* 7.7* 8.3*    CBC: Recent Labs  Lab 12/28/21 1043 12/28/21 1107 12/28/21 2251 12/29/21 0222 12/30/21 0149 12/31/21 0326 01/02/22 0236  WBC 12.2*  --   --  10.6* 9.3 11.0* 10.1  HGB 8.2*   < > 8.6* 8.4* 8.1* 8.2* 8.8*  HCT 25.5*   < > 24.9* 25.7* 24.8* 25.1* 26.0*  MCV 100.8*  --   --  94.8 95.0 95.8 94.9  PLT 159  --   --  129* 134* 140* 175   < > = values in this interval not displayed.    LFT Recent Labs  Lab 12/28/21 1043  AST 32  ALT 17  ALKPHOS 126  BILITOT 0.6  PROT 5.4*  ALBUMIN 2.5*     Antibiotics: Anti-infectives (From admission, onward)    Start     Dose/Rate Route Frequency Ordered Stop   01/03/22 1215  amoxicillin-clavulanate (AUGMENTIN) 875-125 MG per tablet 1 tablet        1 tablet Oral Every 12 hours 01/03/22 1125     01/01/22 1345  cefdinir (OMNICEF) capsule 300 mg  Status:  Discontinued        300 mg Oral Every 12 hours 01/01/22 1259 01/03/22 1125   12/28/21 1215  cefTRIAXone (ROCEPHIN) 1 g in sodium chloride 0.9 % 100 mL IVPB  Status:  Discontinued        1 g 200 mL/hr over 30 Minutes Intravenous  Once 12/28/21 1205 12/28/21 1320   12/28/21 1215  azithromycin (ZITHROMAX) 500 mg in sodium chloride 0.9 % 250 mL IVPB  Status:  Discontinued        500 mg 250 mL/hr over 60 Minutes Intravenous  Once 12/28/21 1205 12/28/21 1320        DVT prophylaxis: Heparin  Code Status: Full code  Family Communication: Discussed with patient's daughter on phone    CONSULTS trauma surgery   Objective    Physical Examination:  General-appears in  no acute distress Heart-S1-S2, regular, no murmur auscultated Lungs-clear to auscultation bilaterally, chest tube in place Abdomen-soft, nontender, no organomegaly Extremities-no edema in the lower extremities Neuro-alert, oriented x3, no focal deficit noted  Status is: Inpatient:            Meredeth Ide   [facility] If 7PM-7AM, please contact night-coverage at [email or web address], Office  [phone]   01/03/2022, 4:35 PM  LOS: 6 days

## 2022-01-03 NOTE — Progress Notes (Signed)
Patients chest tube started leaking when being transferred to bathroom by techs. Brooke Meuth,PA paged and came to examine patient. She stated that the leaking is fine and he will be getting another xray done this afternoon. Lama,MD also made aware.

## 2022-01-04 ENCOUNTER — Inpatient Hospital Stay (HOSPITAL_COMMUNITY): Payer: Medicare Other

## 2022-01-04 DIAGNOSIS — R609 Edema, unspecified: Secondary | ICD-10-CM

## 2022-01-04 DIAGNOSIS — M7989 Other specified soft tissue disorders: Secondary | ICD-10-CM

## 2022-01-04 DIAGNOSIS — W19XXXA Unspecified fall, initial encounter: Secondary | ICD-10-CM | POA: Diagnosis not present

## 2022-01-04 DIAGNOSIS — J942 Hemothorax: Secondary | ICD-10-CM | POA: Diagnosis not present

## 2022-01-04 DIAGNOSIS — R55 Syncope and collapse: Secondary | ICD-10-CM | POA: Diagnosis not present

## 2022-01-04 LAB — CBC
HCT: 25.5 % — ABNORMAL LOW (ref 39.0–52.0)
Hemoglobin: 8.7 g/dL — ABNORMAL LOW (ref 13.0–17.0)
MCH: 32.5 pg (ref 26.0–34.0)
MCHC: 34.1 g/dL (ref 30.0–36.0)
MCV: 95.1 fL (ref 80.0–100.0)
Platelets: 210 10*3/uL (ref 150–400)
RBC: 2.68 MIL/uL — ABNORMAL LOW (ref 4.22–5.81)
RDW: 14.8 % (ref 11.5–15.5)
WBC: 9.6 10*3/uL (ref 4.0–10.5)
nRBC: 0 % (ref 0.0–0.2)

## 2022-01-04 LAB — SYNOVIAL CELL COUNT + DIFF, W/ CRYSTALS
Crystals, Fluid: NONE SEEN
Eosinophils-Synovial: 0 % (ref 0–1)
Lymphocytes-Synovial Fld: 1 % (ref 0–20)
Monocyte-Macrophage-Synovial Fluid: 0 % — ABNORMAL LOW (ref 50–90)
Neutrophil, Synovial: 99 % — ABNORMAL HIGH (ref 0–25)
WBC, Synovial: UNDETERMINED /mm3 (ref 0–200)

## 2022-01-04 LAB — BASIC METABOLIC PANEL
Anion gap: 8 (ref 5–15)
BUN: 34 mg/dL — ABNORMAL HIGH (ref 8–23)
CO2: 22 mmol/L (ref 22–32)
Calcium: 8.3 mg/dL — ABNORMAL LOW (ref 8.9–10.3)
Chloride: 106 mmol/L (ref 98–111)
Creatinine, Ser: 1.28 mg/dL — ABNORMAL HIGH (ref 0.61–1.24)
GFR, Estimated: 53 mL/min — ABNORMAL LOW (ref >60)
Glucose, Bld: 112 mg/dL — ABNORMAL HIGH (ref 70–99)
Potassium: 4.2 mmol/L (ref 3.5–5.1)
Sodium: 136 mmol/L (ref 135–145)

## 2022-01-04 NOTE — Progress Notes (Addendum)
Patient ID: Jacob Becker, male   DOB: [birth year], 88 y.o.   MRN: [id number] [facility] Surgery Progress Note     Subjective: CC-  Daughter at bedside. Patient complaining of left elbow pain for the first time yesterday. Denies CP or SOB. Chest tube output not recorded. Tolerating diet. BM yesterday.  Objective: Vital signs in last 24 hours: Temp:  [98.1 F (36.7 C)-98.5 F (36.9 C)] 98.2 F (36.8 C) (12/08 0522) Pulse Rate:  [64-72] 72 (12/08 0522) Resp:  [18-19] 18 (12/08 0522) BP: (113-151)/(62-70) 151/70 (12/08 0522) SpO2:  [97 %-98 %] 97 % (12/08 0522) Weight:  [69.9 kg] 69.9 kg (12/08 0615) Last BM Date : 01/03/22 (Per report)  Intake/Output from previous day: 12/07 0701 - 12/08 0700 In: 480 [P.O.:480] Out: 1450 [Urine:1450] Intake/Output this shift: No intake/output data recorded.  PE: Gen:  Alert, NAD Card:  normal heart rate Pulm:  CTAB, no W/R/R, rate and effort normal on room air. Right chest tube without air leak. serosanguinous drainage in [place] (fluid marked at 410cc this morning) Abd: Soft, NT/ND Psych: Alert, oriented to self only  Skin: no rashes noted, warm and dry Ext:  Left elbow lac pictured below with scabbing, mild erythema and some fluctuance- feels like a small effusion over the elbow - no purulent drainage or warmth, he does have some mild edema extending into the distal/medial upper arm. able to actively range the elbow without pain   Lab Results:  Recent Labs    01/02/22 0236 01/04/22 0320  WBC 10.1 9.6  HGB 8.8* 8.7*  HCT 26.0* 25.5*  PLT 175 210   BMET Recent Labs    01/02/22 0236 01/04/22 0320  NA 133* 136  K 3.9 4.2  CL 105 106  CO2 21* 22  GLUCOSE 128* 112*  BUN 36* 34*  CREATININE 1.23 1.28*  CALCIUM 8.3* 8.3*   PT/INR No results for input(s): "LABPROT", "INR" in the last 72 hours. CMP     Component Value Date/Time   NA 136 01/04/2022 0320   K 4.2 01/04/2022 0320   CL 106 01/04/2022 0320   CO2 22  01/04/2022 0320   GLUCOSE 112 (H) 01/04/2022 0320   BUN 34 (H) 01/04/2022 0320   CREATININE 1.28 (H) 01/04/2022 0320   CALCIUM 8.3 (L) 01/04/2022 0320   PROT 5.4 (L) 12/28/2021 1043   ALBUMIN 2.5 (L) 12/28/2021 1043   AST 32 12/28/2021 1043   ALT 17 12/28/2021 1043   ALKPHOS 126 12/28/2021 1043   BILITOT 0.6 12/28/2021 1043   GFRNONAA 53 (L) 01/04/2022 0320   Lipase  No results found for: "LIPASE"     Studies/Results: DG CHEST PORT 1 VIEW  Result Date: 01/03/2022 CLINICAL DATA:  Pneumothorax post chest tube placement. EXAM: PORTABLE CHEST 1 VIEW COMPARISON:  January 03, 2022 FINDINGS: Re-expansion of the RIGHT lung. Suspect advancement also of RIGHT-sided chest tube tip near the lateral margin of the RIGHT fifth rib. No visible pleural line to indicate persistent pneumothorax in the RIGHT chest. Cardiomediastinal contours and hilar structures are stable. EKG leads project over the chest.  LEFT chest is clear. On limited assessment no acute skeletal process. IMPRESSION: Re-expansion of the RIGHT lung without current evidence of visible pneumothorax. Chest tube may have been repositioned as well since previous imaging. Electronically Signed   By: Donzetta Kohut M.D.   On: 01/03/2022 15:00   DG CHEST PORT 1 VIEW  Result Date: 01/03/2022 CLINICAL DATA:  Pneumothorax. EXAM: PORTABLE CHEST 1 VIEW COMPARISON:  January 02, 2022. FINDINGS: Stable cardiomediastinal silhouette. Right-sided chest tube is unchanged. Moderate right apical pneumothorax is noted which is significantly increased in size compared to prior exam. Bony thorax is unremarkable. IMPRESSION: Stable position of right-sided chest tube. Moderate right apical pneumothorax is noted which is significantly increased in size compared to prior exam. Electronically Signed   By: Lupita Raider M.D.   On: 01/03/2022 08:48   DG CHEST PORT 1 VIEW  Result Date: 01/02/2022 CLINICAL DATA:  Chest tube in place. EXAM: PORTABLE CHEST 1 VIEW  COMPARISON:  January 01, 2022. FINDINGS: Stable cardiomediastinal silhouette. Right-sided chest tube is unchanged in position. Minimal right apical pneumothorax is noted. Left lung is clear. Right infrahilar atelectasis or infiltrate is again noted. IMPRESSION: Stable position of right-sided chest tube. Minimal right apical pneumothorax is noted. Right infrahilar opacity as described above. Electronically Signed   By: Lupita Raider M.D.   On: 01/02/2022 10:18    Anti-infectives: Anti-infectives (From admission, onward)    Start     Dose/Rate Route Frequency Ordered Stop   01/03/22 1215  amoxicillin-clavulanate (AUGMENTIN) 875-125 MG per tablet 1 tablet        1 tablet Oral Every 12 hours 01/03/22 1125     01/01/22 1345  cefdinir (OMNICEF) capsule 300 mg  Status:  Discontinued        300 mg Oral Every 12 hours 01/01/22 1259 01/03/22 1125   12/28/21 1215  cefTRIAXone (ROCEPHIN) 1 g in sodium chloride 0.9 % 100 mL IVPB  Status:  Discontinued        1 g 200 mL/hr over 30 Minutes Intravenous  Once 12/28/21 1205 12/28/21 1320   12/28/21 1215  azithromycin (ZITHROMAX) 500 mg in sodium chloride 0.9 % 250 mL IVPB  Status:  Discontinued        500 mg 250 mL/hr over 60 Minutes Intravenous  Once 12/28/21 1205 12/28/21 1320        Assessment/Plan Fall on xarelto   R hemothorax - s/p chest tube placement 12/2. Continue CT to -20 cm suction today and repeat CXR in the morning. Encourage IS.   R rib fractures 10-11 - multimodal pain control and pulm toilet ABL anemia - s/p 1u PRBCs 12/1. Hgb 8.7, stable Right anterior acetabular rib fx - per ortho, Dr. Sherilyn Dacosta, nonop, WBAT RLE Left elbow lac - from previous fall. Sutures removed 12/7. Concern for possible infected bursitis so he was started on augmentin 12/8, if not improving may need to ask ortho to see. He also has some new swelling medial in the distal upper arm, will obtain u/s to rule out DVT Chronic A fib on xarelto - would consider holding  anticoagulation indefinitely  Chronic urinary retention - I&O caths at home. Foley placed by urology 12/2.  Prostate cancer HTN HLD   ID - none currently FEN - IVF per TRH, HH/CM diet VTE - SCDs, sq heparin Foley - placed by urology 12/2   Dispo - LUE u/s to rule out DVT as above. Chest tube as above. PT/OT.     LOS: 7 days    Franne Forts, [facility] 01/04/2022, 8:03 AM Please see Amion for pager number during day hours 7:00am-4:30pm

## 2022-01-04 NOTE — Progress Notes (Signed)
[facility]  PROGRESS NOTE  Jacob Becker RJJ:[id number] DOB: 1929-04-21 DOA: 12/28/2021 PCP: Emilio Aspen, MD   Brief HPI:   88 year old male with medical history of advanced dementia, chronic atrial fibrillation on Xarelto, hypertension, CAD, prostate cancer in remission on chronic hormone manipulation, hepatic and pancreatic cyst presented with near syncope and fall. Patient sustained rib fractures with right hemothorax, chest tube was placed.  Plan to go home when stable.  Trauma surgery following.    Subjective   Patient seen and examined, chest x-ray showed reexpansion of lung.  Chest tube in place with suction of -20 mmHg.  Still has erythema and swelling of left elbow.  Was started on Augmentin yesterday   Assessment/Plan:    Right side traumatic hemothorax -Chest tube placed by trauma surgery in the ED -General surgery following -Chest x-ray obtained today shows worsening pneumothorax -Chest tube returned to suction to -20 mmhg -Repeat chest x-ray showed reexpansion of lung with no pneumothorax -Management per general surgery  Acute blood loss anemia -Secondary to rib fracture and right-sided hemothorax -Hemoglobin is stable at 8.8 -Patient was given 1 unit PRBC -Xarelto has been hold; till okay with general surgery  Nondisplaced RIGHT anterior acetabular rim fracture.  -ortho consulted - WBAT, no surgery needed  Left elbow swelling -Concern for infectious process -Has significant erythema and edema -?  Olecranon bursitis versus gout -Continue Augmentin 1 tablet p.o. twice daily -Will consult orthopedics, called and discussed with Dr. Sherilyn Dacosta   AKI -Secondary to hypotension and dehydration -CT abdomen/ pelvis from 12/1 did not show hydronephrosis -Creatinine is down to 1.23    Hypertension -Blood pressure is stable -Continue amlodipine, metoprolol    Hyperkalemia -resolved   Lactic acidosis -Initial lactate was 4.9, repeat lactic  acid 1.4 -Likely secondary to hypoperfusion from hypotension and bradycardia and acute blood loss anemia.     Acute metabolic encephalopathy -Resolved -delirium precautions  -PRN haldol    Near syncope -Likely from combination of hypotension and bradycardia. -On most recent cardiology evaluation in April 2023, cardiology cut down patient's metoprolol to 50 mg daily and plan to further cut down patient's amlodipine as patient continues to lose weight.   - unfortunately, patient still taking same dosage of metoprolol XL 100 mg daily and amlodipine 10 mg daily. Resumed at lower dose -echo showed EF of 60 to 65%, no regional wall motion abnormalities, left atrial size was moderately dilated   Chronic A-fib -resume metoprolol -Heart rate is controlled -xarelto on hold for now   Chronic urinary retention -Has been doing self catheterizing at home -foley placed by urology -- will leave in until patient able to do own self caths   Prostate CA -In Remission, continue home hormone therapy medication   Liver cyst, pancreatic cyst -Stable on CT scan   Moderate protein calorie malnutrition with unintentional weight loss -about 15 lbs weight loss in last 6 months for poor appetite and food intake, - for which patient has been on Ensure twice daily.   Medications     acetaminophen  1,000 mg Oral TID   amLODipine  5 mg Oral Daily   amoxicillin-clavulanate  1 tablet Oral Q12H   Chlorhexidine Gluconate Cloth  6 each Topical Daily   colesevelam  1,250 mg Oral BID   docusate sodium  100 mg Oral BID   enzalutamide  160 mg Oral QHS   feeding supplement  237 mL Oral BID BM   heparin injection (subcutaneous)  5,000 Units Subcutaneous Q8H  lidocaine  1 patch Transdermal Q24H   metoprolol succinate  50 mg Oral Daily   rosuvastatin  40 mg Oral Daily   sodium chloride flush  3 mL Intravenous Q12H     Data Reviewed:   CBG:  Recent Labs  Lab 12/29/21 0510 12/30/21 0500 01/02/22 0440   GLUCAP 144* 122* 123*    SpO2: 99 %    Vitals:   01/04/22 0500 01/04/22 0522 01/04/22 0615 01/04/22 1741  BP:  (!) 151/70  124/66  Pulse:  72  61  Resp:  18  18  Temp:  98.2 F (36.8 C)  97.9 F (36.6 C)  TempSrc:  Oral  Oral  SpO2:  97%  99%  Weight: 69.9 kg  69.9 kg   Height:          Data Reviewed:  Basic Metabolic Panel: Recent Labs  Lab 12/30/21 0149 12/30/21 1031 12/31/21 0326 01/02/22 0236 01/04/22 0320  NA 139 138 134* 133* 136  K 3.4* 3.6 3.9 3.9 4.2  CL 111 111 105 105 106  CO2 22 20* 22 21* 22  GLUCOSE 137* 139* 136* 128* 112*  BUN 45* 40* 45* 36* 34*  CREATININE 1.36* 1.34* 1.28* 1.23 1.28*  CALCIUM 7.8* 8.1* 7.7* 8.3* 8.3*    CBC: Recent Labs  Lab 12/29/21 0222 12/30/21 0149 12/31/21 0326 01/02/22 0236 01/04/22 0320  WBC 10.6* 9.3 11.0* 10.1 9.6  HGB 8.4* 8.1* 8.2* 8.8* 8.7*  HCT 25.7* 24.8* 25.1* 26.0* 25.5*  MCV 94.8 95.0 95.8 94.9 95.1  PLT 129* 134* 140* 175 210    LFT No results for input(s): "AST", "ALT", "ALKPHOS", "BILITOT", "PROT", "ALBUMIN" in the last 168 hours.    Antibiotics: Anti-infectives (From admission, onward)    Start     Dose/Rate Route Frequency Ordered Stop   01/03/22 1215  amoxicillin-clavulanate (AUGMENTIN) 875-125 MG per tablet 1 tablet        1 tablet Oral Every 12 hours 01/03/22 1125     01/01/22 1345  cefdinir (OMNICEF) capsule 300 mg  Status:  Discontinued        300 mg Oral Every 12 hours 01/01/22 1259 01/03/22 1125   12/28/21 1215  cefTRIAXone (ROCEPHIN) 1 g in sodium chloride 0.9 % 100 mL IVPB  Status:  Discontinued        1 g 200 mL/hr over 30 Minutes Intravenous  Once 12/28/21 1205 12/28/21 1320   12/28/21 1215  azithromycin (ZITHROMAX) 500 mg in sodium chloride 0.9 % 250 mL IVPB  Status:  Discontinued        500 mg 250 mL/hr over 60 Minutes Intravenous  Once 12/28/21 1205 12/28/21 1320        DVT prophylaxis: Heparin  Code Status: Full code  Family Communication: Discussed with  patient's daughter on phone    CONSULTS trauma surgery   Objective    Physical Examination:  Appears in no acute distress Heart S1-S2 regular, no murmur auscultated Extremities-left elbow erythematous, nontender to palpation, edematous Lungs clear to auscultation bilaterally, right-sided chest tube in place  Status is: Inpatient:          Meredeth Ide   [facility] If 7PM-7AM, please contact night-coverage at [email or web address], Office  [phone]   01/04/2022, 5:42 PM  LOS: 7 days

## 2022-01-04 NOTE — Progress Notes (Signed)
Left upper ext venous duplex  has been completed. Refer to [facility] under chart review to view preliminary results.   01/04/2022  12:03 PM Jacob Becker, [name]

## 2022-01-04 NOTE — TOC Progression Note (Signed)
Transition of Care [facility]) - Progression Note    Patient Details  Name: GRAYSIN LUCZYNSKI MRN: [id number] Date of Birth: 04-18-1929  Transition of Care [facility]) CM/SW Contact  Carley Hammed, [place] Phone Number: 01/04/2022, 12:25 PM  Clinical Narrative:     CSW spoke with dtr, Dondra Spry and advised that the recommendations have been changed to SNF. Dtr is in agreement and bed offers provided. Family has chosen [facility], no insurance auth needed. MD confirms pt will likely be ready to DC on Monday. Facility updated. TOC will continue to follow for DC needs.  Expected Discharge Plan: Home w Home Health Services Barriers to Discharge: Continued Medical Work up  Expected Discharge Plan and Services Expected Discharge Plan: Home w Home Health Services   Discharge Planning Services: CM Consult Post Acute Care Choice: Home Health Living arrangements for the past 2 months: Single Family Home                 DME Arranged:  (awaiting decision walker vs rollator)         HH Arranged: RN, PT, OT HH Agency: Other - See comment ([facility]) Date HH Agency Contacted: 12/31/21 Time HH Agency Contacted: 1425 Representative spoke with at [facility] Agency: Eber Jones   Social Determinants of Health (SDOH) Interventions    Readmission Risk Interventions     No data to display

## 2022-01-04 NOTE — Progress Notes (Signed)
Patient ID: Jacob Becker, male   DOB: [birth year], 88 y.o.   MRN: [id number]   LOS: 7 days   Subjective: Asked to see pt for left elbow swelling and redness. Started yesterday. Mild pain but able to range without problem. Denies hx/o similar or gout.   Objective: Vital signs in last 24 hours: Temp:  [98.1 F (36.7 C)-98.5 F (36.9 C)] 98.2 F (36.8 C) (12/08 0522) Pulse Rate:  [64-72] 72 (12/08 0522) Resp:  [18-19] 18 (12/08 0522) BP: (121-151)/(62-70) 151/70 (12/08 0522) SpO2:  [97 %-98 %] 97 % (12/08 0522) Weight:  [69.9 kg] 69.9 kg (12/08 0615) Last BM Date : 01/03/22 (Per report)   Laboratory  CBC Recent Labs    01/02/22 0236 01/04/22 0320  WBC 10.1 9.6  HGB 8.8* 8.7*  HCT 26.0* 25.5*  PLT 175 210   BMET Recent Labs    01/02/22 0236 01/04/22 0320  NA 133* 136  K 3.9 4.2  CL 105 106  CO2 21* 22  GLUCOSE 128* 112*  BUN 36* 34*  CREATININE 1.23 1.28*  CALCIUM 8.3* 8.3*     Physical Exam General appearance: alert and no distress Left elbow: Boggy, minimally TTP, erythematous, normal ROM   Assessment/Plan: Left elbow bursitis -- Favor inflammatory over septic given lack of pain. Will aspirate and send for culture and crystals. ACE compression for now. WBAT.    Freeman Caldron, PA-C Orthopedic Surgery [phone] 01/04/2022

## 2022-01-05 ENCOUNTER — Inpatient Hospital Stay (HOSPITAL_COMMUNITY): Payer: Medicare Other

## 2022-01-05 DIAGNOSIS — J942 Hemothorax: Secondary | ICD-10-CM | POA: Diagnosis not present

## 2022-01-05 DIAGNOSIS — R55 Syncope and collapse: Secondary | ICD-10-CM | POA: Diagnosis not present

## 2022-01-05 DIAGNOSIS — W19XXXA Unspecified fall, initial encounter: Secondary | ICD-10-CM | POA: Diagnosis not present

## 2022-01-05 MED ORDER — ONDANSETRON HCL 4 MG/2ML IJ SOLN: 4.0000 mg | Freq: Four times a day (QID) | INTRAMUSCULAR | Status: DC | PRN

## 2022-01-05 NOTE — Progress Notes (Signed)
Subjective/Chief Complaint: Hates hospital food no other complaints    Objective: Vital signs in last 24 hours: Temp:  [97.9 F (36.6 C)-98.4 F (36.9 C)] 98 F (36.7 C) (12/09 0729) Pulse Rate:  [61-80] 80 (12/09 0729) Resp:  [14-18] 14 (12/09 0729) BP: (124-135)/(66-76) 132/73 (12/09 0729) SpO2:  [99 %] 99 % (12/09 0729) Weight:  [66.9 kg] 66.9 kg (12/09 0419) Last BM Date : 01/04/22  Intake/Output from previous day: 12/08 0701 - 12/09 0700 In: 1200 [P.O.:1200] Out: 1686 [Urine:1150; Stool:1; Chest Tube:535] Intake/Output this shift: Total I/O In: 360 [P.O.:360] Out: 0    Gen:  Alert, NAD Card:  normal heart rate Pulm:  CTAB, no W/R/R, rate and effort normal on room air. Right chest tube without air leak. serosanguinous drainage in [place] 535 cc serous  Abd: Soft, NT/ND Psych: Alert, oriented to self only  Skin: no rashes noted, warm and dry Ext:  Left elbow lac pictured below with scabbing, mild erythema and some fluctuance- feels like a small effusion over the elbow - no purulent drainage or warmth, he does have some mild edema extending into the distal/medial upper arm. Lab Results:  Recent Labs    01/04/22 0320  WBC 9.6  HGB 8.7*  HCT 25.5*  PLT 210   BMET Recent Labs    01/04/22 0320  NA 136  K 4.2  CL 106  CO2 22  GLUCOSE 112*  BUN 34*  CREATININE 1.28*  CALCIUM 8.3*   PT/INR No results for input(s): "LABPROT", "INR" in the last 72 hours. ABG No results for input(s): "PHART", "HCO3" in the last 72 hours.  Invalid input(s): "PCO2", "PO2"  Studies/Results: VAS [place] UPPER EXTREMITY VENOUS DUPLEX  Result Date: 01/04/2022 UPPER VENOUS STUDY  Patient Name:  MOISES KOLTON  Date of Exam:   01/04/2022 Medical Rec #: [id number]         Accession #:    [id number] Date of Birth: February 08, 1929         Patient Gender: M Patient Age:   88 years Exam Location:  [facility] Procedure:      VAS [place] UPPER EXTREMITY VENOUS DUPLEX Referring Phys: Carlena Bjornstad --------------------------------------------------------------------------------  Indications: Swelling, Edema, and Pain Other Indications: Patient fell. Anticoagulation: Xarelto. Comparison Study: No priors. Performing Technologist: Marilynne Halsted RDMS, RVT  Examination Guidelines: A complete evaluation includes B-mode imaging, spectral Doppler, color Doppler, and power Doppler as needed of all accessible portions of each vessel. Bilateral testing is considered an integral part of a complete examination. Limited examinations for reoccurring indications may be performed as noted.  Right Findings: +----------+------------+---------+-----------+----------+-------+ RIGHT     CompressiblePhasicitySpontaneousPropertiesSummary +----------+------------+---------+-----------+----------+-------+ Subclavian               Yes       Yes                      +----------+------------+---------+-----------+----------+-------+  Left Findings: +----------+------------+---------+-----------+----------+-------+ LEFT      CompressiblePhasicitySpontaneousPropertiesSummary +----------+------------+---------+-----------+----------+-------+ IJV           Full       Yes       Yes                      +----------+------------+---------+-----------+----------+-------+ Subclavian    Full       Yes       Yes                      +----------+------------+---------+-----------+----------+-------+  Axillary      Full       Yes       Yes                      +----------+------------+---------+-----------+----------+-------+ Brachial      Full                                          +----------+------------+---------+-----------+----------+-------+ Radial        Full                                          +----------+------------+---------+-----------+----------+-------+ Ulnar         Full                                           +----------+------------+---------+-----------+----------+-------+ Cephalic      Full                                          +----------+------------+---------+-----------+----------+-------+ Basilic       Full                                          +----------+------------+---------+-----------+----------+-------+  Summary:  Right: No evidence of thrombosis in the subclavian.  Left: No evidence of deep vein thrombosis in the upper extremity. No evidence of superficial vein thrombosis in the upper extremity.  *See table(s) above for measurements and observations.  Diagnosing physician: Lemar Livings MD Electronically signed by Lemar Livings MD on 01/04/2022 at 5:03:38 PM.    Final    DG CHEST PORT 1 VIEW  Result Date: 01/04/2022 CLINICAL DATA:  Chest tube in place. EXAM: PORTABLE CHEST 1 VIEW COMPARISON:  January 03, 2022. FINDINGS: Stable cardiomediastinal silhouette. Right-sided chest tube is unchanged in position. No definite pneumothorax is noted. Stable mild right basilar atelectasis. IMPRESSION: Stable position of right-sided chest tube without definite pneumothorax. Electronically Signed   By: Lupita Raider M.D.   On: 01/04/2022 08:24   DG CHEST PORT 1 VIEW  Result Date: 01/03/2022 CLINICAL DATA:  Pneumothorax post chest tube placement. EXAM: PORTABLE CHEST 1 VIEW COMPARISON:  January 03, 2022 FINDINGS: Re-expansion of the RIGHT lung. Suspect advancement also of RIGHT-sided chest tube tip near the lateral margin of the RIGHT fifth rib. No visible pleural line to indicate persistent pneumothorax in the RIGHT chest. Cardiomediastinal contours and hilar structures are stable. EKG leads project over the chest.  LEFT chest is clear. On limited assessment no acute skeletal process. IMPRESSION: Re-expansion of the RIGHT lung without current evidence of visible pneumothorax. Chest tube may have been repositioned as well since previous imaging. Electronically Signed   By: Donzetta Kohut M.D.    On: 01/03/2022 15:00    Anti-infectives: Anti-infectives (From admission, onward)    Start     Dose/Rate Route Frequency Ordered Stop   01/03/22 1215  amoxicillin-clavulanate (AUGMENTIN) 875-125 MG per tablet 1 tablet  1 tablet Oral Every 12 hours 01/03/22 1125     01/01/22 1345  cefdinir (OMNICEF) capsule 300 mg  Status:  Discontinued        300 mg Oral Every 12 hours 01/01/22 1259 01/03/22 1125   12/28/21 1215  cefTRIAXone (ROCEPHIN) 1 g in sodium chloride 0.9 % 100 mL IVPB  Status:  Discontinued        1 g 200 mL/hr over 30 Minutes Intravenous  Once 12/28/21 1205 12/28/21 1320   12/28/21 1215  azithromycin (ZITHROMAX) 500 mg in sodium chloride 0.9 % 250 mL IVPB  Status:  Discontinued        500 mg 250 mL/hr over 60 Minutes Intravenous  Once 12/28/21 1205 12/28/21 1320       Assessment/Plan: Fall on xarelto   R hemothorax - s/p chest tube placement 12/2. Continue CT to -20 cm suction today and repeat CXR in the morning. Encourage IS.   R rib fractures 10-11 - multimodal pain control and pulm toilet ABL anemia - s/p 1u PRBCs 12/1. Hgb 8.7, stable Right anterior acetabular rib fx - per ortho, Dr. Sherilyn Dacosta, nonop, WBAT RLE Left elbow lac - from previous fall. Sutures removed 12/7. Concern for possible infected bursitis so he was started on augmentin 12/8, if not improving may need to ask ortho to see. DVT negative  Chronic A fib on xarelto - would consider holding anticoagulation indefinitely  Chronic urinary retention - I&O caths at home. Foley placed by urology 12/2.  Prostate cancer HTN HLD   ID - none currently FEN - IVF per TRH, HH/CM diet VTE - SCDs, sq heparin Foley - placed by urology 12/2   Dispo - LUE u/s to rule out DVT as above. Chest tube as above. PT/O   LOS: 8 days    Dortha Schwalbe MD   Total time 20 minutes  01/05/2022

## 2022-01-05 NOTE — Progress Notes (Signed)
[facility]  PROGRESS NOTE  Jacob Becker:[id number] DOB: 1929/05/21 DOA: 12/28/2021 PCP: Emilio Aspen, MD   Brief HPI:   88 year old male with medical history of advanced dementia, chronic atrial fibrillation on Xarelto, hypertension, CAD, prostate cancer in remission on chronic hormone manipulation, hepatic and pancreatic cyst presented with near syncope and fall. Patient sustained rib fractures with right hemothorax, chest tube was placed.  Plan to go home when stable.  Trauma surgery following.    Subjective   Patient seen and examined, underwent aspiration of left elbow swelling per orthopedics yesterday.  Aspirate showed neutrophils.  Patient is on Augmentin.  Chest tube has been changed to waterseal.   Assessment/Plan:    Right side traumatic hemothorax -Chest tube placed by trauma surgery in the ED -General surgery following -Chest x-ray obtained today shows worsening pneumothorax -Chest tube returned to suction to -20 mmhg; now changed to waterseal -Repeat chest x-ray showed reexpansion of lung with no pneumothorax -Management per general surgery  Acute blood loss anemia -Secondary to rib fracture and right-sided hemothorax -Hemoglobin is stable at 8.8 -Patient was given 1 unit PRBC -Xarelto has been hold; till okay with general surgery  Nondisplaced RIGHT anterior acetabular rim fracture.  -ortho consulted - WBAT, no surgery needed  Left elbow swelling -Concern for infectious process -Has significant erythema and edema -?  Olecranon bursitis versus gout -Continue Augmentin 1 tablet p.o. twice daily -Orthopedic surgery was consulted yesterday, left elbow swelling was aspirated -Aspirate analysis showed 99% neutrophils, no crystals seen -Discussed with orthopedics, no further aspiration required.  Will continue with Augmentin.   AKI -Secondary to hypotension and dehydration -CT abdomen/ pelvis from 12/1 did not show  hydronephrosis -Creatinine is 1.28 today    Hypertension -Blood pressure is stable -Continue amlodipine, metoprolol    Hyperkalemia -resolved   Lactic acidosis -Initial lactate was 4.9, repeat lactic acid 1.4 -Likely secondary to hypoperfusion from hypotension and bradycardia and acute blood loss anemia.     Acute metabolic encephalopathy -Resolved -delirium precautions  -PRN haldol    Near syncope -Likely from combination of hypotension and bradycardia. -On most recent cardiology evaluation in April 2023, cardiology cut down patient's metoprolol to 50 mg daily and plan to further cut down patient's amlodipine as patient continues to lose weight.   - unfortunately, patient still taking same dosage of metoprolol XL 100 mg daily and amlodipine 10 mg daily. Resumed at lower dose -echo showed EF of 60 to 65%, no regional wall motion abnormalities, left atrial size was moderately dilated   Chronic A-fib -resume metoprolol -Heart rate is controlled -xarelto on hold for now   Chronic urinary retention -Has been doing self catheterizing at home -foley placed by urology -- will leave in until patient able to do own self caths   Prostate CA -In Remission, continue home hormone therapy medication   Liver cyst, pancreatic cyst -Stable on CT scan   Moderate protein calorie malnutrition with unintentional weight loss -about 15 lbs weight loss in last 6 months for poor appetite and food intake, - for which patient has been on Ensure twice daily.   Medications     acetaminophen  1,000 mg Oral TID   amLODipine  5 mg Oral Daily   amoxicillin-clavulanate  1 tablet Oral Q12H   Chlorhexidine Gluconate Cloth  6 each Topical Daily   colesevelam  1,250 mg Oral BID   docusate sodium  100 mg Oral BID   enzalutamide  160 mg Oral QHS  feeding supplement  237 mL Oral BID BM   heparin injection (subcutaneous)  5,000 Units Subcutaneous Q8H   lidocaine  1 patch Transdermal Q24H    metoprolol succinate  50 mg Oral Daily   rosuvastatin  40 mg Oral Daily   sodium chloride flush  3 mL Intravenous Q12H     Data Reviewed:   CBG:  Recent Labs  Lab 12/30/21 0500 01/02/22 0440  GLUCAP 122* 123*    SpO2: 99 %    Vitals:   01/04/22 1741 01/04/22 2015 01/05/22 0419 01/05/22 0729  BP: 124/66 131/69 135/76 132/73  Pulse: 61 70 73 80  Resp: 18 18 18 14   Temp: 97.9 F (36.6 C) 98.2 F (36.8 C) 98.4 F (36.9 C) 98 F (36.7 C)  TempSrc: Oral Oral Oral Oral  SpO2: 99% 99% 99% 99%  Weight:   66.9 kg   Height:          Data Reviewed:  Basic Metabolic Panel: Recent Labs  Lab 12/30/21 0149 12/30/21 1031 12/31/21 0326 01/02/22 0236 01/04/22 0320  NA 139 138 134* 133* 136  K 3.4* 3.6 3.9 3.9 4.2  CL 111 111 105 105 106  CO2 22 20* 22 21* 22  GLUCOSE 137* 139* 136* 128* 112*  BUN 45* 40* 45* 36* 34*  CREATININE 1.36* 1.34* 1.28* 1.23 1.28*  CALCIUM 7.8* 8.1* 7.7* 8.3* 8.3*    CBC: Recent Labs  Lab 12/30/21 0149 12/31/21 0326 01/02/22 0236 01/04/22 0320  WBC 9.3 11.0* 10.1 9.6  HGB 8.1* 8.2* 8.8* 8.7*  HCT 24.8* 25.1* 26.0* 25.5*  MCV 95.0 95.8 94.9 95.1  PLT 134* 140* 175 210    LFT No results for input(s): "AST", "ALT", "ALKPHOS", "BILITOT", "PROT", "ALBUMIN" in the last 168 hours.    Antibiotics: Anti-infectives (From admission, onward)    Start     Dose/Rate Route Frequency Ordered Stop   01/03/22 1215  amoxicillin-clavulanate (AUGMENTIN) 875-125 MG per tablet 1 tablet        1 tablet Oral Every 12 hours 01/03/22 1125     01/01/22 1345  cefdinir (OMNICEF) capsule 300 mg  Status:  Discontinued        300 mg Oral Every 12 hours 01/01/22 1259 01/03/22 1125   12/28/21 1215  cefTRIAXone (ROCEPHIN) 1 g in sodium chloride 0.9 % 100 mL IVPB  Status:  Discontinued        1 g 200 mL/hr over 30 Minutes Intravenous  Once 12/28/21 1205 12/28/21 1320   12/28/21 1215  azithromycin (ZITHROMAX) 500 mg in sodium chloride 0.9 % 250 mL IVPB  Status:   Discontinued        500 mg 250 mL/hr over 60 Minutes Intravenous  Once 12/28/21 1205 12/28/21 1320        DVT prophylaxis: Heparin  Code Status: Full code  Family Communication: Discussed with patient's daughter on phone    CONSULTS trauma surgery   Objective    Physical Examination:  General-appears in no acute distress Heart-S1-S2, regular, no murmur auscultated Lungs-clear to auscultation bilaterally, chest tube in place Abdomen-soft, nontender, no organomegaly Extremities-left elbow erythematous with pus draining from open area Neuro-alert, oriented x3, no focal deficit noted  Status is: Inpatient:          Meredeth Ide   [facility] If 7PM-7AM, please contact night-coverage at [email or web address], Office  [phone]   01/05/2022, 11:17 AM  LOS: 8 days

## 2022-01-05 NOTE — Progress Notes (Signed)
Assisted pt from chair to bed. Bed alarm  and bilateral mitts in place. Pt sleeping, respirations even and unlabored with no distress noted. CT remains patent and to water seal.

## 2022-01-05 NOTE — Progress Notes (Signed)
Secure chat with Tiffanie RN and Dr Sharl Ma re PIV consult.  No IV meds needed at this time and has been days since prn medications needed.  Dr Sharl Ma states ok to leave PIV out at this time.  RN aware to place consult if IV needs change.

## 2022-01-05 NOTE — Progress Notes (Signed)
I was contacted regarding Mr. Jacob Becker, with regards to his left elbow.  As a reminder, he was seen yesterday evening by Jacob Hamburg, PA-C.  He did evaluate the patient, and did diagnose him with bursitis.  He did aspirate the bursa, and did recommend a compressive wrap, with an Ace bandage.  We were recontacted today as the fluid analysis was noted to have a high neutrophil count.  I did reach out to Jacob Becker, and did confirm that the fluid was fluid from the olecranon bursa, and not from the elbow joint.  Jacob Becker did point out that the patient's symptoms had been minimal, with minimal pain at rest, and with range of motion of the left elbow.  He did feel it would be best to continue treating the patient with antibiotics and a compressive wrap.  This is also my recommendation, as this is a bursitis, and not septic arthritis.  I therefore do recommend local wound care, a compressive dressing, and antibiotics.  If this does not result in improvement of the patient's symptoms, a bursectomy can be entertained in the future, but I do not feel that is indicated at this particular juncture.

## 2022-01-06 ENCOUNTER — Inpatient Hospital Stay (HOSPITAL_COMMUNITY): Payer: Medicare Other

## 2022-01-06 DIAGNOSIS — J942 Hemothorax: Secondary | ICD-10-CM | POA: Diagnosis not present

## 2022-01-06 DIAGNOSIS — W19XXXA Unspecified fall, initial encounter: Secondary | ICD-10-CM | POA: Diagnosis not present

## 2022-01-06 DIAGNOSIS — M7022 Olecranon bursitis, left elbow: Secondary | ICD-10-CM

## 2022-01-06 DIAGNOSIS — R55 Syncope and collapse: Secondary | ICD-10-CM | POA: Diagnosis not present

## 2022-01-06 LAB — CBC
HCT: 25.7 % — ABNORMAL LOW (ref 39.0–52.0)
Hemoglobin: 8.5 g/dL — ABNORMAL LOW (ref 13.0–17.0)
MCH: 31.8 pg (ref 26.0–34.0)
MCHC: 33.1 g/dL (ref 30.0–36.0)
MCV: 96.3 fL (ref 80.0–100.0)
Platelets: 233 10*3/uL (ref 150–400)
RBC: 2.67 MIL/uL — ABNORMAL LOW (ref 4.22–5.81)
RDW: 14.6 % (ref 11.5–15.5)
WBC: 9.4 10*3/uL (ref 4.0–10.5)
nRBC: 0 % (ref 0.0–0.2)

## 2022-01-06 LAB — GLUCOSE, CAPILLARY: Glucose-Capillary: 111 mg/dL — ABNORMAL HIGH (ref 70–99)

## 2022-01-06 NOTE — Progress Notes (Signed)
Patient ID: Jacob Becker, male   DOB: [birth year], 88 y.o.   MRN: [id number] Atrium knocked over while moving the patient.No air leak noted. Replacement atrium ordered from supply. Charge nurse and MD notified.  Lidia Collum, RN

## 2022-01-06 NOTE — Progress Notes (Signed)
Subjective/Chief Complaint: Denies any pain.  Eating breakfast.  Denies chest pain or shortness of breath.   Objective: Vital signs in last 24 hours: Temp:  [98 F (36.7 C)-98.4 F (36.9 C)] 98.4 F (36.9 C) (12/09 2231) Pulse Rate:  [74-82] 82 (12/09 2231) Resp:  [18-20] 18 (12/09 2231) BP: (117-119)/(61-66) 117/66 (12/09 2231) SpO2:  [97 %] 97 % (12/09 2231) Weight:  [67.5 kg] 67.5 kg (12/10 0500) Last BM Date : 01/05/22  Intake/Output from previous day: 12/09 0701 - 12/10 0700 In: 840 [P.O.:840] Out: 980 [Urine:850; Chest Tube:130] Intake/Output this shift: No intake/output data recorded.   Gen:  Alert, NAD Card:  normal heart rate Pulm:  CTAB, no W/R/R, rate and effort normal on room air. Right chest tube without air leak. serosanguinous drainage in [place] 110  cc serous  Abd: Soft, NT/ND Psych: Alert, oriented to self only  Skin: no rashes noted, warm and dry Ext:  Left elbow lac pictured below with scabbing, mild erythema and some fluctuance- feels like a small effusion over the elbow - no purulent drainage or warmth, he does have some mild edema extending into the distal/medial upper arm. Lab Results:  Recent Labs    01/04/22 0320 01/06/22 0424  WBC 9.6 9.4  HGB 8.7* 8.5*  HCT 25.5* 25.7*  PLT 210 233   BMET Recent Labs    01/04/22 0320  NA 136  K 4.2  CL 106  CO2 22  GLUCOSE 112*  BUN 34*  CREATININE 1.28*  CALCIUM 8.3*   PT/INR No results for input(s): "LABPROT", "INR" in the last 72 hours. ABG No results for input(s): "PHART", "HCO3" in the last 72 hours.  Invalid input(s): "PCO2", "PO2"  Studies/Results: DG Chest Port 1 View  Result Date: 01/06/2022 CLINICAL DATA:  Pneumothorax follow-up EXAM: PORTABLE CHEST 1 VIEW COMPARISON:  01/05/2022 FINDINGS: Unchanged right chest tube positioning. Small right apical pneumothorax. Hazy density at the right base with volume loss attributed to atelectasis and pleural fluid. Stable heart size and  mediastinal contours. IMPRESSION: 1. Small right apical pneumothorax. Unchanged chest tube positioning. 2. Increased atelectasis at the right base. Electronically Signed   By: Tiburcio Pea M.D.   On: 01/06/2022 07:20   DG CHEST PORT 1 VIEW  Result Date: 01/05/2022 CLINICAL DATA:  Chest tube in place EXAM: PORTABLE CHEST 1 VIEW COMPARISON:  01/04/2022. FINDINGS: Right-sided chest pain laterally. No pneumothorax identified. Alveolar density right base could represent some volume loss, layering effusion or consolidation. Normal pulmonary vasculature. Calcified aorta. IMPRESSION: Right-sided chest tube in place. No pneumothorax identified. Right basilar alveolar density consistent with volume loss, consolidation or layering effusion. Electronically Signed   By: Layla Maw M.D.   On: 01/05/2022 09:37   VAS [place] UPPER EXTREMITY VENOUS DUPLEX  Result Date: 01/04/2022 UPPER VENOUS STUDY  Patient Name:  Jacob Becker  Date of Exam:   01/04/2022 Medical Rec #: [id number]         Accession #:    [id number] Date of Birth: 1929/02/22         Patient Gender: M Patient Age:   13 years Exam Location:  [facility] Procedure:      VAS [place] UPPER EXTREMITY VENOUS DUPLEX Referring Phys: Carlena Bjornstad --------------------------------------------------------------------------------  Indications: Swelling, Edema, and Pain Other Indications: Patient fell. Anticoagulation: Xarelto. Comparison Study: No priors. Performing Technologist: Marilynne Halsted RDMS, RVT  Examination Guidelines: A complete evaluation includes B-mode imaging, spectral Doppler, color Doppler, and power Doppler as needed of all  accessible portions of each vessel. Bilateral testing is considered an integral part of a complete examination. Limited examinations for reoccurring indications may be performed as noted.  Right Findings: +----------+------------+---------+-----------+----------+-------+ RIGHT      CompressiblePhasicitySpontaneousPropertiesSummary +----------+------------+---------+-----------+----------+-------+ Subclavian               Yes       Yes                      +----------+------------+---------+-----------+----------+-------+  Left Findings: +----------+------------+---------+-----------+----------+-------+ LEFT      CompressiblePhasicitySpontaneousPropertiesSummary +----------+------------+---------+-----------+----------+-------+ IJV           Full       Yes       Yes                      +----------+------------+---------+-----------+----------+-------+ Subclavian    Full       Yes       Yes                      +----------+------------+---------+-----------+----------+-------+ Axillary      Full       Yes       Yes                      +----------+------------+---------+-----------+----------+-------+ Brachial      Full                                          +----------+------------+---------+-----------+----------+-------+ Radial        Full                                          +----------+------------+---------+-----------+----------+-------+ Ulnar         Full                                          +----------+------------+---------+-----------+----------+-------+ Cephalic      Full                                          +----------+------------+---------+-----------+----------+-------+ Basilic       Full                                          +----------+------------+---------+-----------+----------+-------+  Summary:  Right: No evidence of thrombosis in the subclavian.  Left: No evidence of deep vein thrombosis in the upper extremity. No evidence of superficial vein thrombosis in the upper extremity.  *See table(s) above for measurements and observations.  Diagnosing physician: Lemar Livings MD Electronically signed by Lemar Livings MD on 01/04/2022 at 5:03:38 PM.    Final      Anti-infectives: Anti-infectives (From admission, onward)    Start     Dose/Rate Route Frequency Ordered Stop   01/03/22 1215  amoxicillin-clavulanate (AUGMENTIN) 875-125 MG per tablet 1 tablet        1 tablet Oral Every 12 hours 01/03/22 1125     01/01/22 1345  cefdinir (OMNICEF)  capsule 300 mg  Status:  Discontinued        300 mg Oral Every 12 hours 01/01/22 1259 01/03/22 1125   12/28/21 1215  cefTRIAXone (ROCEPHIN) 1 g in sodium chloride 0.9 % 100 mL IVPB  Status:  Discontinued        1 g 200 mL/hr over 30 Minutes Intravenous  Once 12/28/21 1205 12/28/21 1320   12/28/21 1215  azithromycin (ZITHROMAX) 500 mg in sodium chloride 0.9 % 250 mL IVPB  Status:  Discontinued        500 mg 250 mL/hr over 60 Minutes Intravenous  Once 12/28/21 1205 12/28/21 1320       Assessment/Plan: Fall on xarelto   R hemothorax - s/p chest tube placement 12/2.  Replace CT to -20 cm suction today and repeat CXR in the morning. Encourage IS.  Pneumothorax noted which is small on the right while on waterseal.  No symptoms.  No airleak. R rib fractures 10-11 - multimodal pain control and pulm toilet ABL anemia - s/p 1u PRBCs 12/1. Hgb 8.7, stable Right anterior acetabular rib fx - per ortho, Dr. Sherilyn Dacosta, nonop, WBAT RLE Left elbow lac - from previous fall. Sutures removed 12/7.  Cellulitis improved  Chronic A fib on xarelto - would consider holding anticoagulation indefinitely  Chronic urinary retention - I&O caths at home. Foley placed by urology 12/2.  Prostate cancer HTN HLD   ID - none currently FEN - IVF per [facility], HH/CM diet VTE - SCDs, sq heparin Foley - placed by urology 12/2   LOS: 9 days    Clovis Pu Reather Steller MD 01/06/2022 Total time 20 minutes

## 2022-01-06 NOTE — Progress Notes (Addendum)
[facility]  PROGRESS NOTE  Jacob Becker QMV:[id number] DOB: [birth year] DOA: 12/28/2021 PCP: Emilio Aspen, MD   Brief HPI:   88 year old male with medical history of advanced dementia, chronic atrial fibrillation on Xarelto, hypertension, CAD, prostate cancer in remission on chronic hormone manipulation, hepatic and pancreatic cyst presented with near syncope and fall. Patient sustained rib fractures with right hemothorax, chest tube was placed.  Plan to go home when stable.  Trauma surgery following.    Subjective   Patient seen and examined, chest x-ray this morning again shows small right pneumothorax.  Chest tube has been placed to -20 cm suction again.   Assessment/Plan:    Right side traumatic hemothorax -Chest tube placed by trauma surgery in the ED -General surgery following -Chest x-ray obtained today showed small apical pneumothorax -Chest tube return to suction with -20 cm -Management per general surgery  Acute blood loss anemia -Secondary to rib fracture and right-sided hemothorax -Hemoglobin is stable at 8.5 -Patient was given 1 unit PRBC -Xarelto has been hold; till okay with general surgery  Nondisplaced RIGHT anterior acetabular rim fracture.  -ortho consulted - WBAT, no surgery needed  Left elbow swelling -Concern for infectious process -Has significant erythema and edema -?  Olecranon bursitis versus gout -Continue Augmentin 1 tablet p.o. twice daily -Orthopedic surgery was consulted yesterday, left elbow swelling was aspirated -Aspirate analysis showed 99% neutrophils, no crystals seen -Discussed with orthopedics, no further aspiration required.  Will continue with Augmentin.   AKI -Secondary to hypotension and dehydration -CT abdomen/ pelvis from 12/1 did not show hydronephrosis -Creatinine is 1.28 today    Hypertension -Blood pressure is stable -Continue amlodipine, metoprolol    Hyperkalemia -resolved   Lactic  acidosis -Initial lactate was 4.9, repeat lactic acid 1.4 -Likely secondary to hypoperfusion from hypotension and bradycardia and acute blood loss anemia.     Acute metabolic encephalopathy -Resolved -delirium precautions  -PRN haldol    Near syncope -Likely from combination of hypotension and bradycardia. -On most recent cardiology evaluation in April 2023, cardiology cut down patient's metoprolol to 50 mg daily and plan to further cut down patient's amlodipine as patient continues to lose weight.   - unfortunately, patient still taking same dosage of metoprolol XL 100 mg daily and amlodipine 10 mg daily. Resumed at lower dose -echo showed EF of 60 to 65%, no regional wall motion abnormalities, left atrial size was moderately dilated   Chronic A-fib -resume metoprolol -Heart rate is controlled -xarelto on hold for now; trauma surgery recommends to hold it indefinitely   Chronic urinary retention -Has been doing self catheterizing at home -foley placed by urology -- will leave in until patient able to do own self caths   Prostate CA -In Remission, continue home hormone therapy medication   Liver cyst, pancreatic cyst -Stable on CT scan   Moderate protein calorie malnutrition with unintentional weight loss -about 15 lbs weight loss in last 6 months for poor appetite and food intake, - for which patient has been on Ensure twice daily.   Medications     acetaminophen  1,000 mg Oral TID   amLODipine  5 mg Oral Daily   amoxicillin-clavulanate  1 tablet Oral Q12H   Chlorhexidine Gluconate Cloth  6 each Topical Daily   colesevelam  1,250 mg Oral BID   docusate sodium  100 mg Oral BID   enzalutamide  160 mg Oral QHS   feeding supplement  237 mL Oral BID BM   heparin injection (  subcutaneous)  5,000 Units Subcutaneous Q8H   lidocaine  1 patch Transdermal Q24H   metoprolol succinate  50 mg Oral Daily   rosuvastatin  40 mg Oral Daily   sodium chloride flush  3 mL Intravenous  Q12H     Data Reviewed:   CBG:  Recent Labs  Lab 01/02/22 0440 01/06/22 0533  GLUCAP 123* 111*    SpO2: 95 %    Vitals:   01/05/22 1817 01/05/22 2231 01/06/22 0500 01/06/22 0959  BP: 119/61 117/66  132/81  Pulse: 74 82  95  Resp: 20 18  17   Temp: 98 F (36.7 C) 98.4 F (36.9 C)  98.5 F (36.9 C)  TempSrc: Oral Oral  Oral  SpO2: 97% 97%  95%  Weight:   67.5 kg   Height:          Data Reviewed:  Basic Metabolic Panel: Recent Labs  Lab 12/31/21 0326 01/02/22 0236 01/04/22 0320  NA 134* 133* 136  K 3.9 3.9 4.2  CL 105 105 106  CO2 22 21* 22  GLUCOSE 136* 128* 112*  BUN 45* 36* 34*  CREATININE 1.28* 1.23 1.28*  CALCIUM 7.7* 8.3* 8.3*    CBC: Recent Labs  Lab 12/31/21 0326 01/02/22 0236 01/04/22 0320 01/06/22 0424  WBC 11.0* 10.1 9.6 9.4  HGB 8.2* 8.8* 8.7* 8.5*  HCT 25.1* 26.0* 25.5* 25.7*  MCV 95.8 94.9 95.1 96.3  PLT 140* 175 210 233    LFT No results for input(s): "AST", "ALT", "ALKPHOS", "BILITOT", "PROT", "ALBUMIN" in the last 168 hours.    Antibiotics: Anti-infectives (From admission, onward)    Start     Dose/Rate Route Frequency Ordered Stop   01/03/22 1215  amoxicillin-clavulanate (AUGMENTIN) 875-125 MG per tablet 1 tablet        1 tablet Oral Every 12 hours 01/03/22 1125     01/01/22 1345  cefdinir (OMNICEF) capsule 300 mg  Status:  Discontinued        300 mg Oral Every 12 hours 01/01/22 1259 01/03/22 1125   12/28/21 1215  cefTRIAXone (ROCEPHIN) 1 g in sodium chloride 0.9 % 100 mL IVPB  Status:  Discontinued        1 g 200 mL/hr over 30 Minutes Intravenous  Once 12/28/21 1205 12/28/21 1320   12/28/21 1215  azithromycin (ZITHROMAX) 500 mg in sodium chloride 0.9 % 250 mL IVPB  Status:  Discontinued        500 mg 250 mL/hr over 60 Minutes Intravenous  Once 12/28/21 1205 12/28/21 1320        DVT prophylaxis: Heparin  Code Status: Full code  Family Communication: Discussed with patient's daughter at bedside   CONSULTS  trauma surgery   Objective    Physical Examination:   General-appears in no acute distress Heart-S1-S2, regular, no murmur auscultated Lungs-clear to auscultation bilaterally, chest tube in place Abdomen-soft, nontender, no organomegaly Extremities-left elbow in dressing Neuro-alert, oriented x3, no focal deficit noted  Status is: Inpatient:          Meredeth Ide   [facility] If 7PM-7AM, please contact night-coverage at [email or web address], Office  [phone]   01/06/2022, 10:51 AM  LOS: 9 days

## 2022-01-06 NOTE — Progress Notes (Signed)
     Jacob Becker is a 88 y.o. male   Orthopaedic diagnosis: Left olecranon bursitis  Subjective: Patient comfortable in bed.  He is eating.  He reports minimal pain in his left elbow.  He is status post left elbow bursa aspiration on 01/04/2022 showed elevated neutrophils.  He is on Augmentin.  Denies any fevers.  Family at bedside.  Objective: Vitals:   01/05/22 2231 01/06/22 0959  BP: 117/66 132/81  Pulse: 82 95  Resp: 18 17  Temp: 98.4 F (36.9 C) 98.5 F (36.9 C)  SpO2: 97% 95%     Exam: Awake and alert Respirations even and unlabored No acute distress  Left elbow with dressing removed shows focal erythema in the olecranon bursal region.  There is bogginess and mild complaints of pain by the patient to palpation.  Family at bedside notes improvement in the erythema.  Small scabs and 1 shallow open wound with apparent granulation tissue throughout.  Mild serosanguineous drainage on the dressing, but no purulence.  Has good range of motion and strength at the elbow without significant pain.  Neurovascular intact distally.  Assessment: Left olecranon bursitis  Plan: Patient appears to have improvement in the elbow following bursa aspiration 12/8 and antibiotics.  There is no indication for repeat aspiration or other orthopedic intervention at this time as he is improving. Recommend continuing antibiotics, compressive Ace wrap, dressing changes, and avoiding pressure on the elbow.  Plan was discussed with family at bedside who voiced understanding and agreement.     Jacob Jeudy J. [place], PA-C

## 2022-01-07 ENCOUNTER — Inpatient Hospital Stay (HOSPITAL_COMMUNITY): Payer: Medicare Other

## 2022-01-07 DIAGNOSIS — R55 Syncope and collapse: Secondary | ICD-10-CM | POA: Diagnosis not present

## 2022-01-07 DIAGNOSIS — J942 Hemothorax: Secondary | ICD-10-CM | POA: Diagnosis not present

## 2022-01-07 DIAGNOSIS — W19XXXA Unspecified fall, initial encounter: Secondary | ICD-10-CM | POA: Diagnosis not present

## 2022-01-07 DIAGNOSIS — M7022 Olecranon bursitis, left elbow: Secondary | ICD-10-CM | POA: Diagnosis not present

## 2022-01-07 LAB — BODY FLUID CULTURE W GRAM STAIN

## 2022-01-07 MED ORDER — SULFAMETHOXAZOLE-TRIMETHOPRIM 800-160 MG PO TABS
1.0000 | ORAL_TABLET | Freq: Two times a day (BID) | ORAL | Status: DC
  Administered 2022-01-07 – 2022-01-08 (×3): 1 via ORAL
  Filled 2022-01-07 (×3): qty 1

## 2022-01-07 NOTE — Progress Notes (Signed)
Occupational Therapy Treatment Patient Details Name: Jacob Becker MRN: [id number] DOB: 07/19/1929 Today's Date: 01/07/2022   History of present illness 88 y.o. male presents to [facility] hospital on 12/28/2021 after near syncope and a fall. Pt found to have R hemothorax, R rib 10-11 fx, R nondisplaced anterior wall acetabular fx. PMH includes dementia, afib, HTN, CAD, prostate CA.   OT comments  Patient eager to get OOB and participate with therapy. Patient making good gains with dressing requiring setup to donn gown on back and min guard when standing to pull up clothing. Patient able to perform grooming standing at sink from seated. Patient able to give correct date but required cues for safety and hand placement and often repeated questions once answered. Patient discharge recommendations have changed from [facility] to SNF to provided further skilled therapy to address patient needs for self care retraining. Acute OT to continue to follow.    Recommendations for follow up therapy are one component of a multi-disciplinary discharge planning process, led by the attending physician.  Recommendations may be updated based on patient status, additional functional criteria and insurance authorization.    Follow Up Recommendations  Skilled nursing-short term rehab (<3 hours/day)     Assistance Recommended at Discharge Frequent or constant Supervision/Assistance  Patient can return home with the following  A little help with walking and/or transfers;A little help with bathing/dressing/bathroom;Help with stairs or ramp for entrance;Assistance with cooking/housework;Assist for transportation;Direct supervision/assist for medications management;Direct supervision/assist for financial management   Equipment Recommendations  None recommended by OT    Recommendations for Other Services      Precautions / Restrictions Precautions Precautions: Fall Precaution Comments: chest tube, dementia related confusion,  sitter monitoring Restrictions Weight Bearing Restrictions: No       Mobility Bed Mobility Overal bed mobility: Needs Assistance Bed Mobility: Supine to Sit     Supine to sit: Min assist, HOB elevated     General bed mobility comments: increased time and assitance to raise trunk    Transfers Overall transfer level: Needs assistance Equipment used: Rolling walker (2 wheels) Transfers: Sit to/from Stand, Bed to chair/wheelchair/BSC Sit to Stand: Min assist     Step pivot transfers: Min guard     General transfer comment: min assist and cues for hand placement for sit to stands and min guard once up     Balance Overall balance assessment: Needs assistance Sitting-balance support: No upper extremity supported, Feet supported Sitting balance-Leahy Scale: Good     Standing balance support: Single extremity supported, Bilateral upper extremity supported, During functional activity Standing balance-Leahy Scale: Poor Standing balance comment: able to stand at sink for grooming tasks with one extremity support                           ADL either performed or assessed with clinical judgement   ADL Overall ADL's : Needs assistance/impaired     Grooming: Wash/dry hands;Wash/dry face;Oral care;Min guard;Standing Grooming Details (indicate cue type and reason): at sink Upper Body Bathing: Set up;Sitting       Upper Body Dressing : Set up;Sitting Upper Body Dressing Details (indicate cue type and reason): donn gown to cover back Lower Body Dressing: Min guard;Sit to/from stand Lower Body Dressing Details (indicate cue type and reason): donned pants with min guard while standing Toilet Transfer: Min guard;Ambulation;Rolling walker (2 wheels);BSC/3in1 Toilet Transfer Details (indicate cue type and reason): training performed with [facility]  Extremity/Trunk Assessment              Vision       Perception     Praxis      Cognition  Arousal/Alertness: Awake/alert Behavior During Therapy: WFL for tasks assessed/performed Overall Cognitive Status: History of cognitive impairments - at baseline                                 General Comments: able to give correct date and place but was unable to give situation        Exercises Exercises: General Upper Extremity General Exercises - Upper Extremity Elbow Flexion: Strengthening, Both, 10 reps, Seated, Theraband Theraband Level (Elbow Flexion): Level 2 (Red) Elbow Extension: Strengthening, Both, 10 reps, Seated, Theraband Theraband Level (Elbow Extension): Level 2 (Red)    Shoulder Instructions       General Comments      Pertinent Vitals/ Pain       Pain Assessment Pain Assessment: No/denies pain Pain Intervention(s): Monitored during session  Home Living                                          Prior Functioning/Environment              Frequency  Min 2X/week        Progress Toward Goals  OT Goals(current goals can now be found in the care plan section)  Progress towards OT goals: Progressing toward goals  Acute Rehab OT Goals Patient Stated Goal: get better OT Goal Formulation: With patient Time For Goal Achievement: 01/13/22 Potential to Achieve Goals: Good ADL Goals Pt Will Perform Grooming: with supervision;standing Pt Will Perform Upper Body Bathing: with set-up;with modified independence;sitting Pt Will Perform Lower Body Bathing: with supervision;sit to/from stand;with set-up Pt Will Perform Upper Body Dressing: with set-up;with supervision;sitting Pt Will Perform Lower Body Dressing: with set-up;with supervision;sit to/from stand Pt Will Transfer to Toilet: with supervision;ambulating;regular height toilet;grab bars Pt Will Perform Toileting - Clothing Manipulation and hygiene: with supervision;sit to/from stand  Plan Discharge plan remains appropriate;Frequency remains appropriate     Co-evaluation                 AM-PAC OT "6 Clicks" Daily Activity     Outcome Measure   Help from another person eating meals?: None Help from another person taking care of personal grooming?: A Little Help from another person toileting, which includes using toliet, bedpan, or urinal?: A Little Help from another person bathing (including washing, rinsing, drying)?: A Little Help from another person to put on and taking off regular upper body clothing?: A Little Help from another person to put on and taking off regular lower body clothing?: A Little 6 Click Score: 19    End of Session Equipment Utilized During Treatment: Rolling walker (2 wheels)  OT Visit Diagnosis: Other abnormalities of gait and mobility (R26.89);Muscle weakness (generalized) (M62.81);History of falling (Z91.81);Other symptoms and signs involving cognitive function   Activity Tolerance Patient tolerated treatment well   Patient Left in chair;with call bell/phone within reach;with chair alarm set   Nurse Communication Mobility status        Time: 5732-2025 OT Time Calculation (min): 36 min  Charges: OT General Charges $OT Visit: 1 Visit OT Treatments $Self Care/Home Management : 23-37 mins  Lodema Hong, OTA Acute  Rehabilitation Services  Office [phone]   Trixie Dredge 01/07/2022, 11:57 AM

## 2022-01-07 NOTE — Plan of Care (Signed)
  Problem: Education: Goal: Knowledge of condition and prescribed therapy will improve Outcome: Progressing   Problem: Cardiac: Goal: Will achieve and/or maintain adequate cardiac output Outcome: Progressing   Problem: Physical Regulation: Goal: Complications related to the disease process, condition or treatment will be avoided or minimized Outcome: Progressing   Problem: Safety: Goal: Non-violent Restraint(s) Outcome: Progressing   Problem: Education: Goal: Knowledge of General Education information will improve Description: Including pain rating scale, medication(s)/side effects and non-pharmacologic comfort measures Outcome: Progressing   Problem: Health Behavior/Discharge Planning: Goal: Ability to manage health-related needs will improve Outcome: Progressing   Problem: Clinical Measurements: Goal: Ability to maintain clinical measurements within normal limits will improve Outcome: Progressing Goal: Will remain free from infection Outcome: Progressing Goal: Diagnostic test results will improve Outcome: Progressing Goal: Respiratory complications will improve Outcome: Progressing Goal: Cardiovascular complication will be avoided Outcome: Progressing   Problem: Activity: Goal: Risk for activity intolerance will decrease Outcome: Progressing   Problem: Nutrition: Goal: Adequate nutrition will be maintained Outcome: Progressing   Problem: Coping: Goal: Level of anxiety will decrease Outcome: Progressing   Problem: Elimination: Goal: Will not experience complications related to bowel motility Outcome: Progressing Goal: Will not experience complications related to urinary retention Outcome: Progressing   Problem: Pain Managment: Goal: General experience of comfort will improve Outcome: Progressing   Problem: Safety: Goal: Ability to remain free from injury will improve Outcome: Progressing   Problem: Skin Integrity: Goal: Risk for impaired skin integrity  will decrease Outcome: Progressing   Problem: Safety: Goal: Non-violent Restraint(s) Outcome: Progressing   

## 2022-01-07 NOTE — Progress Notes (Signed)
[facility]  PROGRESS NOTE  Jacob Becker UXL:[id number] DOB: [birth year] DOA: 12/28/2021 PCP: Jacob Aspen, MD   Brief HPI:   88 year old male with medical history of advanced dementia, chronic atrial fibrillation on Xarelto, hypertension, CAD, prostate cancer in remission on chronic hormone manipulation, hepatic and pancreatic cyst presented with near syncope and fall. Patient sustained rib fractures with right hemothorax, chest tube was placed.  Plan to go home when stable.  Trauma surgery following.    Subjective   Patient seen and examined, chest tube on waterseal.  Patient wants to go home.  Chest x-ray this morning again shows right apical pneumothorax.  No air leak   Assessment/Plan:    Right side traumatic hemothorax -Chest tube placed by trauma surgery in the ED -General surgery following -Chest x-ray obtained today showed small apical pneumothorax -Chest tube in place -Management per general surgery  Acute blood loss anemia -Secondary to rib fracture and right-sided hemothorax -Hemoglobin is stable at 8.5 -Patient was given 1 unit PRBC -Xarelto has been hold; general surgery recommends to hold anticoagulation indefinitely  Nondisplaced RIGHT anterior acetabular rim fracture.  -ortho consulted - WBAT, no surgery needed  Left elbow swelling -Concern for infectious process -Has significant erythema and edema -?  Olecranon bursitis versus gout -Continue Augmentin 1 tablet p.o. twice daily -Orthopedic surgery was consulted yesterday, left elbow swelling was aspirated -Aspirate analysis showed 99% neutrophils, no crystals seen -Discussed with orthopedics, no further aspiration required.  Will continue with Augmentin.   AKI -Secondary to hypotension and dehydration -CT abdomen/ pelvis from 12/1 did not show hydronephrosis -Creatinine is 1.28 today    Hypertension -Blood pressure is stable -Continue amlodipine, metoprolol     Hyperkalemia -resolved   Lactic acidosis -Initial lactate was 4.9, repeat lactic acid 1.4 -Likely secondary to hypoperfusion from hypotension and bradycardia and acute blood loss anemia.     Acute metabolic encephalopathy -Resolved -delirium precautions  -PRN haldol    Near syncope -Likely from combination of hypotension and bradycardia. -On most recent cardiology evaluation in April 2023, cardiology cut down patient's metoprolol to 50 mg daily and plan to further cut down patient's amlodipine as patient continues to lose weight.   - unfortunately, patient still taking same dosage of metoprolol XL 100 mg daily and amlodipine 10 mg daily. Resumed at lower dose -echo showed EF of 60 to 65%, no regional wall motion abnormalities, left atrial size was moderately dilated   Chronic A-fib -resume metoprolol -Heart rate is controlled -xarelto on hold for now; trauma surgery recommends to hold it indefinitely   Chronic urinary retention -Has been doing self catheterizing at home -foley placed by urology -- will leave in until patient able to do own self caths   Prostate CA -In Remission, continue home hormone therapy medication   Liver cyst, pancreatic cyst -Stable on CT scan   Moderate protein calorie malnutrition with unintentional weight loss -about 15 lbs weight loss in last 6 months for poor appetite and food intake, - for which patient has been on Ensure twice daily.   Medications     acetaminophen  1,000 mg Oral TID   amLODipine  5 mg Oral Daily   amoxicillin-clavulanate  1 tablet Oral Q12H   Chlorhexidine Gluconate Cloth  6 each Topical Daily   colesevelam  1,250 mg Oral BID   docusate sodium  100 mg Oral BID   enzalutamide  160 mg Oral QHS   feeding supplement  237 mL Oral BID BM  heparin injection (subcutaneous)  5,000 Units Subcutaneous Q8H   lidocaine  1 patch Transdermal Q24H   metoprolol succinate  50 mg Oral Daily   rosuvastatin  40 mg Oral Daily    sodium chloride flush  3 mL Intravenous Q12H     Data Reviewed:   CBG:  Recent Labs  Lab 01/02/22 0440 01/06/22 0533  GLUCAP 123* 111*    SpO2: 100 %    Vitals:   01/06/22 1725 01/06/22 2002 01/07/22 0632 01/07/22 0713  BP: 131/61 134/82 (!) 142/82 (!) 162/87  Pulse: 75 67 67 71  Resp: 18 18 18 18   Temp: 99.1 F (37.3 C) 98.5 F (36.9 C) 98.1 F (36.7 C) 98.2 F (36.8 C)  TempSrc: Oral Oral Oral Oral  SpO2: 96% 97% 98% 100%  Weight:      Height:          Data Reviewed:  Basic Metabolic Panel: Recent Labs  Lab 01/02/22 0236 01/04/22 0320  NA 133* 136  K 3.9 4.2  CL 105 106  CO2 21* 22  GLUCOSE 128* 112*  BUN 36* 34*  CREATININE 1.23 1.28*  CALCIUM 8.3* 8.3*    CBC: Recent Labs  Lab 01/02/22 0236 01/04/22 0320 01/06/22 0424  WBC 10.1 9.6 9.4  HGB 8.8* 8.7* 8.5*  HCT 26.0* 25.5* 25.7*  MCV 94.9 95.1 96.3  PLT 175 210 233    LFT No results for input(s): "AST", "ALT", "ALKPHOS", "BILITOT", "PROT", "ALBUMIN" in the last 168 hours.    Antibiotics: Anti-infectives (From admission, onward)    Start     Dose/Rate Route Frequency Ordered Stop   01/03/22 1215  amoxicillin-clavulanate (AUGMENTIN) 875-125 MG per tablet 1 tablet        1 tablet Oral Every 12 hours 01/03/22 1125     01/01/22 1345  cefdinir (OMNICEF) capsule 300 mg  Status:  Discontinued        300 mg Oral Every 12 hours 01/01/22 1259 01/03/22 1125   12/28/21 1215  cefTRIAXone (ROCEPHIN) 1 g in sodium chloride 0.9 % 100 mL IVPB  Status:  Discontinued        1 g 200 mL/hr over 30 Minutes Intravenous  Once 12/28/21 1205 12/28/21 1320   12/28/21 1215  azithromycin (ZITHROMAX) 500 mg in sodium chloride 0.9 % 250 mL IVPB  Status:  Discontinued        500 mg 250 mL/hr over 60 Minutes Intravenous  Once 12/28/21 1205 12/28/21 1320        DVT prophylaxis: Heparin  Code Status: Full code  Family Communication: Discussed with patient's daughter at bedside   CONSULTS trauma  surgery   Objective    Physical Examination:  General-appears in no acute distress Heart-S1-S2, regular, no murmur auscultated Lungs-clear to auscultation bilaterally, chest tube in place Abdomen-soft, nontender, no organomegaly Elbow-left elbow erythema has significantly improved Neuro-alert, oriented x3, no focal deficit noted   Status is: Inpatient:          Meredeth Ide   [facility] If 7PM-7AM, please contact night-coverage at [email or web address], Office  [phone]   01/07/2022, 10:46 AM  LOS: 10 days

## 2022-01-07 NOTE — Progress Notes (Signed)
Progress Note     Subjective: Pt pleasant but confused this AM. Denies SOB. No family at bedside this AM.   Objective: Vital signs in last 24 hours: Temp:  [98.1 F (36.7 C)-99.1 F (37.3 C)] 98.2 F (36.8 C) (12/11 0713) Pulse Rate:  [67-95] 71 (12/11 0713) Resp:  [17-18] 18 (12/11 0713) BP: (131-162)/(61-87) 162/87 (12/11 0713) SpO2:  [95 %-100 %] 100 % (12/11 0713) Last BM Date : 01/05/22  Intake/Output from previous day: 12/10 0701 - 12/11 0700 In: 60 [P.O.:60] Out: 1145 [Urine:1025; Chest Tube:120] Intake/Output this shift: No intake/output data recorded.  PE: General: pleasant, WD, elderly male who is laying in bed in NAD Heart: regular, rate, and rhythm.   Lungs: CTAB, no wheezes, rhonchi, or rales noted.  Respiratory effort nonlabored. R CT without airleak and SS fluid in tubing.  MS: L elbow with erythema and scabbing, ROM grossly intact    Lab Results:  Recent Labs    01/06/22 0424  WBC 9.4  HGB 8.5*  HCT 25.7*  PLT 233   BMET No results for input(s): "NA", "K", "CL", "CO2", "GLUCOSE", "BUN", "CREATININE", "CALCIUM" in the last 72 hours. PT/INR No results for input(s): "LABPROT", "INR" in the last 72 hours. CMP     Component Value Date/Time   NA 136 01/04/2022 0320   K 4.2 01/04/2022 0320   CL 106 01/04/2022 0320   CO2 22 01/04/2022 0320   GLUCOSE 112 (H) 01/04/2022 0320   BUN 34 (H) 01/04/2022 0320   CREATININE 1.28 (H) 01/04/2022 0320   CALCIUM 8.3 (L) 01/04/2022 0320   PROT 5.4 (L) 12/28/2021 1043   ALBUMIN 2.5 (L) 12/28/2021 1043   AST 32 12/28/2021 1043   ALT 17 12/28/2021 1043   ALKPHOS 126 12/28/2021 1043   BILITOT 0.6 12/28/2021 1043   GFRNONAA 53 (L) 01/04/2022 0320   Lipase  No results found for: "LIPASE"     Studies/Results: DG Chest Port 1 View  Result Date: 01/07/2022 CLINICAL DATA:  Right sided chest tube, pneumothorax. EXAM: PORTABLE CHEST 1 VIEW COMPARISON:  January 06, 2022. FINDINGS: Stable cardiomediastinal  silhouette. Right-sided chest tube is unchanged in position. Minimal right apical pneumothorax is noted. Mild left basilar atelectasis is noted. Stable right basilar atelectasis is noted as well. Bony thorax is unremarkable. IMPRESSION: Stable position of right-sided chest tube with minimal right apical pneumothorax. Electronically Signed   By: Marijo Conception M.D.   On: 01/07/2022 08:01   DG Chest Port 1 View  Result Date: 01/06/2022 CLINICAL DATA:  Pneumothorax follow-up EXAM: PORTABLE CHEST 1 VIEW COMPARISON:  01/05/2022 FINDINGS: Unchanged right chest tube positioning. Small right apical pneumothorax. Hazy density at the right base with volume loss attributed to atelectasis and pleural fluid. Stable heart size and mediastinal contours. IMPRESSION: 1. Small right apical pneumothorax. Unchanged chest tube positioning. 2. Increased atelectasis at the right base. Electronically Signed   By: Jorje Guild M.D.   On: 01/06/2022 07:20    Anti-infectives: Anti-infectives (From admission, onward)    Start     Dose/Rate Route Frequency Ordered Stop   01/03/22 1215  amoxicillin-clavulanate (AUGMENTIN) 875-125 MG per tablet 1 tablet        1 tablet Oral Every 12 hours 01/03/22 1125     01/01/22 1345  cefdinir (OMNICEF) capsule 300 mg  Status:  Discontinued        300 mg Oral Every 12 hours 01/01/22 1259 01/03/22 1125   12/28/21 1215  cefTRIAXone (ROCEPHIN) 1 g in  sodium chloride 0.9 % 100 mL IVPB  Status:  Discontinued        1 g 200 mL/hr over 30 Minutes Intravenous  Once 12/28/21 1205 12/28/21 1320   12/28/21 1215  azithromycin (ZITHROMAX) 500 mg in sodium chloride 0.9 % 250 mL IVPB  Status:  Discontinued        500 mg 250 mL/hr over 60 Minutes Intravenous  Once 12/28/21 1205 12/28/21 1320        Assessment/Plan  Fall on xarelto   R hemothorax - s/p chest tube placement 12/2.  CXR with minimal R apical PTX on WS, no air leak. Will discuss with MD, CT put out 120 cc SS fluid in last 24 hr so  would not remove today regardless  R rib fractures 10-11 - multimodal pain control and pulm toilet ABL anemia - s/p 1u PRBCs 12/1. Hgb 8.5 12/10, stable Right anterior acetabular rib fx - per ortho, Dr. Sherilyn Dacosta, nonop, WBAT RLE Left elbow lac - from previous fall. Sutures removed 12/7.  Cellulitis improved, management per ortho   Chronic A fib on xarelto - would consider holding anticoagulation indefinitely  Chronic urinary retention - I&O caths at home. Foley placed by urology 12/2.  Prostate cancer HTN HLD   ID - none currently FEN - IVF per TRH, HH/CM diet VTE - SCDs, sq heparin Foley - placed by urology 12/2   LOS: 10 days     Juliet Rude, [facility] 01/07/2022, 9:04 AM Please see Amion for pager number during day hours 7:00am-4:30pm

## 2022-01-07 NOTE — TOC Progression Note (Signed)
Transition of Care [facility]) - Progression Note    Patient Details  Name: Jacob Becker MRN: [id number] Date of Birth: 07/31/1929  [facility] CM/SW Contact  Carley Hammed, [place] Phone Number: 01/07/2022, 1:11 PM  Clinical Narrative:    CSW notified by medical team that pt is currently not medically ready for DC. CSW spoke with family and advised that CSW will update the facility and will follow for when pt is medically stable. Facility updated. TOC will continue to follow for DC needs.   Expected Discharge Plan: Home w Home Health Services Barriers to Discharge: Continued Medical Work up  Expected Discharge Plan and Services Expected Discharge Plan: Home w Home Health Services   Discharge Planning Services: CM Consult Post Acute Care Choice: Home Health Living arrangements for the past 2 months: Single Family Home                 DME Arranged:  (awaiting decision walker vs rollator)         HH Arranged: RN, PT, OT HH Agency: Other - See comment ([facility]) Date HH Agency Contacted: 12/31/21 Time HH Agency Contacted: 1425 Representative spoke with at [facility] Agency: Eber Jones   Social Determinants of Health (SDOH) Interventions    Readmission Risk Interventions     No data to display

## 2022-01-07 NOTE — Progress Notes (Signed)
Physical Therapy Treatment Patient Details Name: Jacob Becker MRN: [id number] DOB: [birth year] Today's Date: 01/07/2022   History of Present Illness 88 y.o. male presents to [facility] hospital on 12/28/2021 after near syncope and a fall. Pt found to have R hemothorax, R rib 10-11 fx, R nondisplaced anterior wall acetabular fx. PMH includes dementia, afib, HTN, CAD, prostate CA.    PT Comments    Pt received in supine, agreeable to therapy session and oriented x1-2 (variable recall of situation/location during session). Pt needing minA for transfers and gait trial with RW and dense cues for safety/use of device and navigation in hallway. Pt heavily reliant on bed rails and HOB elevated to perform bed mobility. Pt with CT and urinary catheter in place but he has decreased awareness of lines, sitter remains outside room to monitor him at end of session and chair alarm on for safety. Pt continues to benefit from PT services to progress toward functional mobility goals.    Recommendations for follow up therapy are one component of a multi-disciplinary discharge planning process, led by the attending physician.  Recommendations may be updated based on patient status, additional functional criteria and insurance authorization.  Follow Up Recommendations  Skilled nursing-short term rehab (<3 hours/day) Can patient physically be transported by private vehicle: Yes   Assistance Recommended at Discharge Frequent or constant Supervision/Assistance  Patient can return home with the following Help with stairs or ramp for entrance;A little help with walking and/or transfers;Assist for transportation;Direct supervision/assist for medications management;Direct supervision/assist for financial management;A little help with bathing/dressing/bathroom   Equipment Recommendations  Rolling walker (2 wheels)    Recommendations for Other Services       Precautions / Restrictions Precautions Precautions:  Fall Precaution Comments: chest tube, dementia related confusion Restrictions Weight Bearing Restrictions: No Other Position/Activity Restrictions: avoid WB through his L elbow per ortho note given L elbow bursitis but pt allowed to use LUE otherwise; pt able to recall during session     Mobility  Bed Mobility               General bed mobility comments: Increased time and dense cues for technique for self-assist using bed rail, HOB partially elevated, pt heavily reliant on rail but only needing min guard for safety to rise. Pt at times attempting to return to sidelying once upright but agreeable to transfers/gait with multiple reminders; poor attention to task.    Transfers Overall transfer level: Needs assistance Equipment used: Rolling walker (2 wheels) Transfers: Sit to/from Stand, Bed to chair/wheelchair/BSC Sit to Stand: Min assist           General transfer comment: min assist and cues for hand placement for sit to stands from lower bed height and chair height <>RW.    Ambulation/Gait Ambulation/Gait assistance: Min assist Gait Distance (Feet): 80 Feet Assistive device: Rolling walker (2 wheels) Gait Pattern/deviations: Step-through pattern, Drifts right/left, Narrow base of support, Trunk flexed, Decreased stride length       General Gait Details: minA for RW management, dense cues for activity pacing and environment navigation. Pt tending to significant trunk flexion and downward gaze with poor RW management (standing too far away from AD) with decreased carryover of cues.      Balance Overall balance assessment: Needs assistance Sitting-balance support: No upper extremity supported, Feet supported Sitting balance-Leahy Scale: Good     Standing balance support: Single extremity supported, Bilateral upper extremity supported, During functional activity Standing balance-Leahy Scale: Poor Standing balance comment: Pt able  to stand and reach for object 6-8"  outside BOS with min guard and single UE support. Reliant on RW for gait         Cognition Arousal/Alertness: Awake/alert Behavior During Therapy: [facility] for tasks assessed/performed Overall Cognitive Status: History of cognitive impairments - at baseline      General Comments: Appears oriented to self and location initially, then asking PTA to go into "next room" and grab his phone for him. Pt reoriented to hospital/his children likely have his telephone in their care. Pt tending to forget about chest tube and catheter lines and requesting to urinate during session; reoriented him to catheter.        Exercises Other Exercises Other Exercises: seated IS x 10 reps achieves ~[phone] mL x 2 sets    General Comments General comments (skin integrity, edema, etc.): No acute s/sx distress, CT and foley lines intact.      Pertinent Vitals/Pain Pain Assessment Pain Assessment: PAINAD Breathing: normal Negative Vocalization: none Facial Expression: smiling or inexpressive Body Language: relaxed Consolability: no need to console PAINAD Score: 0 Pain Location: pt denies pain at chest tube site, possibly some L elbow pain from bursitis but he did not c/o this during session but did recall to PTA not to press the area. Pain Intervention(s): Monitored during session, Repositioned           PT Goals (current goals can now be found in the care plan section) Acute Rehab PT Goals Patient Stated Goal: to return home PT Goal Formulation: With patient/family Time For Goal Achievement: 01/13/22 Progress towards PT goals: Progressing toward goals    Frequency    Min 3X/week      PT Plan Current plan remains appropriate       AM-PAC PT "6 Clicks" Mobility   Outcome Measure  Help needed turning from your back to your side while in a flat bed without using bedrails?: A Little Help needed moving from lying on your back to sitting on the side of a flat bed without using bedrails?: A  Lot (mod cues and heavy reliance on rail) Help needed moving to and from a bed to a chair (including a wheelchair)?: A Little Help needed standing up from a chair using your arms (e.g., wheelchair or bedside chair)?: A Little Help needed to walk in hospital room?: A Little Help needed climbing 3-5 steps with a railing? : Total (anticipate he would need +2 and bilateral rails today based on cognition) 6 Click Score: 15    End of Session Equipment Utilized During Treatment: Gait belt Activity Tolerance: Patient tolerated treatment well Patient left: in chair;with call bell/phone within reach;with chair alarm set;Other (comment);with nursing/sitter in room (sitter sitting just outside of his room on telephone) Nurse Communication: Mobility status;Precautions (pt not agreeable to return to bed yet) PT Visit Diagnosis: Other abnormalities of gait and mobility (R26.89)     Time: 1342-1401 PT Time Calculation (min) (ACUTE ONLY): 19 min  Charges:  $Gait Training: 8-22 mins                     Lavonia Eager P., PTA Acute Rehabilitation Services Secure Chat Preferred 9a-5:30pm Office: [phone]    [place]us [place] 01/07/2022, 2:27 PM

## 2022-01-08 ENCOUNTER — Inpatient Hospital Stay (HOSPITAL_COMMUNITY): Payer: Medicare Other

## 2022-01-08 DIAGNOSIS — S270XXA Traumatic pneumothorax, initial encounter: Secondary | ICD-10-CM

## 2022-01-08 DIAGNOSIS — G301 Alzheimer's disease with late onset: Secondary | ICD-10-CM

## 2022-01-08 DIAGNOSIS — S2239XA Fracture of one rib, unspecified side, initial encounter for closed fracture: Secondary | ICD-10-CM

## 2022-01-08 DIAGNOSIS — M71122 Other infective bursitis, left elbow: Secondary | ICD-10-CM

## 2022-01-08 DIAGNOSIS — A4902 Methicillin resistant Staphylococcus aureus infection, unspecified site: Secondary | ICD-10-CM

## 2022-01-08 DIAGNOSIS — I4811 Longstanding persistent atrial fibrillation: Secondary | ICD-10-CM | POA: Diagnosis not present

## 2022-01-08 DIAGNOSIS — F0284 Dementia in other diseases classified elsewhere, unspecified severity, with anxiety: Secondary | ICD-10-CM

## 2022-01-08 DIAGNOSIS — J942 Hemothorax: Secondary | ICD-10-CM | POA: Diagnosis not present

## 2022-01-08 DIAGNOSIS — M71022 Abscess of bursa, left elbow: Secondary | ICD-10-CM

## 2022-01-08 DIAGNOSIS — I4891 Unspecified atrial fibrillation: Secondary | ICD-10-CM

## 2022-01-08 DIAGNOSIS — R55 Syncope and collapse: Secondary | ICD-10-CM

## 2022-01-08 DIAGNOSIS — E875 Hyperkalemia: Secondary | ICD-10-CM

## 2022-01-08 DIAGNOSIS — N179 Acute kidney failure, unspecified: Secondary | ICD-10-CM | POA: Diagnosis not present

## 2022-01-08 LAB — GLUCOSE, CAPILLARY: Glucose-Capillary: 103 mg/dL — ABNORMAL HIGH (ref 70–99)

## 2022-01-08 MED ORDER — LINEZOLID 600 MG PO TABS
600.0000 mg | ORAL_TABLET | Freq: Two times a day (BID) | ORAL | Status: DC
  Administered 2022-01-08 – 2022-01-10 (×4): 600 mg via ORAL
  Filled 2022-01-08 (×5): qty 1

## 2022-01-08 NOTE — Plan of Care (Signed)
  Problem: Education: Goal: Knowledge of condition and prescribed therapy will improve Outcome: Progressing   Problem: Cardiac: Goal: Will achieve and/or maintain adequate cardiac output Outcome: Progressing   Problem: Physical Regulation: Goal: Complications related to the disease process, condition or treatment will be avoided or minimized Outcome: Progressing   Problem: Safety: Goal: Non-violent Restraint(s) Outcome: Progressing   Problem: Education: Goal: Knowledge of General Education information will improve Description: Including pain rating scale, medication(s)/side effects and non-pharmacologic comfort measures Outcome: Progressing   Problem: Health Behavior/Discharge Planning: Goal: Ability to manage health-related needs will improve Outcome: Progressing   Problem: Clinical Measurements: Goal: Ability to maintain clinical measurements within normal limits will improve Outcome: Progressing Goal: Will remain free from infection Outcome: Progressing Goal: Diagnostic test results will improve Outcome: Progressing Goal: Respiratory complications will improve Outcome: Progressing Goal: Cardiovascular complication will be avoided Outcome: Progressing   Problem: Activity: Goal: Risk for activity intolerance will decrease Outcome: Progressing   Problem: Nutrition: Goal: Adequate nutrition will be maintained Outcome: Progressing   Problem: Coping: Goal: Level of anxiety will decrease Outcome: Progressing   Problem: Elimination: Goal: Will not experience complications related to bowel motility Outcome: Progressing Goal: Will not experience complications related to urinary retention Outcome: Progressing   Problem: Pain Managment: Goal: General experience of comfort will improve Outcome: Progressing   Problem: Safety: Goal: Ability to remain free from injury will improve Outcome: Progressing   Problem: Skin Integrity: Goal: Risk for impaired skin integrity  will decrease Outcome: Progressing   Problem: Safety: Goal: Non-violent Restraint(s) Outcome: Progressing   

## 2022-01-08 NOTE — Progress Notes (Signed)
Physical Therapy Treatment Patient Details Name: Jacob Becker MRN: [id number] DOB: 1930-01-11 Today's Date: 01/08/2022   History of Present Illness 88 y.o. male presents to [facility] hospital on 12/28/2021 after near syncope and a fall. Pt found to have R hemothorax, R rib 10-11 fx, R nondisplaced anterior wall acetabular fx. PMH includes dementia, afib, HTN, CAD, prostate CA.    PT Comments    Pt received in supine, agreeable to therapy session and with good participation and tolerance for transfer, gait and stair training. Pt son in law present and encouraging, pt less impulsive today and following instructions well for hand placement and safety, does well with errorless learning strategy given dementia. Pt with mild episodes of scissoring x2 during gait trial needing up to minA to correct and performs 8" step x3 reps with RW support and min to modA for safety and stability (+2 safety). Pt did not eat much from dinner meal tray, PTA encouraged him to continue PO intake to regain muscle strength and endurance, pt intermittently receptive to this instruction. Pt continues to benefit from PT services to progress toward functional mobility goals.   Recommendations for follow up therapy are one component of a multi-disciplinary discharge planning process, led by the attending physician.  Recommendations may be updated based on patient status, additional functional criteria and insurance authorization.  Follow Up Recommendations  Skilled nursing-short term rehab (<3 hours/day) Can patient physically be transported by private vehicle: Yes   Assistance Recommended at Discharge Frequent or constant Supervision/Assistance  Patient can return home with the following Help with stairs or ramp for entrance;A little help with walking and/or transfers;Assist for transportation;Direct supervision/assist for medications management;Direct supervision/assist for financial management;A little help with  bathing/dressing/bathroom   Equipment Recommendations  Rolling walker (2 wheels)    Recommendations for Other Services       Precautions / Restrictions Precautions Precautions: Fall Precaution Comments: chest tube, dementia related confusion Restrictions Weight Bearing Restrictions: No Other Position/Activity Restrictions: avoid WB through his L elbow per ortho note given L elbow bursitis but pt allowed to use LUE otherwise; pt able to recall during session     Mobility  Bed Mobility Overal bed mobility: Needs Assistance Bed Mobility: Supine to Sit     Supine to sit: Min assist, HOB elevated     General bed mobility comments: Increased time and dense cues for technique for self-assist using bed rail, HOB elevated, needing up to minA for stability raising trunk    Transfers Overall transfer level: Needs assistance Equipment used: Rolling walker (2 wheels) Transfers: Sit to/from Stand, Bed to chair/wheelchair/BSC Sit to Stand: Min assist, Min guard           General transfer comment: min assist and cues for hand placement for sit to stands from lower bed height, min guard for stand to sit to chair height from RW.    Ambulation/Gait Ambulation/Gait assistance: Min assist, +2 safety/equipment Gait Distance (Feet): 75 Feet Assistive device: Rolling walker (2 wheels) Gait Pattern/deviations: Step-through pattern, Drifts right/left, Narrow base of support, Trunk flexed, Decreased stride length, Scissoring       General Gait Details: minA for RW management, intermittent cues for activity pacing and environment navigation. Pt tending to significant trunk flexion and downward gaze, improves briefly with cues; pt with episode of scissoring x2 during trial needing up to minA for stability.   Stairs Stairs: Yes Stairs assistance: Min assist, Mod assist, +2 safety/equipment Stair Management: With walker, Forwards, Step to pattern Number of Stairs:  3 General stair comments:  cues for step sequencing, safety and activity pacing; no overt LOB; son-in-law guarding pt/RW for safety on his other side.     Balance Overall balance assessment: Needs assistance Sitting-balance support: No upper extremity supported, Feet supported Sitting balance-Leahy Scale: Good     Standing balance support: Single extremity supported, Bilateral upper extremity supported, During functional activity Standing balance-Leahy Scale: Poor Standing balance comment: Reliant on RW for gait, scissoring causing LOB                            Cognition Arousal/Alertness: Awake/alert Behavior During Therapy: WFL for tasks assessed/performed Overall Cognitive Status: History of cognitive impairments - at baseline                                 General Comments: Appears oriented to self and location today, pt with improved command following this date but still mildly impulsive; his son-in-law was present and helpful during mobility. Pt initially strongly refusing to eat more than a bite of his dinner but after gait trial, pt agreed to try to eat some more. Family present to assist him while he is up in chair/eating.        Exercises      General Comments General comments (skin integrity, edema, etc.): VSS per chart review, no acute s/sx distress      Pertinent Vitals/Pain Pain Assessment Pain Assessment: PAINAD Breathing: normal Negative Vocalization: none Facial Expression: smiling or inexpressive Body Language: relaxed Consolability: no need to console PAINAD Score: 0 Pain Location: none reported Pain Intervention(s): Monitored during session, Repositioned           PT Goals (current goals can now be found in the care plan section) Acute Rehab PT Goals Patient Stated Goal: to return home PT Goal Formulation: With patient/family Time For Goal Achievement: 01/13/22 Progress towards PT goals: Progressing toward goals    Frequency    Min  3X/week      PT Plan Current plan remains appropriate       AM-PAC PT "6 Clicks" Mobility   Outcome Measure  Help needed turning from your back to your side while in a flat bed without using bedrails?: A Little Help needed moving from lying on your back to sitting on the side of a flat bed without using bedrails?: A Lot (mod cues and heavy reliance on rail) Help needed moving to and from a bed to a chair (including a wheelchair)?: A Little Help needed standing up from a chair using your arms (e.g., wheelchair or bedside chair)?: A Little Help needed to walk in hospital room?: A Lot (mod safety cues and +2 safety) Help needed climbing 3-5 steps with a railing? : A Lot (with single rail anticipate a lot) 6 Click Score: 15    End of Session Equipment Utilized During Treatment: Gait belt Activity Tolerance: Patient tolerated treatment well Patient left: in chair;with call bell/phone within reach;with chair alarm set;with family/visitor present Nurse Communication: Mobility status;Precautions;Other (comment) (family in room with him; pt didnt eat much) PT Visit Diagnosis: Other abnormalities of gait and mobility (R26.89)     Time: 6295-2841 PT Time Calculation (min) (ACUTE ONLY): 15 min  Charges:  $Gait Training: 8-22 mins                     Jeromey Kruer P., PTA Acute Rehabilitation Services  Secure Chat Preferred 9a-5:30pm Office: [phone]    Dorathy Kinsman [facility] 01/08/2022, 6:35 PM

## 2022-01-08 NOTE — TOC CM/SW Note (Signed)
Patient discussed in QC meeting today with TOC Director Zack Brooks and leader Jessica Scinto   

## 2022-01-08 NOTE — Progress Notes (Signed)
[facility]  PROGRESS NOTE  Jacob Becker ZDG:[id number] DOB: 1929/04/01 DOA: 12/28/2021 PCP: Emilio Aspen, MD   Brief HPI:   88 year old male with medical history of advanced dementia, chronic atrial fibrillation on Xarelto, hypertension, CAD, prostate cancer in remission on chronic hormone manipulation, hepatic and pancreatic cyst presented with near syncope and fall. Patient sustained rib fractures with right hemothorax, chest tube was placed.  He was also found to have right inferior vestibular rim fracture, orthopedics was consulted and it was treated conservatively.   Hemothorax was drained by chest tube, however patient developed small pneumothorax.  Multiple attempts were made to wean off chest tube however patient continued to have small pneumothorax. Trauma surgery was consulted and has been managing chest tube.  Due to recurrence of pneumothorax  Cardiothoracic surgery has been consulted.    Subjective   Patient seen and examined, chest x-ray again today shows increase in the volume of pneumothorax.   Assessment/Plan:    Right side traumatic hemothorax/recurrent pneumothorax -Chest tube placed by trauma surgery in the ED -Hemothorax was drained, chest tube left on suction -Chest tube was placed to -20 mmHg suction due to small pneumothorax -Had multiple attempts to change to waterseal however patient continued to develop pneumothorax on waterseal after resolution of pneumothorax on suction -Trauma surgery has consulted CVTS for further recommendations   Acute blood loss anemia -Secondary to rib fracture and right-sided hemothorax -Hemoglobin is stable at 8.5 -Patient was given 1 unit PRBC -Xarelto has been hold;  trauma surgery recommends to hold anticoagulation indefinitely  Nondisplaced RIGHT anterior acetabular rim fracture.  -ortho consulted -Conservative management recommended - WBAT, no surgery needed  Left elbow swelling -Concern for  infectious process -Has significant erythema and edema -?  Olecranon bursitis versus gout -Started on Augmentin 1 tablet p.o. twice daily -Orthopedic surgery was consulted yesterday, left elbow swelling was aspirated -Aspirate analysis showed 99% neutrophils, no crystals seen -Culture from aspirate grew MRSA; patient started on Bactrim -Will consult ID for further recommendations for antibiotics -Will also consult wound care RN  UTI -Patient was diagnosed with UTI on 12/29/2021 -Culture grew E. coli which was pansensitive to Rocephin and Augmentin -Initially received Rocephin, which was changed to [facility] for 2 days -Has been on Augmentin for 5 days from 01/03/2022 to 01/07/2022 -Patient has been adequately treated with antibiotics for 7 days   AKI -Secondary to hypotension and dehydration -CT abdomen/ pelvis from 12/1 did not show hydronephrosis -Creatinine is 1.28 as of 01/04/2022 -Repeat BMP today    Hypertension -Blood pressure is stable -Continue amlodipine, metoprolol    Hyperkalemia -resolved   Lactic acidosis -Initial lactate was 4.9, repeat lactic acid 1.4 -Likely secondary to hypoperfusion from hypotension and bradycardia and acute blood loss anemia.     Acute metabolic encephalopathy -Resolved -delirium precautions  -PRN haldol    Near syncope -Likely from combination of hypotension and bradycardia. -On most recent cardiology evaluation in April 2023, cardiology cut down patient's metoprolol to 50 mg daily and plan to further cut down patient's amlodipine as patient continues to lose weight.   - unfortunately, patient still taking same dosage of metoprolol XL 100 mg daily and amlodipine 10 mg daily. Resumed at lower dose -echo showed EF of 60 to 65%, no regional wall motion abnormalities, left atrial size was moderately dilated   Chronic A-fib -resume metoprolol -Heart rate is controlled -xarelto on hold for now;  trauma surgery recommends to hold it  indefinitely   Chronic urinary retention -  Has been doing self catheterizing at home -foley placed by urology -- will leave in until patient able to do own self caths   Prostate CA -In Remission, continue home hormone therapy medication   Liver cyst, pancreatic cyst -Stable on CT scan   Moderate protein calorie malnutrition with unintentional weight loss -about 15 lbs weight loss in last 6 months for poor appetite and food intake, - for which patient has been on Ensure twice daily.  Disposition -Patient to go to [facility] skilled nursing facility for rehab  Medications     acetaminophen  1,000 mg Oral TID   amLODipine  5 mg Oral Daily   Chlorhexidine Gluconate Cloth  6 each Topical Daily   colesevelam  1,250 mg Oral BID   docusate sodium  100 mg Oral BID   enzalutamide  160 mg Oral QHS   feeding supplement  237 mL Oral BID BM   heparin injection (subcutaneous)  5,000 Units Subcutaneous Q8H   lidocaine  1 patch Transdermal Q24H   metoprolol succinate  50 mg Oral Daily   rosuvastatin  40 mg Oral Daily   sodium chloride flush  3 mL Intravenous Q12H   sulfamethoxazole-trimethoprim  1 tablet Oral Q12H     Data Reviewed:   CBG:  Recent Labs  Lab 01/02/22 0440 01/06/22 0533 01/08/22 0453  GLUCAP 123* 111* 103*    SpO2: 99 %    Vitals:   01/07/22 1911 01/08/22 0208 01/08/22 0428 01/08/22 0744  BP: (!) 121/58  136/89 (!) 146/79  Pulse: (!) 55  80 72  Resp: 16  17 18   Temp: 98.1 F (36.7 C)  97.7 F (36.5 C) 98.3 F (36.8 C)  TempSrc: Oral  Oral Axillary  SpO2: 96%  96% 99%  Weight:  67.1 kg    Height:          Data Reviewed:  Basic Metabolic Panel: Recent Labs  Lab 01/02/22 0236 01/04/22 0320  NA 133* 136  K 3.9 4.2  CL 105 106  CO2 21* 22  GLUCOSE 128* 112*  BUN 36* 34*  CREATININE 1.23 1.28*  CALCIUM 8.3* 8.3*    CBC: Recent Labs  Lab 01/02/22 0236 01/04/22 0320 01/06/22 0424  WBC 10.1 9.6 9.4  HGB 8.8* 8.7* 8.5*  HCT 26.0* 25.5*  25.7*  MCV 94.9 95.1 96.3  PLT 175 210 233    LFT No results for input(s): "AST", "ALT", "ALKPHOS", "BILITOT", "PROT", "ALBUMIN" in the last 168 hours.    Antibiotics: Anti-infectives (From admission, onward)    Start     Dose/Rate Route Frequency Ordered Stop   01/07/22 1400  sulfamethoxazole-trimethoprim (BACTRIM DS) 800-160 MG per tablet 1 tablet        1 tablet Oral Every 12 hours 01/07/22 1309     01/03/22 1215  amoxicillin-clavulanate (AUGMENTIN) 875-125 MG per tablet 1 tablet  Status:  Discontinued        1 tablet Oral Every 12 hours 01/03/22 1125 01/07/22 1309   01/01/22 1345  cefdinir (OMNICEF) capsule 300 mg  Status:  Discontinued        300 mg Oral Every 12 hours 01/01/22 1259 01/03/22 1125   12/28/21 1215  cefTRIAXone (ROCEPHIN) 1 g in sodium chloride 0.9 % 100 mL IVPB  Status:  Discontinued        1 g 200 mL/hr over 30 Minutes Intravenous  Once 12/28/21 1205 12/28/21 1320   12/28/21 1215  azithromycin (ZITHROMAX) 500 mg in sodium chloride 0.9 % 250 mL  IVPB  Status:  Discontinued        500 mg 250 mL/hr over 60 Minutes Intravenous  Once 12/28/21 1205 12/28/21 1320        DVT prophylaxis: Heparin  Code Status: Full code  Family Communication: Discussed with patient's daughter at bedside   CONSULTS trauma surgery   Objective    Physical Examination:  General-appears in no acute distress Heart-S1-S2, regular, no murmur auscultated Lungs-clear to auscultation bilaterally, chest tube in place Abdomen-soft, nontender, no organomegaly Elbow-left elbow erythematous with yellowish discharge noted, mildly edematous Neuro-alert, oriented x3, no focal deficit noted   Status is: Inpatient:          Meredeth Ide   [facility] If 7PM-7AM, please contact night-coverage at [email or web address], Office  [phone]   01/08/2022, 3:43 PM  LOS: 11 days

## 2022-01-08 NOTE — Progress Notes (Addendum)
Orthopaedics Daily Progress Note   01/08/2022   11:05 AM  Jacob Becker is a 88 y.o. male with left olecranon bursitis.  Subjective Elbow feels great.  Essentially no pain, no limitations in motion.    Objective Vitals:   01/08/22 0428 01/08/22 0744  BP: 136/89 (!) 146/79  Pulse: 80 72  Resp: 17 18  Temp: 97.7 F (36.5 C) 98.3 F (36.8 C)  SpO2: 96% 99%    Intake/Output Summary (Last 24 hours) at 01/08/2022 1105 Last data filed at 01/08/2022 0900 Gross per 24 hour  Intake 440 ml  Output 1260 ml  Net -820 ml    Physical Exam LUE: Resolving erythema of olecranon bursa area Small healing eschar Full painless elbow ROM No TTP +EDC/FDP/APB/DIO SILT M/U/R +Radial and WWP distally  Assessment 87 y.o. male with left olecranon bursitis  Plan Continue antibiotics; consider ID consultation and adjust antibiotics as needed if erythema does not improve Consider wound care consultation Follow up in wound care clinic for left elbow after discharge Consider ID follow-up after discharge to monitor for resolution of infection and for ongoing antibiotic management Orthopaedic follow-up not required for left elbow Recommend compression to the affected area if possible (ace wrap, coban, etc.) to prevent fluid accumulation No orthopaedic interventions at this time Remainder of care per primary team   Ernestina Columbia M.D. Orthopaedic Surgery [facility]

## 2022-01-08 NOTE — Consult Note (Addendum)
[address]       [place] [address]             [phone]        Jacob Becker [facility] Medical Record #[id number] Date of Birth: 07/09/1929  Referring: Diamantina Monks, MD Primary Care: Emilio Aspen, MD   Reason for consult:  Recurring right pneumothorax after recent fall resulting in rib fractures.   History of Present Illness:   Jacob Becker is a pleasant 88 year old gentleman with a history of dementia, hypertension, chronic atrial fibrillation managed with Xarelto prior to admission, who was brought to the hospital after.  Syncopal episode at home resulting in a fall.  He was found to have a right anterior acetabular rim fracture that has been treated conservatively as well as a right hemothorax.  Chest tube was placed by the trauma surgeon in the emergency room 12/29/2021.  Large volume hemothorax was drained chest tube was left on suction for a period of time and then transition to waterseal later in the admission.  At this point, he developed a pneumothorax.  He has had several trials of insulin resulting in reaccumulation of a small pneumothorax.  CT surgery has been asked to evaluate Jacob Becker in regards to the recurring pneumothorax for definitive management.     Currently, Jacob Becker is resting in bed.  He was easily awakened and cooperative.  He seems oriented to place and situation.  Denies any pain or shortness of breath.   Zubrod Score: At the time of surgery this patient's most appropriate activity status/level should be described as: []     0    Normal activity, no symptoms []     1    Restricted in physical strenuous activity but ambulatory, able to do out light work []     2    Ambulatory and capable of self care, unable to do work activities, up and about                 more than 50%  Of the time                            [x]     3    Only limited self care, in bed greater than 50% of waking hours []     4     Completely disabled, no self care, confined to bed or chair []     5    Moribund  History reviewed. No pertinent past medical history.  History reviewed. No pertinent surgical history.  Social History   Tobacco Use  Smoking Status Not on file  Smokeless Tobacco Not on file    Social History   Substance and Sexual Activity  Alcohol Use None     Not on File  Current Facility-Administered Medications  Medication Dose Route Frequency Provider Last Rate Last Admin   acetaminophen (TYLENOL) tablet 1,000 mg  1,000 mg Oral TID Vann, Jessica U, DO   1,000 mg at 01/08/22 0900   amLODipine (NORVASC) tablet 5 mg  5 mg Oral Daily Vann, Jessica U, DO   5 mg at 01/08/22 0900   bisacodyl (DULCOLAX) suppository 10 mg  10 mg Rectal Daily PRN Meuth, Brooke A, PA-C       Chlorhexidine Gluconate Cloth 2 % PADS 6 each  6 each Topical Daily Marlin Canary U, DO   6 each at 01/08/22 1328  colesevelam [facility]) tablet 1,250 mg  1,250 mg Oral BID Mikey College T, MD   1,250 mg at 01/08/22 0900   docusate sodium (COLACE) capsule 100 mg  100 mg Oral BID Meuth, Brooke A, PA-C   100 mg at 01/08/22 0900   enzalutamide (XTANDI) tablet 160 mg  160 mg Oral QHS Marlin Canary U, DO   160 mg at 01/07/22 2251   feeding supplement (ENSURE ENLIVE / ENSURE PLUS) liquid 237 mL  237 mL Oral BID BM Mikey College T, MD   237 mL at 01/08/22 0935   fentaNYL (SUBLIMAZE) injection 12.5 mcg  12.5 mcg Intravenous Q6H PRN Meuth, Brooke A, PA-C   12.5 mcg at 12/30/21 2022   haloperidol lactate (HALDOL) injection 2.5 mg  2.5 mg Intravenous Q6H PRN Marlin Canary U, DO       heparin injection 5,000 Units  5,000 Units Subcutaneous Q8H Vann, Jessica U, DO   5,000 Units at 01/08/22 0552   lidocaine (LIDODERM) 5 % 1 patch  1 patch Transdermal Q24H Meuth, Brooke A, PA-C   1 patch at 01/06/22 0935   methocarbamol (ROBAXIN) tablet 500 mg  500 mg Oral Q6H PRN Meuth, Brooke A, PA-C   500 mg at 01/06/22 0313   metoprolol succinate (TOPROL-XL) 24 hr  tablet 50 mg  50 mg Oral Daily Vann, Jessica U, DO   50 mg at 01/08/22 0900   metoprolol tartrate (LOPRESSOR) injection 2.5 mg  2.5 mg Intravenous Q6H PRN Mikey College T, MD       ondansetron [facility]) injection 4 mg  4 mg Intravenous Q6H PRN Sharl Ma, Sarina Ill, MD       polyethylene glycol (MIRALAX / GLYCOLAX) packet 17 g  17 g Oral Daily PRN Meuth, Brooke A, PA-C   17 g at 01/05/22 1010   rosuvastatin (CRESTOR) tablet 40 mg  40 mg Oral Daily Mikey College T, MD   40 mg at 01/08/22 0900   sodium chloride flush (NS) 0.9 % injection 3 mL  3 mL Intravenous Q12H Mikey College T, MD   3 mL at 01/05/22 1011   sulfamethoxazole-trimethoprim (BACTRIM DS) 800-160 MG per tablet 1 tablet  1 tablet Oral Q12H Meredeth Ide, MD   1 tablet at 01/08/22 0900   traMADol (ULTRAM) tablet 50 mg  50 mg Oral Q6H PRN Meuth, Brooke A, PA-C   50 mg at 01/06/22 5784    Medications Prior to Admission  Medication Sig Dispense Refill Last Dose   acetaminophen (TYLENOL) 500 MG tablet Take 1,000 mg by mouth at bedtime as needed for moderate pain or mild pain.   12/27/2021   amLODipine (NORVASC) 10 MG tablet Take 10 mg by mouth daily.   12/27/2021   calcium citrate (CALCITRATE - DOSED IN MG ELEMENTAL CALCIUM) 950 (200 Ca) MG tablet Take 500 mg of elemental calcium by mouth 2 (two) times daily.   12/27/2021   Coenzyme Q10 (COQ10) 100 MG CAPS Take 1 capsule by mouth daily.   12/27/2021   colesevelam (WELCHOL) 625 MG tablet Take 1,250 mg by mouth 2 (two) times daily.   12/27/2021   Ensure (ENSURE) Take 237 mLs by mouth daily as needed (as directed).   12/27/2021   metoprolol succinate (TOPROL-XL) 100 MG 24 hr tablet See admin instructions. Sig from [facility]:  Take one tablet (100 mg) by mouth in the morning and half tablet (50 mg) by mouth in the evening.   12/27/2021 at evening unk exact time   Omega-3  Fatty Acids (FISH OIL) 1000 MG CAPS Take 1 capsule by mouth in the morning and at bedtime.   12/27/2021   ramipril (ALTACE) 10 MG  capsule Take 10 mg by mouth 2 (two) times daily.   12/27/2021   rosuvastatin (CRESTOR) 40 MG tablet Take 40 mg by mouth daily.   12/27/2021   triamterene-hydrochlorothiazide (MAXZIDE-25) 37.5-25 MG tablet Take 2 tablets by mouth daily.   12/27/2021   XARELTO 20 MG TABS tablet Take 20 mg by mouth daily.   12/27/2021 at morning unk exact time   XTANDI 40 MG tablet Take 160 mg by mouth at bedtime.   12/27/2021    History reviewed. No pertinent family history.   Review of Systems:     Cardiac Review of Systems: Y or  [    ]= no  Chest Pain [    ]  Resting SOB [   ] Exertional SOB  [  ]  Orthopnea [  ]   Pedal Edema [   ]    Palpitations [  ] Syncope  [  ]   Presyncope [   ]  General Review of Systems: [Y] = yes [  ]=no Constitional: recent weight change [  ]; anorexia [  ]; fatigue [  ]; nausea [  ]; night sweats [  ]; fever [  ]; or chills ]                                                                 Eye : blurred vision [  ]; diplopia [   ]; vision changes [  ];  Amaurosis fugax[  ]; Resp: cough [  ];  wheezing[  ];  hemoptysis[  ]; shortness of breath[  ]; paroxysmal nocturnal dyspnea[  ]; dyspnea on exertion[  ]; or orthopnea[  ];  GI:  gallstones[  ], vomiting[  ];  dysphagia[  ]; melena[  ];  hematochezia [  ]; heartburn[  ];   Hx of  Colonoscopy[  ]; GU: kidney stones [  ]; hematuria[  ];   dysuria [  ];  nocturia[  ];  history of     obstruction [  ]; urinary frequency [  ]             Skin: rash, swelling[  ];, hair loss[  ];  peripheral edema[  ];  or itching[  ]; Musculosketetal: myalgias[  ];  joint swelling[  ];  joint erythema[  ];  joint pain[  ];  back pain[  ];  Heme/Lymph: bruising[  ];  bleeding[  ];  anemia[  ];  Neuro: TIA[  ];  headaches[  ];  stroke[  ];  vertigo[  ];  seizures[  ];   paresthesias[  ];  difficulty walking[  ];  Psych:depression[  ]; anxiety[  ];  Endocrine: diabetes[  ];  thyroid dysfunction[  ];              Physical Exam: BP (!) 146/79 (BP  Location: Left Arm)   Pulse 72   Temp 98.3 F (36.8 C) (Axillary)   Resp 18   Ht 5\' 10"  (1.778 m)   Wt 67.1 kg   SpO2 99%   BMI 21.23 kg/m  General appearance: alert, cooperative, and no distress Head: Normocephalic, without obvious abnormality, atraumatic Neck: no adenopathy, no JVD, and supple, symmetrical, trachea midline Lymph nodes: Cervical, supraclavicular, and axillary nodes normal. and cervical clavicular adenopathy Resp: Normal work of breathing on room air.  Breath sounds are clear.  A 20 French pleural tube exits right chest.  The chest tube was noted to be disconnected from the Pleur-evac tubing beneath the wrapping of tape.  There was thin serous fluid and tubing, no airleak is seen.  There is no subcu air in the right chest. Extremities: Various small bruises and excoriations over all extremities.  Has a bandage over his left elbow with some erythema and fluctuance noted in the elbow. Neurologic: Jacob Becker is calm and cooperative.  He seems oriented to place and situation.  He moves all extremities.  Diagnostic Studies & Laboratory data:     Recent Radiology Findings:   DG CHEST PORT 1 VIEW  Result Date: 01/08/2022 CLINICAL DATA:  Pneumothorax. EXAM: PORTABLE CHEST 1 VIEW COMPARISON:  Radiograph 01/07/2022 FINDINGS: Small volume volume RIGHT pneumothorax appreciated RIGHT lung apex and along the RIGHT lateral hemithorax. Slightly increased from prior. RIGHT chest tube in place. Pleural fluid at the RIGHT lung base similar prior. Stable cardiac silhouette with prominent hilar vascular structures no pulmonary edema. No infiltrate. Sclerotic bones consistent with known osseous metastasis IMPRESSION: 1. small volume RIGHT pneumothorax with chest tube in place. Slight increase in volume. 2. RIGHT pleural fluid unchanged Electronically Signed   By: Genevive Bi M.D.   On: 01/08/2022 08:26   DG Chest Port 1 View  Result Date: 01/07/2022 CLINICAL DATA:  Right sided  chest tube, pneumothorax. EXAM: PORTABLE CHEST 1 VIEW COMPARISON:  January 06, 2022. FINDINGS: Stable cardiomediastinal silhouette. Right-sided chest tube is unchanged in position. Minimal right apical pneumothorax is noted. Mild left basilar atelectasis is noted. Stable right basilar atelectasis is noted as well. Bony thorax is unremarkable. IMPRESSION: Stable position of right-sided chest tube with minimal right apical pneumothorax. Electronically Signed   By: Lupita Raider M.D.   On: 01/07/2022 08:01     I have independently reviewed the above radiologic studies and discussed with the patient   Recent Lab Findings: Lab Results  Component Value Date   WBC 9.4 01/06/2022   HGB 8.5 (L) 01/06/2022   HCT 25.7 (L) 01/06/2022   PLT 233 01/06/2022   GLUCOSE 112 (H) 01/04/2022   ALT 17 12/28/2021   AST 32 12/28/2021   NA 136 01/04/2022   K 4.2 01/04/2022   CL 106 01/04/2022   CREATININE 1.28 (H) 01/04/2022   BUN 34 (H) 01/04/2022   CO2 22 01/04/2022   INR 1.9 (H) 12/28/2021      Assessment / Plan:    Very pleasant 88 year old gentleman with dementia, history of hypertension, and chronic atrial fibrillation admitted about 10 days ago with right hemothorax and fractures of ribs 10 and 11 on the right after an apparent fall at home.  He is being started on Zyvox for methicillin-resistant Staphylococcus aureus cultured from the left elbow aspirate obtained a few days ago.   A Right-sided chest tube was placed on admission.  After the hemothorax has resolved for the most part, he continues to have a recurring pneumothorax when the chest tube is taken off suction and placed on water seal.  On evaluation today, the pleural tube was actually disconnected from the drainage tube although both were loosely wrapped with porous tape at the intersection.  It is not clear how long the tubes have been configurued in this manner.  Since there was no active air leak when the tubing was reconnected, I  transitioned the tube to a mini express pleural drainage system.  Will repeat the chest x-ray today to ensure the right lung is well-expanded and if so we will plan to leave the device on waterseal.   We will follow with you.  I  spent 20 minutes counseling the patient face to face.  Leary Roca, PA-C  01/08/2022 3:28 PM   Agree with above. Persistent pneumothorax.  Chest tube tubing was disconnected which likely is the cause of his recurrent pneumothorax.  His tube has been placed to a mini express, he does not have evidence of a leak.  If his chest x-ray is improved tomorrow likely will remove the chest tube.  Bishop Vanderwerf Keane Scrape

## 2022-01-08 NOTE — Consult Note (Signed)
Date of Admission:  12/28/2021          Reason for Consult: left septic olecranon bursitis   Referring Provider: Eleonore Chiquito, MD   Assessment:  Left septic run-on bursitis due to MRSA Near syncope and fall thought to be due to low blood pressure and the bradycardia with Right sided hemothorax and residual pneumothorax post chest tube placement Atrial fibrillation having been on anticoagulation before Acute kidney injury Possible UTI vs asymptomatic bacteruria Hyperkalemia Neri artery disease Alzheimer's type dementia  Plan:  Start Zyvox 600 mg twice daily orally with plans to complete 2 weeks of therapy  Will need a repeat CBC with differential monitored while he is on the Zyvox at least 1 week into therapy Although area clinically and if he fails to improve will need formal I&D by surgery We are happy to see him in the infectious disease clinic for follow-up   Principal Problem:   Hemothorax on right Active Problems:   Near syncope   AKI (acute kidney injury) ([place])   Hyperkalemia   A-fib (HCC)   Scheduled Meds:  acetaminophen  1,000 mg Oral TID   amLODipine  5 mg Oral Daily   Chlorhexidine Gluconate Cloth  6 each Topical Daily   colesevelam  1,250 mg Oral BID   docusate sodium  100 mg Oral BID   enzalutamide  160 mg Oral QHS   feeding supplement  237 mL Oral BID BM   heparin injection (subcutaneous)  5,000 Units Subcutaneous Q8H   lidocaine  1 patch Transdermal Q24H   linezolid  600 mg Oral Q12H   metoprolol succinate  50 mg Oral Daily   rosuvastatin  40 mg Oral Daily   sodium chloride flush  3 mL Intravenous Q12H   Continuous Infusions: PRN Meds:.bisacodyl, fentaNYL (SUBLIMAZE) injection, haloperidol lactate, methocarbamol, metoprolol tartrate, ondansetron (ZOFRAN) IV, polyethylene glycol, traMADol  HPI: Jacob Becker is a 88 y.o. male with dementia with chronic atrial fibrillation on Xarelto, hypertension and coronary disease prostate cancer who  presented to [facility] after near syncopal event and fall.  Currently on the day of admission the patient had been accidentally locked into his own bedroom.  Apparently when family knocked on the door he tried to open the door and then lost his balance and fell on his right side of his chest.  Reportedly there was no head injury or loss of consciousness.  The daughter however notes the patient looked pale and clammy and had slurred speech and was confused EMS arrived and the patient was found to be hypotensive in the [id number] systolic and also bradycardic with his heart rate into the 50s.  Currently at recent cardiology note in April 2023 the patient was found to be with borderline bradycardia and hypotension and cardiology recommended cutting down his metoprolol to 150 mg daily and also to cut his amlodipine to 5 mg if needed.  The patient apparently had been off all medications for a month but then restarted them 3 days prior to his injury.  In the ER he was hypotensive but responded to IV fluids.  He was unfortunately found to have a right-sided hemothorax with 11th rib fracture.  Chest tube was placed by trauma surgery in the ER.  On admission he was in acute renal failure with some hyperkalemia as well.  His hypotension and electrolyte abnormalities resolved with IV fluid hydration.  He had had a urine culture and urinalysis performed on admission and back  positive with very 100,000 colony-forming units of coli.  This appears to have been noted after he been in the hospital for 5 days and did not based on my looking through the notes have much to suggest an infectious pathology in his bladder.  Regardless he received 3 days of cefdinir.  His hospitalization there became concern for potential bursitis at the site of prior sutures in his elbow where he sustained a fracture.  He was initiated on Augmentin.  Orthopedic surgery saw the patient, clinically that higher suspicion for an inflammatory  process rather than septic arthritis right is bursitis.  The bursa was aspirated by Hilbert Odor, PA and sent for fluid analysis cell count and culture.  Count could not be performed as there was clotted specimen but differential showed 99% neutrophils and 1% lymphocytes and culture yielded methicillin resistant Staphylococcus aureus that was resistant to tetracycline.  He was switched from Augmentin to Bactrim 2 days ago.  Orthopedic surgery feels that he is doing well and is in no need of any surgical intervention at this point in time.  On exam the patient does indeed have some erythema and tenderness of the elbow but he has excellent range of motion and I agree that he is not have clinical evidence for a septic arthritis.  I will go ahead and change him over to Zyvox with great bioavailability in simplicity of dosing.  Will need to watch out for thrombocytopenia.  Plan on giving him a 2-week course but he should have a CBC at least a week into those 2 weeks and he should have follow-up with [place] and infectious disease.   I spent 84 minutes with the patient including than 50% of the time in face to face counseling of the patient regarding his septic olecranon bursitis his hemothorax his fall is atrial fibrillation anticoagulation, personally reviewing of the chest, along with review of medical records in preparation for the visit and during the visit and in coordination of nis care.     Review of Systems: Review of Systems  Unable to perform ROS: Dementia    History reviewed. No pertinent past medical history.     History reviewed. No pertinent family history. Not on File  OBJECTIVE: Blood pressure (!) 146/79, pulse 72, temperature 98.3 F (36.8 C), temperature source Axillary, resp. rate 18, height 5\' 10"  (1.778 m), weight 67.1 kg, SpO2 99 %.  Physical Exam Constitutional:      Appearance: He is well-developed.  HENT:     Head: Normocephalic and atraumatic.  Eyes:      Conjunctiva/sclera: Conjunctivae normal.  Cardiovascular:     Rate and Rhythm: Normal rate and regular rhythm.  Pulmonary:     Effort: Pulmonary effort is normal. No respiratory distress.     Breath sounds: No wheezing.  Abdominal:     General: There is no distension.     Palpations: Abdomen is soft.  Musculoskeletal:     Left elbow: Swelling present. Normal range of motion. Tenderness present.     Cervical back: Normal range of motion and neck supple.  Skin:    General: Skin is warm and dry.     Coloration: Skin is not pale.     Findings: No erythema or rash.  Neurological:     General: No focal deficit present.     Mental Status: He is alert.  Psychiatric:        Attention and Perception: Attention normal.        Mood and  Affect: Mood normal.        Speech: Speech is delayed.        Behavior: Behavior is cooperative.        Thought Content: Thought content normal.        Cognition and Memory: Cognition is impaired. Memory is impaired. He exhibits impaired recent memory and impaired remote memory.     Left elbow on day of aspiration 01/04/2022:    Left elbow 01/08/2022:     Lab Results Lab Results  Component Value Date   WBC 9.4 01/06/2022   HGB 8.5 (L) 01/06/2022   HCT 25.7 (L) 01/06/2022   MCV 96.3 01/06/2022   PLT 233 01/06/2022    Lab Results  Component Value Date   CREATININE 1.28 (H) 01/04/2022   BUN 34 (H) 01/04/2022   NA 136 01/04/2022   K 4.2 01/04/2022   CL 106 01/04/2022   CO2 22 01/04/2022    Lab Results  Component Value Date   ALT 17 12/28/2021   AST 32 12/28/2021   ALKPHOS 126 12/28/2021   BILITOT 0.6 12/28/2021     Microbiology: Recent Results (from the past 240 hour(s))  Body fluid culture w Gram Stain     Status: None   Collection Time: 01/04/22  4:02 PM   Specimen: Synovium; Body Fluid  Result Value Ref Range Status   Specimen Description SYNOVIAL  Final   Special Requests BURSA  Final   Gram Stain   Final    FEW WBC PRESENT,  PREDOMINANTLY PMN RARE GRAM POSITIVE COCCI Performed at [facility], [address], [place], [place] [address]    Culture RARE METHICILLIN RESISTANT STAPHYLOCOCCUS AUREUS  Final   Report Status 01/07/2022 FINAL  Final   Organism ID, Bacteria METHICILLIN RESISTANT STAPHYLOCOCCUS AUREUS  Final      Susceptibility   Methicillin resistant staphylococcus aureus - MIC*    CIPROFLOXACIN >=8 RESISTANT Resistant     ERYTHROMYCIN >=8 RESISTANT Resistant     GENTAMICIN <=0.5 SENSITIVE Sensitive     OXACILLIN >=4 RESISTANT Resistant     TETRACYCLINE >=16 RESISTANT Resistant     VANCOMYCIN 1 SENSITIVE Sensitive     TRIMETH/SULFA <=10 SENSITIVE Sensitive     CLINDAMYCIN <=0.25 SENSITIVE Sensitive     RIFAMPIN <=0.5 SENSITIVE Sensitive     Inducible Clindamycin NEGATIVE Sensitive     * RARE METHICILLIN RESISTANT STAPHYLOCOCCUS AUREUS    Acey Lav, MD [facility] [phone] pager  01/08/2022, 4:05 PM

## 2022-01-08 NOTE — Progress Notes (Addendum)
Progress Note     Subjective: Resting comfortably, NAD. No family at bedside.  Objective: Vital signs in last 24 hours: Temp:  [97.7 F (36.5 C)-98.3 F (36.8 C)] 98.3 F (36.8 C) (12/12 0744) Pulse Rate:  [55-80] 72 (12/12 0744) Resp:  [16-18] 18 (12/12 0744) BP: (121-146)/(58-89) 146/79 (12/12 0744) SpO2:  [96 %-99 %] 99 % (12/12 0744) Weight:  [67.1 kg] 67.1 kg (12/12 0208) Last BM Date : 01/05/22  Intake/Output from previous day: 12/11 0701 - 12/12 0700 In: -  Out: 1260 [Urine:1250; Chest Tube:10] Intake/Output this shift: Total I/O In: 440 [P.O.:440] Out: -   PE: General: pleasant, WD, elderly male who is laying in bed in NAD Heart: regular, rate, and rhythm.   Lungs: CTAB, no wheezes, rhonchi, or rales noted.  Respiratory effort nonlabored. R CT without airleak and SS fluid in tubing. Placed to -20 cm suction by me.  MS: L elbow with erythema and scabbing, ROM grossly intact    Lab Results:  Recent Labs    01/06/22 0424  WBC 9.4  HGB 8.5*  HCT 25.7*  PLT 233   BMET No results for input(s): "NA", "K", "CL", "CO2", "GLUCOSE", "BUN", "CREATININE", "CALCIUM" in the last 72 hours. PT/INR No results for input(s): "LABPROT", "INR" in the last 72 hours. CMP     Component Value Date/Time   NA 136 01/04/2022 0320   K 4.2 01/04/2022 0320   CL 106 01/04/2022 0320   CO2 22 01/04/2022 0320   GLUCOSE 112 (H) 01/04/2022 0320   BUN 34 (H) 01/04/2022 0320   CREATININE 1.28 (H) 01/04/2022 0320   CALCIUM 8.3 (L) 01/04/2022 0320   PROT 5.4 (L) 12/28/2021 1043   ALBUMIN 2.5 (L) 12/28/2021 1043   AST 32 12/28/2021 1043   ALT 17 12/28/2021 1043   ALKPHOS 126 12/28/2021 1043   BILITOT 0.6 12/28/2021 1043   GFRNONAA 53 (L) 01/04/2022 0320   Lipase  No results found for: "LIPASE"     Studies/Results: DG CHEST PORT 1 VIEW  Result Date: 01/08/2022 CLINICAL DATA:  Pneumothorax. EXAM: PORTABLE CHEST 1 VIEW COMPARISON:  Radiograph 01/07/2022 FINDINGS: Small  volume volume RIGHT pneumothorax appreciated RIGHT lung apex and along the RIGHT lateral hemithorax. Slightly increased from prior. RIGHT chest tube in place. Pleural fluid at the RIGHT lung base similar prior. Stable cardiac silhouette with prominent hilar vascular structures no pulmonary edema. No infiltrate. Sclerotic bones consistent with known osseous metastasis IMPRESSION: 1. small volume RIGHT pneumothorax with chest tube in place. Slight increase in volume. 2. RIGHT pleural fluid unchanged Electronically Signed   By: Suzy Bouchard M.D.   On: 01/08/2022 08:26   DG Chest Port 1 View  Result Date: 01/07/2022 CLINICAL DATA:  Right sided chest tube, pneumothorax. EXAM: PORTABLE CHEST 1 VIEW COMPARISON:  January 06, 2022. FINDINGS: Stable cardiomediastinal silhouette. Right-sided chest tube is unchanged in position. Minimal right apical pneumothorax is noted. Mild left basilar atelectasis is noted. Stable right basilar atelectasis is noted as well. Bony thorax is unremarkable. IMPRESSION: Stable position of right-sided chest tube with minimal right apical pneumothorax. Electronically Signed   By: Marijo Conception M.D.   On: 01/07/2022 08:01    Anti-infectives: Anti-infectives (From admission, onward)    Start     Dose/Rate Route Frequency Ordered Stop   01/07/22 1400  sulfamethoxazole-trimethoprim (BACTRIM DS) 800-160 MG per tablet 1 tablet        1 tablet Oral Every 12 hours 01/07/22 1309     01/03/22  1215  amoxicillin-clavulanate (AUGMENTIN) 875-125 MG per tablet 1 tablet  Status:  Discontinued        1 tablet Oral Every 12 hours 01/03/22 1125 01/07/22 1309   01/01/22 1345  cefdinir (OMNICEF) capsule 300 mg  Status:  Discontinued        300 mg Oral Every 12 hours 01/01/22 1259 01/03/22 1125   12/28/21 1215  cefTRIAXone (ROCEPHIN) 1 g in sodium chloride 0.9 % 100 mL IVPB  Status:  Discontinued        1 g 200 mL/hr over 30 Minutes Intravenous  Once 12/28/21 1205 12/28/21 1320   12/28/21 1215   azithromycin (ZITHROMAX) 500 mg in sodium chloride 0.9 % 250 mL IVPB  Status:  Discontinued        500 mg 250 mL/hr over 60 Minutes Intravenous  Once 12/28/21 1205 12/28/21 1320        Assessment/Plan  Fall on xarelto   R hemothorax - s/p chest tube placement 12/2.  CXR with interval inrease in PTX last 24 hours. 10-40 mL SS output last 24h. Place back to -20 cm suction. Unfortunately his lung keeps dropping when he is placed to [facility]. No obvious air leak or large blebs on CT. I will discuss his plan of care with Dr. Bedelia Person and likely with TCTS as well. May need repeat chest CT. R rib fractures 10-11 - multimodal pain control and pulm toilet ABL anemia - s/p 1u PRBCs 12/1. Hgb 8.5 12/10, stable Right anterior acetabular rib fx - per ortho, Dr. Sherilyn Dacosta, nonop, WBAT RLE Left elbow lac - from previous fall. Sutures removed 12/7.  Cellulitis improved, management per ortho   Chronic A fib on xarelto - would consider holding anticoagulation indefinitely  Chronic urinary retention - I&O caths at home. Foley placed by urology 12/2.  Prostate cancer HTN HLD   ID - none currently FEN - IVF per TRH, HH/CM diet VTE - SCDs, sq heparin Foley - placed by urology 12/2   Dispo: R chest tube management, will need to call family once update plan of care is determined.    LOS: 11 days     Adam Phenix, [facility] 01/08/2022, 9:31 AM Please see Amion for pager number during day hours 7:00am-4:30pm

## 2022-01-09 ENCOUNTER — Inpatient Hospital Stay (HOSPITAL_COMMUNITY): Payer: Medicare Other

## 2022-01-09 ENCOUNTER — Telehealth (HOSPITAL_COMMUNITY): Payer: Self-pay | Admitting: Pharmacy Technician

## 2022-01-09 ENCOUNTER — Other Ambulatory Visit (HOSPITAL_COMMUNITY): Payer: Self-pay

## 2022-01-09 DIAGNOSIS — W19XXXA Unspecified fall, initial encounter: Secondary | ICD-10-CM | POA: Diagnosis not present

## 2022-01-09 DIAGNOSIS — I4811 Longstanding persistent atrial fibrillation: Secondary | ICD-10-CM | POA: Diagnosis not present

## 2022-01-09 DIAGNOSIS — R55 Syncope and collapse: Secondary | ICD-10-CM | POA: Diagnosis not present

## 2022-01-09 DIAGNOSIS — J942 Hemothorax: Secondary | ICD-10-CM | POA: Diagnosis not present

## 2022-01-09 DIAGNOSIS — Z515 Encounter for palliative care: Secondary | ICD-10-CM

## 2022-01-09 LAB — GLUCOSE, CAPILLARY: Glucose-Capillary: 136 mg/dL — ABNORMAL HIGH (ref 70–99)

## 2022-01-09 MED ORDER — MEDIHONEY WOUND/BURN DRESSING EX PSTE
1.0000 | PASTE | Freq: Every day | CUTANEOUS | Status: DC
  Administered 2022-01-09 – 2022-01-10 (×2): 1 via TOPICAL
  Filled 2022-01-09: qty 44

## 2022-01-09 NOTE — Consult Note (Signed)
Consultation Note Date: 01/09/2022 at 1330  Patient Name: Jacob Becker  DOB: 12-21-1929  MRN: [id number]  Age / Sex: 88 y.o., male  PCP: Jacob Frames, MD Referring Physician: Aline August, MD  Reason for Consultation: Establishing goals of care  HPI/Patient Profile: 88 y.o. male  with past medical history of advanced dementia, A-fib (Eliquis), HTN, CAD, prostate cancer (remission on chronic hormonal manipulation 2), hepatic and pancreatic cyst admitted on 12/28/2021 with near syncope and fall.  Patient sustained rib fractures with right hemothorax.  Chest tube placed.  Found also to have right anterior ISAT Buehler rib fracture.  Ortho was consulted and are recommending conservative management.  Chest tube was removed today.  PMT was consulted go goals of care discussion.   Clinical Assessment and Goals of Care: I have reviewed medical records including EPIC notes, labs and imaging, assessed the patient and then met with patient and his son Jacob Becker to discuss diagnosis prognosis, GOC, EOL wishes, disposition and options.  I introduced Palliative Medicine as specialized medical care for people living with serious illness. It focuses on providing relief from the symptoms and stress of a serious illness. The goal is to improve quality of life for both the patient and the family.  We discussed a brief life review of the patient. 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 He has been married for over 52 years and has 3 children (2 sons, 1 daughter).   I attempted to discussed patient's current illness and what it means in the larger context of patient's on-going co-morbidities. However, patient changed subject often from discussion of his current medical condition to politics.   I attempted to elicit values and goals of care important to the patient. Patient shares that his daughter is  his HCPOA and that she is aware of all of his wishes. He declined to further discuss goals/wishes at this time.    Family is facing treatment option decisions, advanced directive, and anticipatory care needs.    Discussed with patient/family the importance of continued conversation with family and the medical providers regarding overall plan of care and treatment options, ensuring decisions are within the context of the patient's values and GOCs.    Palliative Care services outpatient were explained and offered. Son was interested and in agreement. Referral placed.   Questions and concerns were addressed. The family was encouraged to call with questions or concerns.   PMT will remain available to patient/family throughout his hospitalization.   Primary Decision Maker PATIENT  Physical Exam Constitutional:      General: He is not in acute distress.    Appearance: He is normal weight. He is not ill-appearing.  HENT:     Head: Normocephalic.     Mouth/Throat:     Mouth: Mucous membranes are moist.  Eyes:     Pupils: Pupils are equal, round, and reactive to light.  Cardiovascular:     Rate and Rhythm: Normal rate. Rhythm irregular.  Pulmonary:     Effort:  Pulmonary effort is normal.  Abdominal:     Palpations: Abdomen is soft.  Musculoskeletal:     Comments: Generalized weakness  Skin:    General: Skin is warm and dry.  Neurological:     Mental Status: He is alert and oriented to person, place, and time.  Psychiatric:        Mood and Affect: Mood normal.        Behavior: Behavior normal.        Thought Content: Thought content normal.        Judgment: Judgment normal.     Palliative Assessment/Data: 50%     Thank you for this consult. Palliative medicine will continue to follow and assist holistically.   Time Total: 75 minutes Greater than 50%  of this time was spent counseling and coordinating care related to the above assessment and plan.  Signed by: Jacob Hawks, DNP, FNP-BC Palliative Medicine    Please contact Palliative Medicine Team phone at [phone] for questions and concerns.  For individual provider: See Jacob Becker

## 2022-01-09 NOTE — Progress Notes (Signed)
Patient continues to refuse Tele; MD made aware.

## 2022-01-09 NOTE — TOC Benefit Eligibility Note (Signed)
Patient Product/process development scientist completed.    The patient is currently admitted and upon discharge could be taking linezolid (Zyvox) 100 mg tablets.  Requires Prior Authorization  The patient is insured through [facility] Medicare Part D   Roland Earl, CPHT Pharmacy Patient Advocate Specialist [facility] Patient Advocate Team Direct Number: [phone]  Fax: [phone]

## 2022-01-09 NOTE — Telephone Encounter (Signed)
Patient Advocate Encounter  Prior Authorization for Linezolid 600MG  tablets has been approved.     Effective dates: 01/09/2022 through 04/10/2022  Patients co-pay is $13.81.     04/12/2022, CPhT Pharmacy Patient Advocate Specialist [facility] Patient Advocate Team Direct Number: [phone]  Fax: [phone]

## 2022-01-09 NOTE — Progress Notes (Addendum)
      [address]       [place] [address]             [phone]           Subjective: Patient just fell asleep. He briefly awakened. He coughed when asked to do so. He asked if it hurts when chest tube is removed.  Objective: Vital signs in last 24 hours: Temp:  [97.7 F (36.5 C)-98 F (36.7 C)] 98 F (36.7 C) (12/13 0415) Pulse Rate:  [58-71] 71 (12/13 0415) Cardiac Rhythm: Atrial fibrillation (12/12 1900) Resp:  [16-18] 16 (12/13 0415) BP: (116-145)/(56-76) 145/76 (12/13 0415) SpO2:  [96 %-98 %] 96 % (12/13 0415) Weight:  [70.9 kg] 70.9 kg (12/13 0415)     Intake/Output from previous day: 12/12 0701 - 12/13 0700 In: 1120 [P.O.:1120] Out: 825 [Urine:675; Chest Tube:150]   Physical Exam:  Cardiovascular: IRRR IRRR Pulmonary: Clear to auscultation bilaterally, some subcutaneous emphysema right lateral chest Wounds: Dressing is clean and dry.  Chest Tube: to a mini express, no air leak  Lab Results: CBC:No results for input(s): "WBC", "HGB", "HCT", "PLT" in the last 72 hours. BMET: No results for input(s): "NA", "K", "CL", "CO2", "GLUCOSE", "BUN", "CREATININE", "CALCIUM" in the last 72 hours.  PT/INR: No results for input(s): "LABPROT", "INR" in the last 72 hours. ABG:  INR: Will add last result for INR, ABG once components are confirmed Will add last 4 CBG results once components are confirmed  Assessment/Plan:  1. CV - History of chronic a fib;a fib with CVR. On Toprol XL 50 mg daily, Amlodipine 5 mg daily. He was on Xarelto prior to admission but with fall and rib fractures/hemothorax on hold 2.  Pulmonary - Right chest tube is to a mini express. Chest tube output recorded as 150 cc for 12 hours (dark blood). There is no air leak.  CXR this am appears to show small right apical and ? Small lateral pneumothoraces. As discussed with Dr. Cliffton Asters, clamp chest tube and obtain CXR in about 4 hours. If CXR stable, will remove chest  tube.   Ailey Wessling M ZimmermanPA-C 01/09/2022,8:01 AM

## 2022-01-09 NOTE — Consult Note (Signed)
WOC Nurse Consult Note: Reason for Consult: LEft elbow erythema.  Concerns for infection left olecranon bursa.  Is on Zyvox.  ID has consulted and scheduled to follow outpatient.  Daughter and patient state that wound is improving.   Right elbow with abrasions from fall as well.   Wound type:infectious/trauma from fall Pressure Injury POA: NA Measurement:Left elbow:  1 cm x 0.3 cm lesion with fibrin to wound bed.  Erythema present circumferentially, extends 4 cm, on Zyvox and MRSA (Contact) precautions Wound bed: fibrin slough Drainage (amount, consistency, odor) minimal serosanguinous  no odor Periwound: No pain, does have erythema to periwound on left elbow Daughter had stepped out of room, but I spoke with her via phone. Updated her on my recommendations. Bedside Rn will perform teaching on dressing change.  Dressing procedure/placement/frequency: Cleanse wounds on both elbows with NS and pat dry. Apply medihoney to open areas and cover with dry dressing.  Change daily.  Will not follow at this time.  Please re-consult if needed.  Mike Gip MSN, RN, FNP-BC CWON Wound, Ostomy, Continence Nurse [facility] [phone] Pager [phone]

## 2022-01-09 NOTE — Progress Notes (Signed)
[facility] Progress Note     Subjective: CC-  Air cabin crew at bedside. Patient did not sleep at all last night. States that he is tired this morning. Denies any pain or SOB.  Objective: Vital signs in last 24 hours: Temp:  [97.7 F (36.5 C)-98 F (36.7 C)] 98 F (36.7 C) (12/13 0415) Pulse Rate:  [58-71] 71 (12/13 0415) Resp:  [16-18] 16 (12/13 0415) BP: (116-145)/(56-76) 145/76 (12/13 0415) SpO2:  [96 %-98 %] 96 % (12/13 0415) Weight:  [70.9 kg] 70.9 kg (12/13 0415) Last BM Date : 01/05/22  Intake/Output from previous day: 12/12 0701 - 12/13 0700 In: 1120 [P.O.:1120] Out: 825 [Urine:675; Chest Tube:150] Intake/Output this shift: Total I/O In: 120 [P.O.:120] Out: -    PE: General: pleasant, WD, elderly male who is laying in bed in NAD Heart: regular, rate, and rhythm.   Lungs: CTAB, no wheezes, rhonchi, or rales noted.  Respiratory effort nonlabored. R CT without airleak, 150cc dark bloody output MS: L elbow with improved erythema/edema, scabbing present without purulent drainage, ROM grossly intact   Lab Results:  No results for input(s): "WBC", "HGB", "HCT", "PLT" in the last 72 hours. BMET No results for input(s): "NA", "K", "CL", "CO2", "GLUCOSE", "BUN", "CREATININE", "CALCIUM" in the last 72 hours. PT/INR No results for input(s): "LABPROT", "INR" in the last 72 hours. CMP     Component Value Date/Time   NA 136 01/04/2022 0320   K 4.2 01/04/2022 0320   CL 106 01/04/2022 0320   CO2 22 01/04/2022 0320   GLUCOSE 112 (H) 01/04/2022 0320   BUN 34 (H) 01/04/2022 0320   CREATININE 1.28 (H) 01/04/2022 0320   CALCIUM 8.3 (L) 01/04/2022 0320   PROT 5.4 (L) 12/28/2021 1043   ALBUMIN 2.5 (L) 12/28/2021 1043   AST 32 12/28/2021 1043   ALT 17 12/28/2021 1043   ALKPHOS 126 12/28/2021 1043   BILITOT 0.6 12/28/2021 1043   GFRNONAA 53 (L) 01/04/2022 0320   Lipase  No results found for: "LIPASE"     Studies/Results: DG CHEST PORT 1 VIEW  Result  Date: 01/08/2022 CLINICAL DATA:  Follow-up pneumothorax. EXAM: PORTABLE CHEST 1 VIEW COMPARISON:  Radiograph earlier today, additional priors reviewed. FINDINGS: Small pneumothorax is similar or slightly diminished in size from prior exam, currently visualized the level of the posterior third rib at the apex. Lateral component is potentially but not definitively seen. Right chest tube remains in place unchanged in position. Persistent bandlike right perihilar opacity. Small volume persistent right pleural fluid. No other interval change. IMPRESSION: 1. Unchanged or slightly diminished size of right pneumothorax. Right chest tube remains in place. 2. Unchanged bandlike right perihilar opacity and small volume right pleural fluid. Electronically Signed   By: Keith Rake M.D.   On: 01/08/2022 18:32   DG CHEST PORT 1 VIEW  Result Date: 01/08/2022 CLINICAL DATA:  Pneumothorax. EXAM: PORTABLE CHEST 1 VIEW COMPARISON:  Radiograph 01/07/2022 FINDINGS: Small volume volume RIGHT pneumothorax appreciated RIGHT lung apex and along the RIGHT lateral hemithorax. Slightly increased from prior. RIGHT chest tube in place. Pleural fluid at the RIGHT lung base similar prior. Stable cardiac silhouette with prominent hilar vascular structures no pulmonary edema. No infiltrate. Sclerotic bones consistent with known osseous metastasis IMPRESSION: 1. small volume RIGHT pneumothorax with chest tube in place. Slight increase in volume. 2. RIGHT pleural fluid unchanged Electronically Signed   By: Suzy Bouchard M.D.   On: 01/08/2022 08:26    Anti-infectives: Anti-infectives (From admission, onward)  Start     Dose/Rate Route Frequency Ordered Stop   01/08/22 2200  linezolid (ZYVOX) tablet 600 mg        600 mg Oral Every 12 hours 01/08/22 1604     01/07/22 1400  sulfamethoxazole-trimethoprim (BACTRIM DS) 800-160 MG per tablet 1 tablet  Status:  Discontinued        1 tablet Oral Every 12 hours 01/07/22 1309 01/08/22 1604    01/03/22 1215  amoxicillin-clavulanate (AUGMENTIN) 875-125 MG per tablet 1 tablet  Status:  Discontinued        1 tablet Oral Every 12 hours 01/03/22 1125 01/07/22 1309   01/01/22 1345  cefdinir (OMNICEF) capsule 300 mg  Status:  Discontinued        300 mg Oral Every 12 hours 01/01/22 1259 01/03/22 1125   12/28/21 1215  cefTRIAXone (ROCEPHIN) 1 g in sodium chloride 0.9 % 100 mL IVPB  Status:  Discontinued        1 g 200 mL/hr over 30 Minutes Intravenous  Once 12/28/21 1205 12/28/21 1320   12/28/21 1215  azithromycin (ZITHROMAX) 500 mg in sodium chloride 0.9 % 250 mL IVPB  Status:  Discontinued        500 mg 250 mL/hr over 60 Minutes Intravenous  Once 12/28/21 1205 12/28/21 1320        Assessment/Plan Fall on xarelto   R hemothorax - s/p chest tube placement 12/2.  Given small recurrent PTX with WS trials TCTS was consulted 12/12, he was placed to a mini express, management per Dr. Cliffton Asters R rib fractures 10-11 - multimodal pain control and pulm toilet ABL anemia - s/p 1u PRBCs 12/1. Hgb 8.5 12/10, stable Right anterior acetabular rib fx - per ortho, Dr. Sherilyn Dacosta, nonop, WBAT RLE Left elbow lac/ olecranon bursitis - from previous fall. Sutures removed 12/7.  s/p aspiration 12/8 by ortho, growing MRSA, on zyvox per ID. Management per ortho/ID   Chronic A fib on xarelto - would consider holding anticoagulation indefinitely  Chronic urinary retention - I&O caths at home. Foley placed by urology 12/2.  Prostate cancer HTN HLD   ID - linezolid 12/12>> FEN - HH/CM diet VTE - SCDs, sq heparin Foley - placed by urology 12/2     LOS: 12 days    Franne Forts, [facility] 01/09/2022, 8:10 AM Please see Amion for pager number during day hours 7:00am-4:30pm a

## 2022-01-09 NOTE — TOC Progression Note (Signed)
Transition of Care [facility]) - Progression Note    Patient Details  Name: Jacob Becker MRN: [id number] Date of Birth: 04-17-1929  Transition of Care [facility]) CM/SW Contact  Carley Hammed, [place] Phone Number: 01/09/2022, 11:52 AM  Clinical Narrative:    CSW was notified by MD pt is NMR to DC today. Will need to DC sitter as well. Plan to DC tomorrow if able. CSW followed up with [facility] and they will be available tomorrow for an admission. CSW spoke with dtr and discussed barriers. Dtr notes understanding, but concern about pt not having a sitter. TOC will continue to follow for DC needs.   Expected Discharge Plan: Home w Home Health Services Barriers to Discharge: Continued Medical Work up  Expected Discharge Plan and Services Expected Discharge Plan: Home w Home Health Services   Discharge Planning Services: CM Consult Post Acute Care Choice: Home Health Living arrangements for the past 2 months: Single Family Home                 DME Arranged:  (awaiting decision walker vs rollator)         HH Arranged: RN, PT, OT HH Agency: Other - See comment ([facility]) Date HH Agency Contacted: 12/31/21 Time HH Agency Contacted: 1425 Representative spoke with at [facility] Agency: Eber Jones   Social Determinants of Health (SDOH) Interventions    Readmission Risk Interventions     No data to display

## 2022-01-09 NOTE — Progress Notes (Signed)
Subjective: No new complaints   Antibiotics:  Anti-infectives (From admission, onward)    Start     Dose/Rate Route Frequency Ordered Stop   01/08/22 2200  linezolid (ZYVOX) tablet 600 mg        600 mg Oral Every 12 hours 01/08/22 1604     01/07/22 1400  sulfamethoxazole-trimethoprim (BACTRIM DS) 800-160 MG per tablet 1 tablet  Status:  Discontinued        1 tablet Oral Every 12 hours 01/07/22 1309 01/08/22 1604   01/03/22 1215  amoxicillin-clavulanate (AUGMENTIN) 875-125 MG per tablet 1 tablet  Status:  Discontinued        1 tablet Oral Every 12 hours 01/03/22 1125 01/07/22 1309   01/01/22 1345  cefdinir (OMNICEF) capsule 300 mg  Status:  Discontinued        300 mg Oral Every 12 hours 01/01/22 1259 01/03/22 1125   12/28/21 1215  cefTRIAXone (ROCEPHIN) 1 g in sodium chloride 0.9 % 100 mL IVPB  Status:  Discontinued        1 g 200 mL/hr over 30 Minutes Intravenous  Once 12/28/21 1205 12/28/21 1320   12/28/21 1215  azithromycin (ZITHROMAX) 500 mg in sodium chloride 0.9 % 250 mL IVPB  Status:  Discontinued        500 mg 250 mL/hr over 60 Minutes Intravenous  Once 12/28/21 1205 12/28/21 1320       Medications: Scheduled Meds:  acetaminophen  1,000 mg Oral TID   amLODipine  5 mg Oral Daily   Chlorhexidine Gluconate Cloth  6 each Topical Daily   colesevelam  1,250 mg Oral BID   docusate sodium  100 mg Oral BID   enzalutamide  160 mg Oral QHS   feeding supplement  237 mL Oral BID BM   heparin injection (subcutaneous)  5,000 Units Subcutaneous Q8H   lidocaine  1 patch Transdermal Q24H   linezolid  600 mg Oral Q12H   metoprolol succinate  50 mg Oral Daily   rosuvastatin  40 mg Oral Daily   sodium chloride flush  3 mL Intravenous Q12H   Continuous Infusions: PRN Meds:.bisacodyl, fentaNYL (SUBLIMAZE) injection, haloperidol lactate, methocarbamol, metoprolol tartrate, ondansetron (ZOFRAN) IV, polyethylene glycol, traMADol    Objective: Weight change: 3.8  kg  Intake/Output Summary (Last 24 hours) at 01/09/2022 1108 Last data filed at 01/09/2022 0749 Gross per 24 hour  Intake 800 ml  Output 825 ml  Net -25 ml   Blood pressure 121/62, pulse (!) 54, temperature 98.2 F (36.8 C), temperature source Oral, resp. rate 18, height 5\' 10"  (1.778 m), weight 70.9 kg, SpO2 98 %. Temp:  [97.7 F (36.5 C)-98.2 F (36.8 C)] 98.2 F (36.8 C) (12/13 1023) Pulse Rate:  [54-71] 54 (12/13 1023) Resp:  [16-18] 18 (12/13 1023) BP: (116-145)/(56-76) 121/62 (12/13 1023) SpO2:  [96 %-98 %] 98 % (12/13 1023) Weight:  [70.9 kg] 70.9 kg (12/13 0415)  Physical Exam: Physical Exam Constitutional:      Appearance: He is well-developed.  HENT:     Head: Normocephalic and atraumatic.  Eyes:     Conjunctiva/sclera: Conjunctivae normal.  Cardiovascular:     Rate and Rhythm: Rhythm irregular.  Pulmonary:     Effort: Pulmonary effort is normal. No respiratory distress.     Breath sounds: No wheezing.  Abdominal:     General: There is no distension.  Musculoskeletal:        General: Normal range of motion.  Cervical back: Normal range of motion and neck supple.  Skin:    General: Skin is warm and dry.     Findings: No erythema or rash.  Neurological:     General: No focal deficit present.     Mental Status: He is alert.  Psychiatric:        Mood and Affect: Mood is anxious.        Speech: Speech is delayed.        Cognition and Memory: Cognition is impaired. Memory is impaired. He exhibits impaired recent memory and impaired remote memory.    [place] 01/09/2022:     CBC:    BMET No results for input(s): "NA", "K", "CL", "CO2", "GLUCOSE", "BUN", "CREATININE", "CALCIUM" in the last 72 hours.   Liver Panel  No results for input(s): "PROT", "ALBUMIN", "AST", "ALT", "ALKPHOS", "BILITOT", "BILIDIR", "IBILI" in the last 72 hours.     Sedimentation Rate No results for input(s): "ESRSEDRATE" in the last 72 hours. C-Reactive Protein No  results for input(s): "CRP" in the last 72 hours.  Micro Results: Recent Results (from the past 720 hour(s))  Culture, blood (routine x 2)     Status: None   Collection Time: 12/28/21 10:29 AM   Specimen: BLOOD RIGHT HAND  Result Value Ref Range Status   Specimen Description BLOOD RIGHT HAND  Final   Special Requests   Final    BOTTLES DRAWN AEROBIC AND ANAEROBIC Blood Culture results may not be optimal due to an inadequate volume of blood received in culture bottles   Culture   Final    NO GROWTH 5 DAYS Performed at [facility], [address], [place], [place] [address]    Report Status 01/02/2022 FINAL  Final  Culture, blood (routine x 2)     Status: None   Collection Time: 12/28/21 10:34 AM   Specimen: BLOOD RIGHT ARM  Result Value Ref Range Status   Specimen Description BLOOD RIGHT ARM  Final   Special Requests   Final    BOTTLES DRAWN AEROBIC AND ANAEROBIC Blood Culture adequate volume   Culture   Final    NO GROWTH 5 DAYS Performed at [facility], [address], [place], [place] [address]    Report Status 01/02/2022 FINAL  Final  Urine Culture     Status: Abnormal   Collection Time: 12/29/21  7:38 AM   Specimen: Urine, Catheterized  Result Value Ref Range Status   Specimen Description URINE, CATHETERIZED  Final   Special Requests   Final    NONE Performed at [facility], [address], [place], [place] [address]    Culture >=100,000 COLONIES/mL ESCHERICHIA COLI (A)  Final   Report Status 12/31/2021 FINAL  Final   Organism ID, Bacteria ESCHERICHIA COLI (A)  Final      Susceptibility   Escherichia coli - MIC*    AMPICILLIN 4 SENSITIVE Sensitive     CEFAZOLIN <=4 SENSITIVE Sensitive     CEFEPIME <=0.12 SENSITIVE Sensitive     CEFTRIAXONE <=0.25 SENSITIVE Sensitive     CIPROFLOXACIN <=0.25 SENSITIVE Sensitive     GENTAMICIN <=1 SENSITIVE Sensitive     IMIPENEM <=0.25 SENSITIVE Sensitive     NITROFURANTOIN <=16 SENSITIVE Sensitive      TRIMETH/SULFA <=20 SENSITIVE Sensitive     AMPICILLIN/SULBACTAM <=2 SENSITIVE Sensitive     PIP/TAZO <=4 SENSITIVE Sensitive     * >=100,000 COLONIES/mL ESCHERICHIA COLI  Body fluid culture w Gram Stain  Status: None   Collection Time: 01/04/22  4:02 PM   Specimen: Synovium; Body Fluid  Result Value Ref Range Status   Specimen Description SYNOVIAL  Final   Special Requests BURSA  Final   Gram Stain   Final    FEW WBC PRESENT, PREDOMINANTLY PMN RARE GRAM POSITIVE COCCI Performed at [facility], [address], [place], [place] [address]    Culture RARE METHICILLIN RESISTANT STAPHYLOCOCCUS AUREUS  Final   Report Status 01/07/2022 FINAL  Final   Organism ID, Bacteria METHICILLIN RESISTANT STAPHYLOCOCCUS AUREUS  Final      Susceptibility   Methicillin resistant staphylococcus aureus - MIC*    CIPROFLOXACIN >=8 RESISTANT Resistant     ERYTHROMYCIN >=8 RESISTANT Resistant     GENTAMICIN <=0.5 SENSITIVE Sensitive     OXACILLIN >=4 RESISTANT Resistant     TETRACYCLINE >=16 RESISTANT Resistant     VANCOMYCIN 1 SENSITIVE Sensitive     TRIMETH/SULFA <=10 SENSITIVE Sensitive     CLINDAMYCIN <=0.25 SENSITIVE Sensitive     RIFAMPIN <=0.5 SENSITIVE Sensitive     Inducible Clindamycin NEGATIVE Sensitive     * RARE METHICILLIN RESISTANT STAPHYLOCOCCUS AUREUS    Studies/Results: DG CHEST PORT 1 VIEW  Result Date: 01/09/2022 CLINICAL DATA:  Right-sided chest tube.  History of pneumothorax EXAM: PORTABLE CHEST 1 VIEW COMPARISON:  Yesterday FINDINGS: Right-sided chest tube has been retracted. Midline trachea. Mild cardiomegaly. The left costophrenic angle is excluded. No right or large left pleural effusion. 5% right apical pneumothorax felt to be similar. No congestive failure. Slight increase in right perihilar bandlike opacity. Moderate right hemidiaphragm elevation. Subtle right upper lobe airspace disease is similar. Hyperinflation suggests COPD. IMPRESSION: Similar 5% right apical  pneumothorax with right chest tube in place. Slight increased right perihilar bandlike opacity which could represent airspace disease and/or fluid in the fissure. Other areas of right-sided airspace disease are mild and similar. Cardiomegaly and hyperinflation. Electronically Signed   By: Jeronimo Greaves M.D.   On: 01/09/2022 08:15   DG CHEST PORT 1 VIEW  Result Date: 01/08/2022 CLINICAL DATA:  Follow-up pneumothorax. EXAM: PORTABLE CHEST 1 VIEW COMPARISON:  Radiograph earlier today, additional priors reviewed. FINDINGS: Small pneumothorax is similar or slightly diminished in size from prior exam, currently visualized the level of the posterior third rib at the apex. Lateral component is potentially but not definitively seen. Right chest tube remains in place unchanged in position. Persistent bandlike right perihilar opacity. Small volume persistent right pleural fluid. No other interval change. IMPRESSION: 1. Unchanged or slightly diminished size of right pneumothorax. Right chest tube remains in place. 2. Unchanged bandlike right perihilar opacity and small volume right pleural fluid. Electronically Signed   By: Narda Rutherford M.D.   On: 01/08/2022 18:32   DG CHEST PORT 1 VIEW  Result Date: 01/08/2022 CLINICAL DATA:  Pneumothorax. EXAM: PORTABLE CHEST 1 VIEW COMPARISON:  Radiograph 01/07/2022 FINDINGS: Small volume volume RIGHT pneumothorax appreciated RIGHT lung apex and along the RIGHT lateral hemithorax. Slightly increased from prior. RIGHT chest tube in place. Pleural fluid at the RIGHT lung base similar prior. Stable cardiac silhouette with prominent hilar vascular structures no pulmonary edema. No infiltrate. Sclerotic bones consistent with known osseous metastasis IMPRESSION: 1. small volume RIGHT pneumothorax with chest tube in place. Slight increase in volume. 2. RIGHT pleural fluid unchanged Electronically Signed   By: Genevive Bi M.D.   On: 01/08/2022 08:26       Assessment/Plan:  INTERVAL HISTORY: patient tolerating zyvox  Principal Problem:   Hemothorax on right Active Problems:   Near syncope   AKI (acute kidney injury) ([place])   Hyperkalemia   A-fib ([place])    PEARL FRANCIS is a 88 y.o. male with [place] atrial fibrillation on anticoagulation and beta-blocker who fell the context of hypotension and bradycardia with rib fractures and hemothorax status post chest tube placement.  Concerns arose for his left olecranon bursa being infected.  I talked to his daughter today and she said that the erythema had progressed and was going up his arm prior to his being hospitalized.  She is has improved.  Certainly he has excellent range of motion.  We will plan on giving him 2 weeks of anti-MRSA coverage and finishing this with Zyvox which we started yesterday.  I have scheduled him for follow-up in our clinic   RHYLEN KIMMES has an appointment on 01/18/2022 at [place] with Dr.Manandhar  at  [facility], which  is located in the [facility] at  [address] in [place].  [address], which is located to the left of the elevators.  Phone: [phone]  Fax: [phone]  [email or web address]  The patient should arrive 30 minutes prior to their appoitment.   I spent 52 minutes with the patient including than 50% of the time in face to face counseling of the patient and daughter re his septic bursitis antibiotic choice, personally reviewing along with review of medical records in preparation for the visit and during the visit and in coordination of his care.  I will sign off for now.  Please call with any further questions.   LOS: 12 days   Alcide Evener 01/09/2022, 11:08 AM

## 2022-01-09 NOTE — Telephone Encounter (Signed)
Patient Advocate Encounter   Received notification that prior authorization for Linezolid 600MG  tablets is required.   PA submitted on 01/09/2022 Key [id number] Status is pending       01/11/2022, CPhT Pharmacy Patient Advocate Specialist [facility] Patient Advocate Team Direct Number: [phone]  Fax: [phone]

## 2022-01-09 NOTE — Progress Notes (Signed)
[facility] notified via chat that patient has been refusing cardiac monitoring

## 2022-01-09 NOTE — Plan of Care (Signed)
  Problem: Education: Goal: Knowledge of condition and prescribed therapy will improve Outcome: Progressing   Problem: Cardiac: Goal: Will achieve and/or maintain adequate cardiac output Outcome: Progressing   Problem: Physical Regulation: Goal: Complications related to the disease process, condition or treatment will be avoided or minimized Outcome: Progressing   Problem: Safety: Goal: Non-violent Restraint(s) Outcome: Progressing   Problem: Education: Goal: Knowledge of General Education information will improve Description: Including pain rating scale, medication(s)/side effects and non-pharmacologic comfort measures Outcome: Progressing   Problem: Health Behavior/Discharge Planning: Goal: Ability to manage health-related needs will improve Outcome: Progressing   Problem: Clinical Measurements: Goal: Ability to maintain clinical measurements within normal limits will improve Outcome: Progressing Goal: Will remain free from infection Outcome: Progressing Goal: Diagnostic test results will improve Outcome: Progressing Goal: Respiratory complications will improve Outcome: Progressing Goal: Cardiovascular complication will be avoided Outcome: Progressing   Problem: Activity: Goal: Risk for activity intolerance will decrease Outcome: Progressing   Problem: Nutrition: Goal: Adequate nutrition will be maintained Outcome: Progressing   Problem: Coping: Goal: Level of anxiety will decrease Outcome: Progressing   Problem: Elimination: Goal: Will not experience complications related to bowel motility Outcome: Progressing Goal: Will not experience complications related to urinary retention Outcome: Progressing   Problem: Pain Managment: Goal: General experience of comfort will improve Outcome: Progressing   Problem: Safety: Goal: Ability to remain free from injury will improve Outcome: Progressing   Problem: Skin Integrity: Goal: Risk for impaired skin integrity  will decrease Outcome: Progressing   Problem: Safety: Goal: Non-violent Restraint(s) Outcome: Progressing

## 2022-01-09 NOTE — Progress Notes (Signed)
PROGRESS NOTE    Jacob Becker  WUJ:[id number] DOB: 1929-11-09 DOA: 12/28/2021 PCP: Emilio Aspen, MD   Brief Narrative:  88 year old male with medical history of advanced dementia, chronic atrial fibrillation on Xarelto, hypertension, CAD, prostate cancer in remission on chronic hormone manipulation, hepatic and pancreatic cyst presented with near syncope and fall. Patient sustained rib fractures with right hemothorax: chest tube was placed.  He was also found to have right anterior acetabular rim fracture, orthopedics was consulted and it was treated conservatively.   Hemothorax was drained by chest tube, however patient developed small pneumothorax.  Multiple attempts were made to wean off chest tube however patient continued to have small pneumothorax. Trauma surgery was consulted and has been managing chest tube.  Due to recurrence of pneumothorax, cardiothoracic surgery has been consulted.  Assessment & Plan:   Right-sided traumatic hemothorax/recurrent pneumothorax -Trauma surgery and CT surgery following.  Currently has a mini express.  Follow recommendations.  Acute blood loss anemia -Secondary to rib fractures and hemothorax.  Required 1 unit packed red cells transfusion during this hospitalization -Hemoglobin 8.5 on 01/06/2022.  Trauma surgery recommends to hold anticoagulation indefinitely  Nondisplaced right anterior acetabular rim fracture -Conservative management as per orthopedics recommendations: Weightbearing as tolerated.  No surgery needed  Possible left olecranon bursitis -Aspirate culture grew MRSA: ID following and recommended 2 weeks of oral linezolid.  Outpatient follow-up with ID  UTI -Urine culture grew E. coli.  Completed adequate antibiotic treatment  AKI -CT of abdomen/pelvis did not show hydronephrosis.  Creatinine was 1.28 on 01/04/2022.  No recent labs  Hypertension -Blood pressure stable.  Continue amlodipine and metoprolol  Lactic  acidosis: Resolved  Acute metabolic encephalopathy Delirium History of dementia -Monitor mental status.  Patient has advanced dementia.  Continue delirium precautions.  DC bedside sitter  Goals of care -Spoke to daughter at bedside who confirms full CODE STATUS of her father.  Consult palliative care for goals of care discussion  Chronic A-fib -Currently slightly bradycardic.  Continue metoprolol.  Outpatient follow-up with cardiology.  Xarelto to remain on hold indefinitely  Chronic urinary retention -Currently has indwelling Foley catheter placed by urology.  Will need outpatient follow-up with urology.  Prostate cancer -Intermittent.  Continue home hormone replacement therapy  Liver cyst, pancreatic cyst -Stable on CT scan.  Moderate protein calorie malnutrition with unintentional weight loss -Follow nutrition recommendations   DVT prophylaxis: SCDs Code Status: Full Family Communication: Daughter at bedside Disposition Plan: Status is: Inpatient Remains inpatient appropriate because: Of severity of illness.  Need for SNF placement.    Consultants: Trauma surgery/ID/cardiothoracic surgery/urology/orthopedics  Procedures: As above  Antimicrobials:  Anti-infectives (From admission, onward)    Start     Dose/Rate Route Frequency Ordered Stop   01/08/22 2200  linezolid (ZYVOX) tablet 600 mg        600 mg Oral Every 12 hours 01/08/22 1604     01/07/22 1400  sulfamethoxazole-trimethoprim (BACTRIM DS) 800-160 MG per tablet 1 tablet  Status:  Discontinued        1 tablet Oral Every 12 hours 01/07/22 1309 01/08/22 1604   01/03/22 1215  amoxicillin-clavulanate (AUGMENTIN) 875-125 MG per tablet 1 tablet  Status:  Discontinued        1 tablet Oral Every 12 hours 01/03/22 1125 01/07/22 1309   01/01/22 1345  cefdinir (OMNICEF) capsule 300 mg  Status:  Discontinued        300 mg Oral Every 12 hours 01/01/22 1259 01/03/22 1125   12/28/21  1215  cefTRIAXone (ROCEPHIN) 1 g in  sodium chloride 0.9 % 100 mL IVPB  Status:  Discontinued        1 g 200 mL/hr over 30 Minutes Intravenous  Once 12/28/21 1205 12/28/21 1320   12/28/21 1215  azithromycin (ZITHROMAX) 500 mg in sodium chloride 0.9 % 250 mL IVPB  Status:  Discontinued        500 mg 250 mL/hr over 60 Minutes Intravenous  Once 12/28/21 1205 12/28/21 1320        Subjective: Patient seen and examined at bedside.  Awake, confused, poor historian.  Sitter at bedside.  No fever, vomiting, seizures reported.  Objective: Vitals:   01/08/22 1618 01/08/22 1940 01/09/22 0415 01/09/22 1023  BP: 116/60 (!) 122/56 (!) 145/76 121/62  Pulse: (!) 58 (!) 58 71 (!) 54  Resp: 17 18 16 18   Temp: 97.7 F (36.5 C) 98 F (36.7 C) 98 F (36.7 C) 98.2 F (36.8 C)  TempSrc: Oral Oral Oral Oral  SpO2: 97% 98% 96% 98%  Weight:   70.9 kg   Height:        Intake/Output Summary (Last 24 hours) at 01/09/2022 1324 Last data filed at 01/09/2022 0749 Gross per 24 hour  Intake 800 ml  Output 825 ml  Net -25 ml   Filed Weights   01/06/22 0500 01/08/22 0208 01/09/22 0415  Weight: 67.5 kg 67.1 kg 70.9 kg    Examination:  General exam: Appears calm and comfortable.  Looks chronically ill and deconditioned.  Elderly male lying in bed.  On room air. Respiratory system: Bilateral decreased breath sounds at bases with some scattered crackles.  Right-sided chest tube/mini express with bloody drainage present Cardiovascular system: S1 & S2 heard, mild intermittent bradycardia present gastrointestinal system: Abdomen is nondistended, soft and nontender. Normal bowel sounds heard. Extremities: No cyanosis, clubbing; trace lower extremity edema Central nervous system: Awake, confused.  Poor historian.  No focal neurological deficits. Moving extremities Skin: No rashes, lesions or ulcers Psychiatry: Flat affect.  Not agitated. Genitourinary: Indwelling catheter present   Data Reviewed: I have personally reviewed following labs and  imaging studies  CBC: Recent Labs  Lab 01/04/22 0320 01/06/22 0424  WBC 9.6 9.4  HGB 8.7* 8.5*  HCT 25.5* 25.7*  MCV 95.1 96.3  PLT 210 233   Basic Metabolic Panel: Recent Labs  Lab 01/04/22 0320  NA 136  K 4.2  CL 106  CO2 22  GLUCOSE 112*  BUN 34*  CREATININE 1.28*  CALCIUM 8.3*   GFR: Estimated Creatinine Clearance: 38.5 mL/min (A) (by C-G formula based on SCr of 1.28 mg/dL (H)). Liver Function Tests: No results for input(s): "AST", "ALT", "ALKPHOS", "BILITOT", "PROT", "ALBUMIN" in the last 168 hours. No results for input(s): "LIPASE", "AMYLASE" in the last 168 hours. No results for input(s): "AMMONIA" in the last 168 hours. Coagulation Profile: No results for input(s): "INR", "PROTIME" in the last 168 hours. Cardiac Enzymes: No results for input(s): "CKTOTAL", "CKMB", "CKMBINDEX", "TROPONINI" in the last 168 hours. BNP (last 3 results) No results for input(s): "PROBNP" in the last 8760 hours. HbA1C: No results for input(s): "HGBA1C" in the last 72 hours. CBG: Recent Labs  Lab 01/06/22 0533 01/08/22 0453 01/09/22 0421  GLUCAP 111* 103* 136*   Lipid Profile: No results for input(s): "CHOL", "HDL", "LDLCALC", "TRIG", "CHOLHDL", "LDLDIRECT" in the last 72 hours. Thyroid Function Tests: No results for input(s): "TSH", "T4TOTAL", "FREET4", "T3FREE", "THYROIDAB" in the last 72 hours. Anemia Panel: No results for input(s): "  VITAMINB12", "FOLATE", "FERRITIN", "TIBC", "IRON", "RETICCTPCT" in the last 72 hours. Sepsis Labs: No results for input(s): "PROCALCITON", "LATICACIDVEN" in the last 168 hours.  Recent Results (from the past 240 hour(s))  Body fluid culture w Gram Stain     Status: None   Collection Time: 01/04/22  4:02 PM   Specimen: Synovium; Body Fluid  Result Value Ref Range Status   Specimen Description SYNOVIAL  Final   Special Requests BURSA  Final   Gram Stain   Final    FEW WBC PRESENT, PREDOMINANTLY PMN RARE GRAM POSITIVE COCCI Performed at  [facility], [address] [place], [place] [address]    Culture RARE METHICILLIN RESISTANT STAPHYLOCOCCUS AUREUS  Final   Report Status 01/07/2022 FINAL  Final   Organism ID, Bacteria METHICILLIN RESISTANT STAPHYLOCOCCUS AUREUS  Final      Susceptibility   Methicillin resistant staphylococcus aureus - MIC*    CIPROFLOXACIN >=8 RESISTANT Resistant     ERYTHROMYCIN >=8 RESISTANT Resistant     GENTAMICIN <=0.5 SENSITIVE Sensitive     OXACILLIN >=4 RESISTANT Resistant     TETRACYCLINE >=16 RESISTANT Resistant     VANCOMYCIN 1 SENSITIVE Sensitive     TRIMETH/SULFA <=10 SENSITIVE Sensitive     CLINDAMYCIN <=0.25 SENSITIVE Sensitive     RIFAMPIN <=0.5 SENSITIVE Sensitive     Inducible Clindamycin NEGATIVE Sensitive     * RARE METHICILLIN RESISTANT STAPHYLOCOCCUS AUREUS         Radiology Studies: DG CHEST PORT 1 VIEW  Result Date: 01/09/2022 CLINICAL DATA:  Right-sided chest tube.  History of pneumothorax EXAM: PORTABLE CHEST 1 VIEW COMPARISON:  Yesterday FINDINGS: Right-sided chest tube has been retracted. Midline trachea. Mild cardiomegaly. The left costophrenic angle is excluded. No right or large left pleural effusion. 5% right apical pneumothorax felt to be similar. No congestive failure. Slight increase in right perihilar bandlike opacity. Moderate right hemidiaphragm elevation. Subtle right upper lobe airspace disease is similar. Hyperinflation suggests COPD. IMPRESSION: Similar 5% right apical pneumothorax with right chest tube in place. Slight increased right perihilar bandlike opacity which could represent airspace disease and/or fluid in the fissure. Other areas of right-sided airspace disease are mild and similar. Cardiomegaly and hyperinflation. Electronically Signed   By: Jeronimo Greaves M.D.   On: 01/09/2022 08:15   DG CHEST PORT 1 VIEW  Result Date: 01/08/2022 CLINICAL DATA:  Follow-up pneumothorax. EXAM: PORTABLE CHEST 1 VIEW COMPARISON:  Radiograph earlier today,  additional priors reviewed. FINDINGS: Small pneumothorax is similar or slightly diminished in size from prior exam, currently visualized the level of the posterior third rib at the apex. Lateral component is potentially but not definitively seen. Right chest tube remains in place unchanged in position. Persistent bandlike right perihilar opacity. Small volume persistent right pleural fluid. No other interval change. IMPRESSION: 1. Unchanged or slightly diminished size of right pneumothorax. Right chest tube remains in place. 2. Unchanged bandlike right perihilar opacity and small volume right pleural fluid. Electronically Signed   By: Narda Rutherford M.D.   On: 01/08/2022 18:32   DG CHEST PORT 1 VIEW  Result Date: 01/08/2022 CLINICAL DATA:  Pneumothorax. EXAM: PORTABLE CHEST 1 VIEW COMPARISON:  Radiograph 01/07/2022 FINDINGS: Small volume volume RIGHT pneumothorax appreciated RIGHT lung apex and along the RIGHT lateral hemithorax. Slightly increased from prior. RIGHT chest tube in place. Pleural fluid at the RIGHT lung base similar prior. Stable cardiac silhouette with prominent hilar vascular structures no pulmonary edema. No infiltrate. Sclerotic bones consistent with known osseous  metastasis IMPRESSION: 1. small volume RIGHT pneumothorax with chest tube in place. Slight increase in volume. 2. RIGHT pleural fluid unchanged Electronically Signed   By: Genevive Bi M.D.   On: 01/08/2022 08:26        Scheduled Meds:  acetaminophen  1,000 mg Oral TID   amLODipine  5 mg Oral Daily   Chlorhexidine Gluconate Cloth  6 each Topical Daily   colesevelam  1,250 mg Oral BID   docusate sodium  100 mg Oral BID   enzalutamide  160 mg Oral QHS   feeding supplement  237 mL Oral BID BM   heparin injection (subcutaneous)  5,000 Units Subcutaneous Q8H   leptospermum manuka honey  1 Application Topical Daily   lidocaine  1 patch Transdermal Q24H   linezolid  600 mg Oral Q12H   metoprolol succinate  50 mg  Oral Daily   rosuvastatin  40 mg Oral Daily   sodium chloride flush  3 mL Intravenous Q12H   Continuous Infusions:        Glade Lloyd, MD [facility] 01/09/2022, 1:24 PM

## 2022-01-09 NOTE — Progress Notes (Signed)
Patient refused to wear telemetry box [facility] made aware of this course of action

## 2022-01-10 ENCOUNTER — Inpatient Hospital Stay (HOSPITAL_COMMUNITY): Payer: Medicare Other

## 2022-01-10 DIAGNOSIS — R55 Syncope and collapse: Secondary | ICD-10-CM | POA: Diagnosis not present

## 2022-01-10 DIAGNOSIS — S32434S Nondisplaced fracture of anterior column [iliopubic] of right acetabulum, sequela: Secondary | ICD-10-CM | POA: Diagnosis not present

## 2022-01-10 DIAGNOSIS — M6259 Muscle wasting and atrophy, not elsewhere classified, multiple sites: Secondary | ICD-10-CM | POA: Diagnosis not present

## 2022-01-10 DIAGNOSIS — J9811 Atelectasis: Secondary | ICD-10-CM | POA: Diagnosis not present

## 2022-01-10 DIAGNOSIS — I4891 Unspecified atrial fibrillation: Secondary | ICD-10-CM | POA: Diagnosis not present

## 2022-01-10 DIAGNOSIS — B9620 Unspecified Escherichia coli [E. coli] as the cause of diseases classified elsewhere: Secondary | ICD-10-CM | POA: Diagnosis not present

## 2022-01-10 DIAGNOSIS — J942 Hemothorax: Secondary | ICD-10-CM | POA: Diagnosis not present

## 2022-01-10 DIAGNOSIS — F0390 Unspecified dementia without behavioral disturbance: Secondary | ICD-10-CM | POA: Diagnosis not present

## 2022-01-10 DIAGNOSIS — R41841 Cognitive communication deficit: Secondary | ICD-10-CM | POA: Diagnosis not present

## 2022-01-10 DIAGNOSIS — I10 Essential (primary) hypertension: Secondary | ICD-10-CM | POA: Diagnosis not present

## 2022-01-10 DIAGNOSIS — R338 Other retention of urine: Secondary | ICD-10-CM | POA: Diagnosis not present

## 2022-01-10 DIAGNOSIS — E785 Hyperlipidemia, unspecified: Secondary | ICD-10-CM | POA: Diagnosis not present

## 2022-01-10 DIAGNOSIS — N390 Urinary tract infection, site not specified: Secondary | ICD-10-CM | POA: Diagnosis not present

## 2022-01-10 DIAGNOSIS — S32434D Nondisplaced fracture of anterior column [iliopubic] of right acetabulum, subsequent encounter for fracture with routine healing: Secondary | ICD-10-CM | POA: Diagnosis not present

## 2022-01-10 DIAGNOSIS — Z741 Need for assistance with personal care: Secondary | ICD-10-CM | POA: Diagnosis not present

## 2022-01-10 DIAGNOSIS — E440 Moderate protein-calorie malnutrition: Secondary | ICD-10-CM | POA: Diagnosis not present

## 2022-01-10 DIAGNOSIS — T07XXXA Unspecified multiple injuries, initial encounter: Secondary | ICD-10-CM | POA: Diagnosis not present

## 2022-01-10 DIAGNOSIS — N319 Neuromuscular dysfunction of bladder, unspecified: Secondary | ICD-10-CM | POA: Diagnosis not present

## 2022-01-10 DIAGNOSIS — M7022 Olecranon bursitis, left elbow: Secondary | ICD-10-CM | POA: Diagnosis not present

## 2022-01-10 DIAGNOSIS — I25119 Atherosclerotic heart disease of native coronary artery with unspecified angina pectoris: Secondary | ICD-10-CM | POA: Diagnosis not present

## 2022-01-10 DIAGNOSIS — S271XXA Traumatic hemothorax, initial encounter: Secondary | ICD-10-CM | POA: Diagnosis not present

## 2022-01-10 DIAGNOSIS — Z5181 Encounter for therapeutic drug level monitoring: Secondary | ICD-10-CM | POA: Diagnosis not present

## 2022-01-10 DIAGNOSIS — L309 Dermatitis, unspecified: Secondary | ICD-10-CM | POA: Diagnosis not present

## 2022-01-10 DIAGNOSIS — I4811 Longstanding persistent atrial fibrillation: Secondary | ICD-10-CM | POA: Diagnosis not present

## 2022-01-10 DIAGNOSIS — J939 Pneumothorax, unspecified: Secondary | ICD-10-CM | POA: Diagnosis not present

## 2022-01-10 DIAGNOSIS — Z9181 History of falling: Secondary | ICD-10-CM | POA: Diagnosis not present

## 2022-01-10 DIAGNOSIS — M6281 Muscle weakness (generalized): Secondary | ICD-10-CM | POA: Diagnosis not present

## 2022-01-10 DIAGNOSIS — S2241XA Multiple fractures of ribs, right side, initial encounter for closed fracture: Secondary | ICD-10-CM | POA: Diagnosis not present

## 2022-01-10 DIAGNOSIS — N179 Acute kidney failure, unspecified: Secondary | ICD-10-CM | POA: Diagnosis not present

## 2022-01-10 DIAGNOSIS — R2689 Other abnormalities of gait and mobility: Secondary | ICD-10-CM | POA: Diagnosis not present

## 2022-01-10 LAB — CBC WITH DIFFERENTIAL/PLATELET
Abs Immature Granulocytes: 0.05 10*3/uL (ref 0.00–0.07)
Basophils Absolute: 0 10*3/uL (ref 0.0–0.1)
Basophils Relative: 0 %
Eosinophils Absolute: 0 10*3/uL (ref 0.0–0.5)
Eosinophils Relative: 0 %
HCT: 26.3 % — ABNORMAL LOW (ref 39.0–52.0)
Hemoglobin: 8.4 g/dL — ABNORMAL LOW (ref 13.0–17.0)
Immature Granulocytes: 1 %
Lymphocytes Relative: 7 %
Lymphs Abs: 0.7 10*3/uL (ref 0.7–4.0)
MCH: 30.8 pg (ref 26.0–34.0)
MCHC: 31.9 g/dL (ref 30.0–36.0)
MCV: 96.3 fL (ref 80.0–100.0)
Monocytes Absolute: 0.7 10*3/uL (ref 0.1–1.0)
Monocytes Relative: 7 %
Neutro Abs: 8.7 10*3/uL — ABNORMAL HIGH (ref 1.7–7.7)
Neutrophils Relative %: 85 %
Platelets: 275 10*3/uL (ref 150–400)
RBC: 2.73 MIL/uL — ABNORMAL LOW (ref 4.22–5.81)
RDW: 14.6 % (ref 11.5–15.5)
WBC: 10.3 10*3/uL (ref 4.0–10.5)
nRBC: 0 % (ref 0.0–0.2)

## 2022-01-10 LAB — BASIC METABOLIC PANEL
Anion gap: 7 (ref 5–15)
BUN: 29 mg/dL — ABNORMAL HIGH (ref 8–23)
CO2: 20 mmol/L — ABNORMAL LOW (ref 22–32)
Calcium: 8.3 mg/dL — ABNORMAL LOW (ref 8.9–10.3)
Chloride: 109 mmol/L (ref 98–111)
Creatinine, Ser: 1.29 mg/dL — ABNORMAL HIGH (ref 0.61–1.24)
GFR, Estimated: 53 mL/min — ABNORMAL LOW (ref >60)
Glucose, Bld: 138 mg/dL — ABNORMAL HIGH (ref 70–99)
Potassium: 4.5 mmol/L (ref 3.5–5.1)
Sodium: 136 mmol/L (ref 135–145)

## 2022-01-10 LAB — GLUCOSE, CAPILLARY: Glucose-Capillary: 125 mg/dL — ABNORMAL HIGH (ref 70–99)

## 2022-01-10 LAB — MAGNESIUM: Magnesium: 1.9 mg/dL (ref 1.7–2.4)

## 2022-01-10 MED ORDER — METOPROLOL SUCCINATE ER 100 MG PO TB24: 50.0000 mg | ORAL_TABLET | Freq: Every day | ORAL | Status: DC

## 2022-01-10 MED ORDER — DOCUSATE SODIUM 100 MG PO CAPS: 100.0000 mg | ORAL_CAPSULE | Freq: Two times a day (BID) | ORAL | 0 refills | Status: DC

## 2022-01-10 MED ORDER — AMLODIPINE BESYLATE 10 MG PO TABS: 5.0000 mg | ORAL_TABLET | Freq: Every day | ORAL | Status: DC

## 2022-01-10 MED ORDER — METHOCARBAMOL 500 MG PO TABS: 500.0000 mg | ORAL_TABLET | Freq: Four times a day (QID) | ORAL | 0 refills | Status: DC | PRN

## 2022-01-10 MED ORDER — TRAMADOL HCL 50 MG PO TABS: 50.0000 mg | ORAL_TABLET | Freq: Four times a day (QID) | ORAL | 0 refills | Status: DC | PRN

## 2022-01-10 MED ORDER — ACETAMINOPHEN 500 MG PO TABS: 1000.0000 mg | ORAL_TABLET | Freq: Three times a day (TID) | ORAL | Status: DC

## 2022-01-10 MED ORDER — LINEZOLID 600 MG PO TABS: 600.0000 mg | ORAL_TABLET | Freq: Two times a day (BID) | ORAL | 0 refills | Status: AC

## 2022-01-10 MED ORDER — LIDOCAINE 5 % EX PTCH: 1.0000 | MEDICATED_PATCH | CUTANEOUS | 0 refills | Status: DC

## 2022-01-10 MED ORDER — POLYETHYLENE GLYCOL 3350 17 G PO PACK: 17.0000 g | PACK | Freq: Every day | ORAL | 0 refills | Status: DC | PRN

## 2022-01-10 NOTE — Discharge Summary (Signed)
could represent airspace disease and/or fluid in the fissure. Other areas of right-sided airspace disease are mild and similar. Cardiomegaly and hyperinflation. Electronically Signed   By: Jeronimo Greaves M.D.   On: 01/09/2022 08:15   DG CHEST PORT 1 VIEW  Result Date: 01/08/2022 CLINICAL DATA:  Follow-up pneumothorax. EXAM: PORTABLE CHEST 1 VIEW COMPARISON:  Radiograph earlier today, additional priors reviewed. FINDINGS: Small pneumothorax is similar or slightly diminished in size from prior exam, currently visualized the level of the posterior third rib at the apex. Lateral component is potentially but not definitively seen. Right chest tube remains in place unchanged in position. Persistent bandlike right perihilar opacity. Small volume persistent right pleural fluid. No other interval change. IMPRESSION: 1. Unchanged or slightly diminished size of right pneumothorax. Right chest tube  remains in place. 2. Unchanged bandlike right perihilar opacity and small volume right pleural fluid. Electronically Signed   By: Narda Rutherford M.D.   On: 01/08/2022 18:32   DG CHEST PORT 1 VIEW  Result Date: 01/08/2022 CLINICAL DATA:  Pneumothorax. EXAM: PORTABLE CHEST 1 VIEW COMPARISON:  Radiograph 01/07/2022 FINDINGS: Small volume volume RIGHT pneumothorax appreciated RIGHT lung apex and along the RIGHT lateral hemithorax. Slightly increased from prior. RIGHT chest tube in place. Pleural fluid at the RIGHT lung base similar prior. Stable cardiac silhouette with prominent hilar vascular structures no pulmonary edema. No infiltrate. Sclerotic bones consistent with known osseous metastasis IMPRESSION: 1. small volume RIGHT pneumothorax with chest tube in place. Slight increase in volume. 2. RIGHT pleural fluid unchanged Electronically Signed   By: Genevive Bi M.D.   On: 01/08/2022 08:26   DG Chest Port 1 View  Result Date: 01/07/2022 CLINICAL DATA:  Right sided chest tube, pneumothorax. EXAM: PORTABLE CHEST 1 VIEW COMPARISON:  January 06, 2022. FINDINGS: Stable cardiomediastinal silhouette. Right-sided chest tube is unchanged in position. Minimal right apical pneumothorax is noted. Mild left basilar atelectasis is noted. Stable right basilar atelectasis is noted as well. Bony thorax is unremarkable. IMPRESSION: Stable position of right-sided chest tube with minimal right apical pneumothorax. Electronically Signed   By: Lupita Raider M.D.   On: 01/07/2022 08:01   DG Chest Port 1 View  Result Date: 01/06/2022 CLINICAL DATA:  Pneumothorax follow-up EXAM: PORTABLE CHEST 1 VIEW COMPARISON:  01/05/2022 FINDINGS: Unchanged right chest tube positioning. Small right apical pneumothorax. Hazy density at the right base with volume loss attributed to atelectasis and pleural fluid. Stable heart size and mediastinal contours. IMPRESSION: 1. Small right apical pneumothorax. Unchanged chest tube  positioning. 2. Increased atelectasis at the right base. Electronically Signed   By: Tiburcio Pea M.D.   On: 01/06/2022 07:20   DG CHEST PORT 1 VIEW  Result Date: 01/05/2022 CLINICAL DATA:  Chest tube in place EXAM: PORTABLE CHEST 1 VIEW COMPARISON:  01/04/2022. FINDINGS: Right-sided chest pain laterally. No pneumothorax identified. Alveolar density right base could represent some volume loss, layering effusion or consolidation. Normal pulmonary vasculature. Calcified aorta. IMPRESSION: Right-sided chest tube in place. No pneumothorax identified. Right basilar alveolar density consistent with volume loss, consolidation or layering effusion. Electronically Signed   By: Layla Maw M.D.   On: 01/05/2022 09:37   VAS [place] UPPER EXTREMITY VENOUS DUPLEX  Result Date: 01/04/2022 UPPER VENOUS STUDY  Patient Name:  Jacob Becker  Date of Exam:   01/04/2022 Medical Rec #: [id number]         Accession #:    [id number] Date of Birth: 17-Sep-1929  could represent airspace disease and/or fluid in the fissure. Other areas of right-sided airspace disease are mild and similar. Cardiomegaly and hyperinflation. Electronically Signed   By: Jeronimo Greaves M.D.   On: 01/09/2022 08:15   DG CHEST PORT 1 VIEW  Result Date: 01/08/2022 CLINICAL DATA:  Follow-up pneumothorax. EXAM: PORTABLE CHEST 1 VIEW COMPARISON:  Radiograph earlier today, additional priors reviewed. FINDINGS: Small pneumothorax is similar or slightly diminished in size from prior exam, currently visualized the level of the posterior third rib at the apex. Lateral component is potentially but not definitively seen. Right chest tube remains in place unchanged in position. Persistent bandlike right perihilar opacity. Small volume persistent right pleural fluid. No other interval change. IMPRESSION: 1. Unchanged or slightly diminished size of right pneumothorax. Right chest tube  remains in place. 2. Unchanged bandlike right perihilar opacity and small volume right pleural fluid. Electronically Signed   By: Narda Rutherford M.D.   On: 01/08/2022 18:32   DG CHEST PORT 1 VIEW  Result Date: 01/08/2022 CLINICAL DATA:  Pneumothorax. EXAM: PORTABLE CHEST 1 VIEW COMPARISON:  Radiograph 01/07/2022 FINDINGS: Small volume volume RIGHT pneumothorax appreciated RIGHT lung apex and along the RIGHT lateral hemithorax. Slightly increased from prior. RIGHT chest tube in place. Pleural fluid at the RIGHT lung base similar prior. Stable cardiac silhouette with prominent hilar vascular structures no pulmonary edema. No infiltrate. Sclerotic bones consistent with known osseous metastasis IMPRESSION: 1. small volume RIGHT pneumothorax with chest tube in place. Slight increase in volume. 2. RIGHT pleural fluid unchanged Electronically Signed   By: Genevive Bi M.D.   On: 01/08/2022 08:26   DG Chest Port 1 View  Result Date: 01/07/2022 CLINICAL DATA:  Right sided chest tube, pneumothorax. EXAM: PORTABLE CHEST 1 VIEW COMPARISON:  January 06, 2022. FINDINGS: Stable cardiomediastinal silhouette. Right-sided chest tube is unchanged in position. Minimal right apical pneumothorax is noted. Mild left basilar atelectasis is noted. Stable right basilar atelectasis is noted as well. Bony thorax is unremarkable. IMPRESSION: Stable position of right-sided chest tube with minimal right apical pneumothorax. Electronically Signed   By: Lupita Raider M.D.   On: 01/07/2022 08:01   DG Chest Port 1 View  Result Date: 01/06/2022 CLINICAL DATA:  Pneumothorax follow-up EXAM: PORTABLE CHEST 1 VIEW COMPARISON:  01/05/2022 FINDINGS: Unchanged right chest tube positioning. Small right apical pneumothorax. Hazy density at the right base with volume loss attributed to atelectasis and pleural fluid. Stable heart size and mediastinal contours. IMPRESSION: 1. Small right apical pneumothorax. Unchanged chest tube  positioning. 2. Increased atelectasis at the right base. Electronically Signed   By: Tiburcio Pea M.D.   On: 01/06/2022 07:20   DG CHEST PORT 1 VIEW  Result Date: 01/05/2022 CLINICAL DATA:  Chest tube in place EXAM: PORTABLE CHEST 1 VIEW COMPARISON:  01/04/2022. FINDINGS: Right-sided chest pain laterally. No pneumothorax identified. Alveolar density right base could represent some volume loss, layering effusion or consolidation. Normal pulmonary vasculature. Calcified aorta. IMPRESSION: Right-sided chest tube in place. No pneumothorax identified. Right basilar alveolar density consistent with volume loss, consolidation or layering effusion. Electronically Signed   By: Layla Maw M.D.   On: 01/05/2022 09:37   VAS [place] UPPER EXTREMITY VENOUS DUPLEX  Result Date: 01/04/2022 UPPER VENOUS STUDY  Patient Name:  Jacob Becker  Date of Exam:   01/04/2022 Medical Rec #: [id number]         Accession #:    [id number] Date of Birth: 17-Sep-1929  could represent airspace disease and/or fluid in the fissure. Other areas of right-sided airspace disease are mild and similar. Cardiomegaly and hyperinflation. Electronically Signed   By: Jeronimo Greaves M.D.   On: 01/09/2022 08:15   DG CHEST PORT 1 VIEW  Result Date: 01/08/2022 CLINICAL DATA:  Follow-up pneumothorax. EXAM: PORTABLE CHEST 1 VIEW COMPARISON:  Radiograph earlier today, additional priors reviewed. FINDINGS: Small pneumothorax is similar or slightly diminished in size from prior exam, currently visualized the level of the posterior third rib at the apex. Lateral component is potentially but not definitively seen. Right chest tube remains in place unchanged in position. Persistent bandlike right perihilar opacity. Small volume persistent right pleural fluid. No other interval change. IMPRESSION: 1. Unchanged or slightly diminished size of right pneumothorax. Right chest tube  remains in place. 2. Unchanged bandlike right perihilar opacity and small volume right pleural fluid. Electronically Signed   By: Narda Rutherford M.D.   On: 01/08/2022 18:32   DG CHEST PORT 1 VIEW  Result Date: 01/08/2022 CLINICAL DATA:  Pneumothorax. EXAM: PORTABLE CHEST 1 VIEW COMPARISON:  Radiograph 01/07/2022 FINDINGS: Small volume volume RIGHT pneumothorax appreciated RIGHT lung apex and along the RIGHT lateral hemithorax. Slightly increased from prior. RIGHT chest tube in place. Pleural fluid at the RIGHT lung base similar prior. Stable cardiac silhouette with prominent hilar vascular structures no pulmonary edema. No infiltrate. Sclerotic bones consistent with known osseous metastasis IMPRESSION: 1. small volume RIGHT pneumothorax with chest tube in place. Slight increase in volume. 2. RIGHT pleural fluid unchanged Electronically Signed   By: Genevive Bi M.D.   On: 01/08/2022 08:26   DG Chest Port 1 View  Result Date: 01/07/2022 CLINICAL DATA:  Right sided chest tube, pneumothorax. EXAM: PORTABLE CHEST 1 VIEW COMPARISON:  January 06, 2022. FINDINGS: Stable cardiomediastinal silhouette. Right-sided chest tube is unchanged in position. Minimal right apical pneumothorax is noted. Mild left basilar atelectasis is noted. Stable right basilar atelectasis is noted as well. Bony thorax is unremarkable. IMPRESSION: Stable position of right-sided chest tube with minimal right apical pneumothorax. Electronically Signed   By: Lupita Raider M.D.   On: 01/07/2022 08:01   DG Chest Port 1 View  Result Date: 01/06/2022 CLINICAL DATA:  Pneumothorax follow-up EXAM: PORTABLE CHEST 1 VIEW COMPARISON:  01/05/2022 FINDINGS: Unchanged right chest tube positioning. Small right apical pneumothorax. Hazy density at the right base with volume loss attributed to atelectasis and pleural fluid. Stable heart size and mediastinal contours. IMPRESSION: 1. Small right apical pneumothorax. Unchanged chest tube  positioning. 2. Increased atelectasis at the right base. Electronically Signed   By: Tiburcio Pea M.D.   On: 01/06/2022 07:20   DG CHEST PORT 1 VIEW  Result Date: 01/05/2022 CLINICAL DATA:  Chest tube in place EXAM: PORTABLE CHEST 1 VIEW COMPARISON:  01/04/2022. FINDINGS: Right-sided chest pain laterally. No pneumothorax identified. Alveolar density right base could represent some volume loss, layering effusion or consolidation. Normal pulmonary vasculature. Calcified aorta. IMPRESSION: Right-sided chest tube in place. No pneumothorax identified. Right basilar alveolar density consistent with volume loss, consolidation or layering effusion. Electronically Signed   By: Layla Maw M.D.   On: 01/05/2022 09:37   VAS [place] UPPER EXTREMITY VENOUS DUPLEX  Result Date: 01/04/2022 UPPER VENOUS STUDY  Patient Name:  Jacob Becker  Date of Exam:   01/04/2022 Medical Rec #: [id number]         Accession #:    [id number] Date of Birth: 17-Sep-1929  could represent airspace disease and/or fluid in the fissure. Other areas of right-sided airspace disease are mild and similar. Cardiomegaly and hyperinflation. Electronically Signed   By: Jeronimo Greaves M.D.   On: 01/09/2022 08:15   DG CHEST PORT 1 VIEW  Result Date: 01/08/2022 CLINICAL DATA:  Follow-up pneumothorax. EXAM: PORTABLE CHEST 1 VIEW COMPARISON:  Radiograph earlier today, additional priors reviewed. FINDINGS: Small pneumothorax is similar or slightly diminished in size from prior exam, currently visualized the level of the posterior third rib at the apex. Lateral component is potentially but not definitively seen. Right chest tube remains in place unchanged in position. Persistent bandlike right perihilar opacity. Small volume persistent right pleural fluid. No other interval change. IMPRESSION: 1. Unchanged or slightly diminished size of right pneumothorax. Right chest tube  remains in place. 2. Unchanged bandlike right perihilar opacity and small volume right pleural fluid. Electronically Signed   By: Narda Rutherford M.D.   On: 01/08/2022 18:32   DG CHEST PORT 1 VIEW  Result Date: 01/08/2022 CLINICAL DATA:  Pneumothorax. EXAM: PORTABLE CHEST 1 VIEW COMPARISON:  Radiograph 01/07/2022 FINDINGS: Small volume volume RIGHT pneumothorax appreciated RIGHT lung apex and along the RIGHT lateral hemithorax. Slightly increased from prior. RIGHT chest tube in place. Pleural fluid at the RIGHT lung base similar prior. Stable cardiac silhouette with prominent hilar vascular structures no pulmonary edema. No infiltrate. Sclerotic bones consistent with known osseous metastasis IMPRESSION: 1. small volume RIGHT pneumothorax with chest tube in place. Slight increase in volume. 2. RIGHT pleural fluid unchanged Electronically Signed   By: Genevive Bi M.D.   On: 01/08/2022 08:26   DG Chest Port 1 View  Result Date: 01/07/2022 CLINICAL DATA:  Right sided chest tube, pneumothorax. EXAM: PORTABLE CHEST 1 VIEW COMPARISON:  January 06, 2022. FINDINGS: Stable cardiomediastinal silhouette. Right-sided chest tube is unchanged in position. Minimal right apical pneumothorax is noted. Mild left basilar atelectasis is noted. Stable right basilar atelectasis is noted as well. Bony thorax is unremarkable. IMPRESSION: Stable position of right-sided chest tube with minimal right apical pneumothorax. Electronically Signed   By: Lupita Raider M.D.   On: 01/07/2022 08:01   DG Chest Port 1 View  Result Date: 01/06/2022 CLINICAL DATA:  Pneumothorax follow-up EXAM: PORTABLE CHEST 1 VIEW COMPARISON:  01/05/2022 FINDINGS: Unchanged right chest tube positioning. Small right apical pneumothorax. Hazy density at the right base with volume loss attributed to atelectasis and pleural fluid. Stable heart size and mediastinal contours. IMPRESSION: 1. Small right apical pneumothorax. Unchanged chest tube  positioning. 2. Increased atelectasis at the right base. Electronically Signed   By: Tiburcio Pea M.D.   On: 01/06/2022 07:20   DG CHEST PORT 1 VIEW  Result Date: 01/05/2022 CLINICAL DATA:  Chest tube in place EXAM: PORTABLE CHEST 1 VIEW COMPARISON:  01/04/2022. FINDINGS: Right-sided chest pain laterally. No pneumothorax identified. Alveolar density right base could represent some volume loss, layering effusion or consolidation. Normal pulmonary vasculature. Calcified aorta. IMPRESSION: Right-sided chest tube in place. No pneumothorax identified. Right basilar alveolar density consistent with volume loss, consolidation or layering effusion. Electronically Signed   By: Layla Maw M.D.   On: 01/05/2022 09:37   VAS [place] UPPER EXTREMITY VENOUS DUPLEX  Result Date: 01/04/2022 UPPER VENOUS STUDY  Patient Name:  Jacob Becker  Date of Exam:   01/04/2022 Medical Rec #: [id number]         Accession #:    [id number] Date of Birth: 17-Sep-1929  Physician Discharge Summary  Jacob Becker [id number] DOB: December 18, 1929 DOA: 12/28/2021  PCP: Kathalene Frames, MD  Admit date: 12/28/2021 Discharge date: 01/10/2022  Admitted From: Home Disposition: SNF  Recommendations for Outpatient Follow-up:  Follow up with SNF provider at earliest convenience Outpatient follow-up with cardiothoracic surgery and infectious diseases Recommend follow-up with palliative care team to continue goals of care discussion Outpatient follow-up with urology Follow up in ED if symptoms worsen or new appear   Home Health: No Equipment/Devices: None  Discharge Condition: Guarded CODE STATUS: Full Diet recommendation: Heart healthy  Brief/Interim Summary: 88 year old male with medical history of advanced dementia, chronic atrial fibrillation on Xarelto, hypertension, CAD, prostate cancer in remission on chronic hormone manipulation, hepatic and pancreatic cyst presented with near syncope and fall. Patient sustained rib fractures with right hemothorax: chest tube was placed.  He was also found to have right anterior acetabular rim fracture, orthopedics was consulted and it was treated conservatively.   Hemothorax was drained by chest tube, however patient developed small pneumothorax.  Multiple attempts were made to wean off chest tube however patient continued to have small pneumothorax. Trauma surgery was consulted and has been managing chest tube.  Due to recurrence of pneumothorax, cardiothoracic surgery has been consulted.  Chest tube was switched to mini express which was subsequently removed.  Cardiothoracic surgery has cleared the patient for discharge today.  He will be discharged to SNF once bed is available.  Discharge Diagnoses:   Right-sided traumatic hemothorax/recurrent pneumothorax -Trauma surgery and CT surgery following.  Chest tube was switched to mini express which was subsequently removed.  Chest x-ray this morning shows stable  small apical pneumothorax.  Cardiothoracic surgery has cleared the patient for discharge today.  Outpatient follow-up with cardiothoracic surgery.  He will be discharged to SNF once bed is available.   Acute blood loss anemia -Secondary to rib fractures and hemothorax.  Required 1 unit packed red cells transfusion during this hospitalization -Hemoglobin 8.4 today.  Outpatient follow-up.  Trauma surgery recommends to hold anticoagulation indefinitely   Nondisplaced right anterior acetabular rim fracture -Conservative management as per orthopedics recommendations: Weightbearing as tolerated.  No surgery needed   Possible left olecranon bursitis -Aspirate culture grew MRSA: ID following and recommended total of 2 weeks of oral linezolid.  Outpatient follow-up with ID   UTI -Urine culture grew E. coli.  Completed adequate antibiotic treatment   AKI -CT of abdomen/pelvis did not show hydronephrosis.  Creatinine 1.29 today.  Stable.  Hypertension -Blood pressure stable.  Continue amlodipine and metoprolol; lower doses.  Ramipril, triamterene and hydrochlorothiazide remain on hold.   Lactic acidosis: Resolved   Acute metabolic encephalopathy Delirium History of dementia -Currently stable.  Has waxing and waning symptoms.  Patient has advanced dementia.  Bedside sitter has been discontinued.  Goals of care -Palliative care evaluation appreciated.  Patient remains full CODE STATUS.  Outpatient follow-up with palliative care.   Chronic A-fib -Currently rate controlled.  Continue lower dose of metoprolol.  Outpatient follow-up with cardiology.  Xarelto to remain on hold indefinitely   Chronic urinary retention -Currently has indwelling Foley catheter placed by urology.  Will need outpatient follow-up with urology.   Prostate cancer -Intermittent.  Continue home hormone replacement therapy   Liver cyst, pancreatic cyst -Stable on CT scan.   Moderate protein calorie malnutrition with  unintentional weight loss -Follow nutrition recommendations  Discharge Instructions  Discharge Instructions     Ambulatory referral to Infectious Disease   Complete by: As directed  and 12/30/2021.  CT 12/28/2021. FINDINGS: 0546 hours. The heart size and mediastinal contours are stable with aortic atherosclerosis. Right chest tube is unchanged in position. Minimal right apical pneumothorax is unchanged. There is improved aeration of the right lung base. No mediastinal shift or significant pleural effusion. The bones appear unchanged.  IMPRESSION: Unchanged minimal right apical pneumothorax. Improved aeration of the right lung base. Electronically Signed   By: Richardean Sale M.D.   On: 01/01/2022 08:49   DG CHEST PORT 1 VIEW  Result Date: 12/31/2021 CLINICAL DATA:  Chest tube EXAM: PORTABLE CHEST 1 VIEW COMPARISON:  Portable exam 0611 hours compared to 12/30/2021 FINDINGS: RIGHT thoracostomy tube stable. Upper normal heart size. Mediastinal contours and pulmonary vascularity normal. Atherosclerotic calcification aorta. RIGHT basilar atelectasis again seen with persistent small RIGHT apex pneumothorax. LEFT lung clear. Bones demineralized. IMPRESSION: Persistent small RIGHT apex pneumothorax despite thoracostomy tube. RIGHT basilar atelectasis. Aortic Atherosclerosis (ICD10-I70.0). Electronically Signed   By: Lavonia Dana M.D.   On: 12/31/2021 08:22   DG CHEST PORT 1 VIEW  Result Date: 12/30/2021 CLINICAL DATA:  Status post fall on blood thinners. Chest tube in place. EXAM: PORTABLE CHEST 1 VIEW COMPARISON:  December 29, 2021 FINDINGS: The heart size and mediastinal contours are within normal limits. Pleural line is identified in the right apex suggesting minimal right apical pneumothorax. Right chest tube is unchanged. Minimal atelectasis of bilateral lung bases are noted. The visualized skeletal structures are stable. IMPRESSION: Pleural line is identified in the right apex suggesting minimal right apical pneumothorax. Right chest tube is unchanged. These results will be called to the ordering clinician or representative by the Radiologist Assistant, and communication documented in the PACS or [employer]. Electronically Signed   By: Abelardo Diesel M.D.   On: 12/30/2021 09:21   ECHOCARDIOGRAM COMPLETE  Result Date: 12/29/2021    ECHOCARDIOGRAM REPORT   Patient Name:   Jacob Becker Date of Exam: 12/29/2021 Medical Rec #:  [id number]        Height:       70.0 in Accession #:    [id number]       Weight:       150.0 lb Date of Birth:   1929-03-14        BSA:          1.847 m Patient Age:    43 years         BP:           126/52 mmHg Patient Gender: M                HR:           71 bpm. Exam Location:  Inpatient Procedure: 2D Echo Indications:    syncope  History:        Patient has no prior history of Echocardiogram examinations.                 Arrythmias:Atrial Fibrillation.  Sonographer:    Johny Chess RDCS Referring Phys: [id number] Lequita Halt  Sonographer Comments: Image acquisition challenging due to respiratory motion. IMPRESSIONS  1. Left ventricular ejection fraction, by estimation, is 60 to 65%. The left ventricle has normal function. The left ventricle has no regional wall motion abnormalities. There is moderate left ventricular hypertrophy of the basal-septal segment. Left ventricular diastolic parameters are indeterminate.  2. Right ventricular systolic function is normal. The right ventricular size is normal. There is normal pulmonary artery systolic pressure.  3. Left atrial size  could represent airspace disease and/or fluid in the fissure. Other areas of right-sided airspace disease are mild and similar. Cardiomegaly and hyperinflation. Electronically Signed   By: Jeronimo Greaves M.D.   On: 01/09/2022 08:15   DG CHEST PORT 1 VIEW  Result Date: 01/08/2022 CLINICAL DATA:  Follow-up pneumothorax. EXAM: PORTABLE CHEST 1 VIEW COMPARISON:  Radiograph earlier today, additional priors reviewed. FINDINGS: Small pneumothorax is similar or slightly diminished in size from prior exam, currently visualized the level of the posterior third rib at the apex. Lateral component is potentially but not definitively seen. Right chest tube remains in place unchanged in position. Persistent bandlike right perihilar opacity. Small volume persistent right pleural fluid. No other interval change. IMPRESSION: 1. Unchanged or slightly diminished size of right pneumothorax. Right chest tube  remains in place. 2. Unchanged bandlike right perihilar opacity and small volume right pleural fluid. Electronically Signed   By: Narda Rutherford M.D.   On: 01/08/2022 18:32   DG CHEST PORT 1 VIEW  Result Date: 01/08/2022 CLINICAL DATA:  Pneumothorax. EXAM: PORTABLE CHEST 1 VIEW COMPARISON:  Radiograph 01/07/2022 FINDINGS: Small volume volume RIGHT pneumothorax appreciated RIGHT lung apex and along the RIGHT lateral hemithorax. Slightly increased from prior. RIGHT chest tube in place. Pleural fluid at the RIGHT lung base similar prior. Stable cardiac silhouette with prominent hilar vascular structures no pulmonary edema. No infiltrate. Sclerotic bones consistent with known osseous metastasis IMPRESSION: 1. small volume RIGHT pneumothorax with chest tube in place. Slight increase in volume. 2. RIGHT pleural fluid unchanged Electronically Signed   By: Genevive Bi M.D.   On: 01/08/2022 08:26   DG Chest Port 1 View  Result Date: 01/07/2022 CLINICAL DATA:  Right sided chest tube, pneumothorax. EXAM: PORTABLE CHEST 1 VIEW COMPARISON:  January 06, 2022. FINDINGS: Stable cardiomediastinal silhouette. Right-sided chest tube is unchanged in position. Minimal right apical pneumothorax is noted. Mild left basilar atelectasis is noted. Stable right basilar atelectasis is noted as well. Bony thorax is unremarkable. IMPRESSION: Stable position of right-sided chest tube with minimal right apical pneumothorax. Electronically Signed   By: Lupita Raider M.D.   On: 01/07/2022 08:01   DG Chest Port 1 View  Result Date: 01/06/2022 CLINICAL DATA:  Pneumothorax follow-up EXAM: PORTABLE CHEST 1 VIEW COMPARISON:  01/05/2022 FINDINGS: Unchanged right chest tube positioning. Small right apical pneumothorax. Hazy density at the right base with volume loss attributed to atelectasis and pleural fluid. Stable heart size and mediastinal contours. IMPRESSION: 1. Small right apical pneumothorax. Unchanged chest tube  positioning. 2. Increased atelectasis at the right base. Electronically Signed   By: Tiburcio Pea M.D.   On: 01/06/2022 07:20   DG CHEST PORT 1 VIEW  Result Date: 01/05/2022 CLINICAL DATA:  Chest tube in place EXAM: PORTABLE CHEST 1 VIEW COMPARISON:  01/04/2022. FINDINGS: Right-sided chest pain laterally. No pneumothorax identified. Alveolar density right base could represent some volume loss, layering effusion or consolidation. Normal pulmonary vasculature. Calcified aorta. IMPRESSION: Right-sided chest tube in place. No pneumothorax identified. Right basilar alveolar density consistent with volume loss, consolidation or layering effusion. Electronically Signed   By: Layla Maw M.D.   On: 01/05/2022 09:37   VAS [place] UPPER EXTREMITY VENOUS DUPLEX  Result Date: 01/04/2022 UPPER VENOUS STUDY  Patient Name:  Jacob Becker  Date of Exam:   01/04/2022 Medical Rec #: [id number]         Accession #:    [id number] Date of Birth: 17-Sep-1929  could represent airspace disease and/or fluid in the fissure. Other areas of right-sided airspace disease are mild and similar. Cardiomegaly and hyperinflation. Electronically Signed   By: Jeronimo Greaves M.D.   On: 01/09/2022 08:15   DG CHEST PORT 1 VIEW  Result Date: 01/08/2022 CLINICAL DATA:  Follow-up pneumothorax. EXAM: PORTABLE CHEST 1 VIEW COMPARISON:  Radiograph earlier today, additional priors reviewed. FINDINGS: Small pneumothorax is similar or slightly diminished in size from prior exam, currently visualized the level of the posterior third rib at the apex. Lateral component is potentially but not definitively seen. Right chest tube remains in place unchanged in position. Persistent bandlike right perihilar opacity. Small volume persistent right pleural fluid. No other interval change. IMPRESSION: 1. Unchanged or slightly diminished size of right pneumothorax. Right chest tube  remains in place. 2. Unchanged bandlike right perihilar opacity and small volume right pleural fluid. Electronically Signed   By: Narda Rutherford M.D.   On: 01/08/2022 18:32   DG CHEST PORT 1 VIEW  Result Date: 01/08/2022 CLINICAL DATA:  Pneumothorax. EXAM: PORTABLE CHEST 1 VIEW COMPARISON:  Radiograph 01/07/2022 FINDINGS: Small volume volume RIGHT pneumothorax appreciated RIGHT lung apex and along the RIGHT lateral hemithorax. Slightly increased from prior. RIGHT chest tube in place. Pleural fluid at the RIGHT lung base similar prior. Stable cardiac silhouette with prominent hilar vascular structures no pulmonary edema. No infiltrate. Sclerotic bones consistent with known osseous metastasis IMPRESSION: 1. small volume RIGHT pneumothorax with chest tube in place. Slight increase in volume. 2. RIGHT pleural fluid unchanged Electronically Signed   By: Genevive Bi M.D.   On: 01/08/2022 08:26   DG Chest Port 1 View  Result Date: 01/07/2022 CLINICAL DATA:  Right sided chest tube, pneumothorax. EXAM: PORTABLE CHEST 1 VIEW COMPARISON:  January 06, 2022. FINDINGS: Stable cardiomediastinal silhouette. Right-sided chest tube is unchanged in position. Minimal right apical pneumothorax is noted. Mild left basilar atelectasis is noted. Stable right basilar atelectasis is noted as well. Bony thorax is unremarkable. IMPRESSION: Stable position of right-sided chest tube with minimal right apical pneumothorax. Electronically Signed   By: Lupita Raider M.D.   On: 01/07/2022 08:01   DG Chest Port 1 View  Result Date: 01/06/2022 CLINICAL DATA:  Pneumothorax follow-up EXAM: PORTABLE CHEST 1 VIEW COMPARISON:  01/05/2022 FINDINGS: Unchanged right chest tube positioning. Small right apical pneumothorax. Hazy density at the right base with volume loss attributed to atelectasis and pleural fluid. Stable heart size and mediastinal contours. IMPRESSION: 1. Small right apical pneumothorax. Unchanged chest tube  positioning. 2. Increased atelectasis at the right base. Electronically Signed   By: Tiburcio Pea M.D.   On: 01/06/2022 07:20   DG CHEST PORT 1 VIEW  Result Date: 01/05/2022 CLINICAL DATA:  Chest tube in place EXAM: PORTABLE CHEST 1 VIEW COMPARISON:  01/04/2022. FINDINGS: Right-sided chest pain laterally. No pneumothorax identified. Alveolar density right base could represent some volume loss, layering effusion or consolidation. Normal pulmonary vasculature. Calcified aorta. IMPRESSION: Right-sided chest tube in place. No pneumothorax identified. Right basilar alveolar density consistent with volume loss, consolidation or layering effusion. Electronically Signed   By: Layla Maw M.D.   On: 01/05/2022 09:37   VAS [place] UPPER EXTREMITY VENOUS DUPLEX  Result Date: 01/04/2022 UPPER VENOUS STUDY  Patient Name:  Jacob Becker  Date of Exam:   01/04/2022 Medical Rec #: [id number]         Accession #:    [id number] Date of Birth: 17-Sep-1929  and 12/30/2021.  CT 12/28/2021. FINDINGS: 0546 hours. The heart size and mediastinal contours are stable with aortic atherosclerosis. Right chest tube is unchanged in position. Minimal right apical pneumothorax is unchanged. There is improved aeration of the right lung base. No mediastinal shift or significant pleural effusion. The bones appear unchanged.  IMPRESSION: Unchanged minimal right apical pneumothorax. Improved aeration of the right lung base. Electronically Signed   By: Richardean Sale M.D.   On: 01/01/2022 08:49   DG CHEST PORT 1 VIEW  Result Date: 12/31/2021 CLINICAL DATA:  Chest tube EXAM: PORTABLE CHEST 1 VIEW COMPARISON:  Portable exam 0611 hours compared to 12/30/2021 FINDINGS: RIGHT thoracostomy tube stable. Upper normal heart size. Mediastinal contours and pulmonary vascularity normal. Atherosclerotic calcification aorta. RIGHT basilar atelectasis again seen with persistent small RIGHT apex pneumothorax. LEFT lung clear. Bones demineralized. IMPRESSION: Persistent small RIGHT apex pneumothorax despite thoracostomy tube. RIGHT basilar atelectasis. Aortic Atherosclerosis (ICD10-I70.0). Electronically Signed   By: Lavonia Dana M.D.   On: 12/31/2021 08:22   DG CHEST PORT 1 VIEW  Result Date: 12/30/2021 CLINICAL DATA:  Status post fall on blood thinners. Chest tube in place. EXAM: PORTABLE CHEST 1 VIEW COMPARISON:  December 29, 2021 FINDINGS: The heart size and mediastinal contours are within normal limits. Pleural line is identified in the right apex suggesting minimal right apical pneumothorax. Right chest tube is unchanged. Minimal atelectasis of bilateral lung bases are noted. The visualized skeletal structures are stable. IMPRESSION: Pleural line is identified in the right apex suggesting minimal right apical pneumothorax. Right chest tube is unchanged. These results will be called to the ordering clinician or representative by the Radiologist Assistant, and communication documented in the PACS or [employer]. Electronically Signed   By: Abelardo Diesel M.D.   On: 12/30/2021 09:21   ECHOCARDIOGRAM COMPLETE  Result Date: 12/29/2021    ECHOCARDIOGRAM REPORT   Patient Name:   Jacob Becker Date of Exam: 12/29/2021 Medical Rec #:  [id number]        Height:       70.0 in Accession #:    [id number]       Weight:       150.0 lb Date of Birth:   1929-03-14        BSA:          1.847 m Patient Age:    43 years         BP:           126/52 mmHg Patient Gender: M                HR:           71 bpm. Exam Location:  Inpatient Procedure: 2D Echo Indications:    syncope  History:        Patient has no prior history of Echocardiogram examinations.                 Arrythmias:Atrial Fibrillation.  Sonographer:    Johny Chess RDCS Referring Phys: [id number] Lequita Halt  Sonographer Comments: Image acquisition challenging due to respiratory motion. IMPRESSIONS  1. Left ventricular ejection fraction, by estimation, is 60 to 65%. The left ventricle has normal function. The left ventricle has no regional wall motion abnormalities. There is moderate left ventricular hypertrophy of the basal-septal segment. Left ventricular diastolic parameters are indeterminate.  2. Right ventricular systolic function is normal. The right ventricular size is normal. There is normal pulmonary artery systolic pressure.  3. Left atrial size  and 12/30/2021.  CT 12/28/2021. FINDINGS: 0546 hours. The heart size and mediastinal contours are stable with aortic atherosclerosis. Right chest tube is unchanged in position. Minimal right apical pneumothorax is unchanged. There is improved aeration of the right lung base. No mediastinal shift or significant pleural effusion. The bones appear unchanged.  IMPRESSION: Unchanged minimal right apical pneumothorax. Improved aeration of the right lung base. Electronically Signed   By: Richardean Sale M.D.   On: 01/01/2022 08:49   DG CHEST PORT 1 VIEW  Result Date: 12/31/2021 CLINICAL DATA:  Chest tube EXAM: PORTABLE CHEST 1 VIEW COMPARISON:  Portable exam 0611 hours compared to 12/30/2021 FINDINGS: RIGHT thoracostomy tube stable. Upper normal heart size. Mediastinal contours and pulmonary vascularity normal. Atherosclerotic calcification aorta. RIGHT basilar atelectasis again seen with persistent small RIGHT apex pneumothorax. LEFT lung clear. Bones demineralized. IMPRESSION: Persistent small RIGHT apex pneumothorax despite thoracostomy tube. RIGHT basilar atelectasis. Aortic Atherosclerosis (ICD10-I70.0). Electronically Signed   By: Lavonia Dana M.D.   On: 12/31/2021 08:22   DG CHEST PORT 1 VIEW  Result Date: 12/30/2021 CLINICAL DATA:  Status post fall on blood thinners. Chest tube in place. EXAM: PORTABLE CHEST 1 VIEW COMPARISON:  December 29, 2021 FINDINGS: The heart size and mediastinal contours are within normal limits. Pleural line is identified in the right apex suggesting minimal right apical pneumothorax. Right chest tube is unchanged. Minimal atelectasis of bilateral lung bases are noted. The visualized skeletal structures are stable. IMPRESSION: Pleural line is identified in the right apex suggesting minimal right apical pneumothorax. Right chest tube is unchanged. These results will be called to the ordering clinician or representative by the Radiologist Assistant, and communication documented in the PACS or [employer]. Electronically Signed   By: Abelardo Diesel M.D.   On: 12/30/2021 09:21   ECHOCARDIOGRAM COMPLETE  Result Date: 12/29/2021    ECHOCARDIOGRAM REPORT   Patient Name:   Jacob Becker Date of Exam: 12/29/2021 Medical Rec #:  [id number]        Height:       70.0 in Accession #:    [id number]       Weight:       150.0 lb Date of Birth:   1929-03-14        BSA:          1.847 m Patient Age:    43 years         BP:           126/52 mmHg Patient Gender: M                HR:           71 bpm. Exam Location:  Inpatient Procedure: 2D Echo Indications:    syncope  History:        Patient has no prior history of Echocardiogram examinations.                 Arrythmias:Atrial Fibrillation.  Sonographer:    Johny Chess RDCS Referring Phys: [id number] Lequita Halt  Sonographer Comments: Image acquisition challenging due to respiratory motion. IMPRESSIONS  1. Left ventricular ejection fraction, by estimation, is 60 to 65%. The left ventricle has normal function. The left ventricle has no regional wall motion abnormalities. There is moderate left ventricular hypertrophy of the basal-septal segment. Left ventricular diastolic parameters are indeterminate.  2. Right ventricular systolic function is normal. The right ventricular size is normal. There is normal pulmonary artery systolic pressure.  3. Left atrial size  Physician Discharge Summary  Jacob Becker [id number] DOB: December 18, 1929 DOA: 12/28/2021  PCP: Kathalene Frames, MD  Admit date: 12/28/2021 Discharge date: 01/10/2022  Admitted From: Home Disposition: SNF  Recommendations for Outpatient Follow-up:  Follow up with SNF provider at earliest convenience Outpatient follow-up with cardiothoracic surgery and infectious diseases Recommend follow-up with palliative care team to continue goals of care discussion Outpatient follow-up with urology Follow up in ED if symptoms worsen or new appear   Home Health: No Equipment/Devices: None  Discharge Condition: Guarded CODE STATUS: Full Diet recommendation: Heart healthy  Brief/Interim Summary: 88 year old male with medical history of advanced dementia, chronic atrial fibrillation on Xarelto, hypertension, CAD, prostate cancer in remission on chronic hormone manipulation, hepatic and pancreatic cyst presented with near syncope and fall. Patient sustained rib fractures with right hemothorax: chest tube was placed.  He was also found to have right anterior acetabular rim fracture, orthopedics was consulted and it was treated conservatively.   Hemothorax was drained by chest tube, however patient developed small pneumothorax.  Multiple attempts were made to wean off chest tube however patient continued to have small pneumothorax. Trauma surgery was consulted and has been managing chest tube.  Due to recurrence of pneumothorax, cardiothoracic surgery has been consulted.  Chest tube was switched to mini express which was subsequently removed.  Cardiothoracic surgery has cleared the patient for discharge today.  He will be discharged to SNF once bed is available.  Discharge Diagnoses:   Right-sided traumatic hemothorax/recurrent pneumothorax -Trauma surgery and CT surgery following.  Chest tube was switched to mini express which was subsequently removed.  Chest x-ray this morning shows stable  small apical pneumothorax.  Cardiothoracic surgery has cleared the patient for discharge today.  Outpatient follow-up with cardiothoracic surgery.  He will be discharged to SNF once bed is available.   Acute blood loss anemia -Secondary to rib fractures and hemothorax.  Required 1 unit packed red cells transfusion during this hospitalization -Hemoglobin 8.4 today.  Outpatient follow-up.  Trauma surgery recommends to hold anticoagulation indefinitely   Nondisplaced right anterior acetabular rim fracture -Conservative management as per orthopedics recommendations: Weightbearing as tolerated.  No surgery needed   Possible left olecranon bursitis -Aspirate culture grew MRSA: ID following and recommended total of 2 weeks of oral linezolid.  Outpatient follow-up with ID   UTI -Urine culture grew E. coli.  Completed adequate antibiotic treatment   AKI -CT of abdomen/pelvis did not show hydronephrosis.  Creatinine 1.29 today.  Stable.  Hypertension -Blood pressure stable.  Continue amlodipine and metoprolol; lower doses.  Ramipril, triamterene and hydrochlorothiazide remain on hold.   Lactic acidosis: Resolved   Acute metabolic encephalopathy Delirium History of dementia -Currently stable.  Has waxing and waning symptoms.  Patient has advanced dementia.  Bedside sitter has been discontinued.  Goals of care -Palliative care evaluation appreciated.  Patient remains full CODE STATUS.  Outpatient follow-up with palliative care.   Chronic A-fib -Currently rate controlled.  Continue lower dose of metoprolol.  Outpatient follow-up with cardiology.  Xarelto to remain on hold indefinitely   Chronic urinary retention -Currently has indwelling Foley catheter placed by urology.  Will need outpatient follow-up with urology.   Prostate cancer -Intermittent.  Continue home hormone replacement therapy   Liver cyst, pancreatic cyst -Stable on CT scan.   Moderate protein calorie malnutrition with  unintentional weight loss -Follow nutrition recommendations  Discharge Instructions  Discharge Instructions     Ambulatory referral to Infectious Disease   Complete by: As directed  could represent airspace disease and/or fluid in the fissure. Other areas of right-sided airspace disease are mild and similar. Cardiomegaly and hyperinflation. Electronically Signed   By: Jeronimo Greaves M.D.   On: 01/09/2022 08:15   DG CHEST PORT 1 VIEW  Result Date: 01/08/2022 CLINICAL DATA:  Follow-up pneumothorax. EXAM: PORTABLE CHEST 1 VIEW COMPARISON:  Radiograph earlier today, additional priors reviewed. FINDINGS: Small pneumothorax is similar or slightly diminished in size from prior exam, currently visualized the level of the posterior third rib at the apex. Lateral component is potentially but not definitively seen. Right chest tube remains in place unchanged in position. Persistent bandlike right perihilar opacity. Small volume persistent right pleural fluid. No other interval change. IMPRESSION: 1. Unchanged or slightly diminished size of right pneumothorax. Right chest tube  remains in place. 2. Unchanged bandlike right perihilar opacity and small volume right pleural fluid. Electronically Signed   By: Narda Rutherford M.D.   On: 01/08/2022 18:32   DG CHEST PORT 1 VIEW  Result Date: 01/08/2022 CLINICAL DATA:  Pneumothorax. EXAM: PORTABLE CHEST 1 VIEW COMPARISON:  Radiograph 01/07/2022 FINDINGS: Small volume volume RIGHT pneumothorax appreciated RIGHT lung apex and along the RIGHT lateral hemithorax. Slightly increased from prior. RIGHT chest tube in place. Pleural fluid at the RIGHT lung base similar prior. Stable cardiac silhouette with prominent hilar vascular structures no pulmonary edema. No infiltrate. Sclerotic bones consistent with known osseous metastasis IMPRESSION: 1. small volume RIGHT pneumothorax with chest tube in place. Slight increase in volume. 2. RIGHT pleural fluid unchanged Electronically Signed   By: Genevive Bi M.D.   On: 01/08/2022 08:26   DG Chest Port 1 View  Result Date: 01/07/2022 CLINICAL DATA:  Right sided chest tube, pneumothorax. EXAM: PORTABLE CHEST 1 VIEW COMPARISON:  January 06, 2022. FINDINGS: Stable cardiomediastinal silhouette. Right-sided chest tube is unchanged in position. Minimal right apical pneumothorax is noted. Mild left basilar atelectasis is noted. Stable right basilar atelectasis is noted as well. Bony thorax is unremarkable. IMPRESSION: Stable position of right-sided chest tube with minimal right apical pneumothorax. Electronically Signed   By: Lupita Raider M.D.   On: 01/07/2022 08:01   DG Chest Port 1 View  Result Date: 01/06/2022 CLINICAL DATA:  Pneumothorax follow-up EXAM: PORTABLE CHEST 1 VIEW COMPARISON:  01/05/2022 FINDINGS: Unchanged right chest tube positioning. Small right apical pneumothorax. Hazy density at the right base with volume loss attributed to atelectasis and pleural fluid. Stable heart size and mediastinal contours. IMPRESSION: 1. Small right apical pneumothorax. Unchanged chest tube  positioning. 2. Increased atelectasis at the right base. Electronically Signed   By: Tiburcio Pea M.D.   On: 01/06/2022 07:20   DG CHEST PORT 1 VIEW  Result Date: 01/05/2022 CLINICAL DATA:  Chest tube in place EXAM: PORTABLE CHEST 1 VIEW COMPARISON:  01/04/2022. FINDINGS: Right-sided chest pain laterally. No pneumothorax identified. Alveolar density right base could represent some volume loss, layering effusion or consolidation. Normal pulmonary vasculature. Calcified aorta. IMPRESSION: Right-sided chest tube in place. No pneumothorax identified. Right basilar alveolar density consistent with volume loss, consolidation or layering effusion. Electronically Signed   By: Layla Maw M.D.   On: 01/05/2022 09:37   VAS [place] UPPER EXTREMITY VENOUS DUPLEX  Result Date: 01/04/2022 UPPER VENOUS STUDY  Patient Name:  Jacob Becker  Date of Exam:   01/04/2022 Medical Rec #: [id number]         Accession #:    [id number] Date of Birth: 17-Sep-1929  Physician Discharge Summary  Jacob Becker [id number] DOB: December 18, 1929 DOA: 12/28/2021  PCP: Kathalene Frames, MD  Admit date: 12/28/2021 Discharge date: 01/10/2022  Admitted From: Home Disposition: SNF  Recommendations for Outpatient Follow-up:  Follow up with SNF provider at earliest convenience Outpatient follow-up with cardiothoracic surgery and infectious diseases Recommend follow-up with palliative care team to continue goals of care discussion Outpatient follow-up with urology Follow up in ED if symptoms worsen or new appear   Home Health: No Equipment/Devices: None  Discharge Condition: Guarded CODE STATUS: Full Diet recommendation: Heart healthy  Brief/Interim Summary: 88 year old male with medical history of advanced dementia, chronic atrial fibrillation on Xarelto, hypertension, CAD, prostate cancer in remission on chronic hormone manipulation, hepatic and pancreatic cyst presented with near syncope and fall. Patient sustained rib fractures with right hemothorax: chest tube was placed.  He was also found to have right anterior acetabular rim fracture, orthopedics was consulted and it was treated conservatively.   Hemothorax was drained by chest tube, however patient developed small pneumothorax.  Multiple attempts were made to wean off chest tube however patient continued to have small pneumothorax. Trauma surgery was consulted and has been managing chest tube.  Due to recurrence of pneumothorax, cardiothoracic surgery has been consulted.  Chest tube was switched to mini express which was subsequently removed.  Cardiothoracic surgery has cleared the patient for discharge today.  He will be discharged to SNF once bed is available.  Discharge Diagnoses:   Right-sided traumatic hemothorax/recurrent pneumothorax -Trauma surgery and CT surgery following.  Chest tube was switched to mini express which was subsequently removed.  Chest x-ray this morning shows stable  small apical pneumothorax.  Cardiothoracic surgery has cleared the patient for discharge today.  Outpatient follow-up with cardiothoracic surgery.  He will be discharged to SNF once bed is available.   Acute blood loss anemia -Secondary to rib fractures and hemothorax.  Required 1 unit packed red cells transfusion during this hospitalization -Hemoglobin 8.4 today.  Outpatient follow-up.  Trauma surgery recommends to hold anticoagulation indefinitely   Nondisplaced right anterior acetabular rim fracture -Conservative management as per orthopedics recommendations: Weightbearing as tolerated.  No surgery needed   Possible left olecranon bursitis -Aspirate culture grew MRSA: ID following and recommended total of 2 weeks of oral linezolid.  Outpatient follow-up with ID   UTI -Urine culture grew E. coli.  Completed adequate antibiotic treatment   AKI -CT of abdomen/pelvis did not show hydronephrosis.  Creatinine 1.29 today.  Stable.  Hypertension -Blood pressure stable.  Continue amlodipine and metoprolol; lower doses.  Ramipril, triamterene and hydrochlorothiazide remain on hold.   Lactic acidosis: Resolved   Acute metabolic encephalopathy Delirium History of dementia -Currently stable.  Has waxing and waning symptoms.  Patient has advanced dementia.  Bedside sitter has been discontinued.  Goals of care -Palliative care evaluation appreciated.  Patient remains full CODE STATUS.  Outpatient follow-up with palliative care.   Chronic A-fib -Currently rate controlled.  Continue lower dose of metoprolol.  Outpatient follow-up with cardiology.  Xarelto to remain on hold indefinitely   Chronic urinary retention -Currently has indwelling Foley catheter placed by urology.  Will need outpatient follow-up with urology.   Prostate cancer -Intermittent.  Continue home hormone replacement therapy   Liver cyst, pancreatic cyst -Stable on CT scan.   Moderate protein calorie malnutrition with  unintentional weight loss -Follow nutrition recommendations  Discharge Instructions  Discharge Instructions     Ambulatory referral to Infectious Disease   Complete by: As directed

## 2022-01-10 NOTE — Progress Notes (Signed)
Had conversation with patient about his refusal to wear telemetry he stated he is 88 years old and he is not paying for this. I informed him this how his heart is monitored his reply was he doesn't need that.

## 2022-01-10 NOTE — Progress Notes (Signed)
[facility] Progress Note     Subjective: CC-  Daughter at bedside. Patient tired this morning, did not sleep well last night. Denies any pain. No SOB.  Objective: Vital signs in last 24 hours: Temp:  [97.8 F (36.6 C)-99.1 F (37.3 C)] 97.8 F (36.6 C) (12/14 0500) Pulse Rate:  [54-74] 61 (12/14 0500) Resp:  [16-18] 18 (12/14 0500) BP: (121-134)/(62-78) 134/69 (12/14 0500) SpO2:  [95 %-98 %] 95 % (12/14 0500) Last BM Date : 01/09/22  Intake/Output from previous day: 12/13 0701 - 12/14 0700 In: 340 [P.O.:340] Out: 525 [Urine:525] Intake/Output this shift: No intake/output data recorded.  PE: General: pleasant, WD, elderly male who is laying in bed in NAD Heart: regular, rate, and rhythm.   Lungs: CTAB, no wheezes, rhonchi, or rales noted.  Respiratory effort nonlabored on room air MS: L elbow with improved erythema/ edema, scabbing present without purulent drainage, ROM grossly intact   Lab Results:  Recent Labs    01/10/22 0259  WBC 10.3  HGB 8.4*  HCT 26.3*  PLT 275   BMET Recent Labs    01/10/22 0259  NA 136  K 4.5  CL 109  CO2 20*  GLUCOSE 138*  BUN 29*  CREATININE 1.29*  CALCIUM 8.3*   PT/INR No results for input(s): "LABPROT", "INR" in the last 72 hours. CMP     Component Value Date/Time   NA 136 01/10/2022 0259   K 4.5 01/10/2022 0259   CL 109 01/10/2022 0259   CO2 20 (L) 01/10/2022 0259   GLUCOSE 138 (H) 01/10/2022 0259   BUN 29 (H) 01/10/2022 0259   CREATININE 1.29 (H) 01/10/2022 0259   CALCIUM 8.3 (L) 01/10/2022 0259   PROT 5.4 (L) 12/28/2021 1043   ALBUMIN 2.5 (L) 12/28/2021 1043   AST 32 12/28/2021 1043   ALT 17 12/28/2021 1043   ALKPHOS 126 12/28/2021 1043   BILITOT 0.6 12/28/2021 1043   GFRNONAA 53 (L) 01/10/2022 0259   Lipase  No results found for: "LIPASE"     Studies/Results: DG CHEST PORT 1 VIEW  Result Date: 01/09/2022 CLINICAL DATA:  Follow-up pneumothorax EXAM: PORTABLE CHEST 1 VIEW COMPARISON:   01/09/2022 at 0605 hours FINDINGS: Right-sided chest tube remains in place. Stable small right apical pneumothorax. Stable heart size. Aortic atherosclerosis. Persistent bandlike opacity in the right lung base. Left lung remains clear. IMPRESSION: Stable small right apical pneumothorax. Right-sided chest tube remains in place. Electronically Signed   By: Duanne Guess D.O.   On: 01/09/2022 13:34   DG CHEST PORT 1 VIEW  Result Date: 01/09/2022 CLINICAL DATA:  Right-sided chest tube.  History of pneumothorax EXAM: PORTABLE CHEST 1 VIEW COMPARISON:  Yesterday FINDINGS: Right-sided chest tube has been retracted. Midline trachea. Mild cardiomegaly. The left costophrenic angle is excluded. No right or large left pleural effusion. 5% right apical pneumothorax felt to be similar. No congestive failure. Slight increase in right perihilar bandlike opacity. Moderate right hemidiaphragm elevation. Subtle right upper lobe airspace disease is similar. Hyperinflation suggests COPD. IMPRESSION: Similar 5% right apical pneumothorax with right chest tube in place. Slight increased right perihilar bandlike opacity which could represent airspace disease and/or fluid in the fissure. Other areas of right-sided airspace disease are mild and similar. Cardiomegaly and hyperinflation. Electronically Signed   By: Jeronimo Greaves M.D.   On: 01/09/2022 08:15   DG CHEST PORT 1 VIEW  Result Date: 01/08/2022 CLINICAL DATA:  Follow-up pneumothorax. EXAM: PORTABLE CHEST 1 VIEW COMPARISON:  Radiograph earlier today, additional  priors reviewed. FINDINGS: Small pneumothorax is similar or slightly diminished in size from prior exam, currently visualized the level of the posterior third rib at the apex. Lateral component is potentially but not definitively seen. Right chest tube remains in place unchanged in position. Persistent bandlike right perihilar opacity. Small volume persistent right pleural fluid. No other interval change. IMPRESSION:  1. Unchanged or slightly diminished size of right pneumothorax. Right chest tube remains in place. 2. Unchanged bandlike right perihilar opacity and small volume right pleural fluid. Electronically Signed   By: Keith Rake M.D.   On: 01/08/2022 18:32    Anti-infectives: Anti-infectives (From admission, onward)    Start     Dose/Rate Route Frequency Ordered Stop   01/08/22 2200  linezolid (ZYVOX) tablet 600 mg        600 mg Oral Every 12 hours 01/08/22 1604     01/07/22 1400  sulfamethoxazole-trimethoprim (BACTRIM DS) 800-160 MG per tablet 1 tablet  Status:  Discontinued        1 tablet Oral Every 12 hours 01/07/22 1309 01/08/22 1604   01/03/22 1215  amoxicillin-clavulanate (AUGMENTIN) 875-125 MG per tablet 1 tablet  Status:  Discontinued        1 tablet Oral Every 12 hours 01/03/22 1125 01/07/22 1309   01/01/22 1345  cefdinir (OMNICEF) capsule 300 mg  Status:  Discontinued        300 mg Oral Every 12 hours 01/01/22 1259 01/03/22 1125   12/28/21 1215  cefTRIAXone (ROCEPHIN) 1 g in sodium chloride 0.9 % 100 mL IVPB  Status:  Discontinued        1 g 200 mL/hr over 30 Minutes Intravenous  Once 12/28/21 1205 12/28/21 1320   12/28/21 1215  azithromycin (ZITHROMAX) 500 mg in sodium chloride 0.9 % 250 mL IVPB  Status:  Discontinued        500 mg 250 mL/hr over 60 Minutes Intravenous  Once 12/28/21 1205 12/28/21 1320        Assessment/Plan Fall on xarelto   R hemothorax - s/p chest tube placement 12/2.  Given small recurrent PTX with WS trials TCTS was consulted 12/12. Chest tube removed 12/14, repeat CXR pending this AM, management per Dr. Kipp Brood R rib fractures 10-11 - multimodal pain control and pulm toilet ABL anemia - s/p 1u PRBCs 12/1. Hgb 8.4, stable Right anterior acetabular rib fx - per ortho, Dr. Mable Fill, nonop, WBAT RLE Left elbow lac/ olecranon bursitis - from previous fall. Sutures removed 12/7.  s/p aspiration 12/8 by ortho, growing MRSA, on zyvox per ID. Management per  ortho/ID   Chronic A fib on xarelto - would consider holding anticoagulation indefinitely  Chronic urinary retention - I&O caths at home. Foley placed by urology 12/2.  Prostate cancer HTN HLD   ID - linezolid 12/12>> FEN - HH/CM diet VTE - SCDs, sq heparin Foley - placed by urology 12/2   Dispo - Possibly ready for discharge today pending CXR.   LOS: 13 days    [facility] 01/10/2022, 8:05 AM Please see Amion for pager number during day hours 7:00am-4:30pm

## 2022-01-10 NOTE — Progress Notes (Signed)
Attempted to call report to [facility] at [phone]. Transferred a couple times ultimately with no answer. Will try again later. Pt family to transport pt to SNF. No family at bedside.

## 2022-01-10 NOTE — Progress Notes (Signed)
      [address]       [place] [address]             [phone]           Subjective: Patient sleeping and briefly awakened.   Objective: Vital signs in last 24 hours: Temp:  [97.8 F (36.6 C)-99.1 F (37.3 C)] 97.8 F (36.6 C) (12/14 0500) Pulse Rate:  [54-74] 61 (12/14 0500) Resp:  [16-18] 18 (12/14 0500) BP: (121-134)/(62-78) 134/69 (12/14 0500) SpO2:  [95 %-98 %] 95 % (12/14 0500)     Intake/Output from previous day: 12/13 0701 - 12/14 0700 In: 340 [P.O.:340] Out: 525 [Urine:525]   Physical Exam:  Cardiovascular: IRRR IRRR Pulmonary: Clear to auscultation bilaterally Wounds: Dressing is clean and dry.    Lab Results: CBC: Recent Labs    01/10/22 0259  WBC 10.3  HGB 8.4*  HCT 26.3*  PLT 275   BMET:  Recent Labs    01/10/22 0259  NA 136  K 4.5  CL 109  CO2 20*  GLUCOSE 138*  BUN 29*  CREATININE 1.29*  CALCIUM 8.3*    PT/INR: No results for input(s): "LABPROT", "INR" in the last 72 hours. ABG:  INR: Will add last result for INR, ABG once components are confirmed Will add last 4 CBG results once components are confirmed  Assessment/Plan:  1. CV - History of chronic a fib;a fib with CVR. On Toprol XL 50 mg daily, Amlodipine 5 mg daily. He was on Xarelto prior to admission but with fall and rib fractures/hemothorax on hold 2.  Pulmonary - On room air. Right chest tube is to a mini express. Chest tube output recorded as 150 cc for 12 hours (dark blood). There is no air leak.  Will change CXR from PA/LAT CXR to portable If CXR stable, may be discharged from thoracic point of view   Michaelene Dutan M ZimmermanPA-C 01/10/2022,7:16 AM

## 2022-01-10 NOTE — Plan of Care (Signed)
  Problem: Cardiac: Goal: Will achieve and/or maintain adequate cardiac output Outcome: Progressing   Problem: Physical Regulation: Goal: Complications related to the disease process, condition or treatment will be avoided or minimized Outcome: Progressing   Problem: Education: Goal: Knowledge of General Education information will improve Description: Including pain rating scale, medication(s)/side effects and non-pharmacologic comfort measures Outcome: Progressing   Problem: Health Behavior/Discharge Planning: Goal: Ability to manage health-related needs will improve Outcome: Progressing   Problem: Clinical Measurements: Goal: Ability to maintain clinical measurements within normal limits will improve Outcome: Progressing Goal: Will remain free from infection Outcome: Progressing Goal: Diagnostic test results will improve Outcome: Progressing Goal: Respiratory complications will improve Outcome: Progressing   Problem: Activity: Goal: Risk for activity intolerance will decrease Outcome: Progressing   Problem: Nutrition: Goal: Adequate nutrition will be maintained Outcome: Progressing   Problem: Coping: Goal: Level of anxiety will decrease Outcome: Progressing   Problem: Elimination: Goal: Will not experience complications related to bowel motility Outcome: Progressing Goal: Will not experience complications related to urinary retention Outcome: Progressing   Problem: Pain Managment: Goal: General experience of comfort will improve Outcome: Progressing   Problem: Safety: Goal: Ability to remain free from injury will improve Outcome: Progressing   Problem: Skin Integrity: Goal: Risk for impaired skin integrity will decrease Outcome: Progressing

## 2022-01-10 NOTE — Progress Notes (Signed)
Occupational Therapy Treatment Patient Details Name: Jacob Becker MRN: [id number] DOB: 10-09-1929 Today's Date: 01/10/2022   History of present illness 88 y.o. male presents to [facility] hospital on 12/28/2021 after near syncope and a fall. Pt found to have R hemothorax, R rib 10-11 fx, R nondisplaced anterior wall acetabular fx. PMH includes dementia, afib, HTN, CAD, prostate CA.   OT comments  Patient received in supine and agreeable to OT session. Patient demonstrating gains with supine to sit and sit to supine with min assist to min guard assist. Patient able to perform toilet hygiene seated and standing and stand at sink with min guard assist for grooming tasks. Patient is expected to discharge to SNF for continued rehab to address ADLs and functional transfers. Acute OT to continue to follow.    Recommendations for follow up therapy are one component of a multi-disciplinary discharge planning process, led by the attending physician.  Recommendations may be updated based on patient status, additional functional criteria and insurance authorization.    Follow Up Recommendations  Skilled nursing-short term rehab (<3 hours/day)     Assistance Recommended at Discharge Frequent or constant Supervision/Assistance  Patient can return home with the following  A little help with walking and/or transfers;A little help with bathing/dressing/bathroom;Help with stairs or ramp for entrance;Assistance with cooking/housework;Assist for transportation;Direct supervision/assist for medications management;Direct supervision/assist for financial management   Equipment Recommendations  None recommended by OT    Recommendations for Other Services      Precautions / Restrictions Precautions Precautions: Fall Restrictions Weight Bearing Restrictions: No       Mobility Bed Mobility Overal bed mobility: Needs Assistance Bed Mobility: Supine to Sit, Sit to Supine     Supine to sit: Min assist, HOB  elevated Sit to supine: Min guard   General bed mobility comments: able to get legs into bed and required assistance tor aise trunk to get out of bed    Transfers Overall transfer level: Needs assistance Equipment used: Rolling walker (2 wheels) Transfers: Sit to/from Stand, Bed to chair/wheelchair/BSC Sit to Stand: Min assist, Min guard     Step pivot transfers: Min guard     General transfer comment: verbal cues for hand placement and min guard to stand from raised bed, min assist to stand from toilet     Balance Overall balance assessment: Needs assistance Sitting-balance support: No upper extremity supported, Feet supported Sitting balance-Leahy Scale: Good     Standing balance support: Single extremity supported, Bilateral upper extremity supported, During functional activity Standing balance-Leahy Scale: Poor Standing balance comment: reliant on RW and sink for support                           ADL either performed or assessed with clinical judgement   ADL Overall ADL's : Needs assistance/impaired     Grooming: Wash/dry hands;Wash/dry face;Oral care;Min guard;Standing Grooming Details (indicate cue type and reason): at sink         Upper Body Dressing : Set up;Sitting Upper Body Dressing Details (indicate cue type and reason): donn gown to cover back Lower Body Dressing: Moderate assistance;Sitting/lateral leans Lower Body Dressing Details (indicate cue type and reason): patient able to doff sock and required mod assist to [employer] Transfer: Min guard;Ambulation;Rolling walker (2 wheels);BSC/3in1 Toilet Transfer Details (indicate cue type and reason): ambulated to bathroom for toilet transfer Toileting- Clothing Manipulation and Hygiene: Min guard;Sit to/from stand Toileting - Clothing Manipulation Details (indicate cue type and  reason): able to perform toilet hygiene with min guard standign       General ADL Comments: frequent cues for safety     Extremity/Trunk Assessment              Vision       Perception     Praxis      Cognition Arousal/Alertness: Awake/alert Behavior During Therapy: WFL for tasks assessed/performed Overall Cognitive Status: History of cognitive impairments - at baseline                                 General Comments: Oriented to self and locations        Exercises      Shoulder Instructions       General Comments      Pertinent Vitals/ Pain       Pain Assessment Pain Assessment: No/denies pain Pain Intervention(s): Monitored during session  Home Living                                          Prior Functioning/Environment              Frequency  Min 2X/week        Progress Toward Goals  OT Goals(current goals can now be found in the care plan section)  Progress towards OT goals: Progressing toward goals  Acute Rehab OT Goals Patient Stated Goal: go home OT Goal Formulation: With patient Time For Goal Achievement: 01/13/22 Potential to Achieve Goals: Good ADL Goals Pt Will Perform Grooming: with supervision;standing Pt Will Perform Upper Body Bathing: with set-up;with modified independence;sitting Pt Will Perform Lower Body Bathing: with supervision;sit to/from stand;with set-up Pt Will Perform Upper Body Dressing: with set-up;with supervision;sitting Pt Will Perform Lower Body Dressing: with set-up;with supervision;sit to/from stand Pt Will Transfer to Toilet: with supervision;ambulating;regular height toilet;grab bars Pt Will Perform Toileting - Clothing Manipulation and hygiene: with supervision;sit to/from stand  Plan Discharge plan remains appropriate;Frequency remains appropriate    Co-evaluation                 AM-PAC OT "6 Clicks" Daily Activity     Outcome Measure   Help from another person eating meals?: None Help from another person taking care of personal grooming?: A Little Help from another person  toileting, which includes using toliet, bedpan, or urinal?: A Little Help from another person bathing (including washing, rinsing, drying)?: A Little Help from another person to put on and taking off regular upper body clothing?: A Little Help from another person to put on and taking off regular lower body clothing?: A Little 6 Click Score: 19    End of Session Equipment Utilized During Treatment: Rolling walker (2 wheels)  OT Visit Diagnosis: Other abnormalities of gait and mobility (R26.89);Muscle weakness (generalized) (M62.81);History of falling (Z91.81);Other symptoms and signs involving cognitive function   Activity Tolerance Patient tolerated treatment well   Patient Left in bed;with call bell/phone within reach;with bed alarm set   Nurse Communication Mobility status        Time: 1610-9604 OT Time Calculation (min): 21 min  Charges: OT General Charges $OT Visit: 1 Visit OT Treatments $Self Care/Home Management : 8-22 mins  Alfonse Flavors, OTA Acute Rehabilitation Services  Office [phone]   Dewain Penning 01/10/2022, 11:49 AM

## 2022-01-10 NOTE — TOC Transition Note (Signed)
Transition of Care [facility]) - CM/SW Discharge Note   Patient Details  Name: NYSIR FERGUSSON MRN: [id number] Date of Birth: 1929/07/26  [facility] of [facility]) CM/SW Contact:  Carley Hammed, LCSWA Phone Number: 01/10/2022, 2:47 PM   Clinical Narrative:    Pt to be transported to [facility] via family. Nurse to call report to [phone]    Final next level of care: Skilled Nursing Facility Barriers to Discharge: Continued Medical Work up   Patient Goals and CMS Choice   CMS Medicare.gov Compare Post Acute Care list provided to:: Patient Represenative (must comment) (daughter Dondra Spry) Choice offered to / list presented to : Adult Children  Discharge Placement                       Discharge Plan and Services   Discharge Planning Services: CM Consult Post Acute Care Choice: Home Health          DME Arranged:  (awaiting decision walker vs rollator)         HH Arranged: RN, PT, OT HH Agency: Other - See comment ([facility]) Date HH Agency Contacted: 12/31/21 Time HH Agency Contacted: 1425 Representative spoke with at [facility] Agency: Eber Jones  Social Determinants of Health (SDOH) Interventions     Readmission Risk Interventions     No data to display

## 2022-01-10 NOTE — Progress Notes (Signed)
Telephone call to Brett Canales son-law to speak with patient and allowed patient to talk with him to reassure him of his safety and that he should remain in the bed.

## 2022-01-10 NOTE — Progress Notes (Signed)
[facility] hospitalist  A. Virgel Manifold NP notifed via chat patient is repeats his attempt to get OOB and that patient has no IV site but will take po medication

## 2022-01-11 ENCOUNTER — Encounter: Payer: Self-pay | Admitting: Podiatry

## 2022-01-15 DIAGNOSIS — L309 Dermatitis, unspecified: Secondary | ICD-10-CM | POA: Diagnosis not present

## 2022-01-16 ENCOUNTER — Other Ambulatory Visit: Payer: Self-pay | Admitting: *Deleted

## 2022-01-16 DIAGNOSIS — S32434D Nondisplaced fracture of anterior column [iliopubic] of right acetabulum, subsequent encounter for fracture with routine healing: Secondary | ICD-10-CM | POA: Diagnosis not present

## 2022-01-16 DIAGNOSIS — J942 Hemothorax: Secondary | ICD-10-CM | POA: Diagnosis not present

## 2022-01-16 DIAGNOSIS — N319 Neuromuscular dysfunction of bladder, unspecified: Secondary | ICD-10-CM | POA: Diagnosis not present

## 2022-01-16 DIAGNOSIS — Z9181 History of falling: Secondary | ICD-10-CM | POA: Diagnosis not present

## 2022-01-16 DIAGNOSIS — M7022 Olecranon bursitis, left elbow: Secondary | ICD-10-CM | POA: Diagnosis not present

## 2022-01-16 DIAGNOSIS — R55 Syncope and collapse: Secondary | ICD-10-CM | POA: Diagnosis not present

## 2022-01-16 DIAGNOSIS — I25119 Atherosclerotic heart disease of native coronary artery with unspecified angina pectoris: Secondary | ICD-10-CM | POA: Diagnosis not present

## 2022-01-16 DIAGNOSIS — I4891 Unspecified atrial fibrillation: Secondary | ICD-10-CM | POA: Diagnosis not present

## 2022-01-16 DIAGNOSIS — F0390 Unspecified dementia without behavioral disturbance: Secondary | ICD-10-CM | POA: Diagnosis not present

## 2022-01-16 DIAGNOSIS — E785 Hyperlipidemia, unspecified: Secondary | ICD-10-CM | POA: Diagnosis not present

## 2022-01-16 NOTE — Patient Outreach (Signed)
Per [facility] Mr. Bose resides in [place] [facility]. PCP office [facility] has [facility] care management.  Secure communication sent to SNF social work team and therapy Freight forwarder to make aware Probation officer following for transition plans.   Will continue to follow.   Marthenia Rolling, MSN, RN,BSN [place] Acute Care Coordinator [phone] (Direct dial)

## 2022-01-17 DIAGNOSIS — N319 Neuromuscular dysfunction of bladder, unspecified: Secondary | ICD-10-CM | POA: Diagnosis not present

## 2022-01-17 DIAGNOSIS — R338 Other retention of urine: Secondary | ICD-10-CM | POA: Diagnosis not present

## 2022-01-18 ENCOUNTER — Ambulatory Visit (INDEPENDENT_AMBULATORY_CARE_PROVIDER_SITE_OTHER): Payer: Medicare Other | Admitting: Infectious Diseases

## 2022-01-18 ENCOUNTER — Other Ambulatory Visit: Payer: Self-pay

## 2022-01-18 VITALS — BP 134/69 | HR 78 | Temp 97.6°F | Resp 16

## 2022-01-18 DIAGNOSIS — N390 Urinary tract infection, site not specified: Secondary | ICD-10-CM

## 2022-01-18 DIAGNOSIS — B9620 Unspecified Escherichia coli [E. coli] as the cause of diseases classified elsewhere: Secondary | ICD-10-CM | POA: Diagnosis not present

## 2022-01-18 DIAGNOSIS — I10 Essential (primary) hypertension: Secondary | ICD-10-CM | POA: Diagnosis not present

## 2022-01-18 DIAGNOSIS — Z5181 Encounter for therapeutic drug level monitoring: Secondary | ICD-10-CM | POA: Diagnosis not present

## 2022-01-18 DIAGNOSIS — L309 Dermatitis, unspecified: Secondary | ICD-10-CM | POA: Diagnosis not present

## 2022-01-18 DIAGNOSIS — M7022 Olecranon bursitis, left elbow: Secondary | ICD-10-CM

## 2022-01-18 MED ORDER — LINEZOLID 600 MG PO TABS: 600.0000 mg | ORAL_TABLET | Freq: Two times a day (BID) | ORAL | 0 refills | Status: DC

## 2022-01-18 NOTE — Progress Notes (Addendum)
Patient Active Problem List   Diagnosis Date Noted   Near syncope 12/28/2021   AKI (acute kidney injury) ([place]) 12/28/2021   Hemothorax on right 12/28/2021   Hyperkalemia 12/28/2021   A-fib ([place]) 12/28/2021   Diarrhea 12/05/2020   H/O abnormal weight loss 12/05/2020   H/O therapeutic radiation 12/05/2020   Intermittent self-catheterization of bladder 12/05/2020   Loss of appetite 12/05/2020   Mild cognitive impairment with memory loss 12/05/2020   Nocturia 12/05/2020   Anemia, iron deficiency 06/24/2017   AF (atrial fibrillation) ([place]) 07/07/2014   Coronary artery disease involving native heart 07/07/2014   Essential hypertension 07/07/2014   Chronic anticoagulation 07/07/2014   Hyperlipidemia 07/07/2014   Prostate cancer ([place]) 07/07/2014   Neurogenic bladder disorder 10/28/2011   Post-traumatic male urethral stricture 10/28/2011   Retention of urine 10/09/2011   Current Outpatient Medications on File Prior to Visit  Medication Sig Dispense Refill   acetaminophen (TYLENOL) 500 MG tablet Take 2 tablets (1,000 mg total) by mouth 3 (three) times daily.     amLODipine (NORVASC) 10 MG tablet      amLODipine (NORVASC) 10 MG tablet Take 0.5 tablets (5 mg total) by mouth daily.     calcium citrate (CALCITRATE - DOSED IN MG ELEMENTAL CALCIUM) 950 (200 Ca) MG tablet Take 500 mg of elemental calcium by mouth 2 (two) times daily.     Calcium-Magnesium-Vitamin D (CALCIUM 500 PO) Take 500 mg by mouth 2 (two) times daily.     Coenzyme Q10 (CO Q 10) 100 MG CAPS Take 100 mg by mouth daily.     Coenzyme Q10 (COQ10) 100 MG CAPS Take 1 capsule by mouth daily.     colesevelam (WELCHOL) 625 MG tablet Take 625 mg by mouth 2 (two) times daily with a meal.     colesevelam (WELCHOL) 625 MG tablet Take 1,250 mg by mouth 2 (two) times daily.     diphenhydramine-acetaminophen (TYLENOL PM) 25-500 MG TABS tablet Take 2 tablets by mouth at bedtime as needed.     docusate sodium (COLACE) 100 MG capsule  Take 1 capsule (100 mg total) by mouth 2 (two) times daily. 10 capsule 0   doxycycline (VIBRAMYCIN) 100 MG capsule Take 100 mg by mouth 2 (two) times daily.     Ensure (ENSURE) Take 237 mLs by mouth daily as needed (as directed).     enzalutamide (XTANDI) 40 MG capsule Take 160 mg by mouth daily.     ferrous sulfate 325 (65 FE) MG tablet Take 1 tablet (325 mg total) by mouth daily with breakfast. 90 tablet 3   imiquimod (ALDARA) 5 % cream APPLY TO SCALP AND EAR NIGHTLY MONDAY THRU FRIDAY FOR 4 WEEKS     lidocaine (LIDODERM) 5 % Place 1 patch onto the skin daily. Remove & Discard patch within 12 hours or as directed by MD 5 patch 0   linezolid (ZYVOX) 600 MG tablet Take 1 tablet (600 mg total) by mouth every 12 (twelve) hours for 12 days. 24 tablet 0   methocarbamol (ROBAXIN) 500 MG tablet Take 1 tablet (500 mg total) by mouth every 6 (six) hours as needed for muscle spasms. 20 tablet 0   metoprolol succinate (TOPROL-XL) 100 MG 24 hr tablet Take 1 tablet (15m) every morning and 0.5 tablet (553m every evening) Take with or immediately following a meal. 135 tablet 3   metoprolol succinate (TOPROL-XL) 100 MG 24 hr tablet Take 0.5 tablets (50 mg total) by mouth daily.  Multiple Vitamin (MULTIVITAMIN) capsule Take 1 capsule by mouth daily.     nitroGLYCERIN (NITROSTAT) 0.4 MG SL tablet ONE TABLET UNDER TONGUE EVERY 5 MINUTES AS NEEDED FOR CHEST PAIN 25 tablet 3   NON FORMULARY Antifungal toenail solution from [place], order faxed 09/08/2018 [facility]     Omega-3 Fatty Acids (FISH OIL PO) Take 1,000 mg by mouth daily.     Omega-3 Fatty Acids (FISH OIL) 1000 MG CAPS Take 1 capsule by mouth in the morning and at bedtime.     polyethylene glycol (MIRALAX / GLYCOLAX) 17 g packet Take 17 g by mouth daily as needed for mild constipation. 14 each 0   ramipril (ALTACE) 10 MG capsule Take 1 capsule (10 mg total) by mouth 2 (two) times daily. 180 capsule 2   rosuvastatin (CRESTOR) 40 MG tablet Take  40 mg by mouth daily.     rosuvastatin (CRESTOR) 40 MG tablet Take 40 mg by mouth daily.     triamcinolone (KENALOG) 0.1 % Apply topically 2 (two) times daily.     triamterene-hydrochlorothiazide (DYAZIDE) 37.5-25 MG capsule TAKE ONE CAPSULE BY MOUTH EVERY DAY **NEED OFFICE VISIT** 90 capsule 3   XARELTO 20 MG TABS tablet TAKE ONE TABLET EACH DAY 30 tablet 6   XTANDI 40 MG tablet Take 160 mg by mouth at bedtime.     No current facility-administered medications on file prior to visit.    Subjective: 88 Y O male with PMH as below including metastatic prostate ca in remission on chronic hormone, neurogenic bladder s/p Foley's catheter, HLD, HTN, chronic Afib on AC, HTN, CAD who is here for HFU for left olecranon bursitis.   Recently admitted 12/1-12/14 for syncope and fall. Patient sustained rib fractures with right hemothorax: chest tube was placed.  He was also found to have right anterior acetabular rim fracture, orthopedics recommended conservative management. Hemothorax was drained by chest tube, however patient developed small pneumothorax. Multiple attempts were made to wean off chest tube however patient continued to have small pneumothorax. Due to recurrence of pneumothorax, CTVS consulted.  Chest tube was switched to mini express which was subsequently removed. Complete course of appropriate abtx for probable E coli UTI Patient also had left olecranon bursitis and s/p aspiration on 12/8 by Orthopedics which grew MRSA. Seen by ID and recommended 2 weeks course of linezolid. He was then discharged to SNF on 12/14.   01/18/22 Accompanied by son and daughter. Patient is sitting in a wheel chair. He has lots of complaints about the SNF, food, etc. Patient and daughter states the redness and swelling in the left elbow have been improving as compared to when it started. Reviewed MAR, he has been getting PO linezolid. Of note, he was also started on levofloxacin on 12/19 at SNF for ? Increasing  redness at the left elbow ( per staff at the facility). He has full ROM of left elbow with no restriction. Denies any warmth/tenderness.   Daughter also states that patient will be discharged from current SNF to assisted living facility on 12/26 as he doesnot like his current SNF. Doing well with no concerns otherwise.   Review of Systems: all systems reviewed with pertinent positives and negatives as listed above   Past Medical History:  Diagnosis Date   AF (atrial fibrillation) ([facility])    Arthritis    Cancer ([place])    prostate   Dysrhythmia    af   Hyperlipemia    Hypertension    Metastatic bone cancer  from prostate   Wears glasses    Past Surgical History:  Procedure Laterality Date   CHEILECTOMY Right 10/07/2013   Procedure: RIGHT HALLUX CHEILECTOMY;  Surgeon: [name], MD;  Location: [facility];  Service: Orthopedics;  Laterality: Right;   COLONOSCOPY     CYSTOSCOPY PROSTATE W/ LASER  2012   turp   HERNIA REPAIR     rt and lt ing   KNEE ARTHROSCOPY  2005   right   TONSILLECTOMY       Social History   Tobacco Use   Smokeless tobacco: Never  Vaping Use   Vaping Use: Never used  Substance Use Topics   Alcohol use: Yes    Comment: daily   Drug use: No    Family History  Problem Relation Age of Onset   Heart failure Father     No Known Allergies  Health Maintenance  Topic Date Due   Zoster Vaccines- Shingrix (1 of 2) Never done   Pneumonia Vaccine 84+ Years old (1 - PCV) Never done   COVID-19 Vaccine (3 - Pfizer risk series) 04/28/2019   INFLUENZA VACCINE  08/28/2021   Medicare Annual Wellness (AWV)  04/11/2022   DTaP/Tdap/Td (2 - Td or Tdap) 12/22/2031   HPV VACCINES  Aged Out    Objective:  Vitals:   01/18/22 1106  BP: 134/69  Pulse: 78  Resp: 16  Temp: 97.6 F (36.4 C)  TempSrc: Oral  SpO2: 96%   There is no height or weight on file to calculate BMI.  Physical Exam Constitutional:      Appearance: Elderly white male.  Sitting in a wheel chair  HENT:     Head: Normocephalic and atraumatic.      Mouth: Mucous membranes are moist.  Eyes:    Conjunctiva/sclera: Conjunctivae normal.     Pupils: Pupils are equal, round, and bilaterally symmetrical   Cardiovascular:     Rate and Rhythm: Normal rate and Irregular rhythm.     Heart sounds:   Pulmonary:     Effort: Pulmonary effort is normal.     Breath sounds: Normal breath sounds.   Abdominal:     General: Non distended     Palpations: soft. Non tender  Musculoskeletal:        General: Normal range of motion. Left elbow with mild swelling/some redness/scabbed but no warmth and tenderness. Full ROM of left elbow   Skin:    General: Skin is warm and dry.     Comments:  Neurological:     General:     Mental Status: awake, alert, Impaired memory  Psychiatric:        Mood and Affect: Irritable   Lab Results Lab Results  Component Value Date   WBC 10.3 01/10/2022   HGB 8.4 (L) 01/10/2022   HCT 26.3 (L) 01/10/2022   MCV 96.3 01/10/2022   PLT 275 01/10/2022    Lab Results  Component Value Date   CREATININE 1.29 (H) 01/10/2022   BUN 29 (H) 01/10/2022   NA 136 01/10/2022   K 4.5 01/10/2022   CL 109 01/10/2022   CO2 20 (L) 01/10/2022    Lab Results  Component Value Date   ALT 17 12/28/2021   AST 32 12/28/2021   ALKPHOS 126 12/28/2021   BILITOT 0.6 12/28/2021    Lab Results  Component Value Date   CHOL 152 11/05/2018   HDL 63 11/05/2018   LDLCALC 75 11/05/2018   TRIG 72 11/05/2018  CHOLHDL 2.4 11/05/2018   No results found for: "LABRPR", "RPRTITER" No results found for: "HIV1RNAQUANT", "HIV1RNAVL", "CD4TABS"   Assessment/Plan  # Left Olecranon bursitis  Clinically improving, minimal redness and swelling  Complete 3 weeks of linezolid. EOT 01/25/22 DC levofloxacin  Fu in 7-10 days   # Medication Monitoring  - CBC, ESR and CRP today   I have personally spent 42 minutes involved in face-to-face and non-face-to-face  activities for this patient on the day of the visit. Professional time spent includes the following activities: Preparing to see the patient (review of tests), Obtaining and/or reviewing separately obtained history (admission/discharge record), Performing a medically appropriate examination and/or evaluation , Ordering medications/tests/procedures, referring and communicating with other health care professionals, Documenting clinical information in the EMR, Independently interpreting results (not separately reported), Communicating results to the patient/family/caregiver, Counseling and educating the patient/family/caregiver and Care coordination (not separately reported).   Wilber Oliphant, [facility] 01/18/2022, 1:48 PM

## 2022-01-19 LAB — CBC
HCT: 27.3 % — ABNORMAL LOW (ref 38.5–50.0)
Hemoglobin: 9 g/dL — ABNORMAL LOW (ref 13.2–17.1)
MCH: 31.5 pg (ref 27.0–33.0)
MCHC: 33 g/dL (ref 32.0–36.0)
MCV: 95.5 fL (ref 80.0–100.0)
MPV: 9.7 fL (ref 7.5–12.5)
Platelets: 183 10*3/uL (ref 140–400)
RBC: 2.86 10*6/uL — ABNORMAL LOW (ref 4.20–5.80)
RDW: 13.5 % (ref 11.0–15.0)
WBC: 7.1 10*3/uL (ref 3.8–10.8)

## 2022-01-19 LAB — C-REACTIVE PROTEIN: CRP: 60.5 mg/L — ABNORMAL HIGH (ref <8.0)

## 2022-01-19 LAB — SEDIMENTATION RATE: Sed Rate: >130 mm/h — ABNORMAL HIGH (ref 0–20)

## 2022-01-22 DIAGNOSIS — F0390 Unspecified dementia without behavioral disturbance: Secondary | ICD-10-CM | POA: Diagnosis not present

## 2022-01-22 DIAGNOSIS — I10 Essential (primary) hypertension: Secondary | ICD-10-CM | POA: Diagnosis not present

## 2022-01-22 DIAGNOSIS — L309 Dermatitis, unspecified: Secondary | ICD-10-CM | POA: Diagnosis not present

## 2022-01-23 ENCOUNTER — Telehealth: Payer: Self-pay

## 2022-01-23 DIAGNOSIS — I4891 Unspecified atrial fibrillation: Secondary | ICD-10-CM | POA: Diagnosis not present

## 2022-01-23 DIAGNOSIS — S32434D Nondisplaced fracture of anterior column [iliopubic] of right acetabulum, subsequent encounter for fracture with routine healing: Secondary | ICD-10-CM | POA: Diagnosis not present

## 2022-01-23 DIAGNOSIS — J942 Hemothorax: Secondary | ICD-10-CM | POA: Diagnosis not present

## 2022-01-23 DIAGNOSIS — I25119 Atherosclerotic heart disease of native coronary artery with unspecified angina pectoris: Secondary | ICD-10-CM | POA: Diagnosis not present

## 2022-01-23 DIAGNOSIS — N319 Neuromuscular dysfunction of bladder, unspecified: Secondary | ICD-10-CM | POA: Diagnosis not present

## 2022-01-23 DIAGNOSIS — R55 Syncope and collapse: Secondary | ICD-10-CM | POA: Diagnosis not present

## 2022-01-23 DIAGNOSIS — E785 Hyperlipidemia, unspecified: Secondary | ICD-10-CM | POA: Diagnosis not present

## 2022-01-23 DIAGNOSIS — Z9181 History of falling: Secondary | ICD-10-CM | POA: Diagnosis not present

## 2022-01-23 DIAGNOSIS — F0390 Unspecified dementia without behavioral disturbance: Secondary | ICD-10-CM | POA: Diagnosis not present

## 2022-01-23 DIAGNOSIS — M7022 Olecranon bursitis, left elbow: Secondary | ICD-10-CM | POA: Diagnosis not present

## 2022-01-23 NOTE — Telephone Encounter (Signed)
-----   Message from Rosiland Oz, MD sent at 01/22/2022  5:11 PM EST ----- Labs noted  Elevated inflammatory markers  Please advise to continue linezolid as well as fu as advised during clinic visit.

## 2022-01-23 NOTE — Telephone Encounter (Signed)
Spoke to patients daughter Baker Janus aware of result and schedule patient with next available appt with Dr. Linus Salmons on 1/5 @ 10 am will call back if needs to rescheduled patient is being placed in a assistant living facility.

## 2022-01-24 ENCOUNTER — Other Ambulatory Visit: Payer: Self-pay | Admitting: *Deleted

## 2022-01-24 NOTE — Patient Outreach (Addendum)
[place] Coordinator follow up. Verified in [facility] Mr. Nunziata discharged from [facility] on 01/23/22.  PCP office [place] [facility] has [facility] care management services. Will send secure notification of SNF discharge.   Notification received from [facility] social worker, Bubba Hales. Mr. Anschutz transitioned to [facility]. Update of disposition sent to [facility].  Marthenia Rolling, MSN, RN,BSN [place] Acute Care Coordinator [phone] (Direct dial)

## 2022-01-25 DIAGNOSIS — L89022 Pressure ulcer of left elbow, stage 2: Secondary | ICD-10-CM | POA: Diagnosis not present

## 2022-01-25 DIAGNOSIS — N35014 Post-traumatic urethral stricture, male, unspecified: Secondary | ICD-10-CM | POA: Diagnosis not present

## 2022-01-25 DIAGNOSIS — E440 Moderate protein-calorie malnutrition: Secondary | ICD-10-CM | POA: Diagnosis not present

## 2022-01-25 DIAGNOSIS — Z79899 Other long term (current) drug therapy: Secondary | ICD-10-CM | POA: Diagnosis not present

## 2022-01-25 DIAGNOSIS — S32434D Nondisplaced fracture of anterior column [iliopubic] of right acetabulum, subsequent encounter for fracture with routine healing: Secondary | ICD-10-CM | POA: Diagnosis not present

## 2022-01-25 DIAGNOSIS — I2510 Atherosclerotic heart disease of native coronary artery without angina pectoris: Secondary | ICD-10-CM | POA: Diagnosis not present

## 2022-01-25 DIAGNOSIS — Z87440 Personal history of urinary (tract) infections: Secondary | ICD-10-CM | POA: Diagnosis not present

## 2022-01-25 DIAGNOSIS — D500 Iron deficiency anemia secondary to blood loss (chronic): Secondary | ICD-10-CM | POA: Diagnosis not present

## 2022-01-25 DIAGNOSIS — R339 Retention of urine, unspecified: Secondary | ICD-10-CM | POA: Diagnosis not present

## 2022-01-25 DIAGNOSIS — I10 Essential (primary) hypertension: Secondary | ICD-10-CM | POA: Diagnosis not present

## 2022-01-25 DIAGNOSIS — I482 Chronic atrial fibrillation, unspecified: Secondary | ICD-10-CM | POA: Diagnosis not present

## 2022-01-25 DIAGNOSIS — K862 Cyst of pancreas: Secondary | ICD-10-CM | POA: Diagnosis not present

## 2022-01-25 DIAGNOSIS — Z9181 History of falling: Secondary | ICD-10-CM | POA: Diagnosis not present

## 2022-01-25 DIAGNOSIS — R634 Abnormal weight loss: Secondary | ICD-10-CM | POA: Diagnosis not present

## 2022-01-25 DIAGNOSIS — K7689 Other specified diseases of liver: Secondary | ICD-10-CM | POA: Diagnosis not present

## 2022-01-25 DIAGNOSIS — Z6823 Body mass index (BMI) 23.0-23.9, adult: Secondary | ICD-10-CM | POA: Diagnosis not present

## 2022-01-25 DIAGNOSIS — C61 Malignant neoplasm of prostate: Secondary | ICD-10-CM | POA: Diagnosis not present

## 2022-01-25 DIAGNOSIS — F0390 Unspecified dementia without behavioral disturbance: Secondary | ICD-10-CM | POA: Diagnosis not present

## 2022-01-25 DIAGNOSIS — E875 Hyperkalemia: Secondary | ICD-10-CM | POA: Diagnosis not present

## 2022-01-25 DIAGNOSIS — N319 Neuromuscular dysfunction of bladder, unspecified: Secondary | ICD-10-CM | POA: Diagnosis not present

## 2022-01-29 DIAGNOSIS — I2510 Atherosclerotic heart disease of native coronary artery without angina pectoris: Secondary | ICD-10-CM | POA: Diagnosis not present

## 2022-01-29 DIAGNOSIS — I10 Essential (primary) hypertension: Secondary | ICD-10-CM | POA: Diagnosis not present

## 2022-01-29 DIAGNOSIS — C61 Malignant neoplasm of prostate: Secondary | ICD-10-CM | POA: Diagnosis not present

## 2022-01-29 DIAGNOSIS — L89022 Pressure ulcer of left elbow, stage 2: Secondary | ICD-10-CM | POA: Diagnosis not present

## 2022-01-29 DIAGNOSIS — F0390 Unspecified dementia without behavioral disturbance: Secondary | ICD-10-CM | POA: Diagnosis not present

## 2022-01-29 DIAGNOSIS — I482 Chronic atrial fibrillation, unspecified: Secondary | ICD-10-CM | POA: Diagnosis not present

## 2022-02-01 ENCOUNTER — Other Ambulatory Visit: Payer: Self-pay

## 2022-02-01 ENCOUNTER — Encounter: Payer: Self-pay | Admitting: Internal Medicine

## 2022-02-01 ENCOUNTER — Telehealth: Payer: Self-pay

## 2022-02-01 ENCOUNTER — Ambulatory Visit (INDEPENDENT_AMBULATORY_CARE_PROVIDER_SITE_OTHER): Payer: Medicare Other | Admitting: Internal Medicine

## 2022-02-01 VITALS — BP 163/84 | HR 71 | Temp 97.8°F | Ht 74.0 in

## 2022-02-01 DIAGNOSIS — I10 Essential (primary) hypertension: Secondary | ICD-10-CM

## 2022-02-01 DIAGNOSIS — M7022 Olecranon bursitis, left elbow: Secondary | ICD-10-CM | POA: Diagnosis not present

## 2022-02-01 DIAGNOSIS — Z5181 Encounter for therapeutic drug level monitoring: Secondary | ICD-10-CM | POA: Diagnosis not present

## 2022-02-01 NOTE — Telephone Encounter (Signed)
[place] [phone]) and spoke with Wendelyn Breslow, RN. Reviewed physician's office visit form. Relayed order from Dr. Linus Salmons to stop linezolid, and that patient can follow up as needed. RN verbalized understanding and had no additional questions.  Binnie Kand, RN

## 2022-02-01 NOTE — Progress Notes (Signed)
   Subjective:    Patient ID: Jacob Becker, male    DOB: 08-28-1929, 89 y.o.   MRN: [id number]  HPI Here for follow up of olecranon bursitis. He was admitted in December following a fall and developed left arm olecranon bursitis.  Aspiration done and culture with MRSA and treated with iv and transition to oral linezolid.  He was seen by Dr. [place] Bali 12/22 and noted improvement though inflammatory markers elevated so linezolid was extended and he remains on it now.  No complaints, no pain in his elbow.  No fever, no chills, no peripheral neuropathy symptoms that are new.    Review of Systems  Constitutional:  Negative for fatigue.  Gastrointestinal:  Negative for diarrhea.  Skin:  Negative for rash.       Objective:   Physical Exam Eyes:     General: No scleral icterus. Pulmonary:     Effort: Pulmonary effort is normal.  Musculoskeletal:     Comments: Left elbow with no erythema, closed sore area with no drainage, dry, no warmth; full ROM of the elbow  Skin:    Findings: No rash.  Neurological:     Mental Status: He is alert.   SH: now in a new assisted living and pleased with his new location        Assessment & Plan:

## 2022-02-01 NOTE — Assessment & Plan Note (Signed)
Appears well-healed now and will stop antibiotics now and observe off treatment.  He will call with any new concerns, otherwise will follow up as needed.  ADDENDUM 02/05/22: ESR noted to be up but CRP now normalized which is reassuring and likely ESR is lagging.  No concerns clinically so will continue with the plan as above.

## 2022-02-01 NOTE — Assessment & Plan Note (Signed)
I will check his cmp and cbc to be sure no side effects from linezolid

## 2022-02-01 NOTE — Assessment & Plan Note (Signed)
Blood pressure a bit elevated and he will have it rechecked at his facility

## 2022-02-02 LAB — SEDIMENTATION RATE: Sed Rate: 125 mm/h — ABNORMAL HIGH (ref 0–20)

## 2022-02-02 LAB — CBC
HCT: 23.3 % — ABNORMAL LOW (ref 38.5–50.0)
Hemoglobin: 7.6 g/dL — ABNORMAL LOW (ref 13.2–17.1)
MCH: 30.5 pg (ref 27.0–33.0)
MCHC: 32.6 g/dL (ref 32.0–36.0)
MCV: 93.6 fL (ref 80.0–100.0)
MPV: 10 fL (ref 7.5–12.5)
Platelets: 268 10*3/uL (ref 140–400)
RBC: 2.49 10*6/uL — ABNORMAL LOW (ref 4.20–5.80)
RDW: 14.2 % (ref 11.0–15.0)
WBC: 7.7 10*3/uL (ref 3.8–10.8)

## 2022-02-02 LAB — COMPLETE METABOLIC PANEL WITH GFR
AG Ratio: 1.1 (calc) (ref 1.0–2.5)
ALT: 7 U/L — ABNORMAL LOW (ref 9–46)
AST: 13 U/L (ref 10–35)
Albumin: 3.3 g/dL — ABNORMAL LOW (ref 3.6–5.1)
Alkaline phosphatase (APISO): 172 U/L — ABNORMAL HIGH (ref 35–144)
BUN: 24 mg/dL (ref 7–25)
CO2: 23 mmol/L (ref 20–32)
Calcium: 8.7 mg/dL (ref 8.6–10.3)
Chloride: 109 mmol/L (ref 98–110)
Creat: 1.07 mg/dL (ref 0.70–1.22)
Globulin: 3 g/dL (calc) (ref 1.9–3.7)
Glucose, Bld: 123 mg/dL — ABNORMAL HIGH (ref 65–99)
Potassium: 4.4 mmol/L (ref 3.5–5.3)
Sodium: 143 mmol/L (ref 135–146)
Total Bilirubin: 0.3 mg/dL (ref 0.2–1.2)
Total Protein: 6.3 g/dL (ref 6.1–8.1)
eGFR: 66 mL/min/{1.73_m2} (ref >=60)

## 2022-02-02 LAB — C-REACTIVE PROTEIN: CRP: 2.8 mg/L (ref <8.0)

## 2022-02-05 DIAGNOSIS — I10 Essential (primary) hypertension: Secondary | ICD-10-CM | POA: Diagnosis not present

## 2022-02-05 DIAGNOSIS — I482 Chronic atrial fibrillation, unspecified: Secondary | ICD-10-CM | POA: Diagnosis not present

## 2022-02-05 DIAGNOSIS — F0390 Unspecified dementia without behavioral disturbance: Secondary | ICD-10-CM | POA: Diagnosis not present

## 2022-02-05 DIAGNOSIS — I2510 Atherosclerotic heart disease of native coronary artery without angina pectoris: Secondary | ICD-10-CM | POA: Diagnosis not present

## 2022-02-05 DIAGNOSIS — L89022 Pressure ulcer of left elbow, stage 2: Secondary | ICD-10-CM | POA: Diagnosis not present

## 2022-02-05 DIAGNOSIS — C61 Malignant neoplasm of prostate: Secondary | ICD-10-CM | POA: Diagnosis not present

## 2022-02-07 DIAGNOSIS — N318 Other neuromuscular dysfunction of bladder: Secondary | ICD-10-CM | POA: Diagnosis not present

## 2022-02-07 DIAGNOSIS — C61 Malignant neoplasm of prostate: Secondary | ICD-10-CM | POA: Diagnosis not present

## 2022-02-07 DIAGNOSIS — R338 Other retention of urine: Secondary | ICD-10-CM | POA: Diagnosis not present

## 2022-02-08 DIAGNOSIS — L89022 Pressure ulcer of left elbow, stage 2: Secondary | ICD-10-CM | POA: Diagnosis not present

## 2022-02-08 DIAGNOSIS — F0390 Unspecified dementia without behavioral disturbance: Secondary | ICD-10-CM | POA: Diagnosis not present

## 2022-02-08 DIAGNOSIS — I2510 Atherosclerotic heart disease of native coronary artery without angina pectoris: Secondary | ICD-10-CM | POA: Diagnosis not present

## 2022-02-08 DIAGNOSIS — I482 Chronic atrial fibrillation, unspecified: Secondary | ICD-10-CM | POA: Diagnosis not present

## 2022-02-08 DIAGNOSIS — I10 Essential (primary) hypertension: Secondary | ICD-10-CM | POA: Diagnosis not present

## 2022-02-08 DIAGNOSIS — C61 Malignant neoplasm of prostate: Secondary | ICD-10-CM | POA: Diagnosis not present

## 2022-02-12 DIAGNOSIS — I482 Chronic atrial fibrillation, unspecified: Secondary | ICD-10-CM | POA: Diagnosis not present

## 2022-02-12 DIAGNOSIS — I10 Essential (primary) hypertension: Secondary | ICD-10-CM | POA: Diagnosis not present

## 2022-02-12 DIAGNOSIS — L89022 Pressure ulcer of left elbow, stage 2: Secondary | ICD-10-CM | POA: Diagnosis not present

## 2022-02-12 DIAGNOSIS — I2510 Atherosclerotic heart disease of native coronary artery without angina pectoris: Secondary | ICD-10-CM | POA: Diagnosis not present

## 2022-02-12 DIAGNOSIS — C61 Malignant neoplasm of prostate: Secondary | ICD-10-CM | POA: Diagnosis not present

## 2022-02-12 DIAGNOSIS — F0390 Unspecified dementia without behavioral disturbance: Secondary | ICD-10-CM | POA: Diagnosis not present

## 2022-02-15 DIAGNOSIS — L89022 Pressure ulcer of left elbow, stage 2: Secondary | ICD-10-CM | POA: Diagnosis not present

## 2022-02-15 DIAGNOSIS — I2510 Atherosclerotic heart disease of native coronary artery without angina pectoris: Secondary | ICD-10-CM | POA: Diagnosis not present

## 2022-02-15 DIAGNOSIS — I10 Essential (primary) hypertension: Secondary | ICD-10-CM | POA: Diagnosis not present

## 2022-02-15 DIAGNOSIS — F0390 Unspecified dementia without behavioral disturbance: Secondary | ICD-10-CM | POA: Diagnosis not present

## 2022-02-15 DIAGNOSIS — C61 Malignant neoplasm of prostate: Secondary | ICD-10-CM | POA: Diagnosis not present

## 2022-02-15 DIAGNOSIS — I482 Chronic atrial fibrillation, unspecified: Secondary | ICD-10-CM | POA: Diagnosis not present

## 2022-02-18 DIAGNOSIS — I482 Chronic atrial fibrillation, unspecified: Secondary | ICD-10-CM | POA: Diagnosis not present

## 2022-02-18 DIAGNOSIS — I2510 Atherosclerotic heart disease of native coronary artery without angina pectoris: Secondary | ICD-10-CM | POA: Diagnosis not present

## 2022-02-18 DIAGNOSIS — N312 Flaccid neuropathic bladder, not elsewhere classified: Secondary | ICD-10-CM | POA: Diagnosis not present

## 2022-02-18 DIAGNOSIS — C61 Malignant neoplasm of prostate: Secondary | ICD-10-CM | POA: Diagnosis not present

## 2022-02-18 DIAGNOSIS — I10 Essential (primary) hypertension: Secondary | ICD-10-CM | POA: Diagnosis not present

## 2022-02-18 DIAGNOSIS — L89022 Pressure ulcer of left elbow, stage 2: Secondary | ICD-10-CM | POA: Diagnosis not present

## 2022-02-18 DIAGNOSIS — I110 Hypertensive heart disease with heart failure: Secondary | ICD-10-CM | POA: Diagnosis not present

## 2022-02-18 DIAGNOSIS — M7022 Olecranon bursitis, left elbow: Secondary | ICD-10-CM | POA: Diagnosis not present

## 2022-02-18 DIAGNOSIS — F0390 Unspecified dementia without behavioral disturbance: Secondary | ICD-10-CM | POA: Diagnosis not present

## 2022-02-19 DIAGNOSIS — L89022 Pressure ulcer of left elbow, stage 2: Secondary | ICD-10-CM | POA: Diagnosis not present

## 2022-02-19 DIAGNOSIS — I10 Essential (primary) hypertension: Secondary | ICD-10-CM | POA: Diagnosis not present

## 2022-02-19 DIAGNOSIS — C61 Malignant neoplasm of prostate: Secondary | ICD-10-CM | POA: Diagnosis not present

## 2022-02-19 DIAGNOSIS — F0390 Unspecified dementia without behavioral disturbance: Secondary | ICD-10-CM | POA: Diagnosis not present

## 2022-02-19 DIAGNOSIS — I482 Chronic atrial fibrillation, unspecified: Secondary | ICD-10-CM | POA: Diagnosis not present

## 2022-02-19 DIAGNOSIS — I2510 Atherosclerotic heart disease of native coronary artery without angina pectoris: Secondary | ICD-10-CM | POA: Diagnosis not present

## 2022-02-20 DIAGNOSIS — D508 Other iron deficiency anemias: Secondary | ICD-10-CM | POA: Diagnosis not present

## 2022-02-20 DIAGNOSIS — G8250 Quadriplegia, unspecified: Secondary | ICD-10-CM | POA: Diagnosis not present

## 2022-02-21 DIAGNOSIS — I2510 Atherosclerotic heart disease of native coronary artery without angina pectoris: Secondary | ICD-10-CM | POA: Diagnosis not present

## 2022-02-21 DIAGNOSIS — F0390 Unspecified dementia without behavioral disturbance: Secondary | ICD-10-CM | POA: Diagnosis not present

## 2022-02-21 DIAGNOSIS — I482 Chronic atrial fibrillation, unspecified: Secondary | ICD-10-CM | POA: Diagnosis not present

## 2022-02-21 DIAGNOSIS — I10 Essential (primary) hypertension: Secondary | ICD-10-CM | POA: Diagnosis not present

## 2022-02-21 DIAGNOSIS — C61 Malignant neoplasm of prostate: Secondary | ICD-10-CM | POA: Diagnosis not present

## 2022-02-21 DIAGNOSIS — L89022 Pressure ulcer of left elbow, stage 2: Secondary | ICD-10-CM | POA: Diagnosis not present

## 2022-02-24 DIAGNOSIS — E440 Moderate protein-calorie malnutrition: Secondary | ICD-10-CM | POA: Diagnosis not present

## 2022-02-24 DIAGNOSIS — I482 Chronic atrial fibrillation, unspecified: Secondary | ICD-10-CM | POA: Diagnosis not present

## 2022-02-24 DIAGNOSIS — R339 Retention of urine, unspecified: Secondary | ICD-10-CM | POA: Diagnosis not present

## 2022-02-24 DIAGNOSIS — E875 Hyperkalemia: Secondary | ICD-10-CM | POA: Diagnosis not present

## 2022-02-24 DIAGNOSIS — Z79899 Other long term (current) drug therapy: Secondary | ICD-10-CM | POA: Diagnosis not present

## 2022-02-24 DIAGNOSIS — N319 Neuromuscular dysfunction of bladder, unspecified: Secondary | ICD-10-CM | POA: Diagnosis not present

## 2022-02-24 DIAGNOSIS — Z9181 History of falling: Secondary | ICD-10-CM | POA: Diagnosis not present

## 2022-02-24 DIAGNOSIS — K7689 Other specified diseases of liver: Secondary | ICD-10-CM | POA: Diagnosis not present

## 2022-02-24 DIAGNOSIS — I10 Essential (primary) hypertension: Secondary | ICD-10-CM | POA: Diagnosis not present

## 2022-02-24 DIAGNOSIS — Z87440 Personal history of urinary (tract) infections: Secondary | ICD-10-CM | POA: Diagnosis not present

## 2022-02-24 DIAGNOSIS — L89022 Pressure ulcer of left elbow, stage 2: Secondary | ICD-10-CM | POA: Diagnosis not present

## 2022-02-24 DIAGNOSIS — F0390 Unspecified dementia without behavioral disturbance: Secondary | ICD-10-CM | POA: Diagnosis not present

## 2022-02-24 DIAGNOSIS — C61 Malignant neoplasm of prostate: Secondary | ICD-10-CM | POA: Diagnosis not present

## 2022-02-24 DIAGNOSIS — K862 Cyst of pancreas: Secondary | ICD-10-CM | POA: Diagnosis not present

## 2022-02-24 DIAGNOSIS — D500 Iron deficiency anemia secondary to blood loss (chronic): Secondary | ICD-10-CM | POA: Diagnosis not present

## 2022-02-24 DIAGNOSIS — I2510 Atherosclerotic heart disease of native coronary artery without angina pectoris: Secondary | ICD-10-CM | POA: Diagnosis not present

## 2022-02-24 DIAGNOSIS — S32434D Nondisplaced fracture of anterior column [iliopubic] of right acetabulum, subsequent encounter for fracture with routine healing: Secondary | ICD-10-CM | POA: Diagnosis not present

## 2022-02-24 DIAGNOSIS — R634 Abnormal weight loss: Secondary | ICD-10-CM | POA: Diagnosis not present

## 2022-02-24 DIAGNOSIS — N35014 Post-traumatic urethral stricture, male, unspecified: Secondary | ICD-10-CM | POA: Diagnosis not present

## 2022-02-24 DIAGNOSIS — Z6823 Body mass index (BMI) 23.0-23.9, adult: Secondary | ICD-10-CM | POA: Diagnosis not present

## 2022-02-27 DIAGNOSIS — I482 Chronic atrial fibrillation, unspecified: Secondary | ICD-10-CM | POA: Diagnosis not present

## 2022-02-27 DIAGNOSIS — I2510 Atherosclerotic heart disease of native coronary artery without angina pectoris: Secondary | ICD-10-CM | POA: Diagnosis not present

## 2022-02-27 DIAGNOSIS — I10 Essential (primary) hypertension: Secondary | ICD-10-CM | POA: Diagnosis not present

## 2022-02-27 DIAGNOSIS — F0390 Unspecified dementia without behavioral disturbance: Secondary | ICD-10-CM | POA: Diagnosis not present

## 2022-02-27 DIAGNOSIS — C61 Malignant neoplasm of prostate: Secondary | ICD-10-CM | POA: Diagnosis not present

## 2022-02-27 DIAGNOSIS — L89022 Pressure ulcer of left elbow, stage 2: Secondary | ICD-10-CM | POA: Diagnosis not present

## 2022-02-28 DIAGNOSIS — L89022 Pressure ulcer of left elbow, stage 2: Secondary | ICD-10-CM | POA: Diagnosis not present

## 2022-02-28 DIAGNOSIS — F0390 Unspecified dementia without behavioral disturbance: Secondary | ICD-10-CM | POA: Diagnosis not present

## 2022-02-28 DIAGNOSIS — I2510 Atherosclerotic heart disease of native coronary artery without angina pectoris: Secondary | ICD-10-CM | POA: Diagnosis not present

## 2022-02-28 DIAGNOSIS — C61 Malignant neoplasm of prostate: Secondary | ICD-10-CM | POA: Diagnosis not present

## 2022-02-28 DIAGNOSIS — I10 Essential (primary) hypertension: Secondary | ICD-10-CM | POA: Diagnosis not present

## 2022-02-28 DIAGNOSIS — I482 Chronic atrial fibrillation, unspecified: Secondary | ICD-10-CM | POA: Diagnosis not present

## 2022-03-01 DIAGNOSIS — I10 Essential (primary) hypertension: Secondary | ICD-10-CM | POA: Diagnosis not present

## 2022-03-01 DIAGNOSIS — L89022 Pressure ulcer of left elbow, stage 2: Secondary | ICD-10-CM | POA: Diagnosis not present

## 2022-03-01 DIAGNOSIS — I2510 Atherosclerotic heart disease of native coronary artery without angina pectoris: Secondary | ICD-10-CM | POA: Diagnosis not present

## 2022-03-01 DIAGNOSIS — F0390 Unspecified dementia without behavioral disturbance: Secondary | ICD-10-CM | POA: Diagnosis not present

## 2022-03-01 DIAGNOSIS — I482 Chronic atrial fibrillation, unspecified: Secondary | ICD-10-CM | POA: Diagnosis not present

## 2022-03-01 DIAGNOSIS — C61 Malignant neoplasm of prostate: Secondary | ICD-10-CM | POA: Diagnosis not present

## 2022-03-04 DIAGNOSIS — I10 Essential (primary) hypertension: Secondary | ICD-10-CM | POA: Diagnosis not present

## 2022-03-04 DIAGNOSIS — F0390 Unspecified dementia without behavioral disturbance: Secondary | ICD-10-CM | POA: Diagnosis not present

## 2022-03-04 DIAGNOSIS — K5900 Constipation, unspecified: Secondary | ICD-10-CM | POA: Diagnosis not present

## 2022-03-05 DIAGNOSIS — Z79899 Other long term (current) drug therapy: Secondary | ICD-10-CM | POA: Diagnosis not present

## 2022-03-05 DIAGNOSIS — C61 Malignant neoplasm of prostate: Secondary | ICD-10-CM | POA: Diagnosis not present

## 2022-03-05 DIAGNOSIS — I10 Essential (primary) hypertension: Secondary | ICD-10-CM | POA: Diagnosis not present

## 2022-03-05 DIAGNOSIS — I482 Chronic atrial fibrillation, unspecified: Secondary | ICD-10-CM | POA: Diagnosis not present

## 2022-03-05 DIAGNOSIS — I2510 Atherosclerotic heart disease of native coronary artery without angina pectoris: Secondary | ICD-10-CM | POA: Diagnosis not present

## 2022-03-05 DIAGNOSIS — F0390 Unspecified dementia without behavioral disturbance: Secondary | ICD-10-CM | POA: Diagnosis not present

## 2022-03-05 DIAGNOSIS — L89022 Pressure ulcer of left elbow, stage 2: Secondary | ICD-10-CM | POA: Diagnosis not present

## 2022-03-06 DIAGNOSIS — I2510 Atherosclerotic heart disease of native coronary artery without angina pectoris: Secondary | ICD-10-CM | POA: Diagnosis not present

## 2022-03-06 DIAGNOSIS — I10 Essential (primary) hypertension: Secondary | ICD-10-CM | POA: Diagnosis not present

## 2022-03-06 DIAGNOSIS — C61 Malignant neoplasm of prostate: Secondary | ICD-10-CM | POA: Diagnosis not present

## 2022-03-06 DIAGNOSIS — I482 Chronic atrial fibrillation, unspecified: Secondary | ICD-10-CM | POA: Diagnosis not present

## 2022-03-06 DIAGNOSIS — F0390 Unspecified dementia without behavioral disturbance: Secondary | ICD-10-CM | POA: Diagnosis not present

## 2022-03-06 DIAGNOSIS — L89022 Pressure ulcer of left elbow, stage 2: Secondary | ICD-10-CM | POA: Diagnosis not present

## 2022-03-11 DIAGNOSIS — Z466 Encounter for fitting and adjustment of urinary device: Secondary | ICD-10-CM | POA: Diagnosis not present

## 2022-03-15 DIAGNOSIS — I10 Essential (primary) hypertension: Secondary | ICD-10-CM | POA: Diagnosis not present

## 2022-03-15 DIAGNOSIS — C61 Malignant neoplasm of prostate: Secondary | ICD-10-CM | POA: Diagnosis not present

## 2022-03-15 DIAGNOSIS — L89022 Pressure ulcer of left elbow, stage 2: Secondary | ICD-10-CM | POA: Diagnosis not present

## 2022-03-15 DIAGNOSIS — I482 Chronic atrial fibrillation, unspecified: Secondary | ICD-10-CM | POA: Diagnosis not present

## 2022-03-15 DIAGNOSIS — I2510 Atherosclerotic heart disease of native coronary artery without angina pectoris: Secondary | ICD-10-CM | POA: Diagnosis not present

## 2022-03-15 DIAGNOSIS — F0390 Unspecified dementia without behavioral disturbance: Secondary | ICD-10-CM | POA: Diagnosis not present

## 2022-03-18 DIAGNOSIS — F0390 Unspecified dementia without behavioral disturbance: Secondary | ICD-10-CM | POA: Diagnosis not present

## 2022-03-18 DIAGNOSIS — C61 Malignant neoplasm of prostate: Secondary | ICD-10-CM | POA: Diagnosis not present

## 2022-03-18 DIAGNOSIS — L89022 Pressure ulcer of left elbow, stage 2: Secondary | ICD-10-CM | POA: Diagnosis not present

## 2022-03-18 DIAGNOSIS — I482 Chronic atrial fibrillation, unspecified: Secondary | ICD-10-CM | POA: Diagnosis not present

## 2022-03-18 DIAGNOSIS — I10 Essential (primary) hypertension: Secondary | ICD-10-CM | POA: Diagnosis not present

## 2022-03-18 DIAGNOSIS — I2510 Atherosclerotic heart disease of native coronary artery without angina pectoris: Secondary | ICD-10-CM | POA: Diagnosis not present

## 2022-03-21 DIAGNOSIS — I10 Essential (primary) hypertension: Secondary | ICD-10-CM | POA: Diagnosis not present

## 2022-03-21 DIAGNOSIS — F0390 Unspecified dementia without behavioral disturbance: Secondary | ICD-10-CM | POA: Diagnosis not present

## 2022-03-21 DIAGNOSIS — I2510 Atherosclerotic heart disease of native coronary artery without angina pectoris: Secondary | ICD-10-CM | POA: Diagnosis not present

## 2022-03-21 DIAGNOSIS — L89022 Pressure ulcer of left elbow, stage 2: Secondary | ICD-10-CM | POA: Diagnosis not present

## 2022-03-21 DIAGNOSIS — I482 Chronic atrial fibrillation, unspecified: Secondary | ICD-10-CM | POA: Diagnosis not present

## 2022-03-21 DIAGNOSIS — C61 Malignant neoplasm of prostate: Secondary | ICD-10-CM | POA: Diagnosis not present

## 2022-03-25 DIAGNOSIS — D508 Other iron deficiency anemias: Secondary | ICD-10-CM | POA: Diagnosis not present

## 2022-03-25 DIAGNOSIS — I482 Chronic atrial fibrillation, unspecified: Secondary | ICD-10-CM | POA: Diagnosis not present

## 2022-03-25 DIAGNOSIS — F0390 Unspecified dementia without behavioral disturbance: Secondary | ICD-10-CM | POA: Diagnosis not present

## 2022-03-25 DIAGNOSIS — G301 Alzheimer's disease with late onset: Secondary | ICD-10-CM | POA: Diagnosis not present

## 2022-03-25 DIAGNOSIS — C61 Malignant neoplasm of prostate: Secondary | ICD-10-CM | POA: Diagnosis not present

## 2022-03-26 DIAGNOSIS — Z9181 History of falling: Secondary | ICD-10-CM | POA: Diagnosis not present

## 2022-03-26 DIAGNOSIS — Z87440 Personal history of urinary (tract) infections: Secondary | ICD-10-CM | POA: Diagnosis not present

## 2022-03-26 DIAGNOSIS — L89022 Pressure ulcer of left elbow, stage 2: Secondary | ICD-10-CM | POA: Diagnosis not present

## 2022-03-26 DIAGNOSIS — I482 Chronic atrial fibrillation, unspecified: Secondary | ICD-10-CM | POA: Diagnosis not present

## 2022-03-26 DIAGNOSIS — D500 Iron deficiency anemia secondary to blood loss (chronic): Secondary | ICD-10-CM | POA: Diagnosis not present

## 2022-03-26 DIAGNOSIS — E875 Hyperkalemia: Secondary | ICD-10-CM | POA: Diagnosis not present

## 2022-03-26 DIAGNOSIS — C61 Malignant neoplasm of prostate: Secondary | ICD-10-CM | POA: Diagnosis not present

## 2022-03-26 DIAGNOSIS — Z79899 Other long term (current) drug therapy: Secondary | ICD-10-CM | POA: Diagnosis not present

## 2022-03-26 DIAGNOSIS — R339 Retention of urine, unspecified: Secondary | ICD-10-CM | POA: Diagnosis not present

## 2022-03-26 DIAGNOSIS — K862 Cyst of pancreas: Secondary | ICD-10-CM | POA: Diagnosis not present

## 2022-03-26 DIAGNOSIS — R634 Abnormal weight loss: Secondary | ICD-10-CM | POA: Diagnosis not present

## 2022-03-26 DIAGNOSIS — F0390 Unspecified dementia without behavioral disturbance: Secondary | ICD-10-CM | POA: Diagnosis not present

## 2022-03-26 DIAGNOSIS — E440 Moderate protein-calorie malnutrition: Secondary | ICD-10-CM | POA: Diagnosis not present

## 2022-03-26 DIAGNOSIS — Z6823 Body mass index (BMI) 23.0-23.9, adult: Secondary | ICD-10-CM | POA: Diagnosis not present

## 2022-03-26 DIAGNOSIS — N35014 Post-traumatic urethral stricture, male, unspecified: Secondary | ICD-10-CM | POA: Diagnosis not present

## 2022-03-26 DIAGNOSIS — I10 Essential (primary) hypertension: Secondary | ICD-10-CM | POA: Diagnosis not present

## 2022-03-26 DIAGNOSIS — I2510 Atherosclerotic heart disease of native coronary artery without angina pectoris: Secondary | ICD-10-CM | POA: Diagnosis not present

## 2022-03-26 DIAGNOSIS — S32434D Nondisplaced fracture of anterior column [iliopubic] of right acetabulum, subsequent encounter for fracture with routine healing: Secondary | ICD-10-CM | POA: Diagnosis not present

## 2022-03-26 DIAGNOSIS — N319 Neuromuscular dysfunction of bladder, unspecified: Secondary | ICD-10-CM | POA: Diagnosis not present

## 2022-03-26 DIAGNOSIS — K7689 Other specified diseases of liver: Secondary | ICD-10-CM | POA: Diagnosis not present

## 2022-03-28 DIAGNOSIS — L905 Scar conditions and fibrosis of skin: Secondary | ICD-10-CM | POA: Diagnosis not present

## 2022-03-28 DIAGNOSIS — D040 Carcinoma in situ of skin of lip: Secondary | ICD-10-CM | POA: Diagnosis not present

## 2022-03-28 DIAGNOSIS — D485 Neoplasm of uncertain behavior of skin: Secondary | ICD-10-CM | POA: Diagnosis not present

## 2022-03-28 DIAGNOSIS — L570 Actinic keratosis: Secondary | ICD-10-CM | POA: Diagnosis not present

## 2022-03-28 DIAGNOSIS — D0462 Carcinoma in situ of skin of left upper limb, including shoulder: Secondary | ICD-10-CM | POA: Diagnosis not present

## 2022-03-28 DIAGNOSIS — Z85828 Personal history of other malignant neoplasm of skin: Secondary | ICD-10-CM | POA: Diagnosis not present

## 2022-03-28 DIAGNOSIS — D0439 Carcinoma in situ of skin of other parts of face: Secondary | ICD-10-CM | POA: Diagnosis not present

## 2022-04-04 DIAGNOSIS — L89022 Pressure ulcer of left elbow, stage 2: Secondary | ICD-10-CM | POA: Diagnosis not present

## 2022-04-04 DIAGNOSIS — I2510 Atherosclerotic heart disease of native coronary artery without angina pectoris: Secondary | ICD-10-CM | POA: Diagnosis not present

## 2022-04-04 DIAGNOSIS — C61 Malignant neoplasm of prostate: Secondary | ICD-10-CM | POA: Diagnosis not present

## 2022-04-04 DIAGNOSIS — F0390 Unspecified dementia without behavioral disturbance: Secondary | ICD-10-CM | POA: Diagnosis not present

## 2022-04-04 DIAGNOSIS — I482 Chronic atrial fibrillation, unspecified: Secondary | ICD-10-CM | POA: Diagnosis not present

## 2022-04-04 DIAGNOSIS — I10 Essential (primary) hypertension: Secondary | ICD-10-CM | POA: Diagnosis not present

## 2022-04-08 DIAGNOSIS — I482 Chronic atrial fibrillation, unspecified: Secondary | ICD-10-CM | POA: Diagnosis not present

## 2022-04-08 DIAGNOSIS — F5104 Psychophysiologic insomnia: Secondary | ICD-10-CM | POA: Diagnosis not present

## 2022-04-08 DIAGNOSIS — Z66 Do not resuscitate: Secondary | ICD-10-CM | POA: Diagnosis not present

## 2022-04-08 DIAGNOSIS — F0390 Unspecified dementia without behavioral disturbance: Secondary | ICD-10-CM | POA: Diagnosis not present

## 2022-04-09 DIAGNOSIS — R410 Disorientation, unspecified: Secondary | ICD-10-CM | POA: Diagnosis not present

## 2022-04-11 DIAGNOSIS — I10 Essential (primary) hypertension: Secondary | ICD-10-CM | POA: Diagnosis not present

## 2022-04-11 DIAGNOSIS — I2510 Atherosclerotic heart disease of native coronary artery without angina pectoris: Secondary | ICD-10-CM | POA: Diagnosis not present

## 2022-04-11 DIAGNOSIS — F0390 Unspecified dementia without behavioral disturbance: Secondary | ICD-10-CM | POA: Diagnosis not present

## 2022-04-11 DIAGNOSIS — C61 Malignant neoplasm of prostate: Secondary | ICD-10-CM | POA: Diagnosis not present

## 2022-04-11 DIAGNOSIS — L89022 Pressure ulcer of left elbow, stage 2: Secondary | ICD-10-CM | POA: Diagnosis not present

## 2022-04-11 DIAGNOSIS — I482 Chronic atrial fibrillation, unspecified: Secondary | ICD-10-CM | POA: Diagnosis not present

## 2022-04-15 DIAGNOSIS — N312 Flaccid neuropathic bladder, not elsewhere classified: Secondary | ICD-10-CM | POA: Diagnosis not present

## 2022-04-15 DIAGNOSIS — I482 Chronic atrial fibrillation, unspecified: Secondary | ICD-10-CM | POA: Diagnosis not present

## 2022-04-15 DIAGNOSIS — F0393 Unspecified dementia, unspecified severity, with mood disturbance: Secondary | ICD-10-CM | POA: Diagnosis not present

## 2022-04-17 DIAGNOSIS — I2510 Atherosclerotic heart disease of native coronary artery without angina pectoris: Secondary | ICD-10-CM | POA: Diagnosis not present

## 2022-04-17 DIAGNOSIS — I482 Chronic atrial fibrillation, unspecified: Secondary | ICD-10-CM | POA: Diagnosis not present

## 2022-04-17 DIAGNOSIS — L89022 Pressure ulcer of left elbow, stage 2: Secondary | ICD-10-CM | POA: Diagnosis not present

## 2022-04-17 DIAGNOSIS — C61 Malignant neoplasm of prostate: Secondary | ICD-10-CM | POA: Diagnosis not present

## 2022-04-17 DIAGNOSIS — D508 Other iron deficiency anemias: Secondary | ICD-10-CM | POA: Diagnosis not present

## 2022-04-17 DIAGNOSIS — F0280 Dementia in other diseases classified elsewhere without behavioral disturbance: Secondary | ICD-10-CM | POA: Diagnosis not present

## 2022-04-17 DIAGNOSIS — G301 Alzheimer's disease with late onset: Secondary | ICD-10-CM | POA: Diagnosis not present

## 2022-04-17 DIAGNOSIS — I10 Essential (primary) hypertension: Secondary | ICD-10-CM | POA: Diagnosis not present

## 2022-04-17 DIAGNOSIS — F0390 Unspecified dementia without behavioral disturbance: Secondary | ICD-10-CM | POA: Diagnosis not present

## 2022-04-18 DIAGNOSIS — C61 Malignant neoplasm of prostate: Secondary | ICD-10-CM | POA: Diagnosis not present

## 2022-04-18 DIAGNOSIS — G301 Alzheimer's disease with late onset: Secondary | ICD-10-CM | POA: Diagnosis not present

## 2022-04-22 DIAGNOSIS — F0390 Unspecified dementia without behavioral disturbance: Secondary | ICD-10-CM | POA: Diagnosis not present

## 2022-04-22 DIAGNOSIS — C61 Malignant neoplasm of prostate: Secondary | ICD-10-CM | POA: Diagnosis not present

## 2022-04-22 DIAGNOSIS — I10 Essential (primary) hypertension: Secondary | ICD-10-CM | POA: Diagnosis not present

## 2022-04-22 DIAGNOSIS — I2510 Atherosclerotic heart disease of native coronary artery without angina pectoris: Secondary | ICD-10-CM | POA: Diagnosis not present

## 2022-04-22 DIAGNOSIS — I482 Chronic atrial fibrillation, unspecified: Secondary | ICD-10-CM | POA: Diagnosis not present

## 2022-04-22 DIAGNOSIS — L89022 Pressure ulcer of left elbow, stage 2: Secondary | ICD-10-CM | POA: Diagnosis not present

## 2022-04-23 DIAGNOSIS — I2510 Atherosclerotic heart disease of native coronary artery without angina pectoris: Secondary | ICD-10-CM | POA: Diagnosis not present

## 2022-04-23 DIAGNOSIS — C61 Malignant neoplasm of prostate: Secondary | ICD-10-CM | POA: Diagnosis not present

## 2022-04-23 DIAGNOSIS — F0390 Unspecified dementia without behavioral disturbance: Secondary | ICD-10-CM | POA: Diagnosis not present

## 2022-04-23 DIAGNOSIS — I482 Chronic atrial fibrillation, unspecified: Secondary | ICD-10-CM | POA: Diagnosis not present

## 2022-04-23 DIAGNOSIS — I10 Essential (primary) hypertension: Secondary | ICD-10-CM | POA: Diagnosis not present

## 2022-04-23 DIAGNOSIS — L89022 Pressure ulcer of left elbow, stage 2: Secondary | ICD-10-CM | POA: Diagnosis not present

## 2022-04-25 DIAGNOSIS — E440 Moderate protein-calorie malnutrition: Secondary | ICD-10-CM | POA: Diagnosis not present

## 2022-04-25 DIAGNOSIS — S32434D Nondisplaced fracture of anterior column [iliopubic] of right acetabulum, subsequent encounter for fracture with routine healing: Secondary | ICD-10-CM | POA: Diagnosis not present

## 2022-04-25 DIAGNOSIS — Z85828 Personal history of other malignant neoplasm of skin: Secondary | ICD-10-CM | POA: Diagnosis not present

## 2022-04-25 DIAGNOSIS — N35014 Post-traumatic urethral stricture, male, unspecified: Secondary | ICD-10-CM | POA: Diagnosis not present

## 2022-04-25 DIAGNOSIS — Z87440 Personal history of urinary (tract) infections: Secondary | ICD-10-CM | POA: Diagnosis not present

## 2022-04-25 DIAGNOSIS — Z6823 Body mass index (BMI) 23.0-23.9, adult: Secondary | ICD-10-CM | POA: Diagnosis not present

## 2022-04-25 DIAGNOSIS — R339 Retention of urine, unspecified: Secondary | ICD-10-CM | POA: Diagnosis not present

## 2022-04-25 DIAGNOSIS — C61 Malignant neoplasm of prostate: Secondary | ICD-10-CM | POA: Diagnosis not present

## 2022-04-25 DIAGNOSIS — F0390 Unspecified dementia without behavioral disturbance: Secondary | ICD-10-CM | POA: Diagnosis not present

## 2022-04-25 DIAGNOSIS — I2510 Atherosclerotic heart disease of native coronary artery without angina pectoris: Secondary | ICD-10-CM | POA: Diagnosis not present

## 2022-04-25 DIAGNOSIS — I10 Essential (primary) hypertension: Secondary | ICD-10-CM | POA: Diagnosis not present

## 2022-04-25 DIAGNOSIS — K862 Cyst of pancreas: Secondary | ICD-10-CM | POA: Diagnosis not present

## 2022-04-25 DIAGNOSIS — R634 Abnormal weight loss: Secondary | ICD-10-CM | POA: Diagnosis not present

## 2022-04-25 DIAGNOSIS — E875 Hyperkalemia: Secondary | ICD-10-CM | POA: Diagnosis not present

## 2022-04-25 DIAGNOSIS — Z79899 Other long term (current) drug therapy: Secondary | ICD-10-CM | POA: Diagnosis not present

## 2022-04-25 DIAGNOSIS — I482 Chronic atrial fibrillation, unspecified: Secondary | ICD-10-CM | POA: Diagnosis not present

## 2022-04-25 DIAGNOSIS — Z9181 History of falling: Secondary | ICD-10-CM | POA: Diagnosis not present

## 2022-04-25 DIAGNOSIS — K7689 Other specified diseases of liver: Secondary | ICD-10-CM | POA: Diagnosis not present

## 2022-04-25 DIAGNOSIS — L570 Actinic keratosis: Secondary | ICD-10-CM | POA: Diagnosis not present

## 2022-04-25 DIAGNOSIS — N319 Neuromuscular dysfunction of bladder, unspecified: Secondary | ICD-10-CM | POA: Diagnosis not present

## 2022-04-25 DIAGNOSIS — L89022 Pressure ulcer of left elbow, stage 2: Secondary | ICD-10-CM | POA: Diagnosis not present

## 2022-04-25 DIAGNOSIS — D500 Iron deficiency anemia secondary to blood loss (chronic): Secondary | ICD-10-CM | POA: Diagnosis not present

## 2022-04-29 DIAGNOSIS — I10 Essential (primary) hypertension: Secondary | ICD-10-CM | POA: Diagnosis not present

## 2022-04-29 DIAGNOSIS — F0393 Unspecified dementia, unspecified severity, with mood disturbance: Secondary | ICD-10-CM | POA: Diagnosis not present

## 2022-04-29 DIAGNOSIS — F5104 Psychophysiologic insomnia: Secondary | ICD-10-CM | POA: Diagnosis not present

## 2022-05-03 DIAGNOSIS — I482 Chronic atrial fibrillation, unspecified: Secondary | ICD-10-CM | POA: Diagnosis not present

## 2022-05-03 DIAGNOSIS — I10 Essential (primary) hypertension: Secondary | ICD-10-CM | POA: Diagnosis not present

## 2022-05-03 DIAGNOSIS — I2510 Atherosclerotic heart disease of native coronary artery without angina pectoris: Secondary | ICD-10-CM | POA: Diagnosis not present

## 2022-05-03 DIAGNOSIS — C61 Malignant neoplasm of prostate: Secondary | ICD-10-CM | POA: Diagnosis not present

## 2022-05-03 DIAGNOSIS — F0390 Unspecified dementia without behavioral disturbance: Secondary | ICD-10-CM | POA: Diagnosis not present

## 2022-05-03 DIAGNOSIS — L89022 Pressure ulcer of left elbow, stage 2: Secondary | ICD-10-CM | POA: Diagnosis not present

## 2022-05-06 DIAGNOSIS — F5104 Psychophysiologic insomnia: Secondary | ICD-10-CM | POA: Diagnosis not present

## 2022-05-06 DIAGNOSIS — I10 Essential (primary) hypertension: Secondary | ICD-10-CM | POA: Diagnosis not present

## 2022-05-06 DIAGNOSIS — F0393 Unspecified dementia, unspecified severity, with mood disturbance: Secondary | ICD-10-CM | POA: Diagnosis not present

## 2022-05-09 DIAGNOSIS — F0390 Unspecified dementia without behavioral disturbance: Secondary | ICD-10-CM | POA: Diagnosis not present

## 2022-05-09 DIAGNOSIS — I2510 Atherosclerotic heart disease of native coronary artery without angina pectoris: Secondary | ICD-10-CM | POA: Diagnosis not present

## 2022-05-09 DIAGNOSIS — L89022 Pressure ulcer of left elbow, stage 2: Secondary | ICD-10-CM | POA: Diagnosis not present

## 2022-05-09 DIAGNOSIS — I10 Essential (primary) hypertension: Secondary | ICD-10-CM | POA: Diagnosis not present

## 2022-05-09 DIAGNOSIS — I482 Chronic atrial fibrillation, unspecified: Secondary | ICD-10-CM | POA: Diagnosis not present

## 2022-05-09 DIAGNOSIS — C61 Malignant neoplasm of prostate: Secondary | ICD-10-CM | POA: Diagnosis not present

## 2022-05-13 DIAGNOSIS — I10 Essential (primary) hypertension: Secondary | ICD-10-CM | POA: Diagnosis not present

## 2022-05-13 DIAGNOSIS — F0393 Unspecified dementia, unspecified severity, with mood disturbance: Secondary | ICD-10-CM | POA: Diagnosis not present

## 2022-05-13 DIAGNOSIS — I482 Chronic atrial fibrillation, unspecified: Secondary | ICD-10-CM | POA: Diagnosis not present

## 2022-05-13 DIAGNOSIS — L570 Actinic keratosis: Secondary | ICD-10-CM | POA: Diagnosis not present

## 2022-05-15 DIAGNOSIS — I482 Chronic atrial fibrillation, unspecified: Secondary | ICD-10-CM | POA: Diagnosis not present

## 2022-05-15 DIAGNOSIS — L89022 Pressure ulcer of left elbow, stage 2: Secondary | ICD-10-CM | POA: Diagnosis not present

## 2022-05-15 DIAGNOSIS — C61 Malignant neoplasm of prostate: Secondary | ICD-10-CM | POA: Diagnosis not present

## 2022-05-15 DIAGNOSIS — I2510 Atherosclerotic heart disease of native coronary artery without angina pectoris: Secondary | ICD-10-CM | POA: Diagnosis not present

## 2022-05-15 DIAGNOSIS — F0390 Unspecified dementia without behavioral disturbance: Secondary | ICD-10-CM | POA: Diagnosis not present

## 2022-05-15 DIAGNOSIS — I10 Essential (primary) hypertension: Secondary | ICD-10-CM | POA: Diagnosis not present

## 2022-05-20 DIAGNOSIS — I10 Essential (primary) hypertension: Secondary | ICD-10-CM | POA: Diagnosis not present

## 2022-05-20 DIAGNOSIS — I482 Chronic atrial fibrillation, unspecified: Secondary | ICD-10-CM | POA: Diagnosis not present

## 2022-05-20 DIAGNOSIS — F0393 Unspecified dementia, unspecified severity, with mood disturbance: Secondary | ICD-10-CM | POA: Diagnosis not present

## 2022-05-21 DIAGNOSIS — C61 Malignant neoplasm of prostate: Secondary | ICD-10-CM | POA: Diagnosis not present

## 2022-05-21 DIAGNOSIS — F0390 Unspecified dementia without behavioral disturbance: Secondary | ICD-10-CM | POA: Diagnosis not present

## 2022-05-21 DIAGNOSIS — L89022 Pressure ulcer of left elbow, stage 2: Secondary | ICD-10-CM | POA: Diagnosis not present

## 2022-05-21 DIAGNOSIS — I482 Chronic atrial fibrillation, unspecified: Secondary | ICD-10-CM | POA: Diagnosis not present

## 2022-05-21 DIAGNOSIS — I10 Essential (primary) hypertension: Secondary | ICD-10-CM | POA: Diagnosis not present

## 2022-05-21 DIAGNOSIS — I2510 Atherosclerotic heart disease of native coronary artery without angina pectoris: Secondary | ICD-10-CM | POA: Diagnosis not present

## 2022-05-23 DIAGNOSIS — L89022 Pressure ulcer of left elbow, stage 2: Secondary | ICD-10-CM | POA: Diagnosis not present

## 2022-05-23 DIAGNOSIS — I482 Chronic atrial fibrillation, unspecified: Secondary | ICD-10-CM | POA: Diagnosis not present

## 2022-05-23 DIAGNOSIS — F0390 Unspecified dementia without behavioral disturbance: Secondary | ICD-10-CM | POA: Diagnosis not present

## 2022-05-23 DIAGNOSIS — I2510 Atherosclerotic heart disease of native coronary artery without angina pectoris: Secondary | ICD-10-CM | POA: Diagnosis not present

## 2022-05-23 DIAGNOSIS — I10 Essential (primary) hypertension: Secondary | ICD-10-CM | POA: Diagnosis not present

## 2022-05-23 DIAGNOSIS — C61 Malignant neoplasm of prostate: Secondary | ICD-10-CM | POA: Diagnosis not present

## 2022-05-28 DIAGNOSIS — I119 Hypertensive heart disease without heart failure: Secondary | ICD-10-CM | POA: Diagnosis not present

## 2022-05-28 DIAGNOSIS — N401 Enlarged prostate with lower urinary tract symptoms: Secondary | ICD-10-CM | POA: Diagnosis not present

## 2022-05-29 DIAGNOSIS — F02818 Dementia in other diseases classified elsewhere, unspecified severity, with other behavioral disturbance: Secondary | ICD-10-CM | POA: Diagnosis not present

## 2022-05-29 DIAGNOSIS — Z993 Dependence on wheelchair: Secondary | ICD-10-CM | POA: Diagnosis not present

## 2022-05-29 DIAGNOSIS — Z87440 Personal history of urinary (tract) infections: Secondary | ICD-10-CM | POA: Diagnosis not present

## 2022-05-29 DIAGNOSIS — F0390 Unspecified dementia without behavioral disturbance: Secondary | ICD-10-CM | POA: Diagnosis not present

## 2022-05-29 DIAGNOSIS — D649 Anemia, unspecified: Secondary | ICD-10-CM | POA: Diagnosis not present

## 2022-05-29 DIAGNOSIS — Z681 Body mass index (BMI) 19 or less, adult: Secondary | ICD-10-CM | POA: Diagnosis not present

## 2022-05-29 DIAGNOSIS — Z466 Encounter for fitting and adjustment of urinary device: Secondary | ICD-10-CM | POA: Diagnosis not present

## 2022-05-29 DIAGNOSIS — C61 Malignant neoplasm of prostate: Secondary | ICD-10-CM | POA: Diagnosis not present

## 2022-05-29 DIAGNOSIS — W010XXA Fall on same level from slipping, tripping and stumbling without subsequent striking against object, initial encounter: Secondary | ICD-10-CM | POA: Diagnosis not present

## 2022-05-29 DIAGNOSIS — D508 Other iron deficiency anemias: Secondary | ICD-10-CM | POA: Diagnosis not present

## 2022-05-29 DIAGNOSIS — N401 Enlarged prostate with lower urinary tract symptoms: Secondary | ICD-10-CM | POA: Diagnosis not present

## 2022-05-29 DIAGNOSIS — I2510 Atherosclerotic heart disease of native coronary artery without angina pectoris: Secondary | ICD-10-CM | POA: Diagnosis not present

## 2022-05-29 DIAGNOSIS — S32411D Displaced fracture of anterior wall of right acetabulum, subsequent encounter for fracture with routine healing: Secondary | ICD-10-CM | POA: Diagnosis not present

## 2022-05-29 DIAGNOSIS — L089 Local infection of the skin and subcutaneous tissue, unspecified: Secondary | ICD-10-CM | POA: Diagnosis not present

## 2022-05-29 DIAGNOSIS — Z792 Long term (current) use of antibiotics: Secondary | ICD-10-CM | POA: Diagnosis not present

## 2022-05-29 DIAGNOSIS — I119 Hypertensive heart disease without heart failure: Secondary | ICD-10-CM | POA: Diagnosis not present

## 2022-05-29 DIAGNOSIS — I482 Chronic atrial fibrillation, unspecified: Secondary | ICD-10-CM | POA: Diagnosis not present

## 2022-05-29 DIAGNOSIS — E440 Moderate protein-calorie malnutrition: Secondary | ICD-10-CM | POA: Diagnosis not present

## 2022-05-29 DIAGNOSIS — Z79891 Long term (current) use of opiate analgesic: Secondary | ICD-10-CM | POA: Diagnosis not present

## 2022-05-29 DIAGNOSIS — R338 Other retention of urine: Secondary | ICD-10-CM | POA: Diagnosis not present

## 2022-05-29 DIAGNOSIS — I10 Essential (primary) hypertension: Secondary | ICD-10-CM | POA: Diagnosis not present

## 2022-05-29 DIAGNOSIS — G301 Alzheimer's disease with late onset: Secondary | ICD-10-CM | POA: Diagnosis not present

## 2022-06-05 DIAGNOSIS — I10 Essential (primary) hypertension: Secondary | ICD-10-CM | POA: Diagnosis not present

## 2022-06-05 DIAGNOSIS — Z85828 Personal history of other malignant neoplasm of skin: Secondary | ICD-10-CM | POA: Diagnosis not present

## 2022-06-05 DIAGNOSIS — N401 Enlarged prostate with lower urinary tract symptoms: Secondary | ICD-10-CM | POA: Diagnosis not present

## 2022-06-05 DIAGNOSIS — D0462 Carcinoma in situ of skin of left upper limb, including shoulder: Secondary | ICD-10-CM | POA: Diagnosis not present

## 2022-06-05 DIAGNOSIS — D485 Neoplasm of uncertain behavior of skin: Secondary | ICD-10-CM | POA: Diagnosis not present

## 2022-06-05 DIAGNOSIS — I2510 Atherosclerotic heart disease of native coronary artery without angina pectoris: Secondary | ICD-10-CM | POA: Diagnosis not present

## 2022-06-05 DIAGNOSIS — Z466 Encounter for fitting and adjustment of urinary device: Secondary | ICD-10-CM | POA: Diagnosis not present

## 2022-06-05 DIAGNOSIS — I482 Chronic atrial fibrillation, unspecified: Secondary | ICD-10-CM | POA: Diagnosis not present

## 2022-06-05 DIAGNOSIS — R338 Other retention of urine: Secondary | ICD-10-CM | POA: Diagnosis not present

## 2022-06-10 DIAGNOSIS — I482 Chronic atrial fibrillation, unspecified: Secondary | ICD-10-CM | POA: Diagnosis not present

## 2022-06-10 DIAGNOSIS — N312 Flaccid neuropathic bladder, not elsewhere classified: Secondary | ICD-10-CM | POA: Diagnosis not present

## 2022-06-10 DIAGNOSIS — I119 Hypertensive heart disease without heart failure: Secondary | ICD-10-CM | POA: Diagnosis not present

## 2022-06-11 DIAGNOSIS — R338 Other retention of urine: Secondary | ICD-10-CM | POA: Diagnosis not present

## 2022-06-11 DIAGNOSIS — I10 Essential (primary) hypertension: Secondary | ICD-10-CM | POA: Diagnosis not present

## 2022-06-11 DIAGNOSIS — I2510 Atherosclerotic heart disease of native coronary artery without angina pectoris: Secondary | ICD-10-CM | POA: Diagnosis not present

## 2022-06-11 DIAGNOSIS — I482 Chronic atrial fibrillation, unspecified: Secondary | ICD-10-CM | POA: Diagnosis not present

## 2022-06-11 DIAGNOSIS — N401 Enlarged prostate with lower urinary tract symptoms: Secondary | ICD-10-CM | POA: Diagnosis not present

## 2022-06-11 DIAGNOSIS — Z466 Encounter for fitting and adjustment of urinary device: Secondary | ICD-10-CM | POA: Diagnosis not present

## 2022-06-13 ENCOUNTER — Emergency Department (HOSPITAL_COMMUNITY)
Admission: EM | Admit: 2022-06-13 | Discharge: 2022-06-13 | Disposition: A | Discharge status: Home / Self Care | Payer: Medicare Other | Attending: Student | Admitting: Student

## 2022-06-13 ENCOUNTER — Other Ambulatory Visit: Payer: Self-pay

## 2022-06-13 ENCOUNTER — Emergency Department (HOSPITAL_COMMUNITY): Payer: Medicare Other

## 2022-06-13 ENCOUNTER — Encounter (HOSPITAL_COMMUNITY): Payer: Self-pay

## 2022-06-13 DIAGNOSIS — M25522 Pain in left elbow: Secondary | ICD-10-CM | POA: Diagnosis present

## 2022-06-13 DIAGNOSIS — W010XXA Fall on same level from slipping, tripping and stumbling without subsequent striking against object, initial encounter: Secondary | ICD-10-CM | POA: Insufficient documentation

## 2022-06-13 DIAGNOSIS — I482 Chronic atrial fibrillation, unspecified: Secondary | ICD-10-CM | POA: Diagnosis not present

## 2022-06-13 DIAGNOSIS — S0081XA Abrasion of other part of head, initial encounter: Secondary | ICD-10-CM | POA: Diagnosis not present

## 2022-06-13 DIAGNOSIS — Z8546 Personal history of malignant neoplasm of prostate: Secondary | ICD-10-CM | POA: Diagnosis not present

## 2022-06-13 DIAGNOSIS — Z7401 Bed confinement status: Secondary | ICD-10-CM | POA: Diagnosis not present

## 2022-06-13 DIAGNOSIS — I10 Essential (primary) hypertension: Secondary | ICD-10-CM | POA: Diagnosis not present

## 2022-06-13 DIAGNOSIS — J90 Pleural effusion, not elsewhere classified: Secondary | ICD-10-CM | POA: Diagnosis not present

## 2022-06-13 DIAGNOSIS — M4312 Spondylolisthesis, cervical region: Secondary | ICD-10-CM | POA: Diagnosis not present

## 2022-06-13 DIAGNOSIS — Z466 Encounter for fitting and adjustment of urinary device: Secondary | ICD-10-CM | POA: Diagnosis not present

## 2022-06-13 DIAGNOSIS — Z85830 Personal history of malignant neoplasm of bone: Secondary | ICD-10-CM | POA: Diagnosis not present

## 2022-06-13 DIAGNOSIS — R9082 White matter disease, unspecified: Secondary | ICD-10-CM | POA: Diagnosis not present

## 2022-06-13 DIAGNOSIS — S0990XA Unspecified injury of head, initial encounter: Secondary | ICD-10-CM | POA: Diagnosis not present

## 2022-06-13 DIAGNOSIS — N401 Enlarged prostate with lower urinary tract symptoms: Secondary | ICD-10-CM | POA: Diagnosis not present

## 2022-06-13 DIAGNOSIS — R338 Other retention of urine: Secondary | ICD-10-CM | POA: Diagnosis not present

## 2022-06-13 DIAGNOSIS — W19XXXA Unspecified fall, initial encounter: Secondary | ICD-10-CM

## 2022-06-13 DIAGNOSIS — Z7901 Long term (current) use of anticoagulants: Secondary | ICD-10-CM | POA: Insufficient documentation

## 2022-06-13 DIAGNOSIS — S0003XA Contusion of scalp, initial encounter: Secondary | ICD-10-CM | POA: Diagnosis not present

## 2022-06-13 DIAGNOSIS — M7989 Other specified soft tissue disorders: Secondary | ICD-10-CM | POA: Diagnosis not present

## 2022-06-13 DIAGNOSIS — I2510 Atherosclerotic heart disease of native coronary artery without angina pectoris: Secondary | ICD-10-CM | POA: Diagnosis not present

## 2022-06-13 LAB — CBC WITH DIFFERENTIAL/PLATELET
Abs Immature Granulocytes: 0.05 10*3/uL (ref 0.00–0.07)
Basophils Absolute: 0 10*3/uL (ref 0.0–0.1)
Basophils Relative: 0 %
Eosinophils Absolute: 0 10*3/uL (ref 0.0–0.5)
Eosinophils Relative: 0 %
HCT: 26.9 % — ABNORMAL LOW (ref 39.0–52.0)
Hemoglobin: 8.4 g/dL — ABNORMAL LOW (ref 13.0–17.0)
Immature Granulocytes: 1 %
Lymphocytes Relative: 8 %
Lymphs Abs: 0.6 10*3/uL — ABNORMAL LOW (ref 0.7–4.0)
MCH: 28.4 pg (ref 26.0–34.0)
MCHC: 31.2 g/dL (ref 30.0–36.0)
MCV: 90.9 fL (ref 80.0–100.0)
Monocytes Absolute: 0.6 10*3/uL (ref 0.1–1.0)
Monocytes Relative: 9 %
Neutro Abs: 5.5 10*3/uL (ref 1.7–7.7)
Neutrophils Relative %: 82 %
Platelets: 174 10*3/uL (ref 150–400)
RBC: 2.96 MIL/uL — ABNORMAL LOW (ref 4.22–5.81)
RDW: 17.3 % — ABNORMAL HIGH (ref 11.5–15.5)
WBC: 6.8 10*3/uL (ref 4.0–10.5)
nRBC: 0 % (ref 0.0–0.2)

## 2022-06-13 LAB — COMPREHENSIVE METABOLIC PANEL
ALT: 18 U/L (ref 0–44)
AST: 25 U/L (ref 15–41)
Albumin: 2.4 g/dL — ABNORMAL LOW (ref 3.5–5.0)
Alkaline Phosphatase: 269 U/L — ABNORMAL HIGH (ref 38–126)
Anion gap: 9 (ref 5–15)
BUN: 22 mg/dL (ref 8–23)
CO2: 21 mmol/L — ABNORMAL LOW (ref 22–32)
Calcium: 8.5 mg/dL — ABNORMAL LOW (ref 8.9–10.3)
Chloride: 107 mmol/L (ref 98–111)
Creatinine, Ser: 1.22 mg/dL (ref 0.61–1.24)
GFR, Estimated: 56 mL/min — ABNORMAL LOW (ref >60)
Glucose, Bld: 98 mg/dL (ref 70–99)
Potassium: 4 mmol/L (ref 3.5–5.1)
Sodium: 137 mmol/L (ref 135–145)
Total Bilirubin: 0.5 mg/dL (ref 0.3–1.2)
Total Protein: 6.3 g/dL — ABNORMAL LOW (ref 6.5–8.1)

## 2022-06-13 NOTE — ED Notes (Signed)
Patient transported to CT 

## 2022-06-13 NOTE — ED Notes (Signed)
Notified CT of patient's trauma level and orders. Multiple patients in CT at this time. Instructed to call back in 10 minutes by CT staff.

## 2022-06-13 NOTE — ED Notes (Signed)
Patient returned from CT

## 2022-06-13 NOTE — Progress Notes (Signed)
Orthopedic Tech Progress Note Patient Details:  Jacob Becker 17-Oct-1929 [id number]  Level 2 trauma  Patient ID: Jacob Becker, male   DOB: 1929-09-06, 89 y.o.   MRN: [id number]  Jacob Becker 06/13/2022, 5:23 AM

## 2022-06-13 NOTE — ED Provider Notes (Signed)
[place] EMERGENCY DEPARTMENT AT [facility] Provider Note  CSN: [id number] Arrival date & time: 06/13/22 0446  Chief Complaint(s) Fall  HPI Jacob Becker is a 89 y.o. male with PMH atrial fibrillation on Xarelto, prostate cancer with mets to the bone, HTN, HLD who presents emergency department for evaluation of a fall on blood thinners.  Patient arrives as a level 2 trauma.  He states that he was walking into his closet, tripped on his own feet and fell forward into the closet.  He states he was unable to get off of the floor and called EMS.  Patient arrives with Foley catheter in place with associated leg bag, complains of left elbow tenderness at the site of a skin tear but denies headache, numbness, tingling, weakness, chest pain, shortness of breath or any other systemic or traumatic complaints.   Past Medical History Past Medical History:  Diagnosis Date   AF (atrial fibrillation) (HCC)    Arthritis    Cancer (HCC)    prostate   Dysrhythmia    af   Hyperlipemia    Hypertension    Metastatic bone cancer    from prostate   Wears glasses    Patient Active Problem List   Diagnosis Date Noted   Olecranon bursitis of left elbow 01/18/2022   Medication monitoring encounter 01/18/2022   E. coli UTI 01/18/2022   Near syncope 12/28/2021   AKI (acute kidney injury) (HCC) 12/28/2021   Hemothorax on right 12/28/2021   Hyperkalemia 12/28/2021   A-fib (HCC) 12/28/2021   Diarrhea 12/05/2020   H/O abnormal weight loss 12/05/2020   H/O therapeutic radiation 12/05/2020   Intermittent self-catheterization of bladder 12/05/2020   Loss of appetite 12/05/2020   Mild cognitive impairment with memory loss 12/05/2020   Nocturia 12/05/2020   Anemia, iron deficiency 06/24/2017   AF (atrial fibrillation) (HCC) 07/07/2014   Coronary artery disease involving native heart 07/07/2014   Essential hypertension 07/07/2014   Chronic anticoagulation 07/07/2014   Hyperlipidemia  07/07/2014   Prostate cancer (HCC) 07/07/2014   Neurogenic bladder disorder 10/28/2011   Post-traumatic male urethral stricture 10/28/2011   Retention of urine 10/09/2011   Home Medication(s) Prior to Admission medications   Medication Sig Start Date End Date Taking? Authorizing Provider  acetaminophen (TYLENOL) 500 MG tablet Take 2 tablets (1,000 mg total) by mouth 3 (three) times daily. Patient not taking: Reported on 02/01/2022 01/10/22   Glade Lloyd, MD  amLODipine (NORVASC) 10 MG tablet Take 0.5 tablets (5 mg total) by mouth daily. Patient not taking: Reported on 02/01/2022 01/10/22   Glade Lloyd, MD  amLODipine (NORVASC) 5 MG tablet Take 5 mg by mouth daily. 08/25/18   [provider]  Calcium Citrate 250 MG TABS Take 1 tablet by mouth 2 (two) times daily.    [provider]  Calcium-Magnesium-Vitamin D (CALCIUM 500 PO) Take 500 mg by mouth 2 (two) times daily. Patient not taking: Reported on 02/01/2022    [provider]  Coenzyme Q10 (CO Q 10) 100 MG CAPS Take 100 mg by mouth daily. Patient not taking: Reported on 02/01/2022    [provider]  Coenzyme Q10 (COQ10) 100 MG CAPS Take 1 capsule by mouth daily.    [provider]  colesevelam (WELCHOL) 625 MG tablet Take 625 mg by mouth 2 (two) times daily with a meal. Patient not taking: Reported on 02/01/2022    [provider]  colesevelam (WELCHOL) 625 MG tablet Take 1,250 mg by mouth 2 (  two) times daily. 12/25/21   [provider]  diphenhydramine-acetaminophen (TYLENOL PM) 25-500 MG TABS tablet Take 2 tablets by mouth at bedtime as needed. Patient not taking: Reported on 02/01/2022    [provider]  docusate sodium (COLACE) 100 MG capsule Take 1 capsule (100 mg total) by mouth 2 (two) times daily. 01/10/22   Glade Lloyd, MD  doxycycline (VIBRAMYCIN) 100 MG capsule Take 100 mg by mouth 2 (two) times daily. Patient not taking: Reported on 02/01/2022 03/02/20    [provider]  Ensure (ENSURE) Take 237 mLs by mouth daily as needed (as directed).    [provider]  enzalutamide Diana Eves) 80 MG tablet Take 160 mg by mouth daily.    [provider]  ferrous sulfate 325 (65 FE) MG tablet Take 1 tablet (325 mg total) by mouth daily with breakfast. Patient not taking: Reported on 02/01/2022 06/24/17   Lyn Records, MD  imiquimod (ALDARA) 5 % cream APPLY TO SCALP AND EAR NIGHTLY MONDAY THRU FRIDAY FOR 4 WEEKS Patient not taking: Reported on 02/01/2022 03/30/19   [provider]  lidocaine (LIDODERM) 5 % Place 1 patch onto the skin daily. Remove & Discard patch within 12 hours or as directed by MD 01/10/22   Glade Lloyd, MD  methocarbamol (ROBAXIN) 500 MG tablet Take 1 tablet (500 mg total) by mouth every 6 (six) hours as needed for muscle spasms. 01/10/22   Glade Lloyd, MD  metoprolol succinate (TOPROL-XL) 100 MG 24 hr tablet Take 1 tablet (100mg ) every morning and 0.5 tablet (50mg ) every evening) Take with or immediately following a meal. Patient not taking: Reported on 02/01/2022 05/31/21   Lyn Records, MD  metoprolol succinate (TOPROL-XL) 100 MG 24 hr tablet Take 0.5 tablets (50 mg total) by mouth daily. 01/10/22   Glade Lloyd, MD  Multiple Vitamin (MULTIVITAMIN) capsule Take 1 capsule by mouth daily. Patient not taking: Reported on 02/01/2022    [provider]  nitroGLYCERIN (NITROSTAT) 0.4 MG SL tablet ONE TABLET UNDER TONGUE EVERY 5 MINUTES AS NEEDED FOR CHEST PAIN Patient not taking: Reported on 02/01/2022 12/25/15   Lyn Records, MD  NON FORMULARY Antifungal toenail solution from [facility], order faxed 09/08/2018 HS-[facility]MA Patient not taking: Reported on 02/01/2022    [provider]  Omega-3 Fatty Acids (FISH OIL PO) Take 1,000 mg by mouth 2 (two) times daily.    [provider]  Omega-3 Fatty Acids (FISH OIL) 1000 MG CAPS Take 1 capsule by mouth in the morning and at  bedtime. Patient not taking: Reported on 02/01/2022    [provider]  polyethylene glycol (MIRALAX / GLYCOLAX) 17 g packet Take 17 g by mouth daily as needed for mild constipation. 01/10/22   Glade Lloyd, MD  ramipril (ALTACE) 10 MG capsule Take 1 capsule (10 mg total) by mouth 2 (two) times daily. Patient not taking: Reported on 02/01/2022 08/01/21   Lyn Records, MD  rosuvastatin (CRESTOR) 40 MG tablet Take 40 mg by mouth daily. Patient not taking: Reported on 02/01/2022    [provider]  rosuvastatin (CRESTOR) 40 MG tablet Take 40 mg by mouth daily. 12/21/21   [provider]  traMADol (ULTRAM) 50 MG tablet Take 50 mg by mouth every 6 (six) hours as needed. 01/22/22   [provider]  triamcinolone (KENALOG) 0.1 % Apply topically 2 (two) times daily. Patient not taking: Reported on 02/01/2022 03/27/20   [provider]  triamterene-hydrochlorothiazide (DYAZIDE) 37.5-25 MG capsule  TAKE ONE CAPSULE BY MOUTH EVERY DAY **NEED OFFICE VISIT** Patient not taking: Reported on 02/01/2022 07/24/21   Lyn Records, MD  XARELTO 20 MG TABS tablet TAKE ONE TABLET [facility] DAY Patient not taking: Reported on 02/01/2022 10/18/14   Lyn Records, MD  XTANDI 40 MG tablet Take 160 mg by mouth at bedtime. Patient not taking: Reported on 02/01/2022 12/14/21   [provider]                                                                                                                                    Past Surgical History Past Surgical History:  Procedure Laterality Date   CHEILECTOMY Right 10/07/2013   Procedure: RIGHT HALLUX CHEILECTOMY;  Surgeon: Toni Arthurs, MD;  Location: [facility];  Service: Orthopedics;  Laterality: Right;   COLONOSCOPY     CYSTOSCOPY PROSTATE W/ LASER  2012   turp   HERNIA REPAIR     rt and lt ing   KNEE ARTHROSCOPY  2005   right   TONSILLECTOMY     Family History Family History  Problem Relation Age of Onset    Heart failure Father     Social History Social History   Tobacco Use   Smoking status: Unknown   Smokeless tobacco: Never   Tobacco comments:    States no tobacco use  Vaping Use   Vaping Use: Never used  Substance Use Topics   Alcohol use: Not Currently    Comment: daily   Drug use: No   Allergies Patient has no known allergies.  Review of Systems Review of Systems  Musculoskeletal:  Positive for arthralgias and myalgias.    Physical Exam Vital Signs  I have reviewed the triage vital signs BP (!) 173/98 (BP Location: Right Arm)   Pulse 80   Temp (!) 97.5 F (36.4 C) (Oral)   Resp 20   SpO2 96%   Physical Exam Constitutional:      General: He is not in acute distress.    Appearance: Normal appearance.  HENT:     Head: Normocephalic and atraumatic.     Nose: No congestion or rhinorrhea.  Eyes:     General:        Right eye: No discharge.        Left eye: No discharge.     Extraocular Movements: Extraocular movements intact.     Pupils: Pupils are equal, round, and reactive to light.  Cardiovascular:     Rate and Rhythm: Normal rate and regular rhythm.     Heart sounds: No murmur heard. Pulmonary:     Effort: No respiratory distress.     Breath sounds: Rales present. No wheezing.  Abdominal:     General: There is no distension.     Tenderness: There is no abdominal tenderness.  Musculoskeletal:        General: Normal range of motion.  Cervical back: Normal range of motion.  Skin:    General: Skin is warm and dry.     Findings: Lesion present.  Neurological:     General: No focal deficit present.     Mental Status: He is alert.     ED Results and Treatments Labs (all labs ordered are listed, but only abnormal results are displayed) Labs Reviewed  COMPREHENSIVE METABOLIC PANEL - Abnormal; Notable for the following components:      Result Value   CO2 21 (*)    Calcium 8.5 (*)    Total Protein 6.3 (*)    Albumin 2.4 (*)    Alkaline  Phosphatase 269 (*)    GFR, Estimated 56 (*)    All other components within normal limits  CBC WITH DIFFERENTIAL/PLATELET - Abnormal; Notable for the following components:   RBC 2.96 (*)    Hemoglobin 8.4 (*)    HCT 26.9 (*)    RDW 17.3 (*)    Lymphs Abs 0.6 (*)    All other components within normal limits                                                                                                                          Radiology CT Cervical Spine Wo Contrast  Result Date: 06/13/2022 CLINICAL DATA:  89 year old male status post unwitnessed fall. On blood thinners. Forehead abrasion. Reported history of prostate cancer. EXAM: CT CERVICAL SPINE WITHOUT CONTRAST TECHNIQUE: Multidetector CT imaging of the cervical spine was performed without intravenous contrast. Multiplanar CT image reconstructions were also generated. RADIATION DOSE REDUCTION: This exam was performed according to the departmental dose-optimization program which includes automated exposure control, adjustment of the mA and/or kV according to patient size and/or use of iterative reconstruction technique. COMPARISON:  Cervical spine CT 12/28/2021. FINDINGS: Alignment: Stable. Straightening of cervical lordosis with mild chronic degenerative anterolisthesis of C4 on C5. Stable cervicothoracic junction alignment, posterior element alignment. Skull base and vertebrae: Visualized skull base is intact. No atlanto-occipital dissociation. C1 and C2 remain aligned. New round ground-glass, sclerotic C2 vertebral body lesion since December. See coronal image 28. "Diffuse osseous metastatic disease" described on CT Abdomen and Pelvis last year. No other acute osseous abnormality identified. Soft tissues and spinal canal: No prevertebral fluid or swelling. No visible canal hematoma. Moderate calcified carotid bifurcation atherosclerosis bilaterally. Disc levels: Chronic cervical spine degeneration and ankylosis C2-C3, C3-C4, C4-C5. Advanced  chronic C5-C6 and C6-C7 disc and endplate degeneration. Advanced chronic disc degeneration at C7-T1 with vacuum disc there. However, capacious underlying spinal canal. Mild if any associated cervical spinal stenosis. Upper chest: Scattered sclerotic bone lesions in the upper chest have mildly progressed since December including posterior right 1st rib, left posterior 4th rib, T2 vertebral body involvement. Visible upper thoracic vertebrae appear to remain intact. Bilateral layering pleural effusions in the lung apices, new on the left and chronic but mildly decreased on the right. IMPRESSION: 1. No acute traumatic injury identified in the cervical spine.  But multifocal progressed osseous metastatic disease since December in the cervical spine (new C2 body metastasis) and the upper chest. 2. Chronic cervical spine degeneration and ankylosis. 3. Bilateral layering pleural effusions in the lung apices, new on the left since December. Electronically Signed   By: Odessa Fleming M.D.   On: 06/13/2022 05:58   DG Elbow Complete Left  Result Date: 06/13/2022 CLINICAL DATA:  89 year old male status post unwitnessed fall. On blood thinners. Forehead abrasion. EXAM: LEFT ELBOW - COMPLETE 3+ VIEW COMPARISON:  Left elbow series 12/21/2021. FINDINGS: Bone mineralization is within normal limits for age. Maintained alignment. Joint spaces are within normal limits for age. Dorsal soft tissue swelling, but no joint effusion identified on the lateral view. No acute fracture or dislocation identified. IMPRESSION: Dorsal soft tissue swelling. No acute fracture or dislocation identified about the left elbow. Electronically Signed   By: Odessa Fleming M.D.   On: 06/13/2022 05:52   CT HEAD WO CONTRAST ( )  Result Date: 06/13/2022 CLINICAL DATA:  89 year old male status post unwitnessed fall. On blood thinners. Forehead abrasion. EXAM: CT HEAD WITHOUT CONTRAST TECHNIQUE: Contiguous axial images were obtained from the base of the skull through  the vertex without intravenous contrast. RADIATION DOSE REDUCTION: This exam was performed according to the departmental dose-optimization program which includes automated exposure control, adjustment of the mA and/or kV according to patient size and/or use of iterative reconstruction technique. COMPARISON:  Head CT 12/28/2021. FINDINGS: Brain: Stable cerebral volume. No midline shift, ventriculomegaly, mass effect, evidence of mass lesion, intracranial hemorrhage or evidence of cortically based acute infarction. Moderate patchy bilateral white matter hypodensity appears stable. Vascular: Calcified atherosclerosis at the skull base. No suspicious intracranial vascular hyperdensity. Skull: Stable.  No acute osseous abnormality identified. Sinuses/Orbits: Visualized paranasal sinuses and mastoids are stable and well aerated. Other: Left forehead broad-based scalp hematoma or contusion series 4, image 55. Underlying left frontal bone appears intact. No soft tissue gas. Contralateral right lateral convexity probable subdermal sebaceous cyst on series 4, image 60. But furthermore, there is nonspecific lobulated scalp soft tissue thickening at the vertex best seen on coronal image 42. Underlying bone appears stable and intact. Orbits appear stable, negative. IMPRESSION: 1. Left forehead scalp swelling typical of hematoma or contusion. No skull fracture identified. But additional nonspecific lobulated scalp soft tissue thickening at the vertex (coronal image 42). Recommend correlation with physical exam, Skin Cancer not excluded. 2. No acute intracranial abnormality, stable mild for age white matter disease. Electronically Signed   By: Odessa Fleming M.D.   On: 06/13/2022 05:51    Pertinent labs & imaging results that were available during my care of the patient were reviewed by me and considered in my medical decision making (see MDM for details).  Medications Ordered in ED Medications - No data to display  Procedures .Critical Care  Performed by: Glendora Score, MD Authorized by: Glendora Score, MD   Critical care provider statement:    Critical care time (minutes):  30   Critical care was necessary to treat or prevent imminent or life-threatening deterioration of the following conditions:  Trauma   Critical care was time spent personally by me on the following activities:  Development of treatment plan with patient or surrogate, discussions with consultants, evaluation of patient's response to treatment, examination of patient, ordering and review of laboratory studies, ordering and review of radiographic studies, ordering and performing treatments and interventions, pulse oximetry, re-evaluation of patient's condition and review of old charts   (including critical care time)  Medical Decision Making / ED Course   This patient presents to the ED for concern of fall, this involves an extensive number of treatment options, and is a complaint that carries with it a high risk of complications and morbidity.  The differential diagnosis includes fracture, contusion, hematoma, intracranial injury, intracranial hemorrhage, subdural hematoma  MDM: Patient seen emergency room for evaluation of a fall on blood thinners.  Patient arrives as a level 2 trauma activation and primary survey unremarkable.  Secondary survey with a skin tear over the left elbow, multiple squamous cell carcinoma lesions over the scalp, small abrasion of the left forehead, faint rales at bilateral bases.  Laboratory evaluation with an albumin of 2.4, alk phos 269, hemoglobin 8.4 which is baseline for this patient.  Trauma imaging including CT head, C-spine and x-ray of the elbow is reassuringly negative for acute traumatic injuries.  The CT C-spine does show some layering pleural effusions but patient is not complaining  of any shortness of breath, cough or hypoxia today and on my reevaluation is requesting to be discharged.  I did offer additional imaging of the chest which the patient declined and would like to be discharged.  As his trauma exam is overall reassuringly negative he is safe for discharge with outpatient follow-up and thus he was discharged back to his facility.   Additional history obtained:  -External records from outside source obtained and reviewed including: Chart review including previous notes, labs, imaging, consultation notes   Lab Tests: -I ordered, reviewed, and interpreted labs.   The pertinent results include:   Labs Reviewed  COMPREHENSIVE METABOLIC PANEL - Abnormal; Notable for the following components:      Result Value   CO2 21 (*)    Calcium 8.5 (*)    Total Protein 6.3 (*)    Albumin 2.4 (*)    Alkaline Phosphatase 269 (*)    GFR, Estimated 56 (*)    All other components within normal limits  CBC WITH DIFFERENTIAL/PLATELET - Abnormal; Notable for the following components:   RBC 2.96 (*)    Hemoglobin 8.4 (*)    HCT 26.9 (*)    RDW 17.3 (*)    Lymphs Abs 0.6 (*)    All other components within normal limits      EKG   EKG Interpretation  Date/Time:  Thursday Jun 13 2022 04:57:12 EDT Ventricular Rate:  82 PR Interval:    QRS Duration: 100 QT Interval:  398 QTC Calculation: 465 R Axis:   -47 Text Interpretation: Atrial fibrillation Confirmed by Yarielis Funaro ([id number]) on 06/13/2022 4:22:18 PM         Imaging Studies ordered: I ordered imaging studies including CT head, C-spine, elbow x-ray I independently visualized and interpreted imaging. I agree with the radiologist interpretation   Medicines  ordered and prescription drug management: No orders of the defined types were placed in this encounter.   -I have reviewed the patients home medicines and have made adjustments as needed  Critical interventions Trauma activation and  evaluation   Cardiac Monitoring: The patient was maintained on a cardiac monitor.  I personally viewed and interpreted the cardiac monitored which showed an underlying rhythm of: A-fib  Social Determinants of Health:  Factors impacting patients care include: none   Reevaluation: After the interventions noted above, I reevaluated the patient and found that they have :improved  Co morbidities that complicate the patient evaluation  Past Medical History:  Diagnosis Date   AF (atrial fibrillation) (HCC)    Arthritis    Cancer (HCC)    prostate   Dysrhythmia    af   Hyperlipemia    Hypertension    Metastatic bone cancer    from prostate   Wears glasses       Dispostion: I considered admission for this patient, but at this time he does not meet inpatient criteria for admission and he is safe for discharge and outpatient follow-up     Final Clinical Impression(s) / ED Diagnoses Final diagnoses:  Fall, initial encounter  Pleural effusion     @PCDICTATION @    Millette Halberstam, Wyn Forster, MD 06/13/22 1622

## 2022-06-13 NOTE — ED Triage Notes (Signed)
Patient BIB [facility] from [facility] with complaint of unwitnessed fall. + thinners  Abrasion noted to left forehead.

## 2022-06-13 NOTE — ED Notes (Signed)
Trauma Response Nurse Documentation   Jacob Becker is a 89 y.o. male arriving to [facility] ED via [facility]  On Eliquis (apixaban) daily. Trauma was activated as a Level 2 by Beverely Risen based on the following trauma criteria Elderly patients > 65 with head trauma on anti-coagulation (excluding ASA).  Patient cleared for CT by Dr. Audrie Lia. Pt transported to CT with trauma response nurse present to monitor. RN remained with the patient throughout their absence from the department for clinical observation.   GCS 15.  History   Past Medical History:  Diagnosis Date   AF (atrial fibrillation) (HCC)    Arthritis    Cancer (HCC)    prostate   Dysrhythmia    af   Hyperlipemia    Hypertension    Metastatic bone cancer    from prostate   Wears glasses      Past Surgical History:  Procedure Laterality Date   CHEILECTOMY Right 10/07/2013   Procedure: RIGHT HALLUX CHEILECTOMY;  Surgeon: Toni Arthurs, MD;  Location:  [facility];  Service: Orthopedics;  Laterality: Right;   COLONOSCOPY     CYSTOSCOPY PROSTATE W/ LASER  2012   turp   HERNIA REPAIR     rt and lt ing   KNEE ARTHROSCOPY  2005   right   TONSILLECTOMY         Initial Focused Assessment (If applicable, or please see trauma documentation): Airway--intact, no visible obstruction Breathing-- spontaneous, unlabored Circulation-- skin tear to left elbow, bleeding controlled on arrival  CT's Completed:   CT Head and CT C-Spine   Interventions:  See event summary Plan for disposition:  Discharge home   Consults completed:  none at 0641.  Event Summary: Patient brought in by [facility] from nursing facility. Patient had mechanical slip and fall this morning and struck his head on the ground. Patient alert and oriented x4, GCS 15. Manual BP obtained. Lab work obtained. Xray left elbow completed. Patient to CT with TRN and Primary RN. CT head and c-spine completed.  MTP Summary  (If applicable):  N/A  Bedside handoff with ED RN Corrie Dandy.    Leota Sauers  Trauma Response RN  Please call TRN at [phone] for further assistance.

## 2022-06-14 DIAGNOSIS — R338 Other retention of urine: Secondary | ICD-10-CM | POA: Diagnosis not present

## 2022-06-14 DIAGNOSIS — Z466 Encounter for fitting and adjustment of urinary device: Secondary | ICD-10-CM | POA: Diagnosis not present

## 2022-06-14 DIAGNOSIS — N401 Enlarged prostate with lower urinary tract symptoms: Secondary | ICD-10-CM | POA: Diagnosis not present

## 2022-06-17 DIAGNOSIS — S51022A Laceration with foreign body of left elbow, initial encounter: Secondary | ICD-10-CM | POA: Diagnosis not present

## 2022-06-17 DIAGNOSIS — F02818 Dementia in other diseases classified elsewhere, unspecified severity, with other behavioral disturbance: Secondary | ICD-10-CM | POA: Diagnosis not present

## 2022-06-17 DIAGNOSIS — I482 Chronic atrial fibrillation, unspecified: Secondary | ICD-10-CM | POA: Diagnosis not present

## 2022-06-17 DIAGNOSIS — N312 Flaccid neuropathic bladder, not elsewhere classified: Secondary | ICD-10-CM | POA: Diagnosis not present

## 2022-06-18 DIAGNOSIS — Z466 Encounter for fitting and adjustment of urinary device: Secondary | ICD-10-CM | POA: Diagnosis not present

## 2022-06-18 DIAGNOSIS — R338 Other retention of urine: Secondary | ICD-10-CM | POA: Diagnosis not present

## 2022-06-18 DIAGNOSIS — I2510 Atherosclerotic heart disease of native coronary artery without angina pectoris: Secondary | ICD-10-CM | POA: Diagnosis not present

## 2022-06-18 DIAGNOSIS — N401 Enlarged prostate with lower urinary tract symptoms: Secondary | ICD-10-CM | POA: Diagnosis not present

## 2022-06-18 DIAGNOSIS — I482 Chronic atrial fibrillation, unspecified: Secondary | ICD-10-CM | POA: Diagnosis not present

## 2022-06-18 DIAGNOSIS — I10 Essential (primary) hypertension: Secondary | ICD-10-CM | POA: Diagnosis not present

## 2022-06-21 DIAGNOSIS — I2510 Atherosclerotic heart disease of native coronary artery without angina pectoris: Secondary | ICD-10-CM | POA: Diagnosis not present

## 2022-06-21 DIAGNOSIS — I482 Chronic atrial fibrillation, unspecified: Secondary | ICD-10-CM | POA: Diagnosis not present

## 2022-06-21 DIAGNOSIS — N401 Enlarged prostate with lower urinary tract symptoms: Secondary | ICD-10-CM | POA: Diagnosis not present

## 2022-06-21 DIAGNOSIS — R338 Other retention of urine: Secondary | ICD-10-CM | POA: Diagnosis not present

## 2022-06-21 DIAGNOSIS — Z466 Encounter for fitting and adjustment of urinary device: Secondary | ICD-10-CM | POA: Diagnosis not present

## 2022-06-21 DIAGNOSIS — I10 Essential (primary) hypertension: Secondary | ICD-10-CM | POA: Diagnosis not present

## 2022-06-25 DIAGNOSIS — R338 Other retention of urine: Secondary | ICD-10-CM | POA: Diagnosis not present

## 2022-06-25 DIAGNOSIS — I10 Essential (primary) hypertension: Secondary | ICD-10-CM | POA: Diagnosis not present

## 2022-06-25 DIAGNOSIS — I482 Chronic atrial fibrillation, unspecified: Secondary | ICD-10-CM | POA: Diagnosis not present

## 2022-06-25 DIAGNOSIS — I2510 Atherosclerotic heart disease of native coronary artery without angina pectoris: Secondary | ICD-10-CM | POA: Diagnosis not present

## 2022-06-25 DIAGNOSIS — N401 Enlarged prostate with lower urinary tract symptoms: Secondary | ICD-10-CM | POA: Diagnosis not present

## 2022-06-25 DIAGNOSIS — Z466 Encounter for fitting and adjustment of urinary device: Secondary | ICD-10-CM | POA: Diagnosis not present

## 2022-06-26 DIAGNOSIS — I2510 Atherosclerotic heart disease of native coronary artery without angina pectoris: Secondary | ICD-10-CM | POA: Diagnosis not present

## 2022-06-26 DIAGNOSIS — I119 Hypertensive heart disease without heart failure: Secondary | ICD-10-CM | POA: Diagnosis not present

## 2022-06-26 DIAGNOSIS — N401 Enlarged prostate with lower urinary tract symptoms: Secondary | ICD-10-CM | POA: Diagnosis not present

## 2022-06-26 DIAGNOSIS — I10 Essential (primary) hypertension: Secondary | ICD-10-CM | POA: Diagnosis not present

## 2022-06-26 DIAGNOSIS — Z466 Encounter for fitting and adjustment of urinary device: Secondary | ICD-10-CM | POA: Diagnosis not present

## 2022-06-26 DIAGNOSIS — I482 Chronic atrial fibrillation, unspecified: Secondary | ICD-10-CM | POA: Diagnosis not present

## 2022-06-26 DIAGNOSIS — R338 Other retention of urine: Secondary | ICD-10-CM | POA: Diagnosis not present

## 2022-06-28 DIAGNOSIS — I482 Chronic atrial fibrillation, unspecified: Secondary | ICD-10-CM | POA: Diagnosis not present

## 2022-06-28 DIAGNOSIS — C61 Malignant neoplasm of prostate: Secondary | ICD-10-CM | POA: Diagnosis not present

## 2022-06-28 DIAGNOSIS — S32411D Displaced fracture of anterior wall of right acetabulum, subsequent encounter for fracture with routine healing: Secondary | ICD-10-CM | POA: Diagnosis not present

## 2022-06-28 DIAGNOSIS — Z792 Long term (current) use of antibiotics: Secondary | ICD-10-CM | POA: Diagnosis not present

## 2022-06-28 DIAGNOSIS — Z681 Body mass index (BMI) 19 or less, adult: Secondary | ICD-10-CM | POA: Diagnosis not present

## 2022-06-28 DIAGNOSIS — E440 Moderate protein-calorie malnutrition: Secondary | ICD-10-CM | POA: Diagnosis not present

## 2022-06-28 DIAGNOSIS — Z466 Encounter for fitting and adjustment of urinary device: Secondary | ICD-10-CM | POA: Diagnosis not present

## 2022-06-28 DIAGNOSIS — Z87440 Personal history of urinary (tract) infections: Secondary | ICD-10-CM | POA: Diagnosis not present

## 2022-06-28 DIAGNOSIS — D649 Anemia, unspecified: Secondary | ICD-10-CM | POA: Diagnosis not present

## 2022-06-28 DIAGNOSIS — I2510 Atherosclerotic heart disease of native coronary artery without angina pectoris: Secondary | ICD-10-CM | POA: Diagnosis not present

## 2022-06-28 DIAGNOSIS — N401 Enlarged prostate with lower urinary tract symptoms: Secondary | ICD-10-CM | POA: Diagnosis not present

## 2022-06-28 DIAGNOSIS — F0390 Unspecified dementia without behavioral disturbance: Secondary | ICD-10-CM | POA: Diagnosis not present

## 2022-06-28 DIAGNOSIS — R338 Other retention of urine: Secondary | ICD-10-CM | POA: Diagnosis not present

## 2022-06-28 DIAGNOSIS — I10 Essential (primary) hypertension: Secondary | ICD-10-CM | POA: Diagnosis not present

## 2022-06-28 DIAGNOSIS — Z79891 Long term (current) use of opiate analgesic: Secondary | ICD-10-CM | POA: Diagnosis not present

## 2022-07-01 DIAGNOSIS — N401 Enlarged prostate with lower urinary tract symptoms: Secondary | ICD-10-CM | POA: Diagnosis not present

## 2022-07-01 DIAGNOSIS — R338 Other retention of urine: Secondary | ICD-10-CM | POA: Diagnosis not present

## 2022-07-01 DIAGNOSIS — Z466 Encounter for fitting and adjustment of urinary device: Secondary | ICD-10-CM | POA: Diagnosis not present

## 2022-07-01 DIAGNOSIS — I10 Essential (primary) hypertension: Secondary | ICD-10-CM | POA: Diagnosis not present

## 2022-07-01 DIAGNOSIS — I2510 Atherosclerotic heart disease of native coronary artery without angina pectoris: Secondary | ICD-10-CM | POA: Diagnosis not present

## 2022-07-01 DIAGNOSIS — I482 Chronic atrial fibrillation, unspecified: Secondary | ICD-10-CM | POA: Diagnosis not present

## 2022-07-03 DIAGNOSIS — Z466 Encounter for fitting and adjustment of urinary device: Secondary | ICD-10-CM | POA: Diagnosis not present

## 2022-07-03 DIAGNOSIS — I10 Essential (primary) hypertension: Secondary | ICD-10-CM | POA: Diagnosis not present

## 2022-07-03 DIAGNOSIS — R338 Other retention of urine: Secondary | ICD-10-CM | POA: Diagnosis not present

## 2022-07-03 DIAGNOSIS — N401 Enlarged prostate with lower urinary tract symptoms: Secondary | ICD-10-CM | POA: Diagnosis not present

## 2022-07-03 DIAGNOSIS — I2510 Atherosclerotic heart disease of native coronary artery without angina pectoris: Secondary | ICD-10-CM | POA: Diagnosis not present

## 2022-07-03 DIAGNOSIS — I482 Chronic atrial fibrillation, unspecified: Secondary | ICD-10-CM | POA: Diagnosis not present

## 2022-07-08 DIAGNOSIS — I2510 Atherosclerotic heart disease of native coronary artery without angina pectoris: Secondary | ICD-10-CM | POA: Diagnosis not present

## 2022-07-08 DIAGNOSIS — N401 Enlarged prostate with lower urinary tract symptoms: Secondary | ICD-10-CM | POA: Diagnosis not present

## 2022-07-08 DIAGNOSIS — I10 Essential (primary) hypertension: Secondary | ICD-10-CM | POA: Diagnosis not present

## 2022-07-08 DIAGNOSIS — R338 Other retention of urine: Secondary | ICD-10-CM | POA: Diagnosis not present

## 2022-07-08 DIAGNOSIS — I482 Chronic atrial fibrillation, unspecified: Secondary | ICD-10-CM | POA: Diagnosis not present

## 2022-07-08 DIAGNOSIS — Z466 Encounter for fitting and adjustment of urinary device: Secondary | ICD-10-CM | POA: Diagnosis not present

## 2022-07-11 DIAGNOSIS — Z466 Encounter for fitting and adjustment of urinary device: Secondary | ICD-10-CM | POA: Diagnosis not present

## 2022-07-11 DIAGNOSIS — I2510 Atherosclerotic heart disease of native coronary artery without angina pectoris: Secondary | ICD-10-CM | POA: Diagnosis not present

## 2022-07-11 DIAGNOSIS — N401 Enlarged prostate with lower urinary tract symptoms: Secondary | ICD-10-CM | POA: Diagnosis not present

## 2022-07-11 DIAGNOSIS — R338 Other retention of urine: Secondary | ICD-10-CM | POA: Diagnosis not present

## 2022-07-11 DIAGNOSIS — I482 Chronic atrial fibrillation, unspecified: Secondary | ICD-10-CM | POA: Diagnosis not present

## 2022-07-11 DIAGNOSIS — I10 Essential (primary) hypertension: Secondary | ICD-10-CM | POA: Diagnosis not present

## 2022-07-15 DIAGNOSIS — N319 Neuromuscular dysfunction of bladder, unspecified: Secondary | ICD-10-CM | POA: Diagnosis not present

## 2022-07-15 DIAGNOSIS — I2510 Atherosclerotic heart disease of native coronary artery without angina pectoris: Secondary | ICD-10-CM | POA: Diagnosis not present

## 2022-07-15 DIAGNOSIS — I482 Chronic atrial fibrillation, unspecified: Secondary | ICD-10-CM | POA: Diagnosis not present

## 2022-07-15 DIAGNOSIS — S51012D Laceration without foreign body of left elbow, subsequent encounter: Secondary | ICD-10-CM | POA: Diagnosis not present

## 2022-07-15 DIAGNOSIS — N401 Enlarged prostate with lower urinary tract symptoms: Secondary | ICD-10-CM | POA: Diagnosis not present

## 2022-07-15 DIAGNOSIS — F0390 Unspecified dementia without behavioral disturbance: Secondary | ICD-10-CM | POA: Diagnosis not present

## 2022-07-15 DIAGNOSIS — R338 Other retention of urine: Secondary | ICD-10-CM | POA: Diagnosis not present

## 2022-07-15 DIAGNOSIS — I10 Essential (primary) hypertension: Secondary | ICD-10-CM | POA: Diagnosis not present

## 2022-07-15 DIAGNOSIS — Z466 Encounter for fitting and adjustment of urinary device: Secondary | ICD-10-CM | POA: Diagnosis not present

## 2022-07-16 DIAGNOSIS — I482 Chronic atrial fibrillation, unspecified: Secondary | ICD-10-CM | POA: Diagnosis not present

## 2022-07-16 DIAGNOSIS — Z466 Encounter for fitting and adjustment of urinary device: Secondary | ICD-10-CM | POA: Diagnosis not present

## 2022-07-16 DIAGNOSIS — I2510 Atherosclerotic heart disease of native coronary artery without angina pectoris: Secondary | ICD-10-CM | POA: Diagnosis not present

## 2022-07-16 DIAGNOSIS — N401 Enlarged prostate with lower urinary tract symptoms: Secondary | ICD-10-CM | POA: Diagnosis not present

## 2022-07-16 DIAGNOSIS — I10 Essential (primary) hypertension: Secondary | ICD-10-CM | POA: Diagnosis not present

## 2022-07-16 DIAGNOSIS — R338 Other retention of urine: Secondary | ICD-10-CM | POA: Diagnosis not present

## 2022-07-17 ENCOUNTER — Emergency Department (HOSPITAL_COMMUNITY): Payer: Medicare Other

## 2022-07-17 ENCOUNTER — Encounter (HOSPITAL_COMMUNITY): Payer: Self-pay

## 2022-07-17 ENCOUNTER — Emergency Department (HOSPITAL_COMMUNITY)
Admission: EM | Admit: 2022-07-17 | Discharge: 2022-07-17 | Disposition: A | Discharge status: Home / Self Care | Payer: Medicare Other | Attending: Student | Admitting: Student

## 2022-07-17 ENCOUNTER — Other Ambulatory Visit: Payer: Self-pay

## 2022-07-17 DIAGNOSIS — J90 Pleural effusion, not elsewhere classified: Secondary | ICD-10-CM | POA: Insufficient documentation

## 2022-07-17 DIAGNOSIS — M25522 Pain in left elbow: Secondary | ICD-10-CM | POA: Insufficient documentation

## 2022-07-17 DIAGNOSIS — M25561 Pain in right knee: Secondary | ICD-10-CM | POA: Insufficient documentation

## 2022-07-17 DIAGNOSIS — C787 Secondary malignant neoplasm of liver and intrahepatic bile duct: Secondary | ICD-10-CM | POA: Diagnosis not present

## 2022-07-17 DIAGNOSIS — Z79899 Other long term (current) drug therapy: Secondary | ICD-10-CM | POA: Diagnosis not present

## 2022-07-17 DIAGNOSIS — M25521 Pain in right elbow: Secondary | ICD-10-CM | POA: Diagnosis not present

## 2022-07-17 DIAGNOSIS — C799 Secondary malignant neoplasm of unspecified site: Secondary | ICD-10-CM

## 2022-07-17 DIAGNOSIS — S0990XA Unspecified injury of head, initial encounter: Secondary | ICD-10-CM | POA: Diagnosis not present

## 2022-07-17 DIAGNOSIS — C801 Malignant (primary) neoplasm, unspecified: Secondary | ICD-10-CM | POA: Diagnosis not present

## 2022-07-17 DIAGNOSIS — J984 Other disorders of lung: Secondary | ICD-10-CM | POA: Insufficient documentation

## 2022-07-17 DIAGNOSIS — Z7901 Long term (current) use of anticoagulants: Secondary | ICD-10-CM | POA: Diagnosis not present

## 2022-07-17 DIAGNOSIS — M25551 Pain in right hip: Secondary | ICD-10-CM | POA: Diagnosis not present

## 2022-07-17 DIAGNOSIS — W1830XA Fall on same level, unspecified, initial encounter: Secondary | ICD-10-CM | POA: Insufficient documentation

## 2022-07-17 DIAGNOSIS — W19XXXA Unspecified fall, initial encounter: Secondary | ICD-10-CM

## 2022-07-17 DIAGNOSIS — Z8546 Personal history of malignant neoplasm of prostate: Secondary | ICD-10-CM | POA: Insufficient documentation

## 2022-07-17 DIAGNOSIS — I10 Essential (primary) hypertension: Secondary | ICD-10-CM | POA: Diagnosis not present

## 2022-07-17 DIAGNOSIS — R4182 Altered mental status, unspecified: Secondary | ICD-10-CM | POA: Diagnosis not present

## 2022-07-17 DIAGNOSIS — T07XXXA Unspecified multiple injuries, initial encounter: Secondary | ICD-10-CM | POA: Diagnosis not present

## 2022-07-17 DIAGNOSIS — Z7401 Bed confinement status: Secondary | ICD-10-CM | POA: Diagnosis not present

## 2022-07-17 DIAGNOSIS — C7951 Secondary malignant neoplasm of bone: Secondary | ICD-10-CM | POA: Diagnosis not present

## 2022-07-17 DIAGNOSIS — Z043 Encounter for examination and observation following other accident: Secondary | ICD-10-CM | POA: Diagnosis not present

## 2022-07-17 DIAGNOSIS — R410 Disorientation, unspecified: Secondary | ICD-10-CM | POA: Diagnosis not present

## 2022-07-17 DIAGNOSIS — R918 Other nonspecific abnormal finding of lung field: Secondary | ICD-10-CM | POA: Diagnosis not present

## 2022-07-17 LAB — I-STAT CHEM 8, ED
BUN: 30 mg/dL — ABNORMAL HIGH (ref 8–23)
Calcium, Ion: 1.13 mmol/L — ABNORMAL LOW (ref 1.15–1.40)
Chloride: 110 mmol/L (ref 98–111)
Creatinine, Ser: 1.4 mg/dL — ABNORMAL HIGH (ref 0.61–1.24)
Glucose, Bld: 97 mg/dL (ref 70–99)
HCT: 27 % — ABNORMAL LOW (ref 39.0–52.0)
Hemoglobin: 9.2 g/dL — ABNORMAL LOW (ref 13.0–17.0)
Potassium: 4.2 mmol/L (ref 3.5–5.1)
Sodium: 138 mmol/L (ref 135–145)
TCO2: 18 mmol/L — ABNORMAL LOW (ref 22–32)

## 2022-07-17 LAB — CBC WITH DIFFERENTIAL/PLATELET
Abs Immature Granulocytes: 0.04 10*3/uL (ref 0.00–0.07)
Basophils Absolute: 0 10*3/uL (ref 0.0–0.1)
Basophils Relative: 0 %
Eosinophils Absolute: 0 10*3/uL (ref 0.0–0.5)
Eosinophils Relative: 0 %
HCT: 28.1 % — ABNORMAL LOW (ref 39.0–52.0)
Hemoglobin: 8.8 g/dL — ABNORMAL LOW (ref 13.0–17.0)
Immature Granulocytes: 1 %
Lymphocytes Relative: 8 %
Lymphs Abs: 0.6 10*3/uL — ABNORMAL LOW (ref 0.7–4.0)
MCH: 29.1 pg (ref 26.0–34.0)
MCHC: 31.3 g/dL (ref 30.0–36.0)
MCV: 93 fL (ref 80.0–100.0)
Monocytes Absolute: 0.6 10*3/uL (ref 0.1–1.0)
Monocytes Relative: 8 %
Neutro Abs: 6.3 10*3/uL (ref 1.7–7.7)
Neutrophils Relative %: 83 %
Platelets: 175 10*3/uL (ref 150–400)
RBC: 3.02 MIL/uL — ABNORMAL LOW (ref 4.22–5.81)
RDW: 19.6 % — ABNORMAL HIGH (ref 11.5–15.5)
WBC: 7.6 10*3/uL (ref 4.0–10.5)
nRBC: 0 % (ref 0.0–0.2)

## 2022-07-17 LAB — COMPREHENSIVE METABOLIC PANEL
ALT: 24 U/L (ref 0–44)
AST: 37 U/L (ref 15–41)
Albumin: 2.5 g/dL — ABNORMAL LOW (ref 3.5–5.0)
Alkaline Phosphatase: 298 U/L — ABNORMAL HIGH (ref 38–126)
Anion gap: 11 (ref 5–15)
BUN: 29 mg/dL — ABNORMAL HIGH (ref 8–23)
CO2: 17 mmol/L — ABNORMAL LOW (ref 22–32)
Calcium: 8.3 mg/dL — ABNORMAL LOW (ref 8.9–10.3)
Chloride: 107 mmol/L (ref 98–111)
Creatinine, Ser: 1.42 mg/dL — ABNORMAL HIGH (ref 0.61–1.24)
GFR, Estimated: 47 mL/min — ABNORMAL LOW (ref >60)
Glucose, Bld: 99 mg/dL (ref 70–99)
Potassium: 4 mmol/L (ref 3.5–5.1)
Sodium: 135 mmol/L (ref 135–145)
Total Bilirubin: 0.7 mg/dL (ref 0.3–1.2)
Total Protein: 6.6 g/dL (ref 6.5–8.1)

## 2022-07-17 MED ORDER — IOHEXOL 350 MG/ML SOLN
60.0000 mL | Freq: Once | INTRAVENOUS | Status: AC | PRN
  Administered 2022-07-17: 60 mL via INTRAVENOUS

## 2022-07-17 NOTE — Progress Notes (Signed)
   07/17/22 0610  Spiritual Encounters  Type of Visit Initial  Care provided to: Patient  Referral source Trauma page  Reason for visit Trauma  OnCall Visit No   Chaplain responded to a level two trauma. The patient, Jacob Becker was attended to by the medical team.No family is present.  I was able to meet with Sherilyn Cooter after he returned to his room. He acknowledged my presence with a nod,but was not conversant at the time. His gaze seem far away.  If a chaplain is requested someone will respond.   Valerie Roys

## 2022-07-17 NOTE — Progress Notes (Signed)
Orthopedic Tech Progress Note Patient Details:  Jacob Becker 09/17/1929 [id number]  Patient ID: Jacob Becker, male   DOB: [birth year], 89 y.o.   MRN: [id number] Level 2 trauma, not needed at the moment. Kallin Henk L Jasson Siegmann 07/17/2022, 6:15 AM

## 2022-07-17 NOTE — ED Triage Notes (Signed)
Pt comes from a facility post fall trying to help his wife up. A&O x2. He is on xerelto for a-fib. States he did hit his head.

## 2022-07-17 NOTE — ED Notes (Signed)
Patient transported to CT 

## 2022-07-17 NOTE — ED Notes (Signed)
Pt from [facility] per prior charts. Papers EMS brought could not be any less helpful, and patient is a bad historian.

## 2022-07-17 NOTE — ED Provider Notes (Signed)
[place] EMERGENCY DEPARTMENT AT [facility] Provider Note  CSN: [id number] Arrival date & time: 07/17/22 0600  Chief Complaint(s) Fall and Trauma  HPI Jacob Becker is a 89 y.o. male with PMH A-fib on Xarelto, metastatic prostate cancer who presents emergency department for evaluation of a fall.  Patient arrives as a level 2 trauma after attempting to assist his wife who fell at their skilled nursing facility and he fell himself.  Patient arrives with complaints of bilateral elbow pain, knee pain but denies chest pain, shortness of breath, abdominal pain, nausea, vomiting, headache, fever or other systemic symptoms.  Denies numbness, tingling, weakness or other neurologic complaints.   Past Medical History Past Medical History:  Diagnosis Date   AF (atrial fibrillation) (HCC)    Arthritis    Cancer (HCC)    prostate   Dysrhythmia    af   Hyperlipemia    Hypertension    Metastatic bone cancer    from prostate   Wears glasses    Patient Active Problem List   Diagnosis Date Noted   Olecranon bursitis of left elbow 01/18/2022   Medication monitoring encounter 01/18/2022   E. coli UTI 01/18/2022   Near syncope 12/28/2021   AKI (acute kidney injury) (HCC) 12/28/2021   Hemothorax on right 12/28/2021   Hyperkalemia 12/28/2021   A-fib (HCC) 12/28/2021   Diarrhea 12/05/2020   H/O abnormal weight loss 12/05/2020   H/O therapeutic radiation 12/05/2020   Intermittent self-catheterization of bladder 12/05/2020   Loss of appetite 12/05/2020   Mild cognitive impairment with memory loss 12/05/2020   Nocturia 12/05/2020   Anemia, iron deficiency 06/24/2017   AF (atrial fibrillation) (HCC) 07/07/2014   Coronary artery disease involving native heart 07/07/2014   Essential hypertension 07/07/2014   Chronic anticoagulation 07/07/2014   Hyperlipidemia 07/07/2014   Prostate cancer (HCC) 07/07/2014   Neurogenic bladder disorder 10/28/2011   Post-traumatic male urethral  stricture 10/28/2011   Retention of urine 10/09/2011   Home Medication(s) Prior to Admission medications   Medication Sig Start Date End Date Taking? Authorizing Provider  acetaminophen (TYLENOL) 500 MG tablet Take 2 tablets (1,000 mg total) by mouth 3 (three) times daily. Patient not taking: Reported on 02/01/2022 01/10/22   Glade Lloyd, MD  amLODipine (NORVASC) 10 MG tablet Take 0.5 tablets (5 mg total) by mouth daily. Patient not taking: Reported on 02/01/2022 01/10/22   Glade Lloyd, MD  amLODipine (NORVASC) 5 MG tablet Take 5 mg by mouth daily. 08/25/18   [provider]  Calcium Citrate 250 MG TABS Take 1 tablet by mouth 2 (two) times daily.    [provider]  Calcium-Magnesium-Vitamin D (CALCIUM 500 PO) Take 500 mg by mouth 2 (two) times daily. Patient not taking: Reported on 02/01/2022    [provider]  Coenzyme Q10 (CO Q 10) 100 MG CAPS Take 100 mg by mouth daily. Patient not taking: Reported on 02/01/2022    [provider]  Coenzyme Q10 (COQ10) 100 MG CAPS Take 1 capsule by mouth daily.    [provider]  colesevelam (WELCHOL) 625 MG tablet Take 625 mg by mouth 2 (two) times daily with a meal. Patient not taking: Reported on 02/01/2022    [provider]  colesevelam (WELCHOL) 625 MG tablet Take 1,250 mg by mouth 2 (two) times daily. 12/25/21   [provider]  diphenhydramine-acetaminophen (TYLENOL PM) 25-500 MG TABS tablet Take 2 tablets by mouth at bedtime as needed. Patient not taking: Reported on 02/01/2022  [provider]  docusate sodium (COLACE) 100 MG capsule Take 1 capsule (100 mg total) by mouth 2 (two) times daily. 01/10/22   Glade Lloyd, MD  doxycycline (VIBRAMYCIN) 100 MG capsule Take 100 mg by mouth 2 (two) times daily. Patient not taking: Reported on 02/01/2022 03/02/20   [provider]  Ensure (ENSURE) Take 237 mLs by mouth daily as needed (as directed).    [provider]   enzalutamide Diana Eves) 80 MG tablet Take 160 mg by mouth daily.    [provider]  ferrous sulfate 325 (65 FE) MG tablet Take 1 tablet (325 mg total) by mouth daily with breakfast. Patient not taking: Reported on 02/01/2022 06/24/17   Lyn Records, MD  imiquimod (ALDARA) 5 % cream APPLY TO SCALP AND EAR NIGHTLY MONDAY THRU FRIDAY FOR 4 WEEKS Patient not taking: Reported on 02/01/2022 03/30/19   [provider]  lidocaine (LIDODERM) 5 % Place 1 patch onto the skin daily. Remove & Discard patch within 12 hours or as directed by MD 01/10/22   Glade Lloyd, MD  methocarbamol (ROBAXIN) 500 MG tablet Take 1 tablet (500 mg total) by mouth every 6 (six) hours as needed for muscle spasms. 01/10/22   Glade Lloyd, MD  metoprolol succinate (TOPROL-XL) 100 MG 24 hr tablet Take 1 tablet (100mg ) every morning and 0.5 tablet (50mg ) every evening) Take with or immediately following a meal. Patient not taking: Reported on 02/01/2022 05/31/21   Lyn Records, MD  metoprolol succinate (TOPROL-XL) 100 MG 24 hr tablet Take 0.5 tablets (50 mg total) by mouth daily. 01/10/22   Glade Lloyd, MD  Multiple Vitamin (MULTIVITAMIN) capsule Take 1 capsule by mouth daily. Patient not taking: Reported on 02/01/2022    [provider]  nitroGLYCERIN (NITROSTAT) 0.4 MG SL tablet ONE TABLET UNDER TONGUE EVERY 5 MINUTES AS NEEDED FOR CHEST PAIN Patient not taking: Reported on 02/01/2022 12/25/15   Lyn Records, MD  NON FORMULARY Antifungal toenail solution from [facility], order faxed 09/08/2018 [facility] Patient not taking: Reported on 02/01/2022    [provider]  Omega-3 Fatty Acids (FISH OIL PO) Take 1,000 mg by mouth 2 (two) times daily.    [provider]  Omega-3 Fatty Acids (FISH OIL) 1000 MG CAPS Take 1 capsule by mouth in the morning and at bedtime. Patient not taking: Reported on 02/01/2022    [provider]  polyethylene glycol (MIRALAX / GLYCOLAX) 17 g packet Take  17 g by mouth daily as needed for mild constipation. 01/10/22   Glade Lloyd, MD  ramipril (ALTACE) 10 MG capsule Take 1 capsule (10 mg total) by mouth 2 (two) times daily. Patient not taking: Reported on 02/01/2022 08/01/21   Lyn Records, MD  rosuvastatin (CRESTOR) 40 MG tablet Take 40 mg by mouth daily. Patient not taking: Reported on 02/01/2022    [provider]  rosuvastatin (CRESTOR) 40 MG tablet Take 40 mg by mouth daily. 12/21/21   [provider]  traMADol (ULTRAM) 50 MG tablet Take 50 mg by mouth every 6 (six) hours as needed. 01/22/22   [provider]  triamcinolone (KENALOG) 0.1 % Apply topically 2 (two) times daily. Patient not taking: Reported on 02/01/2022 03/27/20   [provider]  triamterene-hydrochlorothiazide (DYAZIDE) 37.5-25 MG capsule TAKE ONE CAPSULE BY MOUTH EVERY DAY **NEED OFFICE VISIT** Patient not taking: Reported on 02/01/2022 07/24/21   Lyn Records, MD  XARELTO 20 MG TABS tablet TAKE ONE TABLET [facility] DAY Patient  not taking: Reported on 02/01/2022 10/18/14   Lyn Records, MD  XTANDI 40 MG tablet Take 160 mg by mouth at bedtime. Patient not taking: Reported on 02/01/2022 12/14/21   [provider]                                                                                                                                    Past Surgical History Past Surgical History:  Procedure Laterality Date   CHEILECTOMY Right 10/07/2013   Procedure: RIGHT HALLUX CHEILECTOMY;  Surgeon: Toni Arthurs, MD;  Location: [facility];  Service: Orthopedics;  Laterality: Right;   COLONOSCOPY     CYSTOSCOPY PROSTATE W/ LASER  2012   turp   HERNIA REPAIR     rt and lt ing   KNEE ARTHROSCOPY  2005   right   TONSILLECTOMY     Family History Family History  Problem Relation Age of Onset   Heart failure Father     Social History Social History   Tobacco Use   Smoking status: Unknown   Smokeless tobacco: Never   Tobacco  comments:    States no tobacco use  Vaping Use   Vaping Use: Never used  Substance Use Topics   Alcohol use: Not Currently    Comment: daily   Drug use: No   Allergies Patient has no known allergies.  Review of Systems Review of Systems  Musculoskeletal:  Positive for arthralgias and myalgias.    Physical Exam Vital Signs  I have reviewed the triage vital signs BP (!) 158/80 (BP Location: Right Arm)   Pulse 68   Temp 97.6 F (36.4 C)   Resp 20   SpO2 98%   Physical Exam Constitutional:      General: He is not in acute distress.    Appearance: Normal appearance.  HENT:     Head: Normocephalic and atraumatic.     Nose: No congestion or rhinorrhea.  Eyes:     General:        Right eye: No discharge.        Left eye: No discharge.     Extraocular Movements: Extraocular movements intact.     Pupils: Pupils are equal, round, and reactive to light.  Cardiovascular:     Rate and Rhythm: Normal rate and regular rhythm.     Heart sounds: No murmur heard. Pulmonary:     Effort: No respiratory distress.     Breath sounds: Rales present. No wheezing.  Abdominal:     General: There is no distension.     Tenderness: There is no abdominal tenderness.  Musculoskeletal:        General: Tenderness present. Normal range of motion.     Cervical back: Normal range of motion.  Skin:    General: Skin is warm and dry.  Neurological:     General: No focal deficit present.     Mental  Status: He is alert.     ED Results and Treatments Labs (all labs ordered are listed, but only abnormal results are displayed) Labs Reviewed  CBC WITH DIFFERENTIAL/PLATELET - Abnormal; Notable for the following components:      Result Value   RBC 3.02 (*)    Hemoglobin 8.8 (*)    HCT 28.1 (*)    RDW 19.6 (*)    Lymphs Abs 0.6 (*)    All other components within normal limits  I-STAT CHEM 8, ED - Abnormal; Notable for the following components:   BUN 30 (*)    Creatinine, Ser 1.40 (*)     Calcium, Ion 1.13 (*)    TCO2 18 (*)    Hemoglobin 9.2 (*)    HCT 27.0 (*)    All other components within normal limits  COMPREHENSIVE METABOLIC PANEL                                                                                                                          Radiology CT HEAD WO CONTRAST ( )  Result Date: 07/17/2022 CLINICAL DATA:  Minor head trauma EXAM: CT HEAD WITHOUT CONTRAST CT CERVICAL SPINE WITHOUT CONTRAST TECHNIQUE: Multidetector CT imaging of the head and cervical spine was performed following the standard protocol without intravenous contrast. Multiplanar CT image reconstructions of the cervical spine were also generated. RADIATION DOSE REDUCTION: This exam was performed according to the departmental dose-optimization program which includes automated exposure control, adjustment of the mA and/or kV according to patient size and/or use of iterative reconstruction technique. COMPARISON:  06/13/2022 FINDINGS: CT HEAD FINDINGS Brain: No evidence of acute infarction, hemorrhage, hydrocephalus, extra-axial collection or mass lesion/mass effect. Generalized atrophy. Moderate chronic small vessel ischemia in the cerebral white matter Vascular: No hyperdense vessel or unexpected calcification. Skull: No acute fracture. Sinuses/Orbits: Evidence of injury Other: Scalp nodules bilaterally with the largest at the right paramedian vertex measuring up to 19 mm. A right parietal scalp nodule is seen on prior and has enlarged to 13 mm. CT CERVICAL SPINE FINDINGS Alignment: No traumatic malalignment. C4-5 and C7-T1 degenerative anterolisthesis Skull base and vertebrae: No acute fracture. Sclerosis especially in the C2, C7, and T2 vertebrae consistent with history of osseous metastatic disease. Partially covered rib lesions as well. Soft tissues and spinal canal: No prevertebral fluid or swelling. No visible canal hematoma. Disc levels: Extensive degenerative endplate and facet spurring throughout  the cervical spine. Upper chest: No evidence of injury IMPRESSION: 1. No evidence of acute intracranial or cervical spine injury. 2. Progressing scalp nodules when compared to May 2024, tumor not excluded. 3. Known osseous metastatic disease. Electronically Signed   By: Tiburcio Pea M.D.   On: 07/17/2022 06:48   CT Cervical Spine Wo Contrast  Result Date: 07/17/2022 CLINICAL DATA:  Minor head trauma EXAM: CT HEAD WITHOUT CONTRAST CT CERVICAL SPINE WITHOUT CONTRAST TECHNIQUE: Multidetector CT imaging of the head and cervical spine was performed following the standard protocol without intravenous contrast.  Multiplanar CT image reconstructions of the cervical spine were also generated. RADIATION DOSE REDUCTION: This exam was performed according to the departmental dose-optimization program which includes automated exposure control, adjustment of the mA and/or kV according to patient size and/or use of iterative reconstruction technique. COMPARISON:  06/13/2022 FINDINGS: CT HEAD FINDINGS Brain: No evidence of acute infarction, hemorrhage, hydrocephalus, extra-axial collection or mass lesion/mass effect. Generalized atrophy. Moderate chronic small vessel ischemia in the cerebral white matter Vascular: No hyperdense vessel or unexpected calcification. Skull: No acute fracture. Sinuses/Orbits: Evidence of injury Other: Scalp nodules bilaterally with the largest at the right paramedian vertex measuring up to 19 mm. A right parietal scalp nodule is seen on prior and has enlarged to 13 mm. CT CERVICAL SPINE FINDINGS Alignment: No traumatic malalignment. C4-5 and C7-T1 degenerative anterolisthesis Skull base and vertebrae: No acute fracture. Sclerosis especially in the C2, C7, and T2 vertebrae consistent with history of osseous metastatic disease. Partially covered rib lesions as well. Soft tissues and spinal canal: No prevertebral fluid or swelling. No visible canal hematoma. Disc levels: Extensive degenerative  endplate and facet spurring throughout the cervical spine. Upper chest: No evidence of injury IMPRESSION: 1. No evidence of acute intracranial or cervical spine injury. 2. Progressing scalp nodules when compared to May 2024, tumor not excluded. 3. Known osseous metastatic disease. Electronically Signed   By: Tiburcio Pea M.D.   On: 07/17/2022 06:48   DG Chest Portable 1 View  Result Date: 07/17/2022 CLINICAL DATA:  Fall. EXAM: PORTABLE CHEST 1 VIEW COMPARISON:  01/10/2022 FINDINGS: Focal consolidative masslike opacity identified in the medial right lower lung, possibly with central cavitation, stable to progressive in the interval. Patchy airspace disease noted left lung base. No evidence for pneumothorax or substantial pleural effusion. Cardiopericardial silhouette is at upper limits of normal for size. Bones are diffusely demineralized. Telemetry leads overlie the chest. IMPRESSION: 1. Stable to progressive masslike consolidative opacity in the medial right lower lung, possibly with central cavitation. Chest CT recommended to further evaluate. 2. Patchy airspace disease left lung base. 3. No evidence for pneumothorax or substantial pleural effusion. Electronically Signed   By: Kennith Center M.D.   On: 07/17/2022 06:39   DG Knee Complete 4 Views Right  Result Date: 07/17/2022 CLINICAL DATA:  Fall, rule out fracture. EXAM: RIGHT KNEE - COMPLETE 4+ VIEW COMPARISON:  None Available. FINDINGS: No evidence of fracture, dislocation, or joint effusion. Joint collapse and degenerative spurring. Subjective osteopenia. Senile chondrocalcinosis. Nonspecific in generalized subcutaneous reticulation. Atheromatous calcification in the lower leg and calf. IMPRESSION: 1. No acute finding. 2. Osteoarthritis with joint space narrowing greatest at the lateral compartment. Electronically Signed   By: Tiburcio Pea M.D.   On: 07/17/2022 06:38   DG Elbow Complete Right  Result Date: 07/17/2022 CLINICAL DATA:  Fall, rule  out elbow fracture EXAM: RIGHT ELBOW - COMPLETE 4 VIEW COMPARISON:  None Available. FINDINGS: There is no evidence of fracture, dislocation, or joint effusion. There is no evidence of arthropathy or other focal bone abnormality. Soft tissues are unremarkable. IMPRESSION: Negative. Electronically Signed   By: Tiburcio Pea M.D.   On: 07/17/2022 06:37    Pertinent labs & imaging results that were available during my care of the patient were reviewed by me and considered in my medical decision making (see MDM for details).  Medications Ordered in ED Medications - No data to display  Procedures .Critical Care  Performed by: Glendora Score, MD Authorized by: Glendora Score, MD   Critical care provider statement:    Critical care time (minutes):  30   Critical care was necessary to treat or prevent imminent or life-threatening deterioration of the following conditions:  Trauma   Critical care was time spent personally by me on the following activities:  Development of treatment plan with patient or surrogate, discussions with consultants, evaluation of patient's response to treatment, examination of patient, ordering and review of laboratory studies, ordering and review of radiographic studies, ordering and performing treatments and interventions, pulse oximetry, re-evaluation of patient's condition and review of old charts   (including critical care time)  Medical Decision Making / ED Course   This patient presents to the ED for concern of fall, this involves an extensive number of treatment options, and is a complaint that carries with it a high risk of complications and morbidity.  The differential diagnosis includes closed head injury, fracture, hematoma, contusion, ICH, musculoskeletal injury  MDM: Patient seen in the emergency room as a level 2 trauma for fall  on blood thinners.  Primary survey unremarkable.  Secondary survey with tenderness over the right elbow, left elbow, right knee and right hip.  CT head and C-spine obtained negative for acute traumatic injury.  X-ray imaging overall reassuring but there is what appears to be a new cavitary lesion in the chest on chest x-ray.  Laboratory evaluation with a hemoglobin of 8.8, CO2 17, BUN 29, creatinine 1.49, albumin 2.5.  At time of signout, patient pending CT chest to better characterize this cavitary lesion.  Please see provider signout for continuation of workup.   Additional history obtained:  -External records from outside source obtained and reviewed including: Chart review including previous notes, labs, imaging, consultation notes   Lab Tests: -I ordered, reviewed, and interpreted labs.   The pertinent results include:   Labs Reviewed  CBC WITH DIFFERENTIAL/PLATELET - Abnormal; Notable for the following components:      Result Value   RBC 3.02 (*)    Hemoglobin 8.8 (*)    HCT 28.1 (*)    RDW 19.6 (*)    Lymphs Abs 0.6 (*)    All other components within normal limits  I-STAT CHEM 8, ED - Abnormal; Notable for the following components:   BUN 30 (*)    Creatinine, Ser 1.40 (*)    Calcium, Ion 1.13 (*)    TCO2 18 (*)    Hemoglobin 9.2 (*)    HCT 27.0 (*)    All other components within normal limits  COMPREHENSIVE METABOLIC PANEL        Imaging Studies ordered: I ordered imaging studies including CT head, C-spine, x-ray chest, knee, elbow, hip I independently visualized and interpreted imaging. I agree with the radiologist interpretation   Medicines ordered and prescription drug management: No orders of the defined types were placed in this encounter.   -I have reviewed the patients home medicines and have made adjustments as needed  Critical interventions Trauma activation and evaluation   Cardiac Monitoring: The patient was maintained on a cardiac monitor.  I  personally viewed and interpreted the cardiac monitored which showed an underlying rhythm of: NSR  Social Determinants of Health:  Factors impacting patients care include: Lives in facility   Reevaluation: After the interventions noted above, I reevaluated the patient and found that they have :improved  Co morbidities that complicate the patient evaluation  Past Medical History:  Diagnosis Date   AF (atrial fibrillation) (HCC)    Arthritis    Cancer (HCC)    prostate   Dysrhythmia    af   Hyperlipemia    Hypertension    Metastatic bone cancer    from prostate   Wears glasses       Dispostion: I considered admission for this patient, and disposition pending completion of trauma imaging.  Please see provider signout for continuation of workup     Final Clinical Impression(s) / ED Diagnoses Final diagnoses:  None     @PCDICTATION @    Glendora Score, MD 07/17/22 2332

## 2022-07-17 NOTE — ED Notes (Signed)
Patient brought to CT and returned.

## 2022-07-17 NOTE — ED Notes (Signed)
PTAR called at 9:28a

## 2022-07-17 NOTE — Progress Notes (Signed)
Responded to spiritual care referral to support patient. Pt  in process of being discharged per nurse. Chaplain provided ministry of presence, spiritual and emotional support.  Chaplain available as needed.  Venida Jarvis, [place], [place], Pager [phone]

## 2022-07-18 DIAGNOSIS — Z466 Encounter for fitting and adjustment of urinary device: Secondary | ICD-10-CM | POA: Diagnosis not present

## 2022-07-18 DIAGNOSIS — I482 Chronic atrial fibrillation, unspecified: Secondary | ICD-10-CM | POA: Diagnosis not present

## 2022-07-18 DIAGNOSIS — R338 Other retention of urine: Secondary | ICD-10-CM | POA: Diagnosis not present

## 2022-07-18 DIAGNOSIS — I10 Essential (primary) hypertension: Secondary | ICD-10-CM | POA: Diagnosis not present

## 2022-07-18 DIAGNOSIS — N401 Enlarged prostate with lower urinary tract symptoms: Secondary | ICD-10-CM | POA: Diagnosis not present

## 2022-07-18 DIAGNOSIS — I2510 Atherosclerotic heart disease of native coronary artery without angina pectoris: Secondary | ICD-10-CM | POA: Diagnosis not present

## 2022-07-19 DIAGNOSIS — R918 Other nonspecific abnormal finding of lung field: Secondary | ICD-10-CM | POA: Diagnosis not present

## 2022-07-21 ENCOUNTER — Inpatient Hospital Stay (HOSPITAL_COMMUNITY)
Admission: EM | Admit: 2022-07-21 | Discharge: 2022-07-22 | DRG: 085 | Disposition: A | Discharge status: Hospice | Payer: Medicare Other | Source: Skilled Nursing Facility | Attending: Neurology | Admitting: Neurology

## 2022-07-21 ENCOUNTER — Emergency Department (HOSPITAL_COMMUNITY): Payer: Medicare Other

## 2022-07-21 ENCOUNTER — Other Ambulatory Visit: Payer: Self-pay

## 2022-07-21 ENCOUNTER — Emergency Department (HOSPITAL_COMMUNITY)
Admission: EM | Admit: 2022-07-21 | Discharge: 2022-07-21 | Disposition: A | Discharge status: Skilled Nursing Facility | Payer: Medicare Other | Source: Home / Self Care | Attending: Emergency Medicine | Admitting: Emergency Medicine

## 2022-07-21 ENCOUNTER — Encounter (HOSPITAL_COMMUNITY): Payer: Self-pay | Admitting: *Deleted

## 2022-07-21 DIAGNOSIS — F0390 Unspecified dementia without behavioral disturbance: Secondary | ICD-10-CM | POA: Insufficient documentation

## 2022-07-21 DIAGNOSIS — R531 Weakness: Secondary | ICD-10-CM | POA: Diagnosis not present

## 2022-07-21 DIAGNOSIS — I2510 Atherosclerotic heart disease of native coronary artery without angina pectoris: Secondary | ICD-10-CM | POA: Diagnosis not present

## 2022-07-21 DIAGNOSIS — S01112A Laceration without foreign body of left eyelid and periocular area, initial encounter: Secondary | ICD-10-CM | POA: Insufficient documentation

## 2022-07-21 DIAGNOSIS — C787 Secondary malignant neoplasm of liver and intrahepatic bile duct: Secondary | ICD-10-CM | POA: Diagnosis present

## 2022-07-21 DIAGNOSIS — Y92129 Unspecified place in nursing home as the place of occurrence of the external cause: Secondary | ICD-10-CM

## 2022-07-21 DIAGNOSIS — S06350A Traumatic hemorrhage of left cerebrum without loss of consciousness, initial encounter: Secondary | ICD-10-CM | POA: Diagnosis present

## 2022-07-21 DIAGNOSIS — Z7401 Bed confinement status: Secondary | ICD-10-CM | POA: Diagnosis not present

## 2022-07-21 DIAGNOSIS — J189 Pneumonia, unspecified organism: Secondary | ICD-10-CM

## 2022-07-21 DIAGNOSIS — S199XXA Unspecified injury of neck, initial encounter: Secondary | ICD-10-CM | POA: Diagnosis not present

## 2022-07-21 DIAGNOSIS — S51811A Laceration without foreign body of right forearm, initial encounter: Secondary | ICD-10-CM | POA: Diagnosis not present

## 2022-07-21 DIAGNOSIS — E876 Hypokalemia: Secondary | ICD-10-CM | POA: Insufficient documentation

## 2022-07-21 DIAGNOSIS — R54 Age-related physical debility: Secondary | ICD-10-CM | POA: Diagnosis present

## 2022-07-21 DIAGNOSIS — Z79899 Other long term (current) drug therapy: Secondary | ICD-10-CM | POA: Insufficient documentation

## 2022-07-21 DIAGNOSIS — R296 Repeated falls: Secondary | ICD-10-CM | POA: Diagnosis present

## 2022-07-21 DIAGNOSIS — J181 Lobar pneumonia, unspecified organism: Secondary | ICD-10-CM | POA: Insufficient documentation

## 2022-07-21 DIAGNOSIS — L989 Disorder of the skin and subcutaneous tissue, unspecified: Secondary | ICD-10-CM | POA: Diagnosis not present

## 2022-07-21 DIAGNOSIS — F0150 Vascular dementia without behavioral disturbance: Secondary | ICD-10-CM | POA: Diagnosis not present

## 2022-07-21 DIAGNOSIS — I619 Nontraumatic intracerebral hemorrhage, unspecified: Principal | ICD-10-CM | POA: Diagnosis present

## 2022-07-21 DIAGNOSIS — S3993XA Unspecified injury of pelvis, initial encounter: Secondary | ICD-10-CM | POA: Diagnosis not present

## 2022-07-21 DIAGNOSIS — Z85830 Personal history of malignant neoplasm of bone: Secondary | ICD-10-CM | POA: Insufficient documentation

## 2022-07-21 DIAGNOSIS — G9609 Other spinal cerebrospinal fluid leak: Secondary | ICD-10-CM | POA: Insufficient documentation

## 2022-07-21 DIAGNOSIS — R402421 Glasgow coma scale score 9-12, in the field [EMT or ambulance]: Secondary | ICD-10-CM | POA: Diagnosis not present

## 2022-07-21 DIAGNOSIS — E785 Hyperlipidemia, unspecified: Secondary | ICD-10-CM | POA: Diagnosis present

## 2022-07-21 DIAGNOSIS — Z8546 Personal history of malignant neoplasm of prostate: Secondary | ICD-10-CM | POA: Insufficient documentation

## 2022-07-21 DIAGNOSIS — S0990XA Unspecified injury of head, initial encounter: Secondary | ICD-10-CM | POA: Diagnosis not present

## 2022-07-21 DIAGNOSIS — F29 Unspecified psychosis not due to a substance or known physiological condition: Secondary | ICD-10-CM | POA: Diagnosis not present

## 2022-07-21 DIAGNOSIS — I610 Nontraumatic intracerebral hemorrhage in hemisphere, subcortical: Secondary | ICD-10-CM | POA: Diagnosis not present

## 2022-07-21 DIAGNOSIS — S065X0D Traumatic subdural hemorrhage without loss of consciousness, subsequent encounter: Secondary | ICD-10-CM | POA: Diagnosis not present

## 2022-07-21 DIAGNOSIS — S065X0A Traumatic subdural hemorrhage without loss of consciousness, initial encounter: Secondary | ICD-10-CM | POA: Diagnosis not present

## 2022-07-21 DIAGNOSIS — I4891 Unspecified atrial fibrillation: Secondary | ICD-10-CM | POA: Diagnosis present

## 2022-07-21 DIAGNOSIS — G9608 Other cranial cerebrospinal fluid leak: Secondary | ICD-10-CM | POA: Diagnosis not present

## 2022-07-21 DIAGNOSIS — G936 Cerebral edema: Secondary | ICD-10-CM | POA: Diagnosis present

## 2022-07-21 DIAGNOSIS — W19XXXA Unspecified fall, initial encounter: Secondary | ICD-10-CM | POA: Insufficient documentation

## 2022-07-21 DIAGNOSIS — S06890D Other specified intracranial injury without loss of consciousness, subsequent encounter: Secondary | ICD-10-CM | POA: Diagnosis not present

## 2022-07-21 DIAGNOSIS — S3991XA Unspecified injury of abdomen, initial encounter: Secondary | ICD-10-CM | POA: Diagnosis not present

## 2022-07-21 DIAGNOSIS — C801 Malignant (primary) neoplasm, unspecified: Secondary | ICD-10-CM | POA: Diagnosis not present

## 2022-07-21 DIAGNOSIS — J168 Pneumonia due to other specified infectious organisms: Secondary | ICD-10-CM | POA: Diagnosis not present

## 2022-07-21 DIAGNOSIS — Z66 Do not resuscitate: Secondary | ICD-10-CM | POA: Diagnosis present

## 2022-07-21 DIAGNOSIS — R404 Transient alteration of awareness: Secondary | ICD-10-CM | POA: Diagnosis not present

## 2022-07-21 DIAGNOSIS — C7951 Secondary malignant neoplasm of bone: Secondary | ICD-10-CM | POA: Diagnosis present

## 2022-07-21 DIAGNOSIS — Z515 Encounter for palliative care: Secondary | ICD-10-CM

## 2022-07-21 DIAGNOSIS — Z8249 Family history of ischemic heart disease and other diseases of the circulatory system: Secondary | ICD-10-CM

## 2022-07-21 DIAGNOSIS — R29724 NIHSS score 24: Secondary | ICD-10-CM | POA: Diagnosis present

## 2022-07-21 DIAGNOSIS — I10 Essential (primary) hypertension: Secondary | ICD-10-CM | POA: Diagnosis not present

## 2022-07-21 DIAGNOSIS — F03911 Unspecified dementia, unspecified severity, with agitation: Secondary | ICD-10-CM | POA: Diagnosis present

## 2022-07-21 DIAGNOSIS — Z043 Encounter for examination and observation following other accident: Secondary | ICD-10-CM | POA: Diagnosis not present

## 2022-07-21 DIAGNOSIS — I6200 Nontraumatic subdural hemorrhage, unspecified: Secondary | ICD-10-CM | POA: Diagnosis not present

## 2022-07-21 DIAGNOSIS — S066X0A Traumatic subarachnoid hemorrhage without loss of consciousness, initial encounter: Secondary | ICD-10-CM | POA: Diagnosis present

## 2022-07-21 DIAGNOSIS — D649 Anemia, unspecified: Secondary | ICD-10-CM | POA: Insufficient documentation

## 2022-07-21 DIAGNOSIS — M50222 Other cervical disc displacement at C5-C6 level: Secondary | ICD-10-CM | POA: Diagnosis not present

## 2022-07-21 DIAGNOSIS — R0902 Hypoxemia: Secondary | ICD-10-CM | POA: Diagnosis not present

## 2022-07-21 DIAGNOSIS — Z6820 Body mass index (BMI) 20.0-20.9, adult: Secondary | ICD-10-CM

## 2022-07-21 DIAGNOSIS — J984 Other disorders of lung: Secondary | ICD-10-CM | POA: Diagnosis not present

## 2022-07-21 DIAGNOSIS — J90 Pleural effusion, not elsewhere classified: Secondary | ICD-10-CM | POA: Diagnosis not present

## 2022-07-21 DIAGNOSIS — S299XXA Unspecified injury of thorax, initial encounter: Secondary | ICD-10-CM | POA: Diagnosis not present

## 2022-07-21 DIAGNOSIS — M47812 Spondylosis without myelopathy or radiculopathy, cervical region: Secondary | ICD-10-CM | POA: Diagnosis not present

## 2022-07-21 DIAGNOSIS — R0602 Shortness of breath: Secondary | ICD-10-CM | POA: Diagnosis not present

## 2022-07-21 LAB — COMPREHENSIVE METABOLIC PANEL WITH GFR
ALT: 31 U/L (ref 0–44)
AST: 47 U/L — ABNORMAL HIGH (ref 15–41)
Albumin: 2.5 g/dL — ABNORMAL LOW (ref 3.5–5.0)
Alkaline Phosphatase: 257 U/L — ABNORMAL HIGH (ref 38–126)
Anion gap: 12 (ref 5–15)
BUN: 30 mg/dL — ABNORMAL HIGH (ref 8–23)
CO2: 19 mmol/L — ABNORMAL LOW (ref 22–32)
Calcium: 8.3 mg/dL — ABNORMAL LOW (ref 8.9–10.3)
Chloride: 107 mmol/L (ref 98–111)
Creatinine, Ser: 1.42 mg/dL — ABNORMAL HIGH (ref 0.61–1.24)
GFR, Estimated: 47 mL/min — ABNORMAL LOW
Glucose, Bld: 125 mg/dL — ABNORMAL HIGH (ref 70–99)
Potassium: 3.4 mmol/L — ABNORMAL LOW (ref 3.5–5.1)
Sodium: 138 mmol/L (ref 135–145)
Total Bilirubin: 0.7 mg/dL (ref 0.3–1.2)
Total Protein: 6.5 g/dL (ref 6.5–8.1)

## 2022-07-21 LAB — BASIC METABOLIC PANEL
Anion gap: 11 (ref 5–15)
BUN: 31 mg/dL — ABNORMAL HIGH (ref 8–23)
CO2: 19 mmol/L — ABNORMAL LOW (ref 22–32)
Calcium: 8.1 mg/dL — ABNORMAL LOW (ref 8.9–10.3)
Chloride: 106 mmol/L (ref 98–111)
Creatinine, Ser: 1.59 mg/dL — ABNORMAL HIGH (ref 0.61–1.24)
GFR, Estimated: 41 mL/min — ABNORMAL LOW (ref >60)
Glucose, Bld: 162 mg/dL — ABNORMAL HIGH (ref 70–99)
Potassium: 3.7 mmol/L (ref 3.5–5.1)
Sodium: 136 mmol/L (ref 135–145)

## 2022-07-21 LAB — URINALYSIS, ROUTINE W REFLEX MICROSCOPIC
Bilirubin Urine: NEGATIVE
Glucose, UA: NEGATIVE mg/dL
Hgb urine dipstick: NEGATIVE
Ketones, ur: NEGATIVE mg/dL
Leukocytes,Ua: NEGATIVE
Nitrite: NEGATIVE
Protein, ur: 100 mg/dL — AB
Specific Gravity, Urine: 1.009 (ref 1.005–1.030)
pH: 7 (ref 5.0–8.0)

## 2022-07-21 LAB — CBC WITH DIFFERENTIAL/PLATELET
Abs Immature Granulocytes: 0.04 10*3/uL (ref 0.00–0.07)
Basophils Absolute: 0 10*3/uL (ref 0.0–0.1)
Basophils Relative: 0 %
Eosinophils Absolute: 0 10*3/uL (ref 0.0–0.5)
Eosinophils Relative: 0 %
HCT: 25.6 % — ABNORMAL LOW (ref 39.0–52.0)
Hemoglobin: 8.3 g/dL — ABNORMAL LOW (ref 13.0–17.0)
Immature Granulocytes: 1 %
Lymphocytes Relative: 9 %
Lymphs Abs: 0.8 10*3/uL (ref 0.7–4.0)
MCH: 29.5 pg (ref 26.0–34.0)
MCHC: 32.4 g/dL (ref 30.0–36.0)
MCV: 91.1 fL (ref 80.0–100.0)
Monocytes Absolute: 0.6 10*3/uL (ref 0.1–1.0)
Monocytes Relative: 7 %
Neutro Abs: 7.2 10*3/uL (ref 1.7–7.7)
Neutrophils Relative %: 83 %
Platelets: 165 10*3/uL (ref 150–400)
RBC: 2.81 MIL/uL — ABNORMAL LOW (ref 4.22–5.81)
RDW: 19.5 % — ABNORMAL HIGH (ref 11.5–15.5)
WBC: 8.6 10*3/uL (ref 4.0–10.5)
nRBC: 0 % (ref 0.0–0.2)

## 2022-07-21 LAB — CBC
HCT: 27.8 % — ABNORMAL LOW (ref 39.0–52.0)
Hemoglobin: 8.8 g/dL — ABNORMAL LOW (ref 13.0–17.0)
MCH: 28.7 pg (ref 26.0–34.0)
MCHC: 31.7 g/dL (ref 30.0–36.0)
MCV: 90.6 fL (ref 80.0–100.0)
Platelets: 206 K/uL (ref 150–400)
RBC: 3.07 MIL/uL — ABNORMAL LOW (ref 4.22–5.81)
RDW: 19.7 % — ABNORMAL HIGH (ref 11.5–15.5)
WBC: 14.2 K/uL — ABNORMAL HIGH (ref 4.0–10.5)
nRBC: 0 % (ref 0.0–0.2)

## 2022-07-21 MED ORDER — METOPROLOL TARTRATE 5 MG/5ML IV SOLN: 5.0000 mg | Freq: Four times a day (QID) | INTRAVENOUS | Status: DC | PRN

## 2022-07-21 MED ORDER — DOXYCYCLINE HYCLATE 100 MG PO CAPS: 100.0000 mg | ORAL_CAPSULE | Freq: Two times a day (BID) | ORAL | 0 refills | Status: DC

## 2022-07-21 MED ORDER — STROKE: EARLY STAGES OF RECOVERY BOOK
Freq: Once | Status: AC
  Filled 2022-07-21: qty 1

## 2022-07-21 MED ORDER — CHLORHEXIDINE GLUCONATE CLOTH 2 % EX PADS
6.0000 | MEDICATED_PAD | Freq: Every day | CUTANEOUS | Status: DC
  Administered 2022-07-22 (×2): 6 via TOPICAL

## 2022-07-21 MED ORDER — ACETAMINOPHEN 160 MG/5ML PO SOLN: 650.0000 mg | ORAL | Status: DC | PRN

## 2022-07-21 MED ORDER — PANTOPRAZOLE SODIUM 40 MG IV SOLR: 40.0000 mg | Freq: Every day | INTRAVENOUS | Status: DC

## 2022-07-21 MED ORDER — LABETALOL HCL 5 MG/ML IV SOLN
INTRAVENOUS | Status: AC
  Administered 2022-07-21: 10 mg
  Filled 2022-07-21: qty 4

## 2022-07-21 MED ORDER — SENNOSIDES-DOCUSATE SODIUM 8.6-50 MG PO TABS: 1.0000 | ORAL_TABLET | Freq: Two times a day (BID) | ORAL | Status: DC

## 2022-07-21 MED ORDER — ACETAMINOPHEN 325 MG PO TABS: 650.0000 mg | ORAL_TABLET | ORAL | Status: DC | PRN

## 2022-07-21 MED ORDER — LORAZEPAM 2 MG/ML IJ SOLN
1.0000 mg | Freq: Once | INTRAMUSCULAR | Status: AC
  Administered 2022-07-21: 1 mg via INTRAMUSCULAR
  Filled 2022-07-21: qty 1

## 2022-07-21 MED ORDER — IOHEXOL 350 MG/ML SOLN
75.0000 mL | Freq: Once | INTRAVENOUS | Status: AC | PRN
  Administered 2022-07-21: 75 mL via INTRAVENOUS

## 2022-07-21 MED ORDER — CLEVIDIPINE BUTYRATE 0.5 MG/ML IV EMUL
0.0000 mg/h | INTRAVENOUS | Status: DC
  Administered 2022-07-21: 2 mg/h via INTRAVENOUS
  Administered 2022-07-22: 8 mg/h via INTRAVENOUS
  Administered 2022-07-22: 6 mg/h via INTRAVENOUS
  Filled 2022-07-21: qty 100
  Filled 2022-07-21 (×2): qty 50

## 2022-07-21 MED ORDER — SODIUM CHLORIDE 0.9 % IV BOLUS: 1000.0000 mL | Freq: Once | INTRAVENOUS | Status: DC

## 2022-07-21 MED ORDER — HALOPERIDOL LACTATE 5 MG/ML IJ SOLN
2.0000 mg | Freq: Once | INTRAMUSCULAR | Status: AC
  Administered 2022-07-21: 2 mg via INTRAMUSCULAR
  Filled 2022-07-21: qty 1

## 2022-07-21 MED ORDER — SODIUM CHLORIDE 0.9 % IV SOLN: INTRAVENOUS | Status: DC

## 2022-07-21 MED ORDER — LORAZEPAM 2 MG/ML IJ SOLN
1.0000 mg | INTRAMUSCULAR | Status: AC | PRN
  Administered 2022-07-21 (×2): 1 mg via INTRAVENOUS
  Filled 2022-07-21 (×2): qty 1

## 2022-07-21 MED ORDER — ACETAMINOPHEN 650 MG RE SUPP
650.0000 mg | RECTAL | Status: DC | PRN
  Filled 2022-07-21: qty 1

## 2022-07-21 NOTE — ED Notes (Signed)
Patient transported to CT 

## 2022-07-21 NOTE — ED Triage Notes (Signed)
PT arrives via [facility] from [facility] after having fell yesterday and being seen in ED and having a fall again today after being discharged back to [facility].Larey Seat again today unwitnessed and has a new bruise on top of his right eye, skin tears on both legs.PT was givin 2.5 of halidol IM by EMS.

## 2022-07-21 NOTE — ED Notes (Signed)
Pt returned from CT °

## 2022-07-21 NOTE — H&P (Signed)
NEUROLOGY CONSULTATION NOTE   Date of service: July 21, 2022 Patient Name: Jacob Becker MRN:  [id number] DOB:  [birth year] _ _ _   _ __   _ __ _ _  __ __   _ __   __ _  History of Present Illness  Jacob Becker is a 89 y.o. male with PMH significant for afibb but not on AC due to multiple falls, advanced dementia and lives in a SNF, HTN, HLD, Cad, prostrate cancer in remission who presents with confusion and fall. Has been having multiple falls recently. This AM was seen in the ED for a fall but was talkative and did have periods of lucidity. Was sent again to the ED for a significant decline, confused, altered, agitated in the afternoon. Did have a fall today at SNF too. He had CT Head for this decline which demonstrated a left temporal lobe hemorrhage.  I spoke with her daughter Dondra Spry to clarify goals of care. She is struggling a little about how to proceed forward. Specially since he was talkative earlier in AM and now so confused. She does mention that they are planing to meet with [facility] to look into hospice options for the patient. Does recall instance where patient had asked her in the past to do everything but she thinks he would not have wanted to live the way he is right now.  He is not on any blood thinners and was taken off anticoagulation for multiple falls. Unclear if he hit his head and then had the ICH or if the hemorrhage led to the fall. He has had a progressive decline over the last few months and declined significantly today.  LKW: sometime this AM. mRS: 4 tNKASE: not offered, due to ICH Thrombectomy: not offered, due to ICH ICH score: 2(age, GCS of 11) NIHSS components Score: Comment  1a Level of Conscious 0[]  1[x]  2[]  3[]      1b LOC Questions 0[]  1[]  2[x]       1c LOC Commands 0[]  1[]  2[x]       2 Best Gaze 0[]  1[]  2[x]       3 Visual 0[x]  1[]  2[]  3[]      4 Facial Palsy 0[x]  1[]  2[]  3[]      5a Motor Arm - left 0[]  1[]  2[]  3[x]  4[]  UN[]    5b Motor Arm - Right 0[]   1[]  2[]  3[x]  4[]  UN[]    6a Motor Leg - Left 0[]  1[]  2[]  3[x]  4[]  UN[]    6b Motor Leg - Right 0[]  1[]  2[]  3[x]  4[]  UN[]    7 Limb Ataxia 0[x]  1[]  2[]  3[]  UN[]     8 Sensory 0[x]  1[]  2[]  UN[]      9 Best Language 0[]  1[]  2[]  3[x]      10 Dysarthria 0[]  1[]  2[x]  UN[]      11 Extinct. and Inattention 0[x]  1[]  2[]       TOTAL: 24      ROS   Unable to answer questions due to confusion and agitation.  Past History   Past Medical History:  Diagnosis Date   AF (atrial fibrillation) (HCC)    Arthritis    Cancer (HCC)    prostate   Dysrhythmia    af   Hyperlipemia    Hypertension    Metastatic bone cancer    from prostate   Wears glasses    Past Surgical History:  Procedure Laterality Date   CHEILECTOMY Right 10/07/2013   Procedure: RIGHT HALLUX CHEILECTOMY;  Surgeon: Toni Arthurs, MD;  Location: [facility];  Service: Orthopedics;  Laterality: Right;   COLONOSCOPY     CYSTOSCOPY PROSTATE W/ LASER  2012   turp   HERNIA REPAIR     rt and lt ing   KNEE ARTHROSCOPY  2005   right   TONSILLECTOMY     Family History  Problem Relation Age of Onset   Heart failure Father    Social History   Socioeconomic History   Marital status: Married    Spouse name: Not on file   Number of children: Not on file   Years of education: Not on file   Highest education level: Not on file  Occupational History   Not on file  Tobacco Use   Smoking status: Unknown   Smokeless tobacco: Never   Tobacco comments:    States no tobacco use  Vaping Use   Vaping Use: Never used  Substance and Sexual Activity   Alcohol use: Not Currently    Comment: daily   Drug use: No   Sexual activity: Not on file  Other Topics Concern   Not on file  Social History Narrative   ** Merged History Encounter **       Social Determinants of Health   Financial Resource Strain: Not on file  Food Insecurity: Not on file  Transportation Needs: Not on file  Physical Activity: Not on file  Stress: Not  on file  Social Connections: Not on file   No Known Allergies  Medications  (Not in a hospital admission)    Vitals   Vitals:   07/21/22 2200 07/21/22 2215 07/21/22 2218 07/21/22 2220  BP: (!) 160/81 (!) 165/84 (!) 148/70 139/64  Pulse: 73 84 78 72  Resp: 17 18 (!) 22 (!) 22  Temp:      TempSrc:      SpO2: 96% 96% 97% 96%  Weight:      Height:         Body mass index is 20.07 kg/m.  Physical Exam   General: old and frail appearing male, laying comfortably in bed; in restraints. HENT: Normal oropharynx and mucosa. Normal external appearance of ears and nose. Neck: Supple, no pain or tenderness  CV: No JVD. No peripheral edema.  Pulmonary: Symmetric Chest rise. Normal respiratory effor Abdomen: Soft to touch, non-tender.  Ext: No cyanosis, edema, or deformity  Skin: No rash. Normal palpation of skin. Several bruises on his face, head, hip and extremities likely from multiple falls.   Musculoskeletal: Normal digits and nails by inspection. No clubbing.   Neurologic Examination  Mental status/Cognition: drowsy, partially opens eyes after a lot of stimulation. Left eye opens more than right eye. Does not make eye contact. Nonsensical speech seems like mumbling.  Speech/language: Nonsensical speech seems like mumbling. Does not follow commands or identify objects. Cranial nerves:   CN II Pupils equal and reactive to light, unable to assess for VF deficit.   CN III,IV,VI EOMI intact to dolls eyes.   CN V Corneals intact BL   CN VII Symmetric facial grimace.   CN VIII Does not turn head towards speech   CN IX & X Unable to assess. Seems to be protecting his airway for now.   CN XI Head midline   CN XII midline tongue   Sensory/Motor:  Muscle bulk: poor, tone normal Spontaneously moving all extremities but no atigravity movements noted. Does withdraw to proximal pinch in all extremities.  Coordination/Complex Motor:  Unable to assess.  Labs   CBC:  Recent Labs   Lab 07/17/22 0613 07/21/22 0203 07/21/22 1939  WBC 7.6 8.6 14.2*  NEUTROABS 6.3 7.2  --   HGB 8.8* 8.3* 8.8*  HCT 28.1* 25.6* 27.8*  MCV 93.0 91.1 90.6  PLT 175 165 206    Basic Metabolic Panel:  Lab Results  Component Value Date   NA 136 07/21/2022   K 3.7 07/21/2022   CO2 19 (L) 07/21/2022   GLUCOSE 162 (H) 07/21/2022   BUN 31 (H) 07/21/2022   CREATININE 1.59 (H) 07/21/2022   CALCIUM 8.1 (L) 07/21/2022   GFRNONAA 41 (L) 07/21/2022   GFRAA 66 11/05/2018   Lipid Panel:  Lab Results  Component Value Date   LDLCALC 75 11/05/2018   HgbA1c: No results found for: "HGBA1C" Urine Drug Screen: No results found for: "LABOPIA", "COCAINSCRNUR", "LABBENZ", "AMPHETMU", "THCU", "LABBARB"  Alcohol Level     Component Value Date/Time   ETH <10 12/28/2021 2251    CT Head without contrast(Personally reviewed): Interval development of a left temporal parenchymal hemorrhage involving an area of at least 5 x 3.5 cm. Possible superimposed subarachnoid hemorrhage. Slightly limited evaluation due to motion artifact.  Repeat CT Head: Pending  Impression   JACARI IANNELLO is a 89 y.o. male ith PMH significant for afibb but not on AC due to multiple falls, advanced dementia and lives in a SNF, HTN, HLD, Cad, prostrate cancer in remission who presents with confusion and fall. Has been having multiple falls recently. This AM was seen in the ED for a fall but was talkative and did have periods of lucidity. Was sent again to the ED for a significant decline, confused, altered, agitated in the afternoon. Did have a fall today at SNF too. He had CT Head for this decline which demonstrated a left temporal lobe hemorrhage.  Family leaning towards comfort care but still struggling a bit given how rapid this decline has been today. Discussed with family and plan for no escalation of care overnight and involve palliative team in AM to help with goals of care discussion.  Recommendations  L  temporal ICH with SAH: - Admit to ICU - Stability scan in 6 hours or STAT with any neurological decline - Frequent neuro checks; q42min for 1 hour, then q1hour - No antiplatelets or anticoagulants due to ICH - SCD for DVT prophylaxis, pharmacological DVT ppx at 24 hours if ICH is stable - Blood pressure control with goal systolic 130 - 150, cleverplex and labetalol PRN - Risk factor modification - PT consult, OT consult, Speech consult. - Stroke team to follow  HTN: Hold home meds. SBP goal as above. PRN  HLD: - cont home statin  pAfibb: - not on AC due to falls. - PRN metoprolol for HR above 120.  ______________________________________________________________________  This patient is critically ill and at significant risk of neurological worsening, death and care requires constant monitoring of vital signs, hemodynamics,respiratory and cardiac monitoring, neurological assessment, discussion with family, other specialists and medical decision making of high complexity. I spent 50 minutes of neurocritical care time  in the care of  this patient. This was time spent independent of any time provided by nurse practitioner or PA.  Code status discussed with daughter in detail and patient is DNR and DNI. Verified allergies over phone and he is not allergies to anything that we know of.  Erick Blinks [facility] 07/21/2022  11:36 PM  Thank you for the opportunity to take part in the care of this patient. If you  have any further questions, please contact the neurology consultation attending.  Signed,  Erick Blinks [facility] _ _ _   _ __   _ __ _ _  __ __   _ __   __ _

## 2022-07-21 NOTE — ED Notes (Signed)
ED TO INPATIENT HANDOFF REPORT  ED Nurse Name and Phone #: [phone]  S Name/Age/Gender Jacob Becker 89 y.o. male Room/Bed: 030C/030C  Code Status   Code Status: DNR  Home/SNF/Other Home  Is this baseline? No   Triage Complete: Triage complete  Chief Complaint ICH (intracerebral hemorrhage) (HCC) [I61.9]  Triage Note PT arrives via [facility] from [facility] after having fell yesterday and being seen in ED and having a fall again today after being discharged back to [facility].Larey Seat again today unwitnessed and has a new bruise on top of his right eye, skin tears on both legs.PT was givin 2.5 of halidol IM by EMS.   Allergies No Known Allergies  Level of Care/Admitting Diagnosis ED Disposition     ED Disposition  Admit   Condition  --   Comment  Hospital Area: [facility] [[id number]]  Level of Care: ICU [6]  May admit patient to [facility] or [facility] if equivalent level of care is available:: No  Covid Evaluation: Asymptomatic - no recent exposure (last 10 days) testing not required  Diagnosis: ICH (intracerebral hemorrhage) [facility]) [[id number]]  Admitting Physician: Erick Blinks [[id number]]  Attending Physician: Erick Blinks [[id number]]  Certification:: I certify this patient will need inpatient services for at least 2 midnights  Estimated Length of Stay: 5          B Medical/Surgery History Past Medical History:  Diagnosis Date   AF (atrial fibrillation) (HCC)    Arthritis    Cancer (HCC)    prostate   Dysrhythmia    af   Hyperlipemia    Hypertension    Metastatic bone cancer    from prostate   Wears glasses    Past Surgical History:  Procedure Laterality Date   CHEILECTOMY Right 10/07/2013   Procedure: RIGHT HALLUX CHEILECTOMY;  Surgeon: Toni Arthurs, MD;  Location: [facility];  Service: Orthopedics;  Laterality: Right;   COLONOSCOPY     CYSTOSCOPY PROSTATE W/ LASER  2012   turp   HERNIA REPAIR     rt and  lt ing   KNEE ARTHROSCOPY  2005   right   TONSILLECTOMY       A IV Location/Drains/Wounds Patient Lines/Drains/Airways Status     Active Line/Drains/Airways     Name Placement date Placement time Site Days   Peripheral IV 07/21/22 20 G Anterior;Left;Proximal Forearm 07/21/22  1940  Forearm  less than 1   Chest Tube Right 12/28/21  1400  --  205   Urethral Catheter Dr. Cristal Deer Winter Straight-tip 12 Fr. 12/29/21  1343  Straight-tip  204   Wound / Incision (Open or Dehisced) 01/04/22 Elbow Left;Posterior abrasion/needle aspiration site 01/04/22  1800  Elbow  198            Intake/Output Last 24 hours No intake or output data in the 24 hours ending 07/21/22 2257  Labs/Imaging Results for orders placed or performed during the hospital encounter of 07/21/22 (from the past 48 hour(s))  CBC     Status: Abnormal   Collection Time: 07/21/22  7:39 PM  Result Value Ref Range   WBC 14.2 (H) 4.0 - 10.5 K/uL   RBC 3.07 (L) 4.22 - 5.81 MIL/uL   Hemoglobin 8.8 (L) 13.0 - 17.0 g/dL   HCT 46.9 (L) 62.9 - 52.8 %   MCV 90.6 80.0 - 100.0 fL   MCH 28.7 26.0 - 34.0 pg   MCHC 31.7 30.0 - 36.0 g/dL   RDW  19.7 (H) 11.5 - 15.5 %   Platelets 206 150 - 400 K/uL   nRBC 0.0 0.0 - 0.2 %    Comment: Performed at [facility], [address] [place], [place] [address]  Basic metabolic panel     Status: Abnormal   Collection Time: 07/21/22  7:39 PM  Result Value Ref Range   Sodium 136 135 - 145 mmol/L   Potassium 3.7 3.5 - 5.1 mmol/L   Chloride 106 98 - 111 mmol/L   CO2 19 (L) 22 - 32 mmol/L   Glucose, Bld 162 (H) 70 - 99 mg/dL    Comment: Glucose reference range applies only to samples taken after fasting for at least 8 hours.   BUN 31 (H) 8 - 23 mg/dL   Creatinine, Ser 6.04 (H) 0.61 - 1.24 mg/dL   Calcium 8.1 (L) 8.9 - 10.3 mg/dL   GFR, Estimated 41 (L) >60 mL/min    Comment: (NOTE) Calculated using the CKD-EPI Creatinine Equation (2021)    Anion gap 11 5 - 15    Comment:  Performed at [facility], [address], [place], [place] [address]   DG Chest Portable 1 View  Result Date: 07/21/2022 CLINICAL DATA:  Unwitnessed fall. EXAM: PORTABLE CHEST 1 VIEW COMPARISON:  July 17, 2022 FINDINGS: The cardiac silhouette is mildly enlarged and unchanged in size. Moderate to marked severity calcification of the aortic arch is noted. Stable appearing areas of moderate to marked severity airspace disease are seen within the bilateral lower lobes. No pleural effusion or pneumothorax is identified. Multilevel degenerative changes seen throughout the thoracic spine. IMPRESSION: Moderate to marked severity bilateral lower lobe airspace disease. Electronically Signed   By: Aram Candela M.D.   On: 07/21/2022 21:47   CT Cervical Spine Wo Contrast  Result Date: 07/21/2022 CLINICAL DATA:  Neck trauma. EXAM: CT CERVICAL SPINE WITHOUT CONTRAST TECHNIQUE: Multidetector CT imaging of the cervical spine was performed without intravenous contrast. Multiplanar CT image reconstructions were also generated. RADIATION DOSE REDUCTION: This exam was performed according to the departmental dose-optimization program which includes automated exposure control, adjustment of the mA and/or kV according to patient size and/or use of iterative reconstruction technique. COMPARISON:  07/21/2022. FINDINGS: Alignment: There is mild anterolisthesis at C4-C5 and retrolisthesis at C5-C6 at C6-C7. There is anterolisthesis at C7-T1. Skull base and vertebrae: No acute fracture. Multifocal areas of sclerosis are noted throughout the cervical and thoracic spine as well as ribs. Soft tissues and spinal canal: No prevertebral fluid or swelling. No visible canal hematoma. Disc levels: Multilevel intervertebral disc space narrowing, degenerative endplate changes, and facet arthropathy. Upper chest: There are small bilateral pleural effusions with atelectasis. Other: Carotid artery calcifications.  Aortic atherosclerosis.  IMPRESSION: 1. No acute fracture. 2. Scattered sclerotic lesions throughout the bones, compatible with known osteoblastic metastasis. 3. Multilevel degenerative changes. 4. Small bilateral pleural effusions with atelectasis. Electronically Signed   By: Thornell Sartorius M.D.   On: 07/21/2022 20:36   CT Head Wo Contrast  Result Date: 07/21/2022 CLINICAL DATA:  Head trauma, moderate-severe EXAM: CT HEAD WITHOUT CONTRAST TECHNIQUE: Contiguous axial images were obtained from the base of the skull through the vertex without intravenous contrast. RADIATION DOSE REDUCTION: This exam was performed according to the departmental dose-optimization program which includes automated exposure control, adjustment of the mA and/or kV according to patient size and/or use of iterative reconstruction technique. COMPARISON:  CT head 623 20:44 a.m. FINDINGS: Brain: Motion artifact. Cerebral ventricle sizes are concordant  with the degree of cerebral volume loss. Patchy and confluent areas of decreased attenuation are noted throughout the deep and periventricular white matter of the cerebral hemispheres bilaterally, compatible with chronic microvascular ischemic disease. No evidence of large-territorial acute infarction. Interval development of a left temporal parenchymal hemorrhage involving an area of at least 5 x 3.5 cm. Possible superimposed subarachnoid hemorrhage. No mass lesion. No mass effect or midline shift. No hydrocephalus. Basilar cisterns are patent. Vascular: No hyperdense vessel. Skull: No acute fracture or focal lesion. Sinuses/Orbits: Paranasal sinuses and mastoid air cells are clear. Bilateral lens replacement. Otherwise the orbits are unremarkable. Other: None. IMPRESSION: Interval development of a left temporal parenchymal hemorrhage involving an area of at least 5 x 3.5 cm. Possible superimposed subarachnoid hemorrhage. Slightly limited evaluation due to motion artifact. These results were called by telephone at the  time of interpretation on 07/21/2022 at 8:27 pm to provider [facility] , who verbally acknowledged these results. Electronically Signed   By: Tish Frederickson M.D.   On: 07/21/2022 20:27   CT CHEST ABDOMEN PELVIS W CONTRAST  Result Date: 07/21/2022 CLINICAL DATA:  Found down.  Multiple bruises.  Head trauma. EXAM: CT CHEST, ABDOMEN, AND PELVIS WITH CONTRAST TECHNIQUE: Multidetector CT imaging of the chest, abdomen and pelvis was performed following the standard protocol during bolus administration of intravenous contrast. RADIATION DOSE REDUCTION: This exam was performed according to the departmental dose-optimization program which includes automated exposure control, adjustment of the mA and/or kV according to patient size and/or use of iterative reconstruction technique. CONTRAST:  75mL OMNIPAQUE IOHEXOL 350 MG/ML SOLN COMPARISON:  07/17/2022 chest CT. Chest abdomen pelvis CT 12/28/2021 FINDINGS: CT CHEST FINDINGS Cardiovascular: Heart is enlarged. Coronary artery calcification is evident. Mild atherosclerotic calcification is noted in the wall of the thoracic aorta. Mediastinum/Nodes: No mediastinal lymphadenopathy. There is no hilar lymphadenopathy. The esophagus has normal imaging features. There is no axillary lymphadenopathy. Lungs/Pleura: Collapse/consolidative opacity is seen in both lower lobes, right greater than left with small bilateral pleural effusions. Musculoskeletal: As before there are multiple sclerotic bone lesions involving left clavicle, thoracic spine, and ribs. CT ABDOMEN PELVIS FINDINGS Hepatobiliary: Numerous lever lesions of varying size and attenuation including a dominant 11.6 cm lesion in the dome of the liver, similar to a study from 08/07/2016. Other smaller scattered lesions approach water attenuation and are likely cysts. There is no evidence for gallstones, gallbladder wall thickening, or pericholecystic fluid. No intrahepatic or extrahepatic biliary dilation. Pancreas: Cystic  lesion in the head of the pancreas measures 2.8 cm, increased from 1.6 cm previously. No main duct dilatation. Spleen: No splenomegaly. No suspicious focal mass lesion. Adrenals/Urinary Tract: No adrenal nodule or mass. Stable cyst interpolar left kidney. Tiny hypoattenuating lesion in the lateral right kidney is too small to characterize but likely benign. No followup imaging is recommended. No evidence for hydroureter. Foley catheter identified in the bladder. Gas in the bladder lumen is compatible with the instrumentation. Stomach/Bowel: Stomach is unremarkable. No gastric wall thickening. No evidence of outlet obstruction. Duodenum is normally positioned as is the ligament of Treitz. No small bowel wall thickening. No small bowel dilatation. No gross colonic mass. No colonic wall thickening. Vascular/Lymphatic: There is advanced atherosclerotic calcification of the abdominal aorta without aneurysm. There is no gastrohepatic or hepatoduodenal ligament lymphadenopathy. No retroperitoneal or mesenteric lymphadenopathy. No pelvic sidewall lymphadenopathy. Reproductive: The prostate gland and seminal vesicles are unremarkable. Other: No intraperitoneal free fluid. Musculoskeletal: Diffuse body wall edema evident. Widespread sclerotic bone lesions again noted.  IMPRESSION: 1. No acute findings in the chest, abdomen, or pelvis. 2. Collapse/consolidative opacity in both lower lobes, right greater than left with small bilateral pleural effusions. Imaging features raise concern for pneumonia. 3. Numerous liver lesions of varying size and attenuation including a dominant 11.6 cm lesion in the dome of the liver, suspicious for soft tissue mass. Metastatic disease is a consideration. 4. Widespread sclerotic bone lesions compatible with metastatic disease. 5. 2.8 cm cystic lesion in the head of the pancreas, increased from 1.6 cm previously. Attention on follow-up recommended 6.  Aortic Atherosclerosis (ICD10-I70.0).  Electronically Signed   By: Kennith Center M.D.   On: 07/21/2022 05:32   CT Head Wo Contrast  Result Date: 07/21/2022 CLINICAL DATA:  Minor head trauma.  Unwitnessed fall EXAM: CT HEAD WITHOUT CONTRAST CT CERVICAL SPINE WITHOUT CONTRAST TECHNIQUE: Multidetector CT imaging of the head and cervical spine was performed following the standard protocol without intravenous contrast. Multiplanar CT image reconstructions of the cervical spine were also generated. RADIATION DOSE REDUCTION: This exam was performed according to the departmental dose-optimization program which includes automated exposure control, adjustment of the mA and/or kV according to patient size and/or use of iterative reconstruction technique. COMPARISON:  07/17/2022 FINDINGS: CT HEAD FINDINGS Brain: Low-density subdural collection along the bilateral cerebral convexity which are new from prior and measure up to 6 mm in thickness. No significant cerebral mass effect. Generalized cortical atrophy. No evidence of acute infarct, hydrocephalus, or mass. Vascular: No hyperdense vessel or unexpected calcification. Skull: Normal. Negative for fracture or focal lesion. Multiple scalp nodules without progression. Mild left parietal scalp swelling. Sinuses/Orbits: No evidence of injury CT CERVICAL SPINE FINDINGS Alignment: No traumatic malalignment Skull base and vertebrae: No acute fracture. Known osseous metastatic disease with dominant deposits in the C2, C7, T2, T3, and T4 vertebrae. Partially covered rib lesions as well. Soft tissues and spinal canal: No prevertebral fluid or swelling. No visible canal hematoma. Disc levels: Generalized degenerative endplate and facet spurring. Facet ankylosis has occurred at C2-C5. Upper chest: Clear apical lungs IMPRESSION: 1. Bilateral subdural hygroma since scan 4 days ago measuring up to 6 mm in thickness. No worrisome mass effect. 2. Negative for cervical spine fracture or subluxation. 3. Known osseous metastatic  disease. Electronically Signed   By: Tiburcio Pea M.D.   On: 07/21/2022 05:13   CT Cervical Spine Wo Contrast  Result Date: 07/21/2022 CLINICAL DATA:  Minor head trauma.  Unwitnessed fall EXAM: CT HEAD WITHOUT CONTRAST CT CERVICAL SPINE WITHOUT CONTRAST TECHNIQUE: Multidetector CT imaging of the head and cervical spine was performed following the standard protocol without intravenous contrast. Multiplanar CT image reconstructions of the cervical spine were also generated. RADIATION DOSE REDUCTION: This exam was performed according to the departmental dose-optimization program which includes automated exposure control, adjustment of the mA and/or kV according to patient size and/or use of iterative reconstruction technique. COMPARISON:  07/17/2022 FINDINGS: CT HEAD FINDINGS Brain: Low-density subdural collection along the bilateral cerebral convexity which are new from prior and measure up to 6 mm in thickness. No significant cerebral mass effect. Generalized cortical atrophy. No evidence of acute infarct, hydrocephalus, or mass. Vascular: No hyperdense vessel or unexpected calcification. Skull: Normal. Negative for fracture or focal lesion. Multiple scalp nodules without progression. Mild left parietal scalp swelling. Sinuses/Orbits: No evidence of injury CT CERVICAL SPINE FINDINGS Alignment: No traumatic malalignment Skull base and vertebrae: No acute fracture. Known osseous metastatic disease with dominant deposits in the C2, C7, T2, T3, and T4 vertebrae.  Partially covered rib lesions as well. Soft tissues and spinal canal: No prevertebral fluid or swelling. No visible canal hematoma. Disc levels: Generalized degenerative endplate and facet spurring. Facet ankylosis has occurred at C2-C5. Upper chest: Clear apical lungs IMPRESSION: 1. Bilateral subdural hygroma since scan 4 days ago measuring up to 6 mm in thickness. No worrisome mass effect. 2. Negative for cervical spine fracture or subluxation. 3. Known  osseous metastatic disease. Electronically Signed   By: Tiburcio Pea M.D.   On: 07/21/2022 05:13    Pending Labs Unresulted Labs (From admission, onward)    None       Vitals/Pain Today's Vitals   07/21/22 2200 07/21/22 2215 07/21/22 2218 07/21/22 2220  BP: (!) 160/81 (!) 165/84 (!) 148/70 139/64  Pulse: 73 84 78 72  Resp: 17 18 (!) 22 (!) 22  Temp:      TempSrc:      SpO2: 96% 96% 97% 96%  Weight:      Height:        Isolation Precautions No active isolations  Medications Medications  sodium chloride 0.9 % bolus 1,000 mL (has no administration in time range)  clevidipine (CLEVIPREX) infusion 0.5 mg/mL (4 mg/hr Intravenous Rate/Dose Change 07/21/22 2215)   stroke: early stages of recovery book (has no administration in time range)  acetaminophen (TYLENOL) tablet 650 mg (has no administration in time range)    Or  acetaminophen (TYLENOL) 160 MG/5ML solution 650 mg (has no administration in time range)    Or  acetaminophen (TYLENOL) suppository 650 mg (has no administration in time range)  senna-docusate (Senokot-S) tablet 1 tablet (has no administration in time range)  pantoprazole (PROTONIX) injection 40 mg (has no administration in time range)  0.9 %  sodium chloride infusion (has no administration in time range)  haloperidol lactate (HALDOL) injection 2 mg (2 mg Intramuscular Given 07/21/22 1800)  LORazepam (ATIVAN) injection 1 mg (1 mg Intramuscular Given 07/21/22 1801)  LORazepam (ATIVAN) injection 1 mg (1 mg Intravenous Given 07/21/22 2203)  labetalol (NORMODYNE) 5 MG/ML injection (10 mg  Given 07/21/22 2039)    Mobility non-ambulatory     Focused Assessments Neuro Assessment Handoff:  Swallow screen pass? No          Neuro Assessment:   Neuro Checks:      Has TPA been given? No If patient is a Neuro Trauma and patient is going to OR before floor call report to 4N Charge nurse: [phone] or [phone]   R Recommendations: See Admitting Provider  Note  Report given to:   Additional Notes:

## 2022-07-21 NOTE — ED Notes (Signed)
All bruises cleaned   hx if af

## 2022-07-21 NOTE — ED Triage Notes (Signed)
The pt  [facility]  says that nobody saw the pt fall although he has multiple multiple bruises lac over the lt eyebrow   his scalp has old bleeding areas rt forearm posterior fresh wound top layer of skin removedbleeding sl  all wounds cleaned  bot lower legs wrapped with dressing some weeping tiny wounds oozing liquid  a large bruised area lt flank  age undetermined  we changed his bed and clothes  he had a foley cath with no drainage bag attached inside his diaper  we attached a drainage bag but nothing has drained  at 0245  some red areas in the sacral area  we placed a  skin covering on that area  he arrived with a c-collar that the pt hates c/o cold 8 blankets  on the pt to try to keep him warm

## 2022-07-21 NOTE — ED Notes (Signed)
1st IV attempt and blood collection was unsuccessful due to pt being agitated and moving around.Will attempt again.

## 2022-07-21 NOTE — ED Provider Notes (Signed)
[place] EMERGENCY DEPARTMENT AT [facility] Provider Note   CSN: [id number] Arrival date & time: 07/21/22  0118     History  Chief Complaint  Patient presents with   multiple falls    Jacob Becker is a 89 y.o. male.  89 year old male brought in by EMS from [facility], memory care, for fall. Patient was found down by staff who did not have any further details, on EMS arrival, patient was in bed. History is complicated by dementia. Med list at bedside from facility does not include any blood thinners. Chart review with Xarelto on file but note that patient is no longer taking this. EMS reports minor wound to left side of face/left lateral eyebrow- no active bleeding. Skin tear to right forearm, ?acute.        Home Medications Prior to Admission medications   Medication Sig Start Date End Date Taking? Authorizing Provider  doxycycline (VIBRAMYCIN) 100 MG capsule Take 1 capsule (100 mg total) by mouth 2 (two) times daily. 07/21/22  Yes Jeannie Fend, PA-C  acetaminophen (TYLENOL) 500 MG tablet Take 2 tablets (1,000 mg total) by mouth 3 (three) times daily. Patient not taking: Reported on 02/01/2022 01/10/22   Glade Lloyd, MD  amLODipine (NORVASC) 10 MG tablet Take 0.5 tablets (5 mg total) by mouth daily. Patient not taking: Reported on 02/01/2022 01/10/22   Glade Lloyd, MD  amLODipine (NORVASC) 5 MG tablet Take 5 mg by mouth daily. 08/25/18   [provider]  Calcium Citrate 250 MG TABS Take 1 tablet by mouth 2 (two) times daily.    [provider]  Calcium-Magnesium-Vitamin D (CALCIUM 500 PO) Take 500 mg by mouth 2 (two) times daily. Patient not taking: Reported on 02/01/2022    [provider]  Coenzyme Q10 (CO Q 10) 100 MG CAPS Take 100 mg by mouth daily. Patient not taking: Reported on 02/01/2022    [provider]  Coenzyme Q10 (COQ10) 100 MG CAPS Take 1 capsule by mouth daily.    [provider]  colesevelam  (WELCHOL) 625 MG tablet Take 625 mg by mouth 2 (two) times daily with a meal. Patient not taking: Reported on 02/01/2022    [provider]  colesevelam (WELCHOL) 625 MG tablet Take 1,250 mg by mouth 2 (two) times daily. 12/25/21   [provider]  diphenhydramine-acetaminophen (TYLENOL PM) 25-500 MG TABS tablet Take 2 tablets by mouth at bedtime as needed. Patient not taking: Reported on 02/01/2022    [provider]  docusate sodium (COLACE) 100 MG capsule Take 1 capsule (100 mg total) by mouth 2 (two) times daily. 01/10/22   Glade Lloyd, MD  Ensure (ENSURE) Take 237 mLs by mouth daily as needed (as directed).    [provider]  enzalutamide Diana Eves) 80 MG tablet Take 160 mg by mouth daily.    [provider]  ferrous sulfate 325 (65 FE) MG tablet Take 1 tablet (325 mg total) by mouth daily with breakfast. Patient not taking: Reported on 02/01/2022 06/24/17   Lyn Records, MD  imiquimod (ALDARA) 5 % cream APPLY TO SCALP AND EAR NIGHTLY MONDAY THRU FRIDAY FOR 4 WEEKS Patient not taking: Reported on 02/01/2022 03/30/19   [provider]  lidocaine (LIDODERM) 5 % Place 1 patch onto the skin daily. Remove & Discard patch within 12 hours or as directed by MD 01/10/22   Glade Lloyd, MD  methocarbamol (ROBAXIN) 500 MG tablet Take 1 tablet (500 mg  total) by mouth every 6 (six) hours as needed for muscle spasms. 01/10/22   Glade Lloyd, MD  metoprolol succinate (TOPROL-XL) 100 MG 24 hr tablet Take 1 tablet (100mg ) every morning and 0.5 tablet (50mg ) every evening) Take with or immediately following a meal. Patient not taking: Reported on 02/01/2022 05/31/21   Lyn Records, MD  metoprolol succinate (TOPROL-XL) 100 MG 24 hr tablet Take 0.5 tablets (50 mg total) by mouth daily. 01/10/22   Glade Lloyd, MD  Multiple Vitamin (MULTIVITAMIN) capsule Take 1 capsule by mouth daily. Patient not taking: Reported on 02/01/2022    [provider]   nitroGLYCERIN (NITROSTAT) 0.4 MG SL tablet ONE TABLET UNDER TONGUE EVERY 5 MINUTES AS NEEDED FOR CHEST PAIN Patient not taking: Reported on 02/01/2022 12/25/15   Lyn Records, MD  NON FORMULARY Antifungal toenail solution from [facility], order faxed 09/08/2018 [facility] Patient not taking: Reported on 02/01/2022    [provider]  Omega-3 Fatty Acids (FISH OIL PO) Take 1,000 mg by mouth 2 (two) times daily.    [provider]  Omega-3 Fatty Acids (FISH OIL) 1000 MG CAPS Take 1 capsule by mouth in the morning and at bedtime. Patient not taking: Reported on 02/01/2022    [provider]  polyethylene glycol (MIRALAX / GLYCOLAX) 17 g packet Take 17 g by mouth daily as needed for mild constipation. 01/10/22   Glade Lloyd, MD  ramipril (ALTACE) 10 MG capsule Take 1 capsule (10 mg total) by mouth 2 (two) times daily. Patient not taking: Reported on 02/01/2022 08/01/21   Lyn Records, MD  rosuvastatin (CRESTOR) 40 MG tablet Take 40 mg by mouth daily. Patient not taking: Reported on 02/01/2022    [provider]  rosuvastatin (CRESTOR) 40 MG tablet Take 40 mg by mouth daily. 12/21/21   [provider]  traMADol (ULTRAM) 50 MG tablet Take 50 mg by mouth every 6 (six) hours as needed. 01/22/22   [provider]  triamcinolone (KENALOG) 0.1 % Apply topically 2 (two) times daily. Patient not taking: Reported on 02/01/2022 03/27/20   [provider]  triamterene-hydrochlorothiazide (DYAZIDE) 37.5-25 MG capsule TAKE ONE CAPSULE BY MOUTH EVERY DAY **NEED OFFICE VISIT** Patient not taking: Reported on 02/01/2022 07/24/21   Lyn Records, MD  XTANDI 40 MG tablet Take 160 mg by mouth at bedtime. Patient not taking: Reported on 02/01/2022 12/14/21   [provider]      Allergies    Patient has no known allergies.    Review of Systems   Review of Systems Level 5 caveat for dementia  Physical Exam Updated Vital Signs BP (!) 172/87    Pulse 69   Resp 16   Ht 6\' 2"  (1.88 m)   Wt 70.9 kg   SpO2 99%   BMI 20.07 kg/m  Physical Exam Vitals and nursing note reviewed.  Constitutional:      Interventions: Cervical collar in place.  HENT:     Head: Normocephalic.     Nose: Nose normal.     Mouth/Throat:     Mouth: Mucous membranes are moist.  Eyes:     Extraocular Movements: Extraocular movements intact.     Pupils: Pupils are equal, round, and reactive to light.  Cardiovascular:     Rate and Rhythm: Normal rate. Rhythm irregular.     Heart sounds: Normal heart sounds.  Pulmonary:     Breath sounds: Normal breath sounds.  Abdominal:     Palpations: Abdomen is  soft.     Tenderness: There is no abdominal tenderness.  Musculoskeletal:     Right lower leg: No edema.     Left lower leg: No edema.  Skin:    General: Skin is warm and dry.     Findings: Bruising present.  Neurological:     General: No focal deficit present.     Mental Status: He is alert.  Psychiatric:        Behavior: Behavior normal.              ED Results / Procedures / Treatments   Labs (all labs ordered are listed, but only abnormal results are displayed) Labs Reviewed  COMPREHENSIVE METABOLIC PANEL - Abnormal; Notable for the following components:      Result Value   Potassium 3.4 (*)    CO2 19 (*)    Glucose, Bld 125 (*)    BUN 30 (*)    Creatinine, Ser 1.42 (*)    Calcium 8.3 (*)    Albumin 2.5 (*)    AST 47 (*)    Alkaline Phosphatase 257 (*)    GFR, Estimated 47 (*)    All other components within normal limits  CBC WITH DIFFERENTIAL/PLATELET - Abnormal; Notable for the following components:   RBC 2.81 (*)    Hemoglobin 8.3 (*)    HCT 25.6 (*)    RDW 19.5 (*)    All other components within normal limits  URINALYSIS, ROUTINE W REFLEX MICROSCOPIC - Abnormal; Notable for the following components:   Protein, ur 100 (*)    Bacteria, UA RARE (*)    All other components within normal limits     EKG None  Radiology CT CHEST ABDOMEN PELVIS W CONTRAST  Result Date: 07/21/2022 CLINICAL DATA:  Found down.  Multiple bruises.  Head trauma. EXAM: CT CHEST, ABDOMEN, AND PELVIS WITH CONTRAST TECHNIQUE: Multidetector CT imaging of the chest, abdomen and pelvis was performed following the standard protocol during bolus administration of intravenous contrast. RADIATION DOSE REDUCTION: This exam was performed according to the departmental dose-optimization program which includes automated exposure control, adjustment of the mA and/or kV according to patient size and/or use of iterative reconstruction technique. CONTRAST:  75mL OMNIPAQUE IOHEXOL 350 MG/ML SOLN COMPARISON:  07/17/2022 chest CT. Chest abdomen pelvis CT 12/28/2021 FINDINGS: CT CHEST FINDINGS Cardiovascular: Heart is enlarged. Coronary artery calcification is evident. Mild atherosclerotic calcification is noted in the wall of the thoracic aorta. Mediastinum/Nodes: No mediastinal lymphadenopathy. There is no hilar lymphadenopathy. The esophagus has normal imaging features. There is no axillary lymphadenopathy. Lungs/Pleura: Collapse/consolidative opacity is seen in both lower lobes, right greater than left with small bilateral pleural effusions. Musculoskeletal: As before there are multiple sclerotic bone lesions involving left clavicle, thoracic spine, and ribs. CT ABDOMEN PELVIS FINDINGS Hepatobiliary: Numerous lever lesions of varying size and attenuation including a dominant 11.6 cm lesion in the dome of the liver, similar to a study from 08/07/2016. Other smaller scattered lesions approach water attenuation and are likely cysts. There is no evidence for gallstones, gallbladder wall thickening, or pericholecystic fluid. No intrahepatic or extrahepatic biliary dilation. Pancreas: Cystic lesion in the head of the pancreas measures 2.8 cm, increased from 1.6 cm previously. No main duct dilatation. Spleen: No splenomegaly. No suspicious focal mass  lesion. Adrenals/Urinary Tract: No adrenal nodule or mass. Stable cyst interpolar left kidney. Tiny hypoattenuating lesion in the lateral right kidney is too small to characterize but likely benign. No followup imaging is recommended. No evidence for  hydroureter. Foley catheter identified in the bladder. Gas in the bladder lumen is compatible with the instrumentation. Stomach/Bowel: Stomach is unremarkable. No gastric wall thickening. No evidence of outlet obstruction. Duodenum is normally positioned as is the ligament of Treitz. No small bowel wall thickening. No small bowel dilatation. No gross colonic mass. No colonic wall thickening. Vascular/Lymphatic: There is advanced atherosclerotic calcification of the abdominal aorta without aneurysm. There is no gastrohepatic or hepatoduodenal ligament lymphadenopathy. No retroperitoneal or mesenteric lymphadenopathy. No pelvic sidewall lymphadenopathy. Reproductive: The prostate gland and seminal vesicles are unremarkable. Other: No intraperitoneal free fluid. Musculoskeletal: Diffuse body wall edema evident. Widespread sclerotic bone lesions again noted. IMPRESSION: 1. No acute findings in the chest, abdomen, or pelvis. 2. Collapse/consolidative opacity in both lower lobes, right greater than left with small bilateral pleural effusions. Imaging features raise concern for pneumonia. 3. Numerous liver lesions of varying size and attenuation including a dominant 11.6 cm lesion in the dome of the liver, suspicious for soft tissue mass. Metastatic disease is a consideration. 4. Widespread sclerotic bone lesions compatible with metastatic disease. 5. 2.8 cm cystic lesion in the head of the pancreas, increased from 1.6 cm previously. Attention on follow-up recommended 6.  Aortic Atherosclerosis (ICD10-I70.0). Electronically Signed   By: Kennith Center M.D.   On: 07/21/2022 05:32   CT Head Wo Contrast  Result Date: 07/21/2022 CLINICAL DATA:  Minor head trauma.  Unwitnessed  fall EXAM: CT HEAD WITHOUT CONTRAST CT CERVICAL SPINE WITHOUT CONTRAST TECHNIQUE: Multidetector CT imaging of the head and cervical spine was performed following the standard protocol without intravenous contrast. Multiplanar CT image reconstructions of the cervical spine were also generated. RADIATION DOSE REDUCTION: This exam was performed according to the departmental dose-optimization program which includes automated exposure control, adjustment of the mA and/or kV according to patient size and/or use of iterative reconstruction technique. COMPARISON:  07/17/2022 FINDINGS: CT HEAD FINDINGS Brain: Low-density subdural collection along the bilateral cerebral convexity which are new from prior and measure up to 6 mm in thickness. No significant cerebral mass effect. Generalized cortical atrophy. No evidence of acute infarct, hydrocephalus, or mass. Vascular: No hyperdense vessel or unexpected calcification. Skull: Normal. Negative for fracture or focal lesion. Multiple scalp nodules without progression. Mild left parietal scalp swelling. Sinuses/Orbits: No evidence of injury CT CERVICAL SPINE FINDINGS Alignment: No traumatic malalignment Skull base and vertebrae: No acute fracture. Known osseous metastatic disease with dominant deposits in the C2, C7, T2, T3, and T4 vertebrae. Partially covered rib lesions as well. Soft tissues and spinal canal: No prevertebral fluid or swelling. No visible canal hematoma. Disc levels: Generalized degenerative endplate and facet spurring. Facet ankylosis has occurred at C2-C5. Upper chest: Clear apical lungs IMPRESSION: 1. Bilateral subdural hygroma since scan 4 days ago measuring up to 6 mm in thickness. No worrisome mass effect. 2. Negative for cervical spine fracture or subluxation. 3. Known osseous metastatic disease. Electronically Signed   By: Tiburcio Pea M.D.   On: 07/21/2022 05:13   CT Cervical Spine Wo Contrast  Result Date: 07/21/2022 CLINICAL DATA:  Minor head  trauma.  Unwitnessed fall EXAM: CT HEAD WITHOUT CONTRAST CT CERVICAL SPINE WITHOUT CONTRAST TECHNIQUE: Multidetector CT imaging of the head and cervical spine was performed following the standard protocol without intravenous contrast. Multiplanar CT image reconstructions of the cervical spine were also generated. RADIATION DOSE REDUCTION: This exam was performed according to the departmental dose-optimization program which includes automated exposure control, adjustment of the mA and/or kV according to patient size  and/or use of iterative reconstruction technique. COMPARISON:  07/17/2022 FINDINGS: CT HEAD FINDINGS Brain: Low-density subdural collection along the bilateral cerebral convexity which are new from prior and measure up to 6 mm in thickness. No significant cerebral mass effect. Generalized cortical atrophy. No evidence of acute infarct, hydrocephalus, or mass. Vascular: No hyperdense vessel or unexpected calcification. Skull: Normal. Negative for fracture or focal lesion. Multiple scalp nodules without progression. Mild left parietal scalp swelling. Sinuses/Orbits: No evidence of injury CT CERVICAL SPINE FINDINGS Alignment: No traumatic malalignment Skull base and vertebrae: No acute fracture. Known osseous metastatic disease with dominant deposits in the C2, C7, T2, T3, and T4 vertebrae. Partially covered rib lesions as well. Soft tissues and spinal canal: No prevertebral fluid or swelling. No visible canal hematoma. Disc levels: Generalized degenerative endplate and facet spurring. Facet ankylosis has occurred at C2-C5. Upper chest: Clear apical lungs IMPRESSION: 1. Bilateral subdural hygroma since scan 4 days ago measuring up to 6 mm in thickness. No worrisome mass effect. 2. Negative for cervical spine fracture or subluxation. 3. Known osseous metastatic disease. Electronically Signed   By: Tiburcio Pea M.D.   On: 07/21/2022 05:13    Procedures .Critical Care  Performed by: Jeannie Fend,  PA-C Authorized by: Jeannie Fend, PA-C   Critical care provider statement:    Critical care time (minutes):  30   Critical care was time spent personally by me on the following activities:  Development of treatment plan with patient or surrogate, discussions with consultants, evaluation of patient's response to treatment, examination of patient, ordering and review of laboratory studies, ordering and review of radiographic studies, ordering and performing treatments and interventions, pulse oximetry, re-evaluation of patient's condition and review of old charts     Medications Ordered in ED Medications  iohexol (OMNIPAQUE) 350 MG/ML injection 75 mL (75 mLs Intravenous Contrast Given 07/21/22 0458)    ED Course/ Medical Decision Making/ A&P                             Medical Decision Making Amount and/or Complexity of Data Reviewed Labs: ordered. Radiology: ordered.   This patient presents to the ED for concern of injuries from a fall, this involves an extensive number of treatment options, and is a complaint that carries with it a high risk of complications and morbidity.  The differential diagnosis includes skull fracture, intracranial injury, C-spine injury, trauma to the/abdomen/pelvis   Co morbidities that complicate the patient evaluation  A-fib, not on blood thinners.  CAD, hypertension, hyperlipidemia, prostate cancer, dementia, metastatic bone cancer.   Additional history obtained:  Additional history obtained from patient's daughter Dondra Spry available via phone call this morning who confirms patient is no longer on any blood thinners. Plans to meet with hospice in the coming week.  External records from outside source obtained and reviewed including prior labs on file for comparison   Lab Tests:  I Ordered, and personally interpreted labs.  The pertinent results include: CBC with mild hypokalemia with potassium of 3.4.  Creatinine is 1.42, not significantly changed from  prior on file.  Remaining chemistries reviewed number to prior without significant changes.  CBC with hemoglobin of 8.3, similar to prior.  Urinalysis is positive for protein and bacteria, negative for nitrites and leukocytes.   Imaging Studies ordered:  I ordered imaging studies including CT head, C-spine, chest/abdomen/pelvis I independently visualized and interpreted imaging which showed subdural bleed without shift I agree  with the radiologist interpretation   Consultations Obtained:  I requested consultation with the ER attending, Dr. Erin Hearing,  and discussed lab and imaging findings as well as pertinent plan - they recommend: Agree with plan of care. Case discussed with Meriel Flavors with neurosurgery who recommends repeat head CT in 6 hours.   Problem List / ED Course / Critical interventions / Medication management  89 year old male brought in by EMS from memory care facility where he was found down with a laceration to the lateral aspect of his left eyebrow and blood on his face.  Patient is aware that he lives in [place] and has family in town although unable to answer further questions, unsure why he fell.  He is able to follow simple commands such as moving air hands and feet.  He has bruising in various stages to his trunk and upper extremities related to his multiple falls which has been seen in the ER for.  CT head reveals subdural bleed, discussed with neurosurgery who recommends repeat head CT in 6 hours.  Discussed results with daughter, shared decision making, daughter would prefer to have patient discharged back to facility as she does not believe patient would want to have aggressive treatment if this bleeding were to worsen.  She does confirm that he is not on blood thinners.  Wound to the eyebrow does not require closure today.  CT chest concerning for pneumonia, will discharge with doxycycline. I have reviewed the patients home medicines and have made adjustments as  needed   Social Determinants of Health:  Lives at memory care facility, has family locally   Test / Admission - Considered:  Discharged to facility         Final Clinical Impression(s) / ED Diagnoses Final diagnoses:  Fall, initial encounter  Traumatic subdural hygroma  Pneumonia of both lungs due to infectious organism, unspecified part of lung    Rx / DC Orders ED Discharge Orders          Ordered    doxycycline (VIBRAMYCIN) 100 MG capsule  2 times daily        07/21/22 0621              Jeannie Fend, PA-C 07/21/22 [id number]    Marily Memos, MD 07/21/22 0745

## 2022-07-21 NOTE — ED Notes (Signed)
Attempted to call report to [facility].

## 2022-07-21 NOTE — ED Provider Notes (Signed)
[place] EMERGENCY DEPARTMENT AT [facility] Provider Note   CSN: [id number] Arrival date & time: 07/21/22  1706     History  Chief complaint: Fall.  Jacob Becker is a 89 y.o. male.  HPI   Patient has history of atrial fibrillation, hypertension, arthritis, metastatic bone cancer and dementia.  Patient is a resident of [facility] memory care.  Patient has been seen several times in the ED recently for falls.  He was seen on May 16 as well as June 19.  Patient was also seen in the ED earlier today for fall.  Patient was noted to to have multiple areas of bruising.  Patient had imaging test that showed bilateral subdural hygroma that was new since a previous CT scan 4 days ago.  Chest x-ray showed liver lesions concerning for metastases.  Patient also had widespread sclerotic bone disease consistent with metastases.  Patient's chest x-ray also showed possible consolidative processes.  Findings were discussed with patient's family and decision was to not repeat head CT and discharge back to the nursing facility. Daughter states that usually is able to speak in sentences.  She saw  the patient earlier this am and he was talking.  Pt fell again after she saw him earlier today.  Patient is unable to provide any history he is mumbling.  He keeps on trying to get up out of the bed.  Home Medications Prior to Admission medications   Medication Sig Start Date End Date Taking? Authorizing Provider  acetaminophen (TYLENOL) 500 MG tablet Take 2 tablets (1,000 mg total) by mouth 3 (three) times daily. Patient not taking: Reported on 02/01/2022 01/10/22   Glade Lloyd, MD  amLODipine (NORVASC) 10 MG tablet Take 0.5 tablets (5 mg total) by mouth daily. Patient not taking: Reported on 02/01/2022 01/10/22   Glade Lloyd, MD  amLODipine (NORVASC) 5 MG tablet Take 5 mg by mouth daily. 08/25/18   [provider]  Calcium Citrate 250 MG TABS Take 1 tablet by mouth 2 (two) times  daily.    [provider]  Calcium-Magnesium-Vitamin D (CALCIUM 500 PO) Take 500 mg by mouth 2 (two) times daily. Patient not taking: Reported on 02/01/2022    [provider]  Coenzyme Q10 (CO Q 10) 100 MG CAPS Take 100 mg by mouth daily. Patient not taking: Reported on 02/01/2022    [provider]  Coenzyme Q10 (COQ10) 100 MG CAPS Take 1 capsule by mouth daily.    [provider]  colesevelam (WELCHOL) 625 MG tablet Take 625 mg by mouth 2 (two) times daily with a meal. Patient not taking: Reported on 02/01/2022    [provider]  colesevelam (WELCHOL) 625 MG tablet Take 1,250 mg by mouth 2 (two) times daily. 12/25/21   [provider]  diphenhydramine-acetaminophen (TYLENOL PM) 25-500 MG TABS tablet Take 2 tablets by mouth at bedtime as needed. Patient not taking: Reported on 02/01/2022    [provider]  docusate sodium (COLACE) 100 MG capsule Take 1 capsule (100 mg total) by mouth 2 (two) times daily. 01/10/22   Glade Lloyd, MD  doxycycline (VIBRAMYCIN) 100 MG capsule Take 1 capsule (100 mg total) by mouth 2 (two) times daily. 07/21/22   Jeannie Fend, PA-C  Ensure (ENSURE) Take 237 mLs by mouth daily as needed (as directed).    [provider]  enzalutamide Diana Eves) 80 MG tablet Take 160 mg by mouth daily.    [provider]  ferrous sulfate  325 (65 FE) MG tablet Take 1 tablet (325 mg total) by mouth daily with breakfast. Patient not taking: Reported on 02/01/2022 06/24/17   Lyn Records, MD  imiquimod (ALDARA) 5 % cream APPLY TO SCALP AND EAR NIGHTLY MONDAY THRU FRIDAY FOR 4 WEEKS Patient not taking: Reported on 02/01/2022 03/30/19   [provider]  lidocaine (LIDODERM) 5 % Place 1 patch onto the skin daily. Remove & Discard patch within 12 hours or as directed by MD 01/10/22   Glade Lloyd, MD  methocarbamol (ROBAXIN) 500 MG tablet Take 1 tablet (500 mg total) by mouth every 6 (six) hours as needed for  muscle spasms. 01/10/22   Glade Lloyd, MD  metoprolol succinate (TOPROL-XL) 100 MG 24 hr tablet Take 1 tablet (100mg ) every morning and 0.5 tablet (50mg ) every evening) Take with or immediately following a meal. Patient not taking: Reported on 02/01/2022 05/31/21   Lyn Records, MD  metoprolol succinate (TOPROL-XL) 100 MG 24 hr tablet Take 0.5 tablets (50 mg total) by mouth daily. 01/10/22   Glade Lloyd, MD  Multiple Vitamin (MULTIVITAMIN) capsule Take 1 capsule by mouth daily. Patient not taking: Reported on 02/01/2022    [provider]  nitroGLYCERIN (NITROSTAT) 0.4 MG SL tablet ONE TABLET UNDER TONGUE EVERY 5 MINUTES AS NEEDED FOR CHEST PAIN Patient not taking: Reported on 02/01/2022 12/25/15   Lyn Records, MD  NON FORMULARY Antifungal toenail solution from [facility], order faxed 09/08/2018 [facility] Patient not taking: Reported on 02/01/2022    [provider]  Omega-3 Fatty Acids (FISH OIL PO) Take 1,000 mg by mouth 2 (two) times daily.    [provider]  Omega-3 Fatty Acids (FISH OIL) 1000 MG CAPS Take 1 capsule by mouth in the morning and at bedtime. Patient not taking: Reported on 02/01/2022    [provider]  polyethylene glycol (MIRALAX / GLYCOLAX) 17 g packet Take 17 g by mouth daily as needed for mild constipation. 01/10/22   Glade Lloyd, MD  ramipril (ALTACE) 10 MG capsule Take 1 capsule (10 mg total) by mouth 2 (two) times daily. Patient not taking: Reported on 02/01/2022 08/01/21   Lyn Records, MD  rosuvastatin (CRESTOR) 40 MG tablet Take 40 mg by mouth daily. Patient not taking: Reported on 02/01/2022    [provider]  rosuvastatin (CRESTOR) 40 MG tablet Take 40 mg by mouth daily. 12/21/21   [provider]  traMADol (ULTRAM) 50 MG tablet Take 50 mg by mouth every 6 (six) hours as needed. 01/22/22   [provider]  triamcinolone (KENALOG) 0.1 % Apply topically 2 (two) times daily. Patient not taking:  Reported on 02/01/2022 03/27/20   [provider]  triamterene-hydrochlorothiazide (DYAZIDE) 37.5-25 MG capsule TAKE ONE CAPSULE BY MOUTH EVERY DAY **NEED OFFICE VISIT** Patient not taking: Reported on 02/01/2022 07/24/21   Lyn Records, MD  XTANDI 40 MG tablet Take 160 mg by mouth at bedtime. Patient not taking: Reported on 02/01/2022 12/14/21   [provider]      Allergies    Patient has no known allergies.    Review of Systems   Review of Systems  Physical Exam Updated Vital Signs BP 127/71   Pulse 85   Temp 98 F (36.7 C) (Oral)   Resp (!) 21   Ht 1.88 m (6\' 2" )   Wt 70.9 kg   SpO2 93%   BMI 20.07 kg/m  Physical Exam Vitals and nursing note reviewed.  Constitutional:  Appearance: He is well-developed. He is ill-appearing.  HENT:     Head: Normocephalic.     Comments: Abrasions noted to the head    Right Ear: External ear normal.     Left Ear: External ear normal.  Eyes:     General: No scleral icterus.       Right eye: No discharge.        Left eye: No discharge.     Conjunctiva/sclera: Conjunctivae normal.  Neck:     Trachea: No tracheal deviation.  Cardiovascular:     Rate and Rhythm: Normal rate and regular rhythm.  Pulmonary:     Effort: Pulmonary effort is normal. No respiratory distress.     Breath sounds: Normal breath sounds. No stridor. No wheezing or rales.  Abdominal:     General: Bowel sounds are normal. There is no distension.     Palpations: Abdomen is soft.     Tenderness: There is no abdominal tenderness. There is no guarding or rebound.  Musculoskeletal:        General: No tenderness or deformity.     Cervical back: Neck supple.     Comments: Multiple areas of bruising, no focal areas of swelling or deformity  Skin:    General: Skin is warm and dry.     Findings: No rash.  Neurological:     General: No focal deficit present.     Mental Status: He is alert.     Cranial Nerves: No cranial nerve deficit, dysarthria or  facial asymmetry.     Sensory: No sensory deficit.     Motor: No abnormal muscle tone or seizure activity.     Coordination: Coordination normal.  Psychiatric:        Mood and Affect: Mood normal.     ED Results / Procedures / Treatments   Labs (all labs ordered are listed, but only abnormal results are displayed) Labs Reviewed  CBC - Abnormal; Notable for the following components:      Result Value   WBC 14.2 (*)    RBC 3.07 (*)    Hemoglobin 8.8 (*)    HCT 27.8 (*)    RDW 19.7 (*)    All other components within normal limits  BASIC METABOLIC PANEL - Abnormal; Notable for the following components:   CO2 19 (*)    Glucose, Bld 162 (*)    BUN 31 (*)    Creatinine, Ser 1.59 (*)    Calcium 8.1 (*)    GFR, Estimated 41 (*)    All other components within normal limits    EKG None  Radiology DG Chest Portable 1 View  Result Date: 07/21/2022 CLINICAL DATA:  Unwitnessed fall. EXAM: PORTABLE CHEST 1 VIEW COMPARISON:  July 17, 2022 FINDINGS: The cardiac silhouette is mildly enlarged and unchanged in size. Moderate to marked severity calcification of the aortic arch is noted. Stable appearing areas of moderate to marked severity airspace disease are seen within the bilateral lower lobes. No pleural effusion or pneumothorax is identified. Multilevel degenerative changes seen throughout the thoracic spine. IMPRESSION: Moderate to marked severity bilateral lower lobe airspace disease. Electronically Signed   By: Aram Candela M.D.   On: 07/21/2022 21:47   CT Cervical Spine Wo Contrast  Result Date: 07/21/2022 CLINICAL DATA:  Neck trauma. EXAM: CT CERVICAL SPINE WITHOUT CONTRAST TECHNIQUE: Multidetector CT imaging of the cervical spine was performed without intravenous contrast. Multiplanar CT image reconstructions were also generated. RADIATION DOSE REDUCTION: This exam was performed  according to the departmental dose-optimization program which includes automated exposure control,  adjustment of the mA and/or kV according to patient size and/or use of iterative reconstruction technique. COMPARISON:  07/21/2022. FINDINGS: Alignment: There is mild anterolisthesis at C4-C5 and retrolisthesis at C5-C6 at C6-C7. There is anterolisthesis at C7-T1. Skull base and vertebrae: No acute fracture. Multifocal areas of sclerosis are noted throughout the cervical and thoracic spine as well as ribs. Soft tissues and spinal canal: No prevertebral fluid or swelling. No visible canal hematoma. Disc levels: Multilevel intervertebral disc space narrowing, degenerative endplate changes, and facet arthropathy. Upper chest: There are small bilateral pleural effusions with atelectasis. Other: Carotid artery calcifications.  Aortic atherosclerosis. IMPRESSION: 1. No acute fracture. 2. Scattered sclerotic lesions throughout the bones, compatible with known osteoblastic metastasis. 3. Multilevel degenerative changes. 4. Small bilateral pleural effusions with atelectasis. Electronically Signed   By: Thornell Sartorius M.D.   On: 07/21/2022 20:36   CT Head Wo Contrast  Result Date: 07/21/2022 CLINICAL DATA:  Head trauma, moderate-severe EXAM: CT HEAD WITHOUT CONTRAST TECHNIQUE: Contiguous axial images were obtained from the base of the skull through the vertex without intravenous contrast. RADIATION DOSE REDUCTION: This exam was performed according to the departmental dose-optimization program which includes automated exposure control, adjustment of the mA and/or kV according to patient size and/or use of iterative reconstruction technique. COMPARISON:  CT head 623 20:44 a.m. FINDINGS: Brain: Motion artifact. Cerebral ventricle sizes are concordant with the degree of cerebral volume loss. Patchy and confluent areas of decreased attenuation are noted throughout the deep and periventricular white matter of the cerebral hemispheres bilaterally, compatible with chronic microvascular ischemic disease. No evidence of  large-territorial acute infarction. Interval development of a left temporal parenchymal hemorrhage involving an area of at least 5 x 3.5 cm. Possible superimposed subarachnoid hemorrhage. No mass lesion. No mass effect or midline shift. No hydrocephalus. Basilar cisterns are patent. Vascular: No hyperdense vessel. Skull: No acute fracture or focal lesion. Sinuses/Orbits: Paranasal sinuses and mastoid air cells are clear. Bilateral lens replacement. Otherwise the orbits are unremarkable. Other: None. IMPRESSION: Interval development of a left temporal parenchymal hemorrhage involving an area of at least 5 x 3.5 cm. Possible superimposed subarachnoid hemorrhage. Slightly limited evaluation due to motion artifact. These results were called by telephone at the time of interpretation on 07/21/2022 at 8:27 pm to provider [facility] , who verbally acknowledged these results. Electronically Signed   By: Tish Frederickson M.D.   On: 07/21/2022 20:27   CT CHEST ABDOMEN PELVIS W CONTRAST  Result Date: 07/21/2022 CLINICAL DATA:  Found down.  Multiple bruises.  Head trauma. EXAM: CT CHEST, ABDOMEN, AND PELVIS WITH CONTRAST TECHNIQUE: Multidetector CT imaging of the chest, abdomen and pelvis was performed following the standard protocol during bolus administration of intravenous contrast. RADIATION DOSE REDUCTION: This exam was performed according to the departmental dose-optimization program which includes automated exposure control, adjustment of the mA and/or kV according to patient size and/or use of iterative reconstruction technique. CONTRAST:  75mL OMNIPAQUE IOHEXOL 350 MG/ML SOLN COMPARISON:  07/17/2022 chest CT. Chest abdomen pelvis CT 12/28/2021 FINDINGS: CT CHEST FINDINGS Cardiovascular: Heart is enlarged. Coronary artery calcification is evident. Mild atherosclerotic calcification is noted in the wall of the thoracic aorta. Mediastinum/Nodes: No mediastinal lymphadenopathy. There is no hilar lymphadenopathy. The  esophagus has normal imaging features. There is no axillary lymphadenopathy. Lungs/Pleura: Collapse/consolidative opacity is seen in both lower lobes, right greater than left with small bilateral pleural effusions. Musculoskeletal: As before there are  multiple sclerotic bone lesions involving left clavicle, thoracic spine, and ribs. CT ABDOMEN PELVIS FINDINGS Hepatobiliary: Numerous lever lesions of varying size and attenuation including a dominant 11.6 cm lesion in the dome of the liver, similar to a study from 08/07/2016. Other smaller scattered lesions approach water attenuation and are likely cysts. There is no evidence for gallstones, gallbladder wall thickening, or pericholecystic fluid. No intrahepatic or extrahepatic biliary dilation. Pancreas: Cystic lesion in the head of the pancreas measures 2.8 cm, increased from 1.6 cm previously. No main duct dilatation. Spleen: No splenomegaly. No suspicious focal mass lesion. Adrenals/Urinary Tract: No adrenal nodule or mass. Stable cyst interpolar left kidney. Tiny hypoattenuating lesion in the lateral right kidney is too small to characterize but likely benign. No followup imaging is recommended. No evidence for hydroureter. Foley catheter identified in the bladder. Gas in the bladder lumen is compatible with the instrumentation. Stomach/Bowel: Stomach is unremarkable. No gastric wall thickening. No evidence of outlet obstruction. Duodenum is normally positioned as is the ligament of Treitz. No small bowel wall thickening. No small bowel dilatation. No gross colonic mass. No colonic wall thickening. Vascular/Lymphatic: There is advanced atherosclerotic calcification of the abdominal aorta without aneurysm. There is no gastrohepatic or hepatoduodenal ligament lymphadenopathy. No retroperitoneal or mesenteric lymphadenopathy. No pelvic sidewall lymphadenopathy. Reproductive: The prostate gland and seminal vesicles are unremarkable. Other: No intraperitoneal free  fluid. Musculoskeletal: Diffuse body wall edema evident. Widespread sclerotic bone lesions again noted. IMPRESSION: 1. No acute findings in the chest, abdomen, or pelvis. 2. Collapse/consolidative opacity in both lower lobes, right greater than left with small bilateral pleural effusions. Imaging features raise concern for pneumonia. 3. Numerous liver lesions of varying size and attenuation including a dominant 11.6 cm lesion in the dome of the liver, suspicious for soft tissue mass. Metastatic disease is a consideration. 4. Widespread sclerotic bone lesions compatible with metastatic disease. 5. 2.8 cm cystic lesion in the head of the pancreas, increased from 1.6 cm previously. Attention on follow-up recommended 6.  Aortic Atherosclerosis (ICD10-I70.0). Electronically Signed   By: Kennith Center M.D.   On: 07/21/2022 05:32   CT Head Wo Contrast  Result Date: 07/21/2022 CLINICAL DATA:  Minor head trauma.  Unwitnessed fall EXAM: CT HEAD WITHOUT CONTRAST CT CERVICAL SPINE WITHOUT CONTRAST TECHNIQUE: Multidetector CT imaging of the head and cervical spine was performed following the standard protocol without intravenous contrast. Multiplanar CT image reconstructions of the cervical spine were also generated. RADIATION DOSE REDUCTION: This exam was performed according to the departmental dose-optimization program which includes automated exposure control, adjustment of the mA and/or kV according to patient size and/or use of iterative reconstruction technique. COMPARISON:  07/17/2022 FINDINGS: CT HEAD FINDINGS Brain: Low-density subdural collection along the bilateral cerebral convexity which are new from prior and measure up to 6 mm in thickness. No significant cerebral mass effect. Generalized cortical atrophy. No evidence of acute infarct, hydrocephalus, or mass. Vascular: No hyperdense vessel or unexpected calcification. Skull: Normal. Negative for fracture or focal lesion. Multiple scalp nodules without  progression. Mild left parietal scalp swelling. Sinuses/Orbits: No evidence of injury CT CERVICAL SPINE FINDINGS Alignment: No traumatic malalignment Skull base and vertebrae: No acute fracture. Known osseous metastatic disease with dominant deposits in the C2, C7, T2, T3, and T4 vertebrae. Partially covered rib lesions as well. Soft tissues and spinal canal: No prevertebral fluid or swelling. No visible canal hematoma. Disc levels: Generalized degenerative endplate and facet spurring. Facet ankylosis has occurred at C2-C5. Upper chest: Clear apical lungs  IMPRESSION: 1. Bilateral subdural hygroma since scan 4 days ago measuring up to 6 mm in thickness. No worrisome mass effect. 2. Negative for cervical spine fracture or subluxation. 3. Known osseous metastatic disease. Electronically Signed   By: Tiburcio Pea M.D.   On: 07/21/2022 05:13   CT Cervical Spine Wo Contrast  Result Date: 07/21/2022 CLINICAL DATA:  Minor head trauma.  Unwitnessed fall EXAM: CT HEAD WITHOUT CONTRAST CT CERVICAL SPINE WITHOUT CONTRAST TECHNIQUE: Multidetector CT imaging of the head and cervical spine was performed following the standard protocol without intravenous contrast. Multiplanar CT image reconstructions of the cervical spine were also generated. RADIATION DOSE REDUCTION: This exam was performed according to the departmental dose-optimization program which includes automated exposure control, adjustment of the mA and/or kV according to patient size and/or use of iterative reconstruction technique. COMPARISON:  07/17/2022 FINDINGS: CT HEAD FINDINGS Brain: Low-density subdural collection along the bilateral cerebral convexity which are new from prior and measure up to 6 mm in thickness. No significant cerebral mass effect. Generalized cortical atrophy. No evidence of acute infarct, hydrocephalus, or mass. Vascular: No hyperdense vessel or unexpected calcification. Skull: Normal. Negative for fracture or focal lesion. Multiple  scalp nodules without progression. Mild left parietal scalp swelling. Sinuses/Orbits: No evidence of injury CT CERVICAL SPINE FINDINGS Alignment: No traumatic malalignment Skull base and vertebrae: No acute fracture. Known osseous metastatic disease with dominant deposits in the C2, C7, T2, T3, and T4 vertebrae. Partially covered rib lesions as well. Soft tissues and spinal canal: No prevertebral fluid or swelling. No visible canal hematoma. Disc levels: Generalized degenerative endplate and facet spurring. Facet ankylosis has occurred at C2-C5. Upper chest: Clear apical lungs IMPRESSION: 1. Bilateral subdural hygroma since scan 4 days ago measuring up to 6 mm in thickness. No worrisome mass effect. 2. Negative for cervical spine fracture or subluxation. 3. Known osseous metastatic disease. Electronically Signed   By: Tiburcio Pea M.D.   On: 07/21/2022 05:13    Procedures .Critical Care  Performed by: Linwood Dibbles, MD Authorized by: Linwood Dibbles, MD   Critical care provider statement:    Critical care time (minutes):  30   Critical care was time spent personally by me on the following activities:  Development of treatment plan with patient or surrogate, discussions with consultants, evaluation of patient's response to treatment, examination of patient, ordering and review of laboratory studies, ordering and review of radiographic studies, ordering and performing treatments and interventions, pulse oximetry, re-evaluation of patient's condition and review of old charts     Medications Ordered in ED Medications  sodium chloride 0.9 % bolus 1,000 mL (has no administration in time range)  clevidipine (CLEVIPREX) infusion 0.5 mg/mL (4 mg/hr Intravenous Rate/Dose Change 07/21/22 2215)   stroke: early stages of recovery book (has no administration in time range)  acetaminophen (TYLENOL) tablet 650 mg (has no administration in time range)    Or  acetaminophen (TYLENOL) 160 MG/5ML solution 650 mg (has no  administration in time range)    Or  acetaminophen (TYLENOL) suppository 650 mg (has no administration in time range)  senna-docusate (Senokot-S) tablet 1 tablet (has no administration in time range)  pantoprazole (PROTONIX) injection 40 mg (has no administration in time range)  0.9 %  sodium chloride infusion (has no administration in time range)  metoprolol tartrate (LOPRESSOR) injection 5 mg (has no administration in time range)  haloperidol lactate (HALDOL) injection 2 mg (2 mg Intramuscular Given 07/21/22 1800)  LORazepam (ATIVAN) injection 1 mg (1 mg  Intramuscular Given 07/21/22 1801)  LORazepam (ATIVAN) injection 1 mg (1 mg Intravenous Given 07/21/22 2203)  labetalol (NORMODYNE) 5 MG/ML injection (10 mg  Given 07/21/22 2039)    ED Course/ Medical Decision Making/ A&P Clinical Course as of 07/21/22 2327  Sun Jul 21, 2022  2004 CBC(!) White blood cell count increased compared to previous [JK]  2153 Metabolic panel shows slightly elevated creatinine. [JK]  2153 CT scan shows evidence of intraparenchymal cerebral hemorrhage.  Case was discussed with Dr. Luberta Mutter.  He will see the patient [JK]    Clinical Course User Index [JK] Linwood Dibbles, MD                             Medical Decision Making Problems Addressed: Cerebral hemorrhage [facility]): acute illness or injury that poses a threat to life or bodily functions  Amount and/or Complexity of Data Reviewed Labs: ordered. Decision-making details documented in ED Course. Radiology: ordered and independent interpretation performed.  Risk Prescription drug management. Decision regarding hospitalization.   Patient presented to the ED for evaluation of altered mental status.  Patient has been having issues with frequent falls.  He was seen earlier in the ED today and was noted to have a subdural hygroma but no evidence of acute hemorrhage.  Family states however that his mental status was acutely changed.  Normally is conversant and in the  ED the patient was very agitated.  Patient CBC and metabolic panel did not show any acute abnormalities to account for his mental status changes.  Patient CT scan however did show a new cerebral hemorrhage.  Unclear if this was traumatic in nature as he has a parenchymal hemorrhage.  I reviewed all the findings with the patient's family.  Family does not want any surgical intervention but he is not ready for comfort care at this time.  I consulted with Dr. Derry Lory neurology.  Patient was started on blood pressure control labetalol and Cleviprex.  Patient will be admitted to the neuro ICU for close monitoring        Final Clinical Impression(s) / ED Diagnoses Final diagnoses:  Cerebral hemorrhage [facility])    Rx / DC Orders ED Discharge Orders     None         Linwood Dibbles, MD 07/21/22 2327

## 2022-07-21 NOTE — ED Notes (Signed)
Pt removed C-collar 

## 2022-07-21 NOTE — Discharge Instructions (Signed)
Return to the ER for worsening or concerning symptoms if needed.  Doxycyline as prescribed for pneumonia on chest CT.

## 2022-07-22 ENCOUNTER — Inpatient Hospital Stay (HOSPITAL_COMMUNITY): Payer: Medicare Other

## 2022-07-22 DIAGNOSIS — I610 Nontraumatic intracerebral hemorrhage in hemisphere, subcortical: Secondary | ICD-10-CM | POA: Diagnosis not present

## 2022-07-22 DIAGNOSIS — I619 Nontraumatic intracerebral hemorrhage, unspecified: Secondary | ICD-10-CM

## 2022-07-22 LAB — MRSA NEXT GEN BY PCR, NASAL: MRSA by PCR Next Gen: DETECTED — AB

## 2022-07-22 LAB — TRIGLYCERIDES: Triglycerides: 79 mg/dL (ref <150)

## 2022-07-22 MED ORDER — HALOPERIDOL LACTATE 5 MG/ML IJ SOLN
5.0000 mg | Freq: Four times a day (QID) | INTRAMUSCULAR | Status: DC | PRN
  Administered 2022-07-22 (×2): 5 mg via INTRAVENOUS
  Filled 2022-07-22: qty 1

## 2022-07-22 MED ORDER — GLYCOPYRROLATE 0.2 MG/ML IJ SOLN
0.2000 mg | INTRAMUSCULAR | Status: DC | PRN
  Administered 2022-07-22: 0.2 mg via INTRAVENOUS
  Filled 2022-07-22: qty 1

## 2022-07-22 MED ORDER — GLYCOPYRROLATE 0.2 MG/ML IJ SOLN: 0.2000 mg | INTRAMUSCULAR | Status: DC | PRN

## 2022-07-22 MED ORDER — MORPHINE SULFATE (PF) 2 MG/ML IV SOLN
2.0000 mg | INTRAVENOUS | Status: DC | PRN
  Administered 2022-07-22: 4 mg via INTRAVENOUS
  Administered 2022-07-22: 2 mg via INTRAVENOUS
  Filled 2022-07-22: qty 1

## 2022-07-22 MED ORDER — MIDAZOLAM HCL 2 MG/2ML IJ SOLN
2.0000 mg | INTRAMUSCULAR | Status: DC | PRN
  Administered 2022-07-22 (×2): 2 mg via INTRAVENOUS
  Filled 2022-07-22 (×2): qty 2

## 2022-07-22 MED ORDER — SODIUM CHLORIDE 0.9 % IV SOLN: INTRAVENOUS | Status: DC

## 2022-07-22 MED ORDER — CHLORHEXIDINE GLUCONATE CLOTH 2 % EX PADS: 6.0000 | MEDICATED_PAD | Freq: Every day | CUTANEOUS | Status: DC

## 2022-07-22 MED ORDER — GLYCOPYRROLATE 1 MG PO TABS: 1.0000 mg | ORAL_TABLET | ORAL | Status: DC | PRN

## 2022-07-22 MED ORDER — LABETALOL HCL 5 MG/ML IV SOLN: 20.0000 mg | INTRAVENOUS | Status: DC | PRN

## 2022-07-22 MED ORDER — ORAL CARE MOUTH RINSE: 15.0000 mL | OROMUCOSAL | Status: DC | PRN

## 2022-07-22 MED ORDER — MUPIROCIN 2 % EX OINT
1.0000 | TOPICAL_OINTMENT | Freq: Two times a day (BID) | CUTANEOUS | Status: DC
  Administered 2022-07-22 (×2): 1 via NASAL
  Filled 2022-07-22: qty 22

## 2022-07-22 MED ORDER — POLYVINYL ALCOHOL 1.4 % OP SOLN: 1.0000 [drp] | Freq: Four times a day (QID) | OPHTHALMIC | Status: DC | PRN

## 2022-07-22 NOTE — Progress Notes (Signed)
OT Cancellation Note  Patient Details Name: Jacob Becker MRN: [id number] DOB: Aug 04, 1929   Cancelled Treatment:    Reason Eval/Treat Not Completed: Active bedrest order OT order received and appreciated however this conflicts with current bedrest order set. Please increase activity tolerance as appropriate and remove bedrest from orders. . Please contact OT at [phone] if bed rest order is discontinued. OT will hold evaluation at this time and will check back as time allows pending increased activity orders.   Mateo Flow 07/22/2022, 7:12 AM

## 2022-07-22 NOTE — TOC Transition Note (Signed)
Transition of Care [facility]) - CM/SW Discharge Note   Patient Details  Name: Jacob Becker MRN: [id number] Date of Birth: 20-Jul-1929  Transition of Care [facility]) CM/SW Contact:  Mearl Latin, LCSW Phone Number: 07/22/2022, 4:44 PM   Clinical Narrative:    Patient will DC to: [facility] Anticipated DC date: 07/22/22 Family notified: Daughter Transport by: Sharin Mons   Per MD patient ready for DC to [facility]. RN to call report prior to discharge ([phone]). RN, patient, patient's family, and facility notified of DC. Discharge Summary sent to facility. DC packet on chart including signed DNR. Ambulance transport requested for patient.   CSW will sign off for now as social work intervention is no longer needed. Please consult [place] again if new needs arise.     Final next level of care: Hospice Medical Facility Barriers to Discharge: No Barriers Identified   Patient Goals and CMS Choice CMS Medicare.gov Compare Post Acute Care list provided to:: Patient Represenative (must comment) Choice offered to / list presented to : [facility] POA / Guardian  Discharge Placement                Patient chooses bed at:  [facility]) Patient to be transferred to facility by: [facility] Name of family member notified: Dondra Spry Patient and family notified of of transfer: 07/22/22  Discharge Plan and Services Additional resources added to the After Visit Summary for   In-house Referral: Clinical Social Work   Post Acute Care Choice: Hospice                               Social Determinants of Health (SDOH) Interventions SDOH Screenings   Tobacco Use: Unknown (07/21/2022)     Readmission Risk Interventions    07/22/2022    1:45 PM  Readmission Risk Prevention Plan  Transportation Screening Complete  Medication Review (RN Care Manager) Complete  PCP or Specialist appointment within 3-5 days of discharge Complete  HRI or Home Care Consult Complete  SW Recovery Care/Counseling  Consult Complete  Palliative Care Screening Complete  Skilled Nursing Facility Complete

## 2022-07-22 NOTE — Consult Note (Signed)
WOC Nurse Consult Note: Reason for Consult:Partial and full thickness areas of skin loss, the most significant are at the bilateral elbows and the right knee. Patient's family in the room and acknowledge that patient "picks at" or scratches skin. Patient has had numerous falls. Wound type:trauma, skin tear vs self-imposed skin injuries. Pressure Injury POA: N/A Measurement: Right elbow:Two oval partial thickness skin injuries, the largest of which measures 2.5cm x 2cm x 0.1cm pink, moist wound bed, scant serous drainage Left elbow: Full thickness lesion measuring 5cm x 1cm with depth unable to be determined due to the presence of dried serum (scab). No drainage Right knee: Three round areas of partial thickness skin loss, the largest of which measures 2.5cm round x 0.1cm. Pink, moist wound bed, scant serous drainage Wound bed:As noted above Drainage (amount, consistency, odor): As noted above Periwound: Intact, dry.  IT is noted that there are several areas of skin loss, both acute and chronic from previous falls. Dressing procedure/placement/frequency: Patient is on a mattress replacement with low air loss feature as he is in the ICU. He is being turned and repositioned, he is currently in restraints for safety (4-point).  He has silicone foam dressings on the bilateral heels.  Skin is friable. Even removal of the skin friendly silicone adhesives may cause trauma.A silicone foam dressing is to be applied to the sacrum as a PI prevention measure used in conjunction with turning and repositioning.  I have provided Nursing with guidance in the care of the below and right knee lesions using a daily, NS cleanse followed by covering the lesions with a folded layer of xeroform gauze and topping with dry gauze. Securement can either be with a few turns of Kerlix roll gauze/paper tape or with a silicone foam dressing.  WOC nursing team will not follow, but will remain available to this patient, the nursing and  medical teams.  Please re-consult if needed.  Thank you for inviting [place] to participate in this patient's Plan of Care.  Ladona Mow, MSN, RN, CNS, GNP, Leda Min, [employer], [employer] phone:  [phone]

## 2022-07-22 NOTE — Plan of Care (Signed)
  Problem: Safety: Goal: Non-violent Restraint(s) Outcome: Adequate for Discharge   Problem: Education: Goal: Knowledge of disease or condition will improve Outcome: Adequate for Discharge Goal: Knowledge of secondary prevention will improve (MUST DOCUMENT ALL) Outcome: Adequate for Discharge Goal: Knowledge of patient specific risk factors will improve Loraine Leriche N/A or DELETE if not current risk factor) Outcome: Adequate for Discharge   Problem: Intracerebral Hemorrhage Tissue Perfusion: Goal: Complications of Intracerebral Hemorrhage will be minimized Outcome: Adequate for Discharge   Problem: Coping: Goal: Will verbalize positive feelings about self Outcome: Adequate for Discharge Goal: Will identify appropriate support needs Outcome: Adequate for Discharge   Problem: Health Behavior/Discharge Planning: Goal: Ability to manage health-related needs will improve Outcome: Adequate for Discharge Goal: Goals will be collaboratively established with patient/family Outcome: Adequate for Discharge   Problem: Self-Care: Goal: Ability to participate in self-care as condition permits will improve Outcome: Adequate for Discharge Goal: Verbalization of feelings and concerns over difficulty with self-care will improve Outcome: Adequate for Discharge Goal: Ability to communicate needs accurately will improve Outcome: Adequate for Discharge   Problem: Nutrition: Goal: Risk of aspiration will decrease Outcome: Adequate for Discharge Goal: Dietary intake will improve Outcome: Adequate for Discharge   Problem: Health Behavior/Discharge Planning: Goal: Ability to manage health-related needs will improve Outcome: Adequate for Discharge   Problem: Health Behavior/Discharge Planning: Goal: Ability to manage health-related needs will improve Outcome: Adequate for Discharge

## 2022-07-22 NOTE — Progress Notes (Addendum)
PT Cancellation Note  Patient Details Name: Jacob Becker MRN: [id number] DOB: 03/26/1929   Cancelled Treatment:    Reason Eval/Treat Not Completed: Active bedrest order remains this morning that is not set to expire until 22:45 today. PT will continue to follow but hold on initial evaluation until after bedrest expires or until otherwise directed by MD.   14:32 Addendum - noted decision from family to pursue comfort care and d/c to [facility]. PT will sign off at this time.   Vickki Muff, PT, DPT   Acute Rehabilitation Department Office [phone] Secure Chat Communication Preferred  Ronnie Derby 07/22/2022, 7:39 AM

## 2022-07-22 NOTE — Evaluation (Signed)
Clinical/Bedside Swallow Evaluation Patient Details  Name: Jacob Becker MRN: [id number] Date of Birth: 1929-08-16  Today's Date: 07/22/2022 Time: SLP Start Time (ACUTE ONLY): 1100 SLP Stop Time (ACUTE ONLY): 1115 SLP Time Calculation (min) (ACUTE ONLY): 15 min  Past Medical History:  Past Medical History:  Diagnosis Date   AF (atrial fibrillation) (HCC)    Arthritis    Cancer (HCC)    prostate   Dysrhythmia    af   Hyperlipemia    Hypertension    Metastatic bone cancer    from prostate   Wears glasses    Past Surgical History:  Past Surgical History:  Procedure Laterality Date   CHEILECTOMY Right 10/07/2013   Procedure: RIGHT HALLUX CHEILECTOMY;  Surgeon: Toni Arthurs, MD;  Location: [facility];  Service: Orthopedics;  Laterality: Right;   COLONOSCOPY     CYSTOSCOPY PROSTATE W/ LASER  2012   turp   HERNIA REPAIR     rt and lt ing   KNEE ARTHROSCOPY  2005   right   TONSILLECTOMY     HPI:  Jacob Becker is a 89 y.o. male with PMH significant for afibb but not on AC due to multiple falls, advanced dementia and lives in a SNF, HTN, HLD, Cad, prostrate cancer in remission who presents with confusion and fall. Has been having multiple falls recently. This AM was seen in the ED for a fall but was talkative and did have periods of lucidity. Was sent again to the ED for a significant decline, confused, altered, agitated in the afternoon. Did have a fall today at SNF too. He had CT Head for this decline which demonstrated a left temporal lobe hemorrhage.    Assessment / Plan / Recommendation  Clinical Impression  Pt in agitated distress at the time of SLP arrival. He was fighting restraints. visually not attentive to environment, not responsive to verbal interactions with SLP. SLP and RN attempted to reposition and loosen retraints for assessment, but pt too inattentive to use hands functionally with PO and restraints put back on for pt and staff safety. SLP gave  verbal cues and touched teaspoons of nectar thick juice and puree to pts lips. Occasionally he responded and bit the spoon but released it and ate the puree. On final attempt pt bit the spoon and cracked it making it unsafe to continue. Pt does appear to have intact swallowing, but his attention is so impaired he is not eating and drinking. I will initiate purees and thin liquids in case this improves and pt can be offered something when cooperative. SLP Visit Diagnosis: Dysphagia, unspecified (R13.10)    Aspiration Risk  Risk for inadequate nutrition/hydration    Diet Recommendation Dysphagia 1 (Puree);Thin liquid   Liquid Administration via: Cup;Straw Medication Administration: Crushed with puree Compensations: Slow rate;Small sips/bites;Minimize environmental distractions Postural Changes: Seated upright at 90 degrees    Other  Recommendations Oral Care Recommendations: Oral care BID Caregiver Recommendations: Have oral suction available    Recommendations for follow up therapy are one component of a multi-disciplinary discharge planning process, led by the attending physician.  Recommendations may be updated based on patient status, additional functional criteria and insurance authorization.  Follow up Recommendations Skilled nursing-short term rehab (<3 hours/day)      Assistance Recommended at Discharge    Functional Status Assessment Patient has had a recent decline in their functional status and demonstrates the ability to make significant improvements in function in a reasonable and predictable amount  of time.  Frequency and Duration min 2x/week  2 weeks       Prognosis        Swallow Study   General HPI: Jacob Becker is a 89 y.o. male with PMH significant for afibb but not on AC due to multiple falls, advanced dementia and lives in a SNF, HTN, HLD, Cad, prostrate cancer in remission who presents with confusion and fall. Has been having multiple falls recently. This AM  was seen in the ED for a fall but was talkative and did have periods of lucidity. Was sent again to the ED for a significant decline, confused, altered, agitated in the afternoon. Did have a fall today at SNF too. He had CT Head for this decline which demonstrated a left temporal lobe hemorrhage. Type of Study: Bedside Swallow Evaluation Diet Prior to this Study: NPO Temperature Spikes Noted: No Respiratory Status: Room air History of Recent Intubation: No Behavior/Cognition: Alert;Doesn't follow directions;Agitated;Confused Self-Feeding Abilities: Total assist Patient Positioning: Postural control interferes with function Baseline Vocal Quality: Normal Volitional Cough: Cognitively unable to elicit Volitional Swallow: Unable to elicit    Oral/Motor/Sensory Function Overall Oral Motor/Sensory Function: Within functional limits   Ice Chips     Thin Liquid Thin Liquid: Impaired Presentation: Straw Oral Phase Impairments: Poor awareness of bolus    Nectar Thick Nectar Thick Liquid: Impaired Presentation: Spoon Oral Phase Impairments: Poor awareness of bolus   Honey Thick Honey Thick Liquid: Not tested   Puree Puree: Impaired Presentation: Spoon Oral Phase Impairments: Poor awareness of bolus   Solid     Solid: Not tested      Anikin Prosser, Riley Nearing 07/22/2022,12:08 PM

## 2022-07-22 NOTE — Discharge Summary (Addendum)
Stroke Discharge Summary  Patient ID: Jacob Becker   MRN: [id number]      DOB: 10/02/1929  Date of Admission: 07/21/2022 Date of Discharge: 07/22/2022  Attending Physician:  Marvel Plan MD Consultant(s):     Hospice   Patient's PCP:  Patient, No Pcp Per  DISCHARGE DIAGNOSIS:    Left temporal ICH - traumatic vs. spontaneous  Secondary Problem: Advanced dementia  Afib not on AC HTN HLD CAD Prostate cancer    Allergies as of 07/22/2022   No Known Allergies      Medication List     STOP taking these medications    acetaminophen 500 MG tablet Commonly known as: TYLENOL   amLODipine 10 MG tablet Commonly known as: NORVASC   Baza Protect Moisture Barrier 12 % Crea Generic drug: Zinc Oxide   Calcium Citrate 250 MG Tabs   clonazePAM 0.5 MG tablet Commonly known as: KLONOPIN   colesevelam 625 MG tablet Commonly known as: WELCHOL   CoQ10 100 MG Caps   docusate sodium 100 MG capsule Commonly known as: COLACE   doxycycline 100 MG capsule Commonly known as: VIBRAMYCIN   Ensure   ferrous sulfate 325 (65 FE) MG tablet   FISH OIL PO   loperamide 2 MG tablet Commonly known as: IMODIUM A-D   melatonin 5 MG Tabs   methocarbamol 500 MG tablet Commonly known as: ROBAXIN   metoprolol succinate 100 MG 24 hr tablet Commonly known as: TOPROL-XL   metoprolol succinate 50 MG 24 hr tablet Commonly known as: TOPROL-XL   nitroGLYCERIN 0.4 MG SL tablet Commonly known as: Nitrostat   polyethylene glycol 17 g packet Commonly known as: MIRALAX / GLYCOLAX   QUEtiapine 25 MG tablet Commonly known as: SEROQUEL   ramipril 10 MG capsule Commonly known as: ALTACE   rosuvastatin 40 MG tablet Commonly known as: CRESTOR   sertraline 25 MG tablet Commonly known as: ZOLOFT   traMADol 50 MG tablet Commonly known as: ULTRAM   traZODone 50 MG tablet Commonly known as: DESYREL   triamterene-hydrochlorothiazide 37.5-25 MG capsule Commonly known as: DYAZIDE    vitamin C 250 MG tablet Commonly known as: ASCORBIC ACID        LABORATORY STUDIES CBC    Component Value Date/Time   WBC 14.2 (H) 07/21/2022 1939   RBC 3.07 (L) 07/21/2022 1939   HGB 8.8 (L) 07/21/2022 1939   HCT 27.8 (L) 07/21/2022 1939   PLT 206 07/21/2022 1939   MCV 90.6 07/21/2022 1939   MCH 28.7 07/21/2022 1939   MCHC 31.7 07/21/2022 1939   RDW 19.7 (H) 07/21/2022 1939   LYMPHSABS 0.8 07/21/2022 0203   MONOABS 0.6 07/21/2022 0203   EOSABS 0.0 07/21/2022 0203   BASOSABS 0.0 07/21/2022 0203   CMP    Component Value Date/Time   NA 136 07/21/2022 1939   NA 138 11/05/2018 0739   K 3.7 07/21/2022 1939   CL 106 07/21/2022 1939   CO2 19 (L) 07/21/2022 1939   GLUCOSE 162 (H) 07/21/2022 1939   BUN 31 (H) 07/21/2022 1939   BUN 29 (H) 11/05/2018 0739   CREATININE 1.59 (H) 07/21/2022 1939   CREATININE 1.07 02/01/2022 1045   CALCIUM 8.1 (L) 07/21/2022 1939   PROT 6.5 07/21/2022 0203   PROT 7.1 11/05/2018 0739   ALBUMIN 2.5 (L) 07/21/2022 0203   ALBUMIN 4.1 11/05/2018 0739   AST 47 (H) 07/21/2022 0203   ALT 31 07/21/2022 0203   ALKPHOS 257 (H) 07/21/2022 0203  BILITOT 0.7 07/21/2022 0203   BILITOT 0.8 11/05/2018 0739   GFRNONAA 41 (L) 07/21/2022 1939   GFRAA 66 11/05/2018 0739   COAGS Lab Results  Component Value Date   INR 1.9 (H) 12/28/2021   Lipid Panel    Component Value Date/Time   CHOL 152 11/05/2018 0739   TRIG 79 07/22/2022 1141   HDL 63 11/05/2018 0739   CHOLHDL 2.4 11/05/2018 0739   CHOLHDL 3 09/03/2013 0749   VLDL 17.4 09/03/2013 0749   LDLCALC 75 11/05/2018 0739   HgbA1C No results found for: "HGBA1C" Urinalysis    Component Value Date/Time   COLORURINE YELLOW 07/21/2022 0139   APPEARANCEUR CLEAR 07/21/2022 0139   LABSPEC 1.009 07/21/2022 0139   PHURINE 7.0 07/21/2022 0139   GLUCOSEU NEGATIVE 07/21/2022 0139   HGBUR NEGATIVE 07/21/2022 0139   BILIRUBINUR NEGATIVE 07/21/2022 0139   KETONESUR NEGATIVE 07/21/2022 0139   PROTEINUR 100  (A) 07/21/2022 0139   NITRITE NEGATIVE 07/21/2022 0139   LEUKOCYTESUR NEGATIVE 07/21/2022 0139    SIGNIFICANT DIAGNOSTIC STUDIES CT HEAD WO CONTRAST  Result Date: 07/22/2022 CLINICAL DATA:  ICH follow-up EXAM: CT HEAD WITHOUT CONTRAST TECHNIQUE: Contiguous axial images were obtained from the base of the skull through the vertex without intravenous contrast. RADIATION DOSE REDUCTION: This exam was performed according to the departmental dose-optimization program which includes automated exposure control, adjustment of the mA and/or kV according to patient size and/or use of iterative reconstruction technique. COMPARISON:  None Available. FINDINGS: Brain: Unchanged left temporal intraparenchymal hemorrhage measuring 4.2 x 2.9 x 2.5 cm (AP x ML x CC; series 3/image 17 and series 5/image 42). This is unchanged from 07/21/2022 at 8:13 p.m. using similar measuring technique. Similar adjacent vasogenic edema and loss of sulcation. Otherwise no significant mass effect. No midline shift. Similar thickening of the posterior falx compatible with layering subdural hemorrhage. Bilateral predominantly low-density subdural collections along both cerebral convexities measuring up to 9 mm in thickness, unchanged from 07/21/2022 at 4:34 a.m. No evidence of acute infarct. No hydrocephalus. Generalized cerebral atrophy. Ill-defined hypoattenuation within the cerebral white matter is nonspecific but consistent with chronic small vessel ischemic disease. Vascular: No hyperdense vessel. Intracranial arterial calcification. Skull: No fracture or focal lesion. Left parietal scalp swelling. Scalp soft tissue nodules are unchanged. Sinuses/Orbits: No acute finding. Paranasal sinuses and mastoid air cells are well aerated. Other: None. IMPRESSION: 1. Unchanged left temporal intraparenchymal hemorrhage with adjacent vasogenic edema and loss of sulcation. Otherwise no significant mass effect. 2. Unchanged layering subdural hemorrhage  along the posterior falx. 3. Unchanged bilateral predominantly low-density subdural collections measuring up to 9 mm in thickness. Electronically Signed   By: Minerva Fester M.D.   On: 07/22/2022 03:24   DG Chest Portable 1 View  Result Date: 07/21/2022 CLINICAL DATA:  Unwitnessed fall. EXAM: PORTABLE CHEST 1 VIEW COMPARISON:  July 17, 2022 FINDINGS: The cardiac silhouette is mildly enlarged and unchanged in size. Moderate to marked severity calcification of the aortic arch is noted. Stable appearing areas of moderate to marked severity airspace disease are seen within the bilateral lower lobes. No pleural effusion or pneumothorax is identified. Multilevel degenerative changes seen throughout the thoracic spine. IMPRESSION: Moderate to marked severity bilateral lower lobe airspace disease. Electronically Signed   By: Aram Candela M.D.   On: 07/21/2022 21:47   CT Cervical Spine Wo Contrast  Result Date: 07/21/2022 CLINICAL DATA:  Neck trauma. EXAM: CT CERVICAL SPINE WITHOUT CONTRAST TECHNIQUE: Multidetector CT imaging of the cervical spine was performed without  intravenous contrast. Multiplanar CT image reconstructions were also generated. RADIATION DOSE REDUCTION: This exam was performed according to the departmental dose-optimization program which includes automated exposure control, adjustment of the mA and/or kV according to patient size and/or use of iterative reconstruction technique. COMPARISON:  07/21/2022. FINDINGS: Alignment: There is mild anterolisthesis at C4-C5 and retrolisthesis at C5-C6 at C6-C7. There is anterolisthesis at C7-T1. Skull base and vertebrae: No acute fracture. Multifocal areas of sclerosis are noted throughout the cervical and thoracic spine as well as ribs. Soft tissues and spinal canal: No prevertebral fluid or swelling. No visible canal hematoma. Disc levels: Multilevel intervertebral disc space narrowing, degenerative endplate changes, and facet arthropathy. Upper  chest: There are small bilateral pleural effusions with atelectasis. Other: Carotid artery calcifications.  Aortic atherosclerosis. IMPRESSION: 1. No acute fracture. 2. Scattered sclerotic lesions throughout the bones, compatible with known osteoblastic metastasis. 3. Multilevel degenerative changes. 4. Small bilateral pleural effusions with atelectasis. Electronically Signed   By: Thornell Sartorius M.D.   On: 07/21/2022 20:36   CT Head Wo Contrast  Result Date: 07/21/2022 CLINICAL DATA:  Head trauma, moderate-severe EXAM: CT HEAD WITHOUT CONTRAST TECHNIQUE: Contiguous axial images were obtained from the base of the skull through the vertex without intravenous contrast. RADIATION DOSE REDUCTION: This exam was performed according to the departmental dose-optimization program which includes automated exposure control, adjustment of the mA and/or kV according to patient size and/or use of iterative reconstruction technique. COMPARISON:  CT head 623 20:44 a.m. FINDINGS: Brain: Motion artifact. Cerebral ventricle sizes are concordant with the degree of cerebral volume loss. Patchy and confluent areas of decreased attenuation are noted throughout the deep and periventricular white matter of the cerebral hemispheres bilaterally, compatible with chronic microvascular ischemic disease. No evidence of large-territorial acute infarction. Interval development of a left temporal parenchymal hemorrhage involving an area of at least 5 x 3.5 cm. Possible superimposed subarachnoid hemorrhage. No mass lesion. No mass effect or midline shift. No hydrocephalus. Basilar cisterns are patent. Vascular: No hyperdense vessel. Skull: No acute fracture or focal lesion. Sinuses/Orbits: Paranasal sinuses and mastoid air cells are clear. Bilateral lens replacement. Otherwise the orbits are unremarkable. Other: None. IMPRESSION: Interval development of a left temporal parenchymal hemorrhage involving an area of at least 5 x 3.5 cm. Possible  superimposed subarachnoid hemorrhage. Slightly limited evaluation due to motion artifact. These results were called by telephone at the time of interpretation on 07/21/2022 at 8:27 pm to provider [facility] , who verbally acknowledged these results. Electronically Signed   By: Tish Frederickson M.D.   On: 07/21/2022 20:27   CT CHEST ABDOMEN PELVIS W CONTRAST  Result Date: 07/21/2022 CLINICAL DATA:  Found down.  Multiple bruises.  Head trauma. EXAM: CT CHEST, ABDOMEN, AND PELVIS WITH CONTRAST TECHNIQUE: Multidetector CT imaging of the chest, abdomen and pelvis was performed following the standard protocol during bolus administration of intravenous contrast. RADIATION DOSE REDUCTION: This exam was performed according to the departmental dose-optimization program which includes automated exposure control, adjustment of the mA and/or kV according to patient size and/or use of iterative reconstruction technique. CONTRAST:  75mL OMNIPAQUE IOHEXOL 350 MG/ML SOLN COMPARISON:  07/17/2022 chest CT. Chest abdomen pelvis CT 12/28/2021 FINDINGS: CT CHEST FINDINGS Cardiovascular: Heart is enlarged. Coronary artery calcification is evident. Mild atherosclerotic calcification is noted in the wall of the thoracic aorta. Mediastinum/Nodes: No mediastinal lymphadenopathy. There is no hilar lymphadenopathy. The esophagus has normal imaging features. There is no axillary lymphadenopathy. Lungs/Pleura: Collapse/consolidative opacity is seen in both lower  lobes, right greater than left with small bilateral pleural effusions. Musculoskeletal: As before there are multiple sclerotic bone lesions involving left clavicle, thoracic spine, and ribs. CT ABDOMEN PELVIS FINDINGS Hepatobiliary: Numerous lever lesions of varying size and attenuation including a dominant 11.6 cm lesion in the dome of the liver, similar to a study from 08/07/2016. Other smaller scattered lesions approach water attenuation and are likely cysts. There is no evidence for  gallstones, gallbladder wall thickening, or pericholecystic fluid. No intrahepatic or extrahepatic biliary dilation. Pancreas: Cystic lesion in the head of the pancreas measures 2.8 cm, increased from 1.6 cm previously. No main duct dilatation. Spleen: No splenomegaly. No suspicious focal mass lesion. Adrenals/Urinary Tract: No adrenal nodule or mass. Stable cyst interpolar left kidney. Tiny hypoattenuating lesion in the lateral right kidney is too small to characterize but likely benign. No followup imaging is recommended. No evidence for hydroureter. Foley catheter identified in the bladder. Gas in the bladder lumen is compatible with the instrumentation. Stomach/Bowel: Stomach is unremarkable. No gastric wall thickening. No evidence of outlet obstruction. Duodenum is normally positioned as is the ligament of Treitz. No small bowel wall thickening. No small bowel dilatation. No gross colonic mass. No colonic wall thickening. Vascular/Lymphatic: There is advanced atherosclerotic calcification of the abdominal aorta without aneurysm. There is no gastrohepatic or hepatoduodenal ligament lymphadenopathy. No retroperitoneal or mesenteric lymphadenopathy. No pelvic sidewall lymphadenopathy. Reproductive: The prostate gland and seminal vesicles are unremarkable. Other: No intraperitoneal free fluid. Musculoskeletal: Diffuse body wall edema evident. Widespread sclerotic bone lesions again noted. IMPRESSION: 1. No acute findings in the chest, abdomen, or pelvis. 2. Collapse/consolidative opacity in both lower lobes, right greater than left with small bilateral pleural effusions. Imaging features raise concern for pneumonia. 3. Numerous liver lesions of varying size and attenuation including a dominant 11.6 cm lesion in the dome of the liver, suspicious for soft tissue mass. Metastatic disease is a consideration. 4. Widespread sclerotic bone lesions compatible with metastatic disease. 5. 2.8 cm cystic lesion in the head of  the pancreas, increased from 1.6 cm previously. Attention on follow-up recommended 6.  Aortic Atherosclerosis (ICD10-I70.0). Electronically Signed   By: Kennith Center M.D.   On: 07/21/2022 05:32   CT Head Wo Contrast  Result Date: 07/21/2022 CLINICAL DATA:  Minor head trauma.  Unwitnessed fall EXAM: CT HEAD WITHOUT CONTRAST CT CERVICAL SPINE WITHOUT CONTRAST TECHNIQUE: Multidetector CT imaging of the head and cervical spine was performed following the standard protocol without intravenous contrast. Multiplanar CT image reconstructions of the cervical spine were also generated. RADIATION DOSE REDUCTION: This exam was performed according to the departmental dose-optimization program which includes automated exposure control, adjustment of the mA and/or kV according to patient size and/or use of iterative reconstruction technique. COMPARISON:  07/17/2022 FINDINGS: CT HEAD FINDINGS Brain: Low-density subdural collection along the bilateral cerebral convexity which are new from prior and measure up to 6 mm in thickness. No significant cerebral mass effect. Generalized cortical atrophy. No evidence of acute infarct, hydrocephalus, or mass. Vascular: No hyperdense vessel or unexpected calcification. Skull: Normal. Negative for fracture or focal lesion. Multiple scalp nodules without progression. Mild left parietal scalp swelling. Sinuses/Orbits: No evidence of injury CT CERVICAL SPINE FINDINGS Alignment: No traumatic malalignment Skull base and vertebrae: No acute fracture. Known osseous metastatic disease with dominant deposits in the C2, C7, T2, T3, and T4 vertebrae. Partially covered rib lesions as well. Soft tissues and spinal canal: No prevertebral fluid or swelling. No visible canal hematoma. Disc levels: Generalized degenerative  endplate and facet spurring. Facet ankylosis has occurred at C2-C5. Upper chest: Clear apical lungs IMPRESSION: 1. Bilateral subdural hygroma since scan 4 days ago measuring up to 6 mm  in thickness. No worrisome mass effect. 2. Negative for cervical spine fracture or subluxation. 3. Known osseous metastatic disease. Electronically Signed   By: Tiburcio Pea M.D.   On: 07/21/2022 05:13   CT Cervical Spine Wo Contrast  Result Date: 07/21/2022 CLINICAL DATA:  Minor head trauma.  Unwitnessed fall EXAM: CT HEAD WITHOUT CONTRAST CT CERVICAL SPINE WITHOUT CONTRAST TECHNIQUE: Multidetector CT imaging of the head and cervical spine was performed following the standard protocol without intravenous contrast. Multiplanar CT image reconstructions of the cervical spine were also generated. RADIATION DOSE REDUCTION: This exam was performed according to the departmental dose-optimization program which includes automated exposure control, adjustment of the mA and/or kV according to patient size and/or use of iterative reconstruction technique. COMPARISON:  07/17/2022 FINDINGS: CT HEAD FINDINGS Brain: Low-density subdural collection along the bilateral cerebral convexity which are new from prior and measure up to 6 mm in thickness. No significant cerebral mass effect. Generalized cortical atrophy. No evidence of acute infarct, hydrocephalus, or mass. Vascular: No hyperdense vessel or unexpected calcification. Skull: Normal. Negative for fracture or focal lesion. Multiple scalp nodules without progression. Mild left parietal scalp swelling. Sinuses/Orbits: No evidence of injury CT CERVICAL SPINE FINDINGS Alignment: No traumatic malalignment Skull base and vertebrae: No acute fracture. Known osseous metastatic disease with dominant deposits in the C2, C7, T2, T3, and T4 vertebrae. Partially covered rib lesions as well. Soft tissues and spinal canal: No prevertebral fluid or swelling. No visible canal hematoma. Disc levels: Generalized degenerative endplate and facet spurring. Facet ankylosis has occurred at C2-C5. Upper chest: Clear apical lungs IMPRESSION: 1. Bilateral subdural hygroma since scan 4 days ago  measuring up to 6 mm in thickness. No worrisome mass effect. 2. Negative for cervical spine fracture or subluxation. 3. Known osseous metastatic disease. Electronically Signed   By: Tiburcio Pea M.D.   On: 07/21/2022 05:13   CT Chest W Contrast  Result Date: 07/17/2022 CLINICAL DATA:  Abscess; * Tracking Code: BO * EXAM: CT CHEST WITH CONTRAST TECHNIQUE: Multidetector CT imaging of the chest was performed during intravenous contrast administration. RADIATION DOSE REDUCTION: This exam was performed according to the departmental dose-optimization program which includes automated exposure control, adjustment of the mA and/or kV according to patient size and/or use of iterative reconstruction technique. CONTRAST:  60mL OMNIPAQUE IOHEXOL 350 MG/ML SOLN COMPARISON:  Chest x-ray dated July 17, 2022; chest CT dated December 28, 2021 FINDINGS: Cardiovascular: Mild cardiomegaly. No pericardial effusion. Moderate aortic valve calcifications. Normal caliber thoracic aorta with moderate atherosclerotic disease. No suspicious filling defects of the central pulmonary arteries. Dilated main pulmonary artery, measuring up to 3.7 cm, findings can be seen in the setting of pulmonary hypertension. Severe left main and three-vessel coronary artery calcifications. Mediastinum/Nodes: Esophagus and thyroid are unremarkable. Mildly enlarged bilateral hilar lymph nodes. Reference left hilar lymph node measuring 12 mm in short axis on series 3, image 86. Lungs/Pleura: Central airways are patent. Small bilateral loculated pleural effusions. Bilateral lower lung ground-glass opacities. Upper Abdomen: Numerous irregular liver lesions are unchanged when compared with the prior exam. Largest is a heterogeneous lesion of the hepatic dome measuring 10.8 x 8.9 cm on series 3, image 144, unchanged when remeasured in similar plane. Partially visualized low-attenuation renal lesions which are likely simple cysts, no specific follow-up imaging is  necessary for  these lesions. Musculoskeletal: Numerous sclerotic osseous lesions involving the thoracic spine and bilateral ribs, several are new when compared with the prior exam. Reference sclerotic lesion of the T7 vertebral body, new when compared with the prior. IMPRESSION: 1. Small bilateral loculated pleural effusions. Cavitary lesion described on prior radiograph due to loculated fissural fluid. 2. Bilateral lower lobe ground-glass opacities, likely atelectasis, although infection or aspiration could appear similar. 3. Unchanged hepatic metastatic disease. 4. Numerous sclerotic osseous lesions, several are new when compared with prior, concerning for progressive osseous metastatic disease. Electronically Signed   By: Allegra Lai M.D.   On: 07/17/2022 08:27   DG Elbow Complete Left  Result Date: 07/17/2022 CLINICAL DATA:  Status post fall, left elbow pain EXAM: LEFT ELBOW - COMPLETE 3+ VIEW COMPARISON:  None Available. FINDINGS: There is no evidence of fracture, dislocation, or joint effusion. There is no evidence of arthropathy or other focal bone abnormality. Soft tissues are unremarkable. IMPRESSION: Negative. Electronically Signed   By: Elige Ko M.D.   On: 07/17/2022 07:57   DG Hip Unilat W or Wo Pelvis 2-3 Views Right  Result Date: 07/17/2022 CLINICAL DATA:  Status post fall, hip pain EXAM: DG HIP (WITH OR WITHOUT PELVIS) 2-3V RIGHT COMPARISON:  CT abdomen 02/25/2019 FINDINGS: Generalized osteopenia. No acute fracture or dislocation. Mild joint space narrowing of the right hip. Moderate joint space narrowing of the left hip. Sclerotic bone lesion involving the right and left ilium concerning for metastatic disease given the patient's history of prostate cancer. Normal alignment. Increased soft tissue density overlying the right gluteal musculature concerning for a soft tissue hematoma given the history of trauma. No radiopaque foreign body or soft tissue emphysema. IMPRESSION: 1. No  acute osseous injury of the right hip. Given the patient's age and osteopenia, if there is persistent clinical concern for an occult hip fracture, a MRI of the hip is recommended for increased sensitivity. 2. Sclerotic bone lesion involving the right and left ilium concerning for metastatic disease given the patient's history of prostate cancer. Correlate with PSA. Electronically Signed   By: Elige Ko M.D.   On: 07/17/2022 07:56   CT HEAD WO CONTRAST ( )  Result Date: 07/17/2022 CLINICAL DATA:  Minor head trauma EXAM: CT HEAD WITHOUT CONTRAST CT CERVICAL SPINE WITHOUT CONTRAST TECHNIQUE: Multidetector CT imaging of the head and cervical spine was performed following the standard protocol without intravenous contrast. Multiplanar CT image reconstructions of the cervical spine were also generated. RADIATION DOSE REDUCTION: This exam was performed according to the departmental dose-optimization program which includes automated exposure control, adjustment of the mA and/or kV according to patient size and/or use of iterative reconstruction technique. COMPARISON:  06/13/2022 FINDINGS: CT HEAD FINDINGS Brain: No evidence of acute infarction, hemorrhage, hydrocephalus, extra-axial collection or mass lesion/mass effect. Generalized atrophy. Moderate chronic small vessel ischemia in the cerebral white matter Vascular: No hyperdense vessel or unexpected calcification. Skull: No acute fracture. Sinuses/Orbits: Evidence of injury Other: Scalp nodules bilaterally with the largest at the right paramedian vertex measuring up to 19 mm. A right parietal scalp nodule is seen on prior and has enlarged to 13 mm. CT CERVICAL SPINE FINDINGS Alignment: No traumatic malalignment. C4-5 and C7-T1 degenerative anterolisthesis Skull base and vertebrae: No acute fracture. Sclerosis especially in the C2, C7, and T2 vertebrae consistent with history of osseous metastatic disease. Partially covered rib lesions as well. Soft tissues and  spinal canal: No prevertebral fluid or swelling. No visible canal hematoma. Disc levels: Extensive degenerative endplate  and facet spurring throughout the cervical spine. Upper chest: No evidence of injury IMPRESSION: 1. No evidence of acute intracranial or cervical spine injury. 2. Progressing scalp nodules when compared to May 2024, tumor not excluded. 3. Known osseous metastatic disease. Electronically Signed   By: Tiburcio Pea M.D.   On: 07/17/2022 06:48   CT Cervical Spine Wo Contrast  Result Date: 07/17/2022 CLINICAL DATA:  Minor head trauma EXAM: CT HEAD WITHOUT CONTRAST CT CERVICAL SPINE WITHOUT CONTRAST TECHNIQUE: Multidetector CT imaging of the head and cervical spine was performed following the standard protocol without intravenous contrast. Multiplanar CT image reconstructions of the cervical spine were also generated. RADIATION DOSE REDUCTION: This exam was performed according to the departmental dose-optimization program which includes automated exposure control, adjustment of the mA and/or kV according to patient size and/or use of iterative reconstruction technique. COMPARISON:  06/13/2022 FINDINGS: CT HEAD FINDINGS Brain: No evidence of acute infarction, hemorrhage, hydrocephalus, extra-axial collection or mass lesion/mass effect. Generalized atrophy. Moderate chronic small vessel ischemia in the cerebral white matter Vascular: No hyperdense vessel or unexpected calcification. Skull: No acute fracture. Sinuses/Orbits: Evidence of injury Other: Scalp nodules bilaterally with the largest at the right paramedian vertex measuring up to 19 mm. A right parietal scalp nodule is seen on prior and has enlarged to 13 mm. CT CERVICAL SPINE FINDINGS Alignment: No traumatic malalignment. C4-5 and C7-T1 degenerative anterolisthesis Skull base and vertebrae: No acute fracture. Sclerosis especially in the C2, C7, and T2 vertebrae consistent with history of osseous metastatic disease. Partially covered rib  lesions as well. Soft tissues and spinal canal: No prevertebral fluid or swelling. No visible canal hematoma. Disc levels: Extensive degenerative endplate and facet spurring throughout the cervical spine. Upper chest: No evidence of injury IMPRESSION: 1. No evidence of acute intracranial or cervical spine injury. 2. Progressing scalp nodules when compared to May 2024, tumor not excluded. 3. Known osseous metastatic disease. Electronically Signed   By: Tiburcio Pea M.D.   On: 07/17/2022 06:48   DG Chest Portable 1 View  Result Date: 07/17/2022 CLINICAL DATA:  Fall. EXAM: PORTABLE CHEST 1 VIEW COMPARISON:  01/10/2022 FINDINGS: Focal consolidative masslike opacity identified in the medial right lower lung, possibly with central cavitation, stable to progressive in the interval. Patchy airspace disease noted left lung base. No evidence for pneumothorax or substantial pleural effusion. Cardiopericardial silhouette is at upper limits of normal for size. Bones are diffusely demineralized. Telemetry leads overlie the chest. IMPRESSION: 1. Stable to progressive masslike consolidative opacity in the medial right lower lung, possibly with central cavitation. Chest CT recommended to further evaluate. 2. Patchy airspace disease left lung base. 3. No evidence for pneumothorax or substantial pleural effusion. Electronically Signed   By: Kennith Center M.D.   On: 07/17/2022 06:39   DG Knee Complete 4 Views Right  Result Date: 07/17/2022 CLINICAL DATA:  Fall, rule out fracture. EXAM: RIGHT KNEE - COMPLETE 4+ VIEW COMPARISON:  None Available. FINDINGS: No evidence of fracture, dislocation, or joint effusion. Joint collapse and degenerative spurring. Subjective osteopenia. Senile chondrocalcinosis. Nonspecific in generalized subcutaneous reticulation. Atheromatous calcification in the lower leg and calf. IMPRESSION: 1. No acute finding. 2. Osteoarthritis with joint space narrowing greatest at the lateral compartment.  Electronically Signed   By: Tiburcio Pea M.D.   On: 07/17/2022 06:38   DG Elbow Complete Right  Result Date: 07/17/2022 CLINICAL DATA:  Fall, rule out elbow fracture EXAM: RIGHT ELBOW - COMPLETE 4 VIEW COMPARISON:  None Available. FINDINGS: There is  no evidence of fracture, dislocation, or joint effusion. There is no evidence of arthropathy or other focal bone abnormality. Soft tissues are unremarkable. IMPRESSION: Negative. Electronically Signed   By: Tiburcio Pea M.D.   On: 07/17/2022 06:37      HISTORY OF PRESENT ILLNESS Mr. DESMEN SCHOFFSTALL is a 89 y.o. male with history of afibb but not on AC due to multiple falls, advanced dementia and lives in a SNF, HTN, HLD, CAD, prostrate cancer in remission who presents with confusion and fall. Has been having multiple falls recently. On 6/23 he was seen in the ED for a fall but was talkative and did have periods of lucidity. Was sent again to the ED for a significant decline, confused, altered, agitated in the afternoon.  At baseline- walking independently up until 6/5 when he was transitioned to memory care when he was falling frequently. He is typically able to converse appropriately but does have an element of confusion/dementia.      HOSPITAL COURSE Intracerebral Hemorrhage:  left temporal hemorrhage  Etiology:  hypertension?  Code Stroke CT head Interval development of a left temporal parenchymal hemorrhage involving an area of at least 5 x 3.5 cm. Possible superimposed subarachnoid hemorrhage. Slightly limited evaluation due to motion artifact. Repeat Head CT - Unchanged left temporal intraparenchymal hemorrhage with adjacent vasogenic edema and loss of sulcation. Otherwise no significant mass effect. Unchanged layering subdural hemorrhage along the posterior falx. Unchanged bilateral predominantly low-density subdural collections measuring up to 9 mm in thickness. No antithrombotic prior to admission, now on No antithrombotic due to  ICH Therapy recommendations:  No follow up needed Disposition:  Hospice   Atrial fibrillation Home Meds: metoprolol Continue telemetry monitoring No anticoagulation at baseline due to numerous falls   Hypertension Home meds:  Norvasc Stable Now in comfort care  DISCHARGE EXAM Blood pressure 132/67, pulse 90, temperature 98.3 F (36.8 C), temperature source Oral, resp. rate (!) 34, height 6\' 2"  (1.88 m), weight 69.8 kg, SpO2 92 %. General:  agitated, restless Psych:  Mood and affect appropriate for situation CV: irregular rate and rhythm on monitor Respiratory:  Regular, unlabored respirations on room air GI: Abdomen soft and nontender     NEURO:  Mental Status: Restless, speech is incoherent, intangible. Moaning  Not following commands   Cranial Nerves:  PERRL. Eyes closed, when forced open, they are midline, does not track examiner bilaterally, not blinking to visual threat bilaterally, face appears symmetric,  cough and gag intact Motor: moves all extremities antigravity Tone: is normal and bulk is normal Sensation- Intact to light touch bilaterally.  Coordination: tremor noted in right upper extremity, unable to complete HKS  Discharge Diet       Diet   DIET - DYS 1 Room service appropriate? No; Fluid consistency: Thin   liquids  DISCHARGE PLAN Disposition:  Hospice Discharge to [facility] for hospice care Full DNR with comfort measures  35 minutes were spent preparing discharge.  Patient seen and examined by NP/APP with MD. MD to update note as needed.   Elmer Picker, DNP, FNP-BC [facility] Pager: [phone]   ATTENDING NOTE: I reviewed above note and agree with the assessment and plan. Pt was seen and examined.   Daughter and son in law are at the bedside. Pt eyes closed but intermittent agitation, restless, now in 4 point restrain due to strong movement and agitation. Per RN, pt is strong in all extremities. However pt not following  commands, no language output but  only moaning, not blinking to visual threat. CT showed left temporal ICH stable on repeat. I had long discussion with daughter and son in law at bedside, updated pt current condition, treatment plan and potential prognosis, and answered all the questions. They expressed understanding and appreciation. They understood how this event will have dramatic change for this pt already struggling with advanced dementia. They were ready to talk to palliative care and hospice and they eventually made decision for [facility].   For detailed assessment and plan, please refer to above/below as I have made changes wherever appropriate.   Marvel Plan, MD PhD Stroke Neurology 07/22/2022 6:08 PM

## 2022-07-22 NOTE — Progress Notes (Signed)
    Referral received for Jacob Becker :goals of care discussion. Chart reviewed and updates received from [facility], Doreatha Martin RN.   Patient's family have decided on transfer to [facility] for EOL comfort-focused care. No additional need for goals of care conversation at this time.  Thank you for your referral and allowing PMT to assist in Jacob Becker's care.   Richardson Dopp, [facility] Palliative Medicine Team  Team Phone # [phone]   NO CHARGE

## 2022-07-22 NOTE — TOC Initial Note (Signed)
Transition of Care [facility]) - Initial/Assessment Note    Patient Details  Name: Jacob Becker MRN: [id number] Date of Birth: March 26, 1929  Transition of [facility]) CM/SW Contact:    Mearl Latin, LCSW Phone Number: 07/22/2022, 1:46 PM  Clinical Narrative:                 CSW notified that [facility] has accepted patient at family's preference. CSW will assist in arranging transportation once consents are signed.   Expected Discharge Plan: Hospice Medical Facility Barriers to Discharge: No Barriers Identified   Patient Goals and CMS Choice Patient states their goals for this hospitalization and ongoing recovery are:: Comfort CMS Medicare.gov Compare Post Acute Care list provided to:: Patient Represenative (must comment) Choice offered to / list presented to : [facility] POA / Guardian [place] ownership interest in [facility].provided to:: [facility] POA / Guardian    Expected Discharge Plan and Services In-house Referral: Clinical Social Work   Post Acute Care Choice: Hospice Living arrangements for the past 2 months: Assisted Living Facility                                      Prior Living Arrangements/Services Living arrangements for the past 2 months: Assisted Living Facility Lives with:: Facility Resident Patient language and need for interpreter reviewed:: Yes Do you feel safe going back to the place where you live?: Yes      Need for Family Participation in Patient Care: Yes (Comment) Care giver support system in place?: Yes (comment)   Criminal Activity/Legal Involvement Pertinent to Current Situation/Hospitalization: No - Comment as needed  Activities of Daily Living      Permission Sought/Granted Permission sought to share information with : Facility Medical sales representative, Family Supports Permission granted to share information with : No  Share Information with NAME: Dondra Spry  Permission granted to share info w AGENCY: Hospice  Permission  granted to share info w Relationship: Daughter     Emotional Assessment Appearance:: Appears stated age Attitude/Demeanor/Rapport: Unable to Assess Affect (typically observed): Unable to Assess Orientation: :  (Disoriented x4) Alcohol / Substance Use: Not Applicable Psych Involvement: No (comment)  Admission diagnosis:  Cerebral hemorrhage (HCC) [I61.9] ICH (intracerebral hemorrhage) (HCC) [I61.9] Patient Active Problem List   Diagnosis Date Noted   ICH (intracerebral hemorrhage) (HCC) 07/21/2022   Olecranon bursitis of left elbow 01/18/2022   Medication monitoring encounter 01/18/2022   E. coli UTI 01/18/2022   Near syncope 12/28/2021   AKI (acute kidney injury) (HCC) 12/28/2021   Hemothorax on right 12/28/2021   Hyperkalemia 12/28/2021   A-fib (HCC) 12/28/2021   Diarrhea 12/05/2020   H/O abnormal weight loss 12/05/2020   H/O therapeutic radiation 12/05/2020   Intermittent self-catheterization of bladder 12/05/2020   Loss of appetite 12/05/2020   Mild cognitive impairment with memory loss 12/05/2020   Nocturia 12/05/2020   Anemia, iron deficiency 06/24/2017   AF (atrial fibrillation) (HCC) 07/07/2014   Coronary artery disease involving native heart 07/07/2014   Essential hypertension 07/07/2014   Chronic anticoagulation 07/07/2014   Hyperlipidemia 07/07/2014   Prostate cancer (HCC) 07/07/2014   Neurogenic bladder disorder 10/28/2011   Post-traumatic male urethral stricture 10/28/2011   Retention of urine 10/09/2011   PCP:  Patient, No Pcp Per Pharmacy:   Johny Sax (MAIL SERVICE) [facility] - [place], [place] - [address]  [place] [address] Phone: [phone] Fax: [phone]  [id number] [place], [place] - [address] AT [facility] [address] [facility] [address] [facility] [address] [place] [address] Phone: [phone] Fax: [phone]  [facility] - [place], [place] - [address] [place] [address] Phone: [phone] Fax: [phone]  [facility] [address] [place], [place] - [address] [place] [address] Phone: [phone] Fax: [phone]     Social Determinants of Health (SDOH) Social History: SDOH Screenings   Tobacco Use: Unknown (07/21/2022)   SDOH Interventions:     Readmission Risk Interventions    07/22/2022    1:45 PM  Readmission Risk Prevention Plan  Transportation Screening Complete  Medication Review (RN Care Manager) Complete  PCP or Specialist appointment within 3-5 days of discharge Complete  HRI or Home Care Consult Complete  SW Recovery Care/Counseling Consult Complete  Palliative Care Screening Complete  Skilled Nursing Facility Complete

## 2022-07-22 NOTE — Progress Notes (Signed)
eLink Physician-Brief Progress Note Patient Name: Jacob Becker DOB: [birth year] MRN: [id number]   Date of Service  07/22/2022  HPI/Events of Note  89/M with history of Afib, multiple falls, who was brought to the ED after a fall and subsequent changed in mental status. [place] revealed L temporal lobe hemorrhage.   HR 80, BP 134/73, RR 20, SpO2 93% on room air   eICU Interventions  - Admit to ICU - Serial neurochecks, plan for stat CT if with acute decline - No coagulopathy to reverse - BP control - would target SBP <148mmHg.  - Continue supportive care.  - Code status DNR/DNI        [name] 07/22/2022, 12:08 AM

## 2022-07-22 NOTE — Progress Notes (Signed)
[facility]) Hospital Liaison Note  Received referral from patient's daughter, Dondra Spry, regarding family interest in hospice services.  Met with Dondra Spry and her husband at bedside to discuss patient's multiple falls and decline in function over last few weeks.  After long discussion, Dondra Spry has elected to move forward with [facility] referral.  Patient's eligibility has been confirmed and family is agreeable with transfer to facility today.  RN--please call report to [facility] at any time to [phone].  Please leave in all IV's for ongoing symptom management needs.  Please send signed and completed DNR with patient at discharge.  Thank you for the opportunity to participate in this patient's care.  Doreatha Martin, RN, BSN [facility] Liaison [phone]

## 2022-07-26 DIAGNOSIS — N401 Enlarged prostate with lower urinary tract symptoms: Secondary | ICD-10-CM | POA: Diagnosis not present

## 2022-07-26 DIAGNOSIS — I119 Hypertensive heart disease without heart failure: Secondary | ICD-10-CM | POA: Diagnosis not present

## 2022-07-29 DEATH — deceased
